# Patient Record
Sex: Female | Born: 1955
Health system: Southern US, Community
[De-identification: ages and names within clinical notes are randomized; demographics above are authoritative.]

## PROBLEM LIST (undated history)

## (undated) DIAGNOSIS — F419 Anxiety disorder, unspecified: Secondary | ICD-10-CM

## (undated) DIAGNOSIS — I10 Essential (primary) hypertension: Secondary | ICD-10-CM

## (undated) DIAGNOSIS — K219 Gastro-esophageal reflux disease without esophagitis: Secondary | ICD-10-CM

## (undated) DIAGNOSIS — Z9889 Other specified postprocedural states: Secondary | ICD-10-CM

## (undated) DIAGNOSIS — E039 Hypothyroidism, unspecified: Secondary | ICD-10-CM

## (undated) DIAGNOSIS — L509 Urticaria, unspecified: Secondary | ICD-10-CM

## (undated) DIAGNOSIS — R112 Nausea with vomiting, unspecified: Secondary | ICD-10-CM

## (undated) DIAGNOSIS — M1990 Unspecified osteoarthritis, unspecified site: Secondary | ICD-10-CM

## (undated) DIAGNOSIS — F32A Depression, unspecified: Secondary | ICD-10-CM

## (undated) DIAGNOSIS — J069 Acute upper respiratory infection, unspecified: Secondary | ICD-10-CM

## (undated) DIAGNOSIS — F329 Major depressive disorder, single episode, unspecified: Secondary | ICD-10-CM

## (undated) HISTORY — PX: UPPER GASTROINTESTINAL ENDOSCOPY: SHX188

## (undated) HISTORY — DX: Acute upper respiratory infection, unspecified: J06.9

## (undated) HISTORY — DX: Urticaria, unspecified: L50.9

## (undated) HISTORY — PX: TONSILLECTOMY: SUR1361

## (undated) HISTORY — PX: VAGINAL HYSTERECTOMY: SUR661

## (undated) HISTORY — PX: TUBAL LIGATION: SHX77

## (undated) HISTORY — PX: CHOLECYSTECTOMY: SHX55

## (undated) HISTORY — PX: BACK SURGERY: SHX140

## (undated) HISTORY — PX: COLONOSCOPY: SHX174

## (undated) HISTORY — DX: Anxiety disorder, unspecified: F41.9

## (undated) HISTORY — DX: Hypothyroidism, unspecified: E03.9

---

## 2001-06-15 ENCOUNTER — Other Ambulatory Visit: Admission: RE | Admit: 2001-06-15 | Discharge: 2001-06-15 | Payer: Self-pay | Admitting: *Deleted

## 2001-07-04 ENCOUNTER — Encounter: Payer: Self-pay | Admitting: Family Medicine

## 2001-07-04 ENCOUNTER — Ambulatory Visit (HOSPITAL_COMMUNITY): Admission: RE | Admit: 2001-07-04 | Discharge: 2001-07-04 | Payer: Self-pay | Admitting: Family Medicine

## 2001-09-14 ENCOUNTER — Ambulatory Visit (HOSPITAL_COMMUNITY): Admission: RE | Admit: 2001-09-14 | Discharge: 2001-09-14 | Payer: Self-pay | Admitting: Family Medicine

## 2001-09-14 ENCOUNTER — Encounter: Payer: Self-pay | Admitting: Family Medicine

## 2002-01-09 ENCOUNTER — Ambulatory Visit (HOSPITAL_COMMUNITY): Admission: RE | Admit: 2002-01-09 | Discharge: 2002-01-09 | Payer: Self-pay | Admitting: Family Medicine

## 2002-01-09 ENCOUNTER — Encounter: Payer: Self-pay | Admitting: Family Medicine

## 2002-04-17 ENCOUNTER — Ambulatory Visit (HOSPITAL_COMMUNITY): Admission: RE | Admit: 2002-04-17 | Discharge: 2002-04-17 | Payer: Self-pay | Admitting: General Surgery

## 2002-07-19 ENCOUNTER — Encounter: Payer: Self-pay | Admitting: Obstetrics and Gynecology

## 2002-07-19 ENCOUNTER — Ambulatory Visit (HOSPITAL_COMMUNITY): Admission: RE | Admit: 2002-07-19 | Discharge: 2002-07-19 | Payer: Self-pay | Admitting: Obstetrics and Gynecology

## 2003-11-27 ENCOUNTER — Other Ambulatory Visit: Admission: RE | Admit: 2003-11-27 | Discharge: 2003-11-27 | Payer: Self-pay | Admitting: Family Medicine

## 2004-12-25 ENCOUNTER — Encounter (HOSPITAL_COMMUNITY): Admission: RE | Admit: 2004-12-25 | Discharge: 2004-12-26 | Payer: Self-pay | Admitting: Endocrinology

## 2005-04-30 ENCOUNTER — Ambulatory Visit (HOSPITAL_COMMUNITY): Admission: RE | Admit: 2005-04-30 | Discharge: 2005-04-30 | Payer: Self-pay | Admitting: Endocrinology

## 2006-01-13 ENCOUNTER — Ambulatory Visit: Payer: Self-pay | Admitting: Internal Medicine

## 2006-01-25 ENCOUNTER — Ambulatory Visit: Payer: Self-pay | Admitting: Internal Medicine

## 2006-02-04 ENCOUNTER — Ambulatory Visit (HOSPITAL_COMMUNITY): Admission: RE | Admit: 2006-02-04 | Discharge: 2006-02-04 | Payer: Self-pay | Admitting: Internal Medicine

## 2006-02-04 ENCOUNTER — Encounter: Payer: Self-pay | Admitting: Internal Medicine

## 2006-02-04 ENCOUNTER — Ambulatory Visit: Payer: Self-pay | Admitting: Internal Medicine

## 2006-02-25 ENCOUNTER — Ambulatory Visit: Payer: Self-pay | Admitting: Internal Medicine

## 2006-04-28 ENCOUNTER — Ambulatory Visit: Payer: Self-pay | Admitting: Internal Medicine

## 2006-08-18 ENCOUNTER — Emergency Department (HOSPITAL_COMMUNITY): Admission: EM | Admit: 2006-08-18 | Discharge: 2006-08-18 | Payer: Self-pay | Admitting: Emergency Medicine

## 2007-01-05 ENCOUNTER — Ambulatory Visit (HOSPITAL_COMMUNITY): Admission: RE | Admit: 2007-01-05 | Discharge: 2007-01-05 | Payer: Self-pay | Admitting: Family Medicine

## 2007-09-14 ENCOUNTER — Ambulatory Visit (HOSPITAL_COMMUNITY): Admission: RE | Admit: 2007-09-14 | Discharge: 2007-09-14 | Payer: Self-pay | Admitting: Family Medicine

## 2007-09-21 ENCOUNTER — Ambulatory Visit (HOSPITAL_COMMUNITY): Admission: RE | Admit: 2007-09-21 | Discharge: 2007-09-21 | Payer: Self-pay | Admitting: Family Medicine

## 2007-10-15 ENCOUNTER — Ambulatory Visit (HOSPITAL_COMMUNITY): Admission: RE | Admit: 2007-10-15 | Discharge: 2007-10-15 | Payer: Self-pay | Admitting: Family Medicine

## 2007-10-25 ENCOUNTER — Ambulatory Visit (HOSPITAL_COMMUNITY): Admission: RE | Admit: 2007-10-25 | Discharge: 2007-10-26 | Payer: Self-pay | Admitting: Neurosurgery

## 2008-07-26 ENCOUNTER — Emergency Department (HOSPITAL_COMMUNITY): Admission: EM | Admit: 2008-07-26 | Discharge: 2008-07-26 | Payer: Self-pay | Admitting: Emergency Medicine

## 2008-10-24 ENCOUNTER — Ambulatory Visit (HOSPITAL_COMMUNITY): Admission: RE | Admit: 2008-10-24 | Discharge: 2008-10-24 | Payer: Self-pay | Admitting: Internal Medicine

## 2009-06-26 ENCOUNTER — Ambulatory Visit (HOSPITAL_COMMUNITY): Admission: RE | Admit: 2009-06-26 | Discharge: 2009-06-26 | Payer: Self-pay | Admitting: Family Medicine

## 2009-11-01 ENCOUNTER — Emergency Department (HOSPITAL_COMMUNITY): Admission: EM | Admit: 2009-11-01 | Discharge: 2009-11-02 | Payer: Self-pay | Admitting: Emergency Medicine

## 2009-12-10 ENCOUNTER — Ambulatory Visit (HOSPITAL_COMMUNITY): Admission: RE | Admit: 2009-12-10 | Discharge: 2009-12-10 | Payer: Self-pay | Admitting: Family Medicine

## 2010-12-28 ENCOUNTER — Encounter: Payer: Self-pay | Admitting: Endocrinology

## 2011-01-15 ENCOUNTER — Emergency Department (HOSPITAL_COMMUNITY)
Admission: EM | Admit: 2011-01-15 | Discharge: 2011-01-15 | Disposition: A | Discharge status: Home / Self Care | Payer: PRIVATE HEALTH INSURANCE | Attending: Emergency Medicine | Admitting: Emergency Medicine

## 2011-01-15 ENCOUNTER — Emergency Department (HOSPITAL_COMMUNITY): Payer: PRIVATE HEALTH INSURANCE

## 2011-01-15 DIAGNOSIS — M25579 Pain in unspecified ankle and joints of unspecified foot: Secondary | ICD-10-CM | POA: Insufficient documentation

## 2011-01-15 DIAGNOSIS — Y9269 Other specified industrial and construction area as the place of occurrence of the external cause: Secondary | ICD-10-CM | POA: Insufficient documentation

## 2011-01-15 DIAGNOSIS — M25539 Pain in unspecified wrist: Secondary | ICD-10-CM | POA: Insufficient documentation

## 2011-01-15 DIAGNOSIS — I10 Essential (primary) hypertension: Secondary | ICD-10-CM | POA: Insufficient documentation

## 2011-01-15 DIAGNOSIS — W010XXA Fall on same level from slipping, tripping and stumbling without subsequent striking against object, initial encounter: Secondary | ICD-10-CM | POA: Insufficient documentation

## 2011-01-15 DIAGNOSIS — S60219A Contusion of unspecified wrist, initial encounter: Secondary | ICD-10-CM | POA: Insufficient documentation

## 2011-01-15 DIAGNOSIS — X500XXA Overexertion from strenuous movement or load, initial encounter: Secondary | ICD-10-CM | POA: Insufficient documentation

## 2011-01-15 DIAGNOSIS — S93409A Sprain of unspecified ligament of unspecified ankle, initial encounter: Secondary | ICD-10-CM | POA: Insufficient documentation

## 2011-01-15 DIAGNOSIS — M25549 Pain in joints of unspecified hand: Secondary | ICD-10-CM | POA: Insufficient documentation

## 2011-01-15 DIAGNOSIS — Z79899 Other long term (current) drug therapy: Secondary | ICD-10-CM | POA: Insufficient documentation

## 2011-01-15 DIAGNOSIS — M545 Low back pain, unspecified: Secondary | ICD-10-CM | POA: Insufficient documentation

## 2011-02-21 LAB — CREATININE, SERUM: GFR calc non Af Amer: >60 mL/min (ref >60)

## 2011-04-21 NOTE — Op Note (Signed)
[name], [name] NO.:  [id number]   MEDICAL RECORD NO.:  [id number]          PATIENT TYPE:  OIB   LOCATION:  3021                         FACILITY:  [facility]   PHYSICIAN:  Clydene Fake, M.D.  DATE OF BIRTH:  October 02, 1956   DATE OF PROCEDURE:  10/25/2007  DATE OF DISCHARGE:  10/26/2007                               OPERATIVE REPORT   DIAGNOSIS:  Herniated nucleus pulposus, left, L5-S1.   POSTOPERATIVE DIAGNOSIS:  Herniated nucleus pulposus, left, L5-S1.   PROCEDURE:  Left L5-S1 semi-hemilaminectomy and diskectomy,  microdissection with microscope.   SURGEON:  Clydene Fake, M.D.   ASSISTANT:  Danae Orleans. Venetia Maxon, M.D.   ANESTHESIA:  General endotracheal tube anesthesia.   ESTIMATED BLOOD LOSS:  Minimal.   BLOOD GIVEN:  None.   DRAINS:  None.   COMPLICATIONS:  None.   REASON FOR PROCEDURE:  The patient is a 55 year old woman with back and  left leg pain and numbness.  MRI was done, showing extremely large disk  herniation with extruded fragment on the left side, L5-S1, compressing  the left S1 root and the patient is brought in for decompression.   PROCEDURE IN DETAIL:  The patient was brought into the operating and  general anesthesia induced.  The patient was placed in a prone position  on the [place]ham frame and pressure points padded.  The patient was prepped  and draped in a sterile fashion.  Site of incision was injected with 10  mL of 1% lidocaine with epinephrine.  An incision was then made.  A  needle was placed in the interspace and x-rays were obtained showing  this was pointing at the 5-1 interspace.  An incision was then made,  centered where the needle was, and incision taken down to fascia.  Hemostasis was obtained with Bovie cauterization.  The fascia was  incised with the Bovie and subperiosteal dissection was done over the L5  and S1 spinous processes and lamina, out to the facet.  Self-retaining  retractors were placed and a marker  was placed in the interspace.  Another x-ray was obtained, confirming our positioning at L5-S1.  Microscope was brought in for microdissection and a high-speed drill was  used and the semi-hemilaminectomy completed with Kerrison punches and  the top of the sacrum was also removed.  I explored the epidural space;  a large disk herniation was seen and we started to remove this in large  pieces.  As we did, we could tell that the disk herniation was lateral  to the thecal sac and nerve root and we removed disk that was following  the nerve root out caudally.  Once we got good decompression, we could  use a nerve retractor to reflect the dura and nerve root medially.  We  found some more fragments of disk and removed those.  I then saw a hole  within the bottom edge of the annulus with disk protruding through that.  We opened the disk space and continued the diskectomy with pituitary  rongeurs and curettes.  When we were finished, we had good decompression  of the central canal and the S1 nerve root.  We checked the L5 root up  above and that was well decompressed.  We could not find any more  fragments of disk.  We got hemostasis with bipolar cauterization.  We  irrigated with antibiotic solution.  We had good hemostasis.  Retractors  were removed.  Fascia was closed with 0 Vicryl in interrupted sutures,  subcutaneous tissue closed with 0, 2-0 and 3-0 Vicryl interrupted  sutures and skin closed with benzoin and Steri-Strips.  Dressing was  placed.  The patient was placed back in the supine position, awoken from  anesthesia and transferred to the recovery room in stable condition.           ______________________________  Clydene Fake, M.D.     JRH/MEDQ  D:  10/25/2007  T:  10/26/2007  Job:  [id number]

## 2011-04-24 NOTE — H&P (Signed)
[name], [name] NO.:  [id number]   MEDICAL RECORD NO.:  [id number]           PATIENT TYPE:   LOCATION:                                 FACILITY:   PHYSICIAN:  Dalia Heading, M.D.  DATE OF BIRTH:  December 20, 1955   DATE OF ADMISSION:  01/12/2006  DATE OF DISCHARGE:  LH                                HISTORY & PHYSICAL   CHIEF COMPLAINT:  Nonspecific abdominal pain.   HISTORY OF PRESENT ILLNESS:  The patient is a 55 year old white female who  is referred for endoscopic evaluation.  She needs a colonoscopy for  nonspecific abdominal pain.  She has had intermittent abdominal pain and  constipation for many years.  MiraLax has been only mildly helpful.  No  weight loss, nausea, vomiting, diarrhea, melena or hematochezia have been  noted.  She has never had a colonoscopy.  There was no family history of  colon carcinoma.   PAST MEDICAL HISTORY:  1.  Hypothyroidism.  2.  Gastroesophageal reflux disease.  3.  Hypertension.   PAST SURGICAL HISTORY:  1.  Tubal ligation.  2.  Hysterectomy.  3.  Cholecystectomy.   CURRENT MEDICATIONS:  1.  Lisinopril.  2.  Synthroid.  3.  Prilosec.  4.  Pravachol.  5.  __________.  6.  MiraLax.   ALLERGIES:  1.  CODEINE.  2.  ROBITUSSIN.  3.  PENICILLIN.   REVIEW OF SYSTEMS:  Noncontributory.   PHYSICAL EXAMINATION:  GENERAL:  The patient is a well-developed, well-  nourished, white female in no acute distress.  LUNGS:  Clear to auscultation  with equal breath sounds bilaterally.  HEART:  Regular rate and rhythm  without S3, S4 or murmurs.  ABDOMEN:  Soft and nontender with some fullness  noted.  No hepatosplenomegaly or masses are noted.  RECTAL:  Deferred to the  procedure.   IMPRESSION:  Nonspecific abdominal pain.   PLAN:  The patient is scheduled for a colonoscopy on January 12, 2006.  The  risks and benefits of the procedure including bleeding and perforation were  fully explained to the patient, who gave  informed consent.      Dalia Heading, M.D.  Electronically Signed     MAJ/MEDQ  D:  12/31/2005  T:  01/01/2006  Job:  [id number]   cc:   [name], M.D.  Fax: [phone]

## 2011-04-24 NOTE — Op Note (Signed)
NAME:  STARIA, BIRKHEAD                ACCOUNT NO.:  [id number]   MEDICAL RECORD NO.:  [id number]          PATIENT TYPE:  AMB   LOCATION:  DAY                           FACILITY:  [facility]   PHYSICIAN:  R. Roetta Sessions, M.D. DATE OF BIRTH:  [birth year]   DATE OF PROCEDURE:  02/04/2006  DATE OF DISCHARGE:                                 OPERATIVE REPORT   PROCEDURE:  Esophagogastroduodenoscopy with biopsy.   INDICATIONS FOR PROCEDURE:  A 55 year old lady with long-standing  gastroesophageal reflux disease symptoms and constipation found to be  Hemoccult positive recently. EGD and colonoscopy are now being done. This  approach has been discussed with the patient at length. Potential risks,  benefits, and alternatives have been reviewed and questions answered. She is  agreeable. Please see documentation in the medical record.   PROCEDURE NOTE:  O2 saturation, blood pressure, pulse, and respirations were  monitored throughout the entire procedure. Conscious sedation with Versed 5  mg IV and Demerol 75 mg IV in divided doses.   INSTRUMENT:  Olympus video chip system.   FINDINGS:  Examination of the tubular esophagus revealed no mucosa  abnormalities. EG junction easily traversed.   Stomach:  Gastric cavity was empty and insufflated well with air. Thorough  examination of gastric mucosa including retroflexed view of the proximal  stomach and esophagogastric junction demonstrated a 4-mm polyp in the  cardia. Gastric mucosa otherwise appeared normal. Pylorus patent and easily  traversed. Examination of bulb and second portion revealed no abnormalities.   THERAPEUTIC/DIAGNOSTIC MANEUVERS:  The polyp in the cardia was cold  biopsied/removed.   The patient tolerated the procedure well and was prepared for colonoscopy.  Digital rectal revealed no abnormalities.   ENDOSCOPIC FINDINGS:  Prep was fair.   Rectum:  Examination of the rectal mucosa including retroflexed view of the  anal verge  revealed only internal hemorrhoids.   Colon:  Colonic mucosa was surveyed from the rectosigmoid junction through  the left, transverse, and right colon to the area of the appendiceal  orifice, ileocecal valve, and cecum. These structures were well seen and  photographed for the record. From the level of the cecum and ileocecal  valve, the scope was slowly and cautiously withdrawn, and all previously  mentioned mucosal surfaces were again seen. The patient was noted to have  numerous left sided diverticula. The remainder of the colonic mucosa  appeared normal. The patient tolerated the procedure well and was reactive  to endoscopy.   IMPRESSION:  Esophagogastroduodenoscopy:  Normal esophagus. Polyp at the  cardia, cold biopsied/removed. The remainder of the gastric mucosa appeared  normal. Normal D1 and D2.   Colonoscopy findings:  Internal hemorrhoids. Otherwise normal rectum. Left  sided diverticulum. The remainder of the colonic mucosa appeared normal.   RECOMMENDATIONS:  1.  Stop omeprazole. Begin Aciphex 20 mg orally daily. Anti-reflux      literature provided to Ms. Gunther.  2.  Daily fiber supplementation. Diverticulosis literature given to Ms.      Pretlow.  3.  In addition to Zelnorm 6 mg orally b.i.d., add MiraLax 17 g orally  at      bedtime p.r.n. constipation.  4.  Follow up on pathology.  5.  Followup appointment with [name] in one month so we can see how she is      doing.      Jonathon Bellows, M.D.  Electronically Signed     RMR/MEDQ  D:  02/04/2006  T:  02/05/2006  Job:  [id number]   cc:   Kirk Ruths, M.D.  Fax: [phone]

## 2011-04-24 NOTE — H&P (Signed)
[facility]  Patient:    [name], AMEND Visit Number: [id number] MRN: [id number]          Service Type: Attending:  Franky Macho, M.D. Dictated by:   Franky Macho, M.D. Adm. Date:  04/17/02   CC:         Karleen Hampshire, M.D.   History and Physical  DATE OF BIRTH:  November 28, 1956  CHIEF COMPLAINT:  Intractable gastroesophageal reflux disease.  HISTORY OF PRESENT ILLNESS:  The patient is a 55 year old white female who is referred for evaluation and treatment of gastroesophageal reflux disease.  She has been having nausea, bloating, and heartburn for the past two months.  She has a known history of a hiatal hernia diagnosed by EGD in the remote past. She has been on Aciphex and Protonix without significant relief.  She was H. pylori-positive in the past but did not tolerate two regimens of treatment. No vomiting, hematemesis, or epigastric is noted.  PAST MEDICAL HISTORY: 1. GERD. 2. Hypertension.  PAST SURGICAL HISTORY: 1. Tubal ligation. 2. Hysterectomy. 3. EGD. 4. Cholecystitis.  CURRENT MEDICATIONS:  Levothroid, lisinopril, Premarin, Aciphex, Protonix.  ALLERGIES:  CODEINE, ROBITUSSIN, PENICILLIN.  REVIEW OF SYSTEMS:  Unremarkable.  PHYSICAL EXAMINATION:  GENERAL:  Well-developed, well-nourished white female in no acute distress.  VITAL SIGNS:  Afebrile, vital signs stable.  HEENT:  No scleral icterus.  LUNGS:  Clear to auscultation, with equal breath sounds bilaterally.  HEART:  Regular rate and rhythm without S3, S4, or murmurs.  ABDOMEN:  Soft, nontender, nondistended.  No hepatosplenomegaly, masses, or hernias are identified.  IMPRESSION:  Intractable gastroesophageal reflux disease.  PLAN:  The patient is scheduled for an EGD on Apr 17, 2002.  The risks and benefits of the procedure including bleeding and perforation were fully explained to the patient, who gave informed consent. Dictated by:   Franky Macho,  M.D. Attending:  Franky Macho, M.D. DD:  04/13/02 TD:  04/15/02 Job: [id number]

## 2011-04-24 NOTE — Op Note (Signed)
[name], [name]                ACCOUNT NO.:  [id number]   MEDICAL RECORD NO.:  [id number]          PATIENT TYPE:  AMB   LOCATION:  DAY                           FACILITY:  [facility]   PHYSICIAN:  Lionel December, M.D.    DATE OF BIRTH:  1955-12-11   DATE OF PROCEDURE:  DATE OF DISCHARGE:  10/24/2008                               OPERATIVE REPORT   DATE OF PROCEDURE:  October 24, 2008   PROCEDURE:  Hydrogen breath test for small bowel bacterial overgrowth.   INDICATION:  [name] is a 55 year old Caucasian female with intractable  bloating, who has undergone multiple studies including EGD, colonoscopy  as well as small bowel follow through and testing for celiac disease.  Several weeks ago, she was treated with Xifaxan and noted significant  transient improvement.  It is suspected she may have small bowel  bacterial overgrowth and undergoing this test.  Informed consent was  obtained from the patient.   PROCEDURE:  The patient met all the criteria for this procedure.   She was given 25 mg of lactulose dissolved in 8 ounces of water.   Alveolar air sample was collected every 15 minutes and analyzed until  after the second peak period was obtained for 2 hours and 15 minutes.   First peak was noted at 30 minutes with concentration of 25 parts per  million of hydrogen.  Second peak was seen at 90 minutes with a count of  51 ppm, and another peak was noted at 2 hours, which was 48 parts per  million.   IMPRESSION:  Alveolar hydrogen concentration of 25 parts per million at  30 minutes is indicative of small bowel bacterial overgrowth.   Second and third peaks are secondary to colonic bacterial activity.   RECOMMENDATIONS:  Findings are reviewed with the patient over the phone.  She will be treated with tetracycline 500 mg p.o. t.i.d. for 2 weeks.  She has an appointment next week to review her symptoms.      Lionel December, M.D.  Electronically Signed     NR/MEDQ  D:   11/07/2008  T:  11/08/2008  Job:  [id number]   cc:   Corrie Mckusick, M.D.  Fax: [phone]

## 2011-09-15 LAB — URINALYSIS, ROUTINE W REFLEX MICROSCOPIC
Bilirubin Urine: NEGATIVE
Glucose, UA: NEGATIVE
Ketones, ur: NEGATIVE
Nitrite: NEGATIVE
Protein, ur: NEGATIVE

## 2011-09-15 LAB — BASIC METABOLIC PANEL
BUN: 8
CO2: 27
Glucose, Bld: 127 — ABNORMAL HIGH
Sodium: 137

## 2011-09-15 LAB — CBC
HCT: 42.8
MCHC: 33.9
MCV: 99.9
Platelets: 383
RBC: 4.28

## 2012-04-20 ENCOUNTER — Encounter: Payer: Self-pay | Admitting: Orthopedic Surgery

## 2012-04-20 ENCOUNTER — Ambulatory Visit (INDEPENDENT_AMBULATORY_CARE_PROVIDER_SITE_OTHER): Payer: 59 | Admitting: Orthopedic Surgery

## 2012-04-20 VITALS — BP 124/80 | Ht 61.0 in | Wt 170.0 lb

## 2012-04-20 DIAGNOSIS — G5600 Carpal tunnel syndrome, unspecified upper limb: Secondary | ICD-10-CM

## 2012-04-20 DIAGNOSIS — M6530 Trigger finger, unspecified finger: Secondary | ICD-10-CM

## 2012-04-20 NOTE — Patient Instructions (Signed)
Referral to hand specialist for bilateral carpal tunnel and right long finger triggering  [facility]

## 2012-04-21 ENCOUNTER — Encounter: Payer: Self-pay | Admitting: Orthopedic Surgery

## 2012-04-21 DIAGNOSIS — G5600 Carpal tunnel syndrome, unspecified upper limb: Secondary | ICD-10-CM | POA: Insufficient documentation

## 2012-04-21 DIAGNOSIS — M6530 Trigger finger, unspecified finger: Secondary | ICD-10-CM | POA: Insufficient documentation

## 2012-04-21 NOTE — Progress Notes (Signed)
  Subjective:    [name] is an 56 y.o. female who presents for evaluation of the right hand and right long finger.  Referring physician Dr.-Luking  Symptoms started in 2007 she was evaluated with nerve conduction studies which showed she had moderate bilateral carpal tunnel syndrome and left cubital tunnel syndrome. She indicates that she was told that she had a 50-50 chance of improving so she could not have the surgery and she is basically lived with that for the last 6 years. She has a catching locking sensation of the right long finger and she denies any previous injury or treatment for either problem  Symptoms currently include throbbing, 5/10 pain which is constant. It is worse when she is sleeping. She has numbness and tingling of the right and left upper extremity and locking of the right long finger.  Review of systems she reports weight gain and fatigue some seasonal allergies but otherwise has a negative system review and the following medical history.  History reviewed. No pertinent past medical history.  Past Surgical History  Procedure Date  . Cholecystectomy   . Tubal ligation   . Vaginal hysterectomy   . Tonsillectomy   . Back surgery     Vital signs are stable as recorded  General appearance is normal  The patient is alert and oriented x3  The patient's mood and affect are normal  Gait assessment: Normal The cardiovascular exam reveals normal pulses and temperature without edema swelling.  The lymphatic system is negative for palpable lymph nodes  The sensory exam see dictation below There are no pathologic reflexes.  Balance is normal.   Exam of the right and left upper extremity Inspection both extremities are swollen in the hand and wrist area Range of motion active range of motion is normal with a catching sensation noted in the right long finger Stability Watson's test are negative Strength grip strength is weak but inability to fully flex  the fingers Skin warm dry and intact without rash  The most notable finding is the triggering of the right long finger and the numbness and tingling of the right and left hand   Assessment:    Carpal tunnel syndrome and Right long finger triggering on Right and left for the carpal tunnel and right only involving the long finger for the triggering   Plan:    Orthopedics referral. To a hand surgeon. Based on the long-standing nature of her carpal tunnel symptoms I am reluctant to perform carpal tunnel release on her. She will also need treatment of her trigger finger. I think this would be best handled by a hand specialist.

## 2012-04-22 ENCOUNTER — Other Ambulatory Visit: Payer: Self-pay | Admitting: *Deleted

## 2012-04-22 DIAGNOSIS — M79641 Pain in right hand: Secondary | ICD-10-CM

## 2012-04-25 ENCOUNTER — Encounter: Payer: Self-pay | Admitting: *Deleted

## 2012-04-25 NOTE — Progress Notes (Signed)
Patient ID: [name], female   DOB: June 07, 1956, 56 y.o.   MRN: [id number] A referral has been sent to [facility] to see Dr Amanda Pea Faxed 04/22/12 Adella Hare, LPN

## 2012-05-09 ENCOUNTER — Other Ambulatory Visit: Payer: Self-pay | Admitting: Orthopedic Surgery

## 2012-05-17 ENCOUNTER — Other Ambulatory Visit (INDEPENDENT_AMBULATORY_CARE_PROVIDER_SITE_OTHER): Payer: Self-pay | Admitting: Internal Medicine

## 2012-05-17 NOTE — Telephone Encounter (Signed)
Patient needs OV.

## 2012-06-14 ENCOUNTER — Encounter (HOSPITAL_BASED_OUTPATIENT_CLINIC_OR_DEPARTMENT_OTHER): Payer: Self-pay | Admitting: *Deleted

## 2012-06-14 NOTE — Progress Notes (Signed)
To come in for bmet-ekg- Works at [employer] [facility] of [employer]

## 2012-06-15 ENCOUNTER — Encounter (HOSPITAL_BASED_OUTPATIENT_CLINIC_OR_DEPARTMENT_OTHER)
Admission: RE | Admit: 2012-06-15 | Discharge: 2012-06-15 | Disposition: A | Discharge status: Home / Self Care | Payer: 59 | Source: Ambulatory Visit | Attending: Orthopedic Surgery | Admitting: Orthopedic Surgery

## 2012-06-15 ENCOUNTER — Other Ambulatory Visit: Payer: Self-pay

## 2012-06-15 LAB — BASIC METABOLIC PANEL
BUN: 15 mg/dL (ref 6–23)
CO2: 24 mEq/L (ref 19–32)
Chloride: 102 mEq/L (ref 96–112)
GFR calc non Af Amer: 73 mL/min — ABNORMAL LOW (ref >90)
Glucose, Bld: 107 mg/dL — ABNORMAL HIGH (ref 70–99)
Potassium: 4 mEq/L (ref 3.5–5.1)
Sodium: 138 mEq/L (ref 135–145)

## 2012-06-16 ENCOUNTER — Encounter (HOSPITAL_BASED_OUTPATIENT_CLINIC_OR_DEPARTMENT_OTHER): Payer: Self-pay | Admitting: *Deleted

## 2012-06-16 ENCOUNTER — Ambulatory Visit (HOSPITAL_BASED_OUTPATIENT_CLINIC_OR_DEPARTMENT_OTHER)
Admission: RE | Admit: 2012-06-16 | Discharge: 2012-06-16 | Disposition: A | Discharge status: Home / Self Care | Payer: 59 | Source: Ambulatory Visit | Attending: Orthopedic Surgery | Admitting: Orthopedic Surgery

## 2012-06-16 ENCOUNTER — Ambulatory Visit (HOSPITAL_BASED_OUTPATIENT_CLINIC_OR_DEPARTMENT_OTHER): Payer: 59 | Admitting: Anesthesiology

## 2012-06-16 ENCOUNTER — Encounter (HOSPITAL_BASED_OUTPATIENT_CLINIC_OR_DEPARTMENT_OTHER): Payer: Self-pay | Admitting: Anesthesiology

## 2012-06-16 ENCOUNTER — Encounter (HOSPITAL_BASED_OUTPATIENT_CLINIC_OR_DEPARTMENT_OTHER)
Admission: RE | Disposition: A | Discharge status: Home / Self Care | Payer: Self-pay | Source: Ambulatory Visit | Attending: Orthopedic Surgery

## 2012-06-16 DIAGNOSIS — E039 Hypothyroidism, unspecified: Secondary | ICD-10-CM | POA: Insufficient documentation

## 2012-06-16 DIAGNOSIS — F3289 Other specified depressive episodes: Secondary | ICD-10-CM | POA: Insufficient documentation

## 2012-06-16 DIAGNOSIS — M129 Arthropathy, unspecified: Secondary | ICD-10-CM | POA: Insufficient documentation

## 2012-06-16 DIAGNOSIS — I10 Essential (primary) hypertension: Secondary | ICD-10-CM | POA: Insufficient documentation

## 2012-06-16 DIAGNOSIS — G5600 Carpal tunnel syndrome, unspecified upper limb: Secondary | ICD-10-CM | POA: Insufficient documentation

## 2012-06-16 DIAGNOSIS — M6530 Trigger finger, unspecified finger: Secondary | ICD-10-CM

## 2012-06-16 DIAGNOSIS — F329 Major depressive disorder, single episode, unspecified: Secondary | ICD-10-CM | POA: Insufficient documentation

## 2012-06-16 DIAGNOSIS — Z01812 Encounter for preprocedural laboratory examination: Secondary | ICD-10-CM | POA: Insufficient documentation

## 2012-06-16 DIAGNOSIS — Z01810 Encounter for preprocedural cardiovascular examination: Secondary | ICD-10-CM | POA: Insufficient documentation

## 2012-06-16 DIAGNOSIS — K219 Gastro-esophageal reflux disease without esophagitis: Secondary | ICD-10-CM | POA: Insufficient documentation

## 2012-06-16 DIAGNOSIS — M65839 Other synovitis and tenosynovitis, unspecified forearm: Secondary | ICD-10-CM | POA: Insufficient documentation

## 2012-06-16 HISTORY — DX: Gastro-esophageal reflux disease without esophagitis: K21.9

## 2012-06-16 HISTORY — DX: Essential (primary) hypertension: I10

## 2012-06-16 HISTORY — DX: Unspecified osteoarthritis, unspecified site: M19.90

## 2012-06-16 HISTORY — DX: Other specified postprocedural states: Z98.890

## 2012-06-16 HISTORY — PX: CARPAL TUNNEL RELEASE: SHX101

## 2012-06-16 HISTORY — DX: Major depressive disorder, single episode, unspecified: F32.9

## 2012-06-16 HISTORY — DX: Nausea with vomiting, unspecified: R11.2

## 2012-06-16 HISTORY — DX: Depression, unspecified: F32.A

## 2012-06-16 HISTORY — DX: Hypothyroidism, unspecified: E03.9

## 2012-06-16 HISTORY — PX: TRIGGER FINGER RELEASE: SHX641

## 2012-06-16 SURGERY — CARPAL TUNNEL RELEASE
Anesthesia: Monitor Anesthesia Care | Site: Hand | Laterality: Right

## 2012-06-16 MED ORDER — MIDAZOLAM HCL 5 MG/5ML IJ SOLN
INTRAMUSCULAR | Status: DC | PRN
  Administered 2012-06-16: 1 mg via INTRAVENOUS

## 2012-06-16 MED ORDER — FENTANYL CITRATE 0.05 MG/ML IJ SOLN
INTRAMUSCULAR | Status: DC | PRN
  Administered 2012-06-16: 50 ug via INTRAVENOUS

## 2012-06-16 MED ORDER — LACTATED RINGERS IV SOLN
INTRAVENOUS | Status: DC
  Administered 2012-06-16: 14:00:00 via INTRAVENOUS

## 2012-06-16 MED ORDER — ONDANSETRON HCL 4 MG/2ML IJ SOLN
INTRAMUSCULAR | Status: DC | PRN
  Administered 2012-06-16: 4 mg via INTRAVENOUS

## 2012-06-16 MED ORDER — FENTANYL CITRATE 0.05 MG/ML IJ SOLN: 25.0000 ug | INTRAMUSCULAR | Status: DC | PRN

## 2012-06-16 MED ORDER — CHLORHEXIDINE GLUCONATE 4 % EX LIQD: 60.0000 mL | Freq: Once | CUTANEOUS | Status: DC

## 2012-06-16 MED ORDER — SODIUM BICARBONATE 4 % IV SOLN
INTRAVENOUS | Status: DC | PRN
  Administered 2012-06-16: 2 mL via INTRAVENOUS

## 2012-06-16 MED ORDER — BUPIVACAINE HCL (PF) 0.25 % IJ SOLN
INTRAMUSCULAR | Status: DC | PRN
  Administered 2012-06-16: 12 mL

## 2012-06-16 MED ORDER — HYDROCODONE-ACETAMINOPHEN 5-325 MG PO TABS: 2.0000 | ORAL_TABLET | Freq: Four times a day (QID) | ORAL | Status: AC | PRN

## 2012-06-16 MED ORDER — SODIUM CHLORIDE 0.45 % IV SOLN: INTRAVENOUS | Status: DC

## 2012-06-16 MED ORDER — CLINDAMYCIN PHOSPHATE 600 MG/50ML IV SOLN
600.0000 mg | INTRAVENOUS | Status: AC
  Administered 2012-06-16: 600 mg via INTRAVENOUS

## 2012-06-16 MED ORDER — LIDOCAINE HCL (PF) 1 % IJ SOLN
INTRAMUSCULAR | Status: DC | PRN
  Administered 2012-06-16: 12 mL

## 2012-06-16 SURGICAL SUPPLY — 57 items
BANDAGE CONFORM 3  STR LF (GAUZE/BANDAGES/DRESSINGS) ×3 IMPLANT
BANDAGE ELASTIC 3 VELCRO ST LF (GAUZE/BANDAGES/DRESSINGS) ×3 IMPLANT
BENZOIN TINCTURE PRP APPL 2/3 (GAUZE/BANDAGES/DRESSINGS) ×3 IMPLANT
BLADE CARPAL TUNNEL SNGL USE (BLADE) ×6 IMPLANT
BLADE SURG 15 STRL LF DISP TIS (BLADE) ×4 IMPLANT
BLADE SURG 15 STRL SS (BLADE) ×2
BNDG PLASTER X FAST 3X3 WHT LF (CAST SUPPLIES) ×3 IMPLANT
BRUSH SCRUB EZ PLAIN DRY (MISCELLANEOUS) ×3 IMPLANT
CLOTH BEACON ORANGE TIMEOUT ST (SAFETY) ×3 IMPLANT
CORDS BIPOLAR (ELECTRODE) ×3 IMPLANT
COVER MAYO STAND STRL (DRAPES) ×3 IMPLANT
COVER TABLE BACK 60X90 (DRAPES) ×3 IMPLANT
CUFF TOURNIQUET SINGLE 18IN (TOURNIQUET CUFF) ×3 IMPLANT
DRAIN PENROSE 1/4X12 LTX STRL (WOUND CARE) IMPLANT
DRAPE EXTREMITY T 121X128X90 (DRAPE) ×3 IMPLANT
DRAPE SURG 17X23 STRL (DRAPES) ×3 IMPLANT
DRSG EMULSION OIL 3X3 NADH (GAUZE/BANDAGES/DRESSINGS) ×3 IMPLANT
GAUZE SPONGE 4X4 16PLY XRAY LF (GAUZE/BANDAGES/DRESSINGS) IMPLANT
GAUZE XEROFORM 1X8 LF (GAUZE/BANDAGES/DRESSINGS) ×3 IMPLANT
GLOVE BIO SURGEON STRL SZ 6.5 (GLOVE) ×3 IMPLANT
GLOVE BIOGEL M STRL SZ7.5 (GLOVE) ×6 IMPLANT
GLOVE BIOGEL PI IND STRL 8 (GLOVE) ×2 IMPLANT
GLOVE BIOGEL PI INDICATOR 8 (GLOVE) ×1
GLOVE SS BIOGEL STRL SZ 8 (GLOVE) ×2 IMPLANT
GLOVE SUPERSENSE BIOGEL SZ 8 (GLOVE) ×1
GOWN PREVENTION PLUS XLARGE (GOWN DISPOSABLE) ×3 IMPLANT
GOWN PREVENTION PLUS XXLARGE (GOWN DISPOSABLE) ×6 IMPLANT
KNIFE CARPAL TUNNEL (BLADE) IMPLANT
LOOP VESSEL MAXI BLUE (MISCELLANEOUS) IMPLANT
NDL SAFETY ECLIPSE 18X1.5 (NEEDLE) ×2 IMPLANT
NEEDLE HYPO 18GX1.5 SHARP (NEEDLE) ×1
NEEDLE HYPO 22GX1.5 SAFETY (NEEDLE) ×3 IMPLANT
NEEDLE HYPO 25X1 1.5 SAFETY (NEEDLE) ×3 IMPLANT
NS IRRIG 1000ML POUR BTL (IV SOLUTION) ×3 IMPLANT
PACK BASIN DAY SURGERY FS (CUSTOM PROCEDURE TRAY) ×3 IMPLANT
PAD ALCOHOL SWAB (MISCELLANEOUS) ×24 IMPLANT
PAD CAST 3X4 CTTN HI CHSV (CAST SUPPLIES) ×4 IMPLANT
PADDING CAST ABS 3INX4YD NS (CAST SUPPLIES)
PADDING CAST ABS 4INX4YD NS (CAST SUPPLIES) ×1
PADDING CAST ABS COTTON 3X4 (CAST SUPPLIES) IMPLANT
PADDING CAST ABS COTTON 4X4 ST (CAST SUPPLIES) ×2 IMPLANT
PADDING CAST COTTON 3X4 STRL (CAST SUPPLIES) ×2
SPLINT PLASTER CAST XFAST 3X15 (CAST SUPPLIES) IMPLANT
SPLINT PLASTER XTRA FASTSET 3X (CAST SUPPLIES)
SPONGE GAUZE 4X4 12PLY (GAUZE/BANDAGES/DRESSINGS) IMPLANT
STOCKINETTE 4X48 STRL (DRAPES) ×3 IMPLANT
STOCKINETTE TUBULAR COTT 4X25 (GAUZE/BANDAGES/DRESSINGS) IMPLANT
STOCKINETTE TUBULAR COTTON 2IN (GAUZE/BANDAGES/DRESSINGS) IMPLANT
STRIP CLOSURE SKIN 1/2X4 (GAUZE/BANDAGES/DRESSINGS) ×3 IMPLANT
SUT PROLENE 4 0 PS 2 18 (SUTURE) ×3 IMPLANT
SYR BULB 3OZ (MISCELLANEOUS) ×3 IMPLANT
SYR CONTROL 10ML LL (SYRINGE) ×6 IMPLANT
TOWEL OR 17X24 6PK STRL BLUE (TOWEL DISPOSABLE) ×3 IMPLANT
TOWEL OR NON WOVEN STRL DISP B (DISPOSABLE) ×3 IMPLANT
TRAY DSU PREP LF (CUSTOM PROCEDURE TRAY) ×3 IMPLANT
UNDERPAD 30X30 INCONTINENT (UNDERPADS AND DIAPERS) ×3 IMPLANT
WATER STERILE IRR 1000ML POUR (IV SOLUTION) ×3 IMPLANT

## 2012-06-16 NOTE — Anesthesia Procedure Notes (Signed)
Procedure Name: MAC Date/Time: 06/16/2012 1:38 PM Performed by: Meyer Russel Pre-anesthesia Checklist: Patient identified, Emergency Drugs available, Suction available and Patient being monitored Patient Re-evaluated:Patient Re-evaluated prior to inductionOxygen Delivery Method: Simple face mask Preoxygenation: Pre-oxygenation with 100% oxygen Intubation Type: IV induction

## 2012-06-16 NOTE — Anesthesia Postprocedure Evaluation (Signed)
Anesthesia Post Note  Patient: [name]  Procedure(s) Performed: Procedure(s) (LRB): CARPAL TUNNEL RELEASE (Right) RELEASE TRIGGER FINGER/A-1 PULLEY (Right)  Anesthesia type: MAC  Patient location: PACU  Post pain: Pain level controlled  Post assessment: Patient's Cardiovascular Status Stable  Last Vitals:  Filed Vitals:   06/16/12 1152  BP: 143/88  Pulse: 61  Temp: 36.7 C  Resp: 16    Post vital signs: Reviewed and stable  Level of consciousness: alert  Complications: No apparent anesthesia complications

## 2012-06-16 NOTE — Op Note (Signed)
See op note Dictation [phone] Malayah Demuro MD

## 2012-06-16 NOTE — H&P (Signed)
[name] is an 56 y.o. female.   Chief Complaint: right wrist and middle finger HPI: Plan right CTR and Right MF A1 pulley release.  Marland Kitchen.Patient presents for evaluation and treatment of the of their upper extremity predicament. The patient denies neck back chest or of abdominal pain. The patient notes that they have no lower extremity problems. The patient from primarily complains of the upper extremity pain noted.  Past Medical History  Diagnosis Date  . PONV (postoperative nausea and vomiting)   . Hypertension   . Hypothyroidism   . Depression   . GERD (gastroesophageal reflux disease)   . Arthritis     Past Surgical History  Procedure Date  . Cholecystectomy   . Tubal ligation   . Vaginal hysterectomy   . Tonsillectomy   . Back surgery     Family History  Problem Relation Age of Onset  . Arthritis    . Lung disease    . Cancer    . Asthma     Social History:  reports that she has never smoked. She does not have any smokeless tobacco history on file. She reports that she does not drink alcohol or use illicit drugs.  Allergies:  Allergies  Allergen Reactions  . Codeine   . Penicillins   . Robitussin (Alcohol Free) (Guaifenesin)     Medications Prior to Admission  Medication Sig Dispense Refill  . levothyroxine (SYNTHROID, LEVOTHROID) 125 MCG tablet Take 125 mcg by mouth daily.      Marland Kitchen lisinopril (PRINIVIL,ZESTRIL) 5 MG tablet Take 5 mg by mouth daily.      Marland Kitchen omeprazole (PRILOSEC) 20 MG capsule Take 20 mg by mouth daily.      . sertraline (ZOLOFT) 100 MG tablet Take 100 mg by mouth daily.        Results for orders placed during the hospital encounter of 06/16/12 (from the past 48 hour(s))  BASIC METABOLIC PANEL     Status: Abnormal   Collection Time   06/15/12  2:10 PM      Component Value Range Comment   Sodium 138  135 - 145 mEq/L    Potassium 4.0  3.5 - 5.1 mEq/L    Chloride 102  96 - 112 mEq/L    CO2 24  19 - 32 mEq/L    Glucose, Bld 107 (*) 70 - 99  mg/dL    BUN 15  6 - 23 mg/dL    Creatinine, Ser 2.95  0.50 - 1.10 mg/dL    Calcium 9.4  8.4 - 62.1 mg/dL    GFR calc non Af Amer 73 (*) >90 mL/min    GFR calc Af Amer 84 (*) >90 mL/min    No results found.  Review of Systems  Constitutional: Negative.   Eyes: Negative.   Respiratory: Negative.   Cardiovascular: Negative.   Gastrointestinal: Negative.   Genitourinary: Negative.     Blood pressure 143/88, pulse 61, temperature 98.1 F (36.7 C), temperature source Oral, resp. rate 16, height 5\' 1"  (1.549 m), weight 77.111 kg (170 lb), SpO2 96.00%. Physical Exam ..The patient is alert and oriented in no acute distress the patient complains of pain in the affected upper extremity. The patient is noted to have a normal HEENT exam. Lung fields show equal chest expansion and no shortness of breath abdomen exam is nontender without distention. Lower extremity examination does not show any fracture dislocation or blood clot symptoms. Pelvis is stable neck and back are stable and nontender  Assessment/Plan .Marland KitchenWe are planning surgery for your upper extremity. The risk and benefits of surgery include risk of bleeding infection anesthesia damage to normal structures and failure of the surgery to accomplish its intended goals of relieving symptoms and restoring function with this in mind we'll going to proceed. I have specifically discussed with the patient the pre-and postoperative regime and the does and don'ts and risk and benefits in great detail. Risk and benefits of surgery also include risk of dystrophy chronic nerve pain failure of the healing process to go onto completion and other inherent risks of surgery The relavent the pathophysiology of the disease/injury process, as well as the alternatives for treatment and postoperative course of action has been discussed in great detail with the patient who desires to proceed.  We will do everything in our power to help you (the patient) restore  function to the upper extremity. Is a pleasure to see this patient today.   Narissa Beaufort III,Katelyn Broadnax M 06/16/2012, 1:30 PM

## 2012-06-16 NOTE — Transfer of Care (Signed)
Immediate Anesthesia Transfer of Care Note  Patient: [name]  Procedure(s) Performed: Procedure(s) (LRB): CARPAL TUNNEL RELEASE (Right) RELEASE TRIGGER FINGER/A-1 PULLEY (Right)  Patient Location: PACU  Anesthesia Type: MAC  Level of Consciousness: awake, alert  and oriented  Airway & Oxygen Therapy: Patient Spontanous Breathing  Post-op Assessment: Report given to PACU RN, Post -op Vital signs reviewed and stable and Patient moving all extremities  Post vital signs: Reviewed and stable  Complications: No apparent anesthesia complications

## 2012-06-16 NOTE — Anesthesia Preprocedure Evaluation (Signed)
Anesthesia Evaluation  Patient identified by MRN, date of birth, ID band Patient awake    Reviewed: Allergy & Precautions, H&P , NPO status , Patient's Chart, lab work & pertinent test results  History of Anesthesia Complications (+) PONV  Airway Mallampati: II TM Distance: >3 FB Neck ROM: Full    Dental No notable dental hx. (+) Teeth Intact and Dental Advisory Given   Pulmonary neg pulmonary ROS,  breath sounds clear to auscultation  Pulmonary exam normal       Cardiovascular hypertension, On Medications Rhythm:Regular Rate:Normal     Neuro/Psych PSYCHIATRIC DISORDERS negative neurological ROS     GI/Hepatic Neg liver ROS, GERD-  Medicated and Controlled,  Endo/Other  negative endocrine ROS  Renal/GU negative Renal ROS  negative genitourinary   Musculoskeletal   Abdominal   Peds  Hematology negative hematology ROS (+)   Anesthesia Other Findings   Reproductive/Obstetrics negative OB ROS                           Anesthesia Physical Anesthesia Plan  ASA: II  Anesthesia Plan: MAC   Post-op Pain Management:    Induction: Intravenous  Airway Management Planned: Simple Face Mask  Additional Equipment:   Intra-op Plan:   Post-operative Plan:   Informed Consent: I have reviewed the patients History and Physical, chart, labs and discussed the procedure including the risks, benefits and alternatives for the proposed anesthesia with the patient or authorized representative who has indicated his/her understanding and acceptance.   Dental advisory given  Plan Discussed with: CRNA  Anesthesia Plan Comments:         Anesthesia Quick Evaluation

## 2012-06-17 ENCOUNTER — Encounter (HOSPITAL_BASED_OUTPATIENT_CLINIC_OR_DEPARTMENT_OTHER): Payer: Self-pay | Admitting: Orthopedic Surgery

## 2012-06-17 LAB — POCT HEMOGLOBIN-HEMACUE: Hemoglobin: 14.6 g/dL (ref 12.0–15.0)

## 2012-06-17 NOTE — Op Note (Signed)
NAMEChrisandra [name] NO.:  [id number]  MEDICAL RECORD NO.:  [id number]  LOCATION:                                 FACILITY:  PHYSICIAN:  Dionne Ano. Amanda Pea, M.D.     DATE OF BIRTH:  DATE OF PROCEDURE: DATE OF DISCHARGE:                              OPERATIVE REPORT   PREOPERATIVE DIAGNOSES: 1. Right carpal tunnel syndrome. 2. Right middle finger A1 pulley stenosing tenosynovitis.  POSTOPERATIVE DIAGNOSES: 1. Right carpal tunnel syndrome. 2. Right middle finger A1 pulley stenosing tenosynovitis.  PROCEDURE: 1. Right median nerve/peripheral nerve release at the wrist (carpal     tunnel release). 2. __________ open carpal tunnel release. 3. Right middle finger A1 pulley release with tenosynovectomy of the     FDP and FDS tendons.  SURGEON:  Dionne Ano. Amanda Pea, M.D.  ASSISTANT:  None.  COMPLICATION:  None.  ANESTHESIA:  Peripheral nerve block with IV sedation, keeping the patient awake, alert, and oriented at all times.  INDICATIONS FOR THE PROCEDURE:  A pleasant 56 year old female presents with the above-mentioned diagnosis.  I have counseled her in regards to risks and benefits of the surgery.  With this in mind, she desires to proceed.  All questions have been encouraged and answered preoperatively.  OPERATIVE PROCEDURE:  The patient was seen by myself and Anesthesia, taken to operative suite, underwent smooth induction of peripheral nerve/median nerve block and field block about the middle finger of right hand.  Time-out was called.  Pre and post-op check was complete, following this arm was prepped and draped second time with Betadine scrub and paint.  Tourniquet was insufflated, a 1 cm incision was made at the distal edge of the transverse carpal ligament coursing proximally, fascia was incised.  Following this, distal edge of the transverse carpal ligament was identified and released under 4.0 loupe magnification.  Once the distal edge of the  transverse carpal ligament was released, the fat pad egressed nicely and distal to proximal dissection was carried out until adequate room was available for __________ device 1, 2, 3, which were placed just under the proximal leading leaflet of the transverse carpal ligament.  Following this, a security clip was placed, after disengage __________ and security clip effectively releasing the proximal leaflet of the transverse carpal ligament.  Following this, I then inspected the canal, I obtained hemostasis with bipolar electrocautery, irrigated it, and later closed the wound with tourniquet deflated after the middle finger procedure was over.  She had a thickened transverse carpal ligament.  She was awake, alert, and oriented during all passes of the instruments and tolerated the procedure quite well.  Following this, attention was turned towards the middle finger.  The middle finger underwent an incision, modified Bruner in nature. Dissection was carried down.  The palmar fascia bands were incised, A1 pulley released and tenosynovectomy of the FDP followed by FDS tendons about the middle finger was accomplished.  She tolerated this well.  I manipulated the finger nicely and she demonstrated full passive and active range of motion.  Following this, we irrigated all sites and closed wounds with Prolene, and she demonstrated excellent refill, good range of motion.  No  complicating features.  We will see her back in the office in 7 days.  I asked her to notify should any problems occur. These notes have been discussed.  All questions have been encouraged and answered.  Discharge medicines were Vicodin for pain.     Dionne Ano. Amanda Pea, M.D.     [facility]  D:  06/16/2012  T:  06/17/2012  Job:  [id number]

## 2012-06-28 ENCOUNTER — Ambulatory Visit (INDEPENDENT_AMBULATORY_CARE_PROVIDER_SITE_OTHER): Payer: PRIVATE HEALTH INSURANCE | Admitting: Internal Medicine

## 2012-07-25 ENCOUNTER — Encounter (INDEPENDENT_AMBULATORY_CARE_PROVIDER_SITE_OTHER): Payer: Self-pay | Admitting: Internal Medicine

## 2012-07-25 ENCOUNTER — Ambulatory Visit (INDEPENDENT_AMBULATORY_CARE_PROVIDER_SITE_OTHER): Payer: 59 | Admitting: Internal Medicine

## 2012-07-25 VITALS — BP 110/80 | HR 76 | Temp 97.4°F | Resp 20 | Ht 62.0 in | Wt 172.6 lb

## 2012-07-25 DIAGNOSIS — K638219 Small intestinal bacterial overgrowth, unspecified: Secondary | ICD-10-CM | POA: Insufficient documentation

## 2012-07-25 DIAGNOSIS — K5900 Constipation, unspecified: Secondary | ICD-10-CM | POA: Insufficient documentation

## 2012-07-25 DIAGNOSIS — I10 Essential (primary) hypertension: Secondary | ICD-10-CM | POA: Insufficient documentation

## 2012-07-25 DIAGNOSIS — E039 Hypothyroidism, unspecified: Secondary | ICD-10-CM | POA: Insufficient documentation

## 2012-07-25 DIAGNOSIS — K6389 Other specified diseases of intestine: Secondary | ICD-10-CM | POA: Insufficient documentation

## 2012-07-25 MED ORDER — POLYETHYLENE GLYCOL 3350 17 GM/SCOOP PO POWD: 8.5000 g | Freq: Once | ORAL | Status: AC

## 2012-07-25 MED ORDER — CIPROFLOXACIN HCL 500 MG PO TABS: 500.0000 mg | ORAL_TABLET | Freq: Two times a day (BID) | ORAL | Status: DC

## 2012-07-25 NOTE — Patient Instructions (Addendum)
Continue ciprofloxacin for another 2 weeks after you finish current prescription. Keep symptom diary. Take polyethylene glycol or MiraLax  one fourth to half scoop daily.

## 2012-07-25 NOTE — Progress Notes (Signed)
Presenting complaint;  Intractablebloating. History of small bowel bacterial overgrowth. History of present illness;  Patient is 56 year old Caucasian female who is here for scheduled visit. She has history of small bowel bacterial overgrowth and was last seen by me in [place] office in March 2010. She is requiring antibiotic use every couple of months and wanted to come and review her illness and sees anything different could be done. She has been on Cipro for sinusitis and  feels quite well. At the peak of her symptoms she is extremely bloated and feels as if she is pregnant. She also becomes constipated. She develops nausea but no vomiting. She has tried laxatives but she ends up with worsening flatulence. She denies vomiting melena or rectal bleeding. Her appetite she needs good although she is afraid to eat. Her weight has increased by 3-1/2 pounds in the last 3-1/2 years. Patient states that she is not bloated her bowels move every other day when she is she may have one or 2 bowel movements a week. Her heartburn is well controlled with medications  Current Medications: Current Outpatient Prescriptions  Medication Sig Dispense Refill  . levothyroxine (SYNTHROID, LEVOTHROID) 125 MCG tablet Take 125 mcg by mouth daily.      Marland Kitchen lisinopril (PRINIVIL,ZESTRIL) 5 MG tablet Take 5 mg by mouth daily.      Marland Kitchen omeprazole (PRILOSEC) 20 MG capsule Take 20 mg by mouth daily.      . Probiotic Product (PROBIOTIC DAILY PO) Take by mouth daily.      . sertraline (ZOLOFT) 100 MG tablet Take 100 mg by mouth daily.       Past medical history; Bilateral tubal ligation 1977. Cholecystectomy in 1977. Hysterectomy in 1992. Tonsillectomy 1997. Lumbar spine surgery fordisc disease in November 2008 Hypertension diagnosed 15 years ago. Hypothyroidism diagnosed 15 years. Small bowel bacterial overgrowth was diagnosed in November 2009 based on hydrogen breath test. History of depression. Chronic GERD. EGD in March  2007 revealing hyperplastic gastric polyp. Colonoscopy in March 2007 revealing internal hemorrhoids. 6 weeks ago she had surgery at right hand for carpal tunnel and release of middle trigger finger. Allergies; Allergies  Allergen Reactions  . Codeine   . Penicillins   . Robitussin (Alcohol Free) (Guaifenesin)    family history; Mother died of COPD at age 14. Father died at age 61 of COPD and he also had pulmonary tuberculosis. Sister died of COPD at age 74. Another sister was treated for laryngeal carcinoma and remains in remission. She is 56 years old. Social history; She is married and has 2 grown up children in good health. She works at [employer]. She smoked one to 2 packs per day for 30 years but quit 12 years ago. She does not drink alcohol.     Objective: Blood pressure 110/80, pulse 76, temperature 97.4 F (36.3 C), temperature source Oral, resp. rate 20, height 5\' 2"  (1.575 m), weight 172 lb 9.6 oz (78.291 kg). Patient is alert and in no acute distress. Conjunctiva is pink. Sclera is nonicteric Oropharyngeal mucosa is normal. No neck masses or thyromegaly noted. Cardiac exam with regular rhythm normal S1 and S2. No murmur or gallop noted. Lungs are clear to auscultation. Abdomen is symmetrical. Bowel sounds are normal. On palpation soft abdomen without tenderness organomegaly or masses. No LE edema or clubbing noted.   Assessment:  Patient has recurrent symptoms secondary to small bowel bacterial overgrowth diagnosed in November 2009 with hydrogen breath test. I suspect she may have GI pseudoobstruction since  she becomes constipated when her bloating is most pronounced. She is already feeling better while on Cipro for sinusitis. She has gained 10 pounds since last seen 3-1/2 years ago. She therefore needs to decrease calorie intake which would also help decrease the severity of her bloating.   Plan:  Continue Cipro 500 mg by mouth twice a day for another 2 weeks  after you finish current therapy for sinusitis. Polyethylene glycol 1/4-1/2 scoop daily. Align or equivalent 1 capsule by mouth daily. Office visit in 2 months.

## 2012-09-19 ENCOUNTER — Encounter (INDEPENDENT_AMBULATORY_CARE_PROVIDER_SITE_OTHER): Payer: Self-pay | Admitting: Internal Medicine

## 2012-09-19 ENCOUNTER — Ambulatory Visit (INDEPENDENT_AMBULATORY_CARE_PROVIDER_SITE_OTHER): Payer: 59 | Admitting: Internal Medicine

## 2012-09-19 VITALS — BP 120/80 | HR 76 | Temp 97.5°F | Resp 18 | Ht 61.0 in | Wt 169.1 lb

## 2012-09-19 DIAGNOSIS — K6389 Other specified diseases of intestine: Secondary | ICD-10-CM

## 2012-09-19 DIAGNOSIS — K219 Gastro-esophageal reflux disease without esophagitis: Secondary | ICD-10-CM | POA: Insufficient documentation

## 2012-09-19 MED ORDER — CIPROFLOXACIN HCL 500 MG PO TABS: 500.0000 mg | ORAL_TABLET | Freq: Two times a day (BID) | ORAL | Status: DC

## 2012-09-19 NOTE — Patient Instructions (Signed)
Please try to walk or exercise at least 4 times a week; if you walk try to walk 2 miles at a time. Use Cipro as directed.

## 2012-09-19 NOTE — Progress Notes (Signed)
Presenting complaint;  Followup for intestinal bacterial overgrowth.  Subjective:  Patient is 56 year old Caucasian female who has history of intestinal bacterial overgrowth who was last seen on 07/25/2012 for intractable bloating. She was treated with 2 weeks of Cipro which she finished or 6 weeks ago. She feels 100% better. She notices mild bloating when she eats spicy foods. Her bowels are moving every day. She has lost 3-1/2 pounds since her last visit. She has not experienced any side effects with Cipro. Her heartburn is well controlled with therapy.  Current Medications: Current Outpatient Prescriptions  Medication Sig Dispense Refill  . levothyroxine (SYNTHROID, LEVOTHROID) 125 MCG tablet Take 125 mcg by mouth daily.      Marland Kitchen lisinopril (PRINIVIL,ZESTRIL) 5 MG tablet Take 5 mg by mouth daily.      Marland Kitchen omeprazole (PRILOSEC) 20 MG capsule Take 20 mg by mouth daily.      . Probiotic Product (PROBIOTIC DAILY PO) Take by mouth daily.      . sertraline (ZOLOFT) 100 MG tablet Take 100 mg by mouth daily.      . ciprofloxacin (CIPRO) 500 MG tablet Take 1 tablet (500 mg total) by mouth 2 (two) times daily.  30 tablet  1     Objective: Blood pressure 120/80, pulse 76, temperature 97.5 F (36.4 C), temperature source Oral, resp. rate 18, height 5\' 1"  (1.549 m), weight 169 lb 1.6 oz (76.703 kg). Patient is alert and in no acute distress. Conjunctiva is pink. Sclera is nonicteric Oropharyngeal mucosa is normal. No neck masses or thyromegaly noted. Abdomen is full but symmetrical. Bowel sounds are normal. Abdomen is soft and nontender and percussion note is not tympanitic.  No LE edema or clubbing noted.   Assessment:  #1. Intestinal bacterial overgrowth. She had positive hydrogen breath test back in November 2009. No history of prior GI surgery. Therefore etiology would appear to be idiopathic or may be related to PPI use her subclinical small bowel stasis. She has responded quite nicely to  antibiotic each time. #2. GERD. She is doing well with therapy.   Plan:  New prescription given for Cipro to be used on as-needed basis. Patient advised to increase physical activity and try to lose 10 pounds. Unless she has problems she will return for office visit in one year

## 2012-10-19 ENCOUNTER — Other Ambulatory Visit: Payer: Self-pay | Admitting: Family Medicine

## 2012-10-19 DIAGNOSIS — Z139 Encounter for screening, unspecified: Secondary | ICD-10-CM

## 2012-10-24 ENCOUNTER — Ambulatory Visit (HOSPITAL_COMMUNITY)
Admission: RE | Admit: 2012-10-24 | Discharge: 2012-10-24 | Disposition: A | Discharge status: Home / Self Care | Payer: 59 | Source: Ambulatory Visit | Attending: Family Medicine | Admitting: Family Medicine

## 2012-10-24 DIAGNOSIS — Z1231 Encounter for screening mammogram for malignant neoplasm of breast: Secondary | ICD-10-CM | POA: Insufficient documentation

## 2012-10-24 DIAGNOSIS — Z139 Encounter for screening, unspecified: Secondary | ICD-10-CM

## 2012-12-09 ENCOUNTER — Other Ambulatory Visit: Payer: Self-pay | Admitting: Family Medicine

## 2012-12-09 DIAGNOSIS — M545 Low back pain, unspecified: Secondary | ICD-10-CM

## 2012-12-13 ENCOUNTER — Ambulatory Visit (HOSPITAL_COMMUNITY): Payer: 59

## 2012-12-29 ENCOUNTER — Ambulatory Visit (INDEPENDENT_AMBULATORY_CARE_PROVIDER_SITE_OTHER): Payer: 59 | Admitting: Otolaryngology

## 2012-12-29 DIAGNOSIS — H9120 Sudden idiopathic hearing loss, unspecified ear: Secondary | ICD-10-CM

## 2012-12-29 DIAGNOSIS — H905 Unspecified sensorineural hearing loss: Secondary | ICD-10-CM

## 2013-01-05 ENCOUNTER — Ambulatory Visit (INDEPENDENT_AMBULATORY_CARE_PROVIDER_SITE_OTHER): Payer: 59 | Admitting: Otolaryngology

## 2013-02-04 ENCOUNTER — Encounter: Payer: Self-pay | Admitting: *Deleted

## 2013-02-18 ENCOUNTER — Encounter: Payer: Self-pay | Admitting: *Deleted

## 2013-02-23 ENCOUNTER — Encounter: Payer: Self-pay | Admitting: Nurse Practitioner

## 2013-02-23 ENCOUNTER — Ambulatory Visit (INDEPENDENT_AMBULATORY_CARE_PROVIDER_SITE_OTHER): Payer: 59 | Admitting: Nurse Practitioner

## 2013-02-23 VITALS — BP 129/79 | HR 80 | Temp 97.9°F | Wt 175.2 lb

## 2013-02-23 DIAGNOSIS — F341 Dysthymic disorder: Secondary | ICD-10-CM

## 2013-02-23 DIAGNOSIS — F418 Other specified anxiety disorders: Secondary | ICD-10-CM

## 2013-02-23 DIAGNOSIS — R32 Unspecified urinary incontinence: Secondary | ICD-10-CM

## 2013-02-23 LAB — POCT URINALYSIS DIPSTICK: Spec Grav, UA: 1.025

## 2013-02-23 LAB — POCT UA - MICROSCOPIC ONLY
Bacteria, U Microscopic: NEGATIVE
RBC, urine, microscopic: NEGATIVE
WBC, Ur, HPF, POC: NEGATIVE

## 2013-02-23 MED ORDER — SERTRALINE HCL 100 MG PO TABS: 100.0000 mg | ORAL_TABLET | Freq: Every day | ORAL | Status: DC

## 2013-02-23 MED ORDER — PHENTERMINE HCL 37.5 MG PO CAPS: 37.5000 mg | ORAL_CAPSULE | ORAL | Status: DC

## 2013-02-24 NOTE — Progress Notes (Signed)
Subjective: Presents for recheck. Anxiety and depression much improved back on Zoloft. Sleeping about 7 hours a night. Denies any adverse affects. Would like to restart her phentermine for weight loss. Has taken this fine in the past with Zoloft. Denies any adverse effects. Has been trying to increase her activity. Doing somewhat better with her diet. Has a chronic history of urinary stress and urge incontinence over 10 years. No relief with Detrol. Tried OTC Ditropan patches which worked well for the first month and it did not work anymore. Has noticed more incontinence with her weight gain. Objective: NAD. Alert, oriented. Cheerful affect. Lungs clear. Heart regular rate rhythm. Urine microscopic negative. Assessment/plan:Urinary incontinence in female  Bladder leak - Plan: POCT UA - Microscopic Only, POCT urinalysis dipstick  Depression with anxiety - Plan: sertraline (ZOLOFT) 100 MG tablet  Morbid obesity - Plan: phentermine 37.5 MG capsule  no intervention for urinary incontinence at this point, patient will work on weight gain to see if this will help improve her symptoms. Recheck in 3 months, call back sooner if any problems.

## 2013-03-15 ENCOUNTER — Telehealth: Payer: Self-pay | Admitting: Family Medicine

## 2013-03-15 NOTE — Telephone Encounter (Signed)
ok 

## 2013-03-15 NOTE — Telephone Encounter (Signed)
Patient says that she is leaving for [place] on the 18th of this month and she will run out of her Phentermine 37.5 mg by then and [facility]. Says that they can only fill it early with approval from the Doctor. Can we call them and approve for early fill? She has it shipped to [facility].

## 2013-03-15 NOTE — Telephone Encounter (Signed)
Med called into [facility] per doctors orders.

## 2013-03-22 ENCOUNTER — Ambulatory Visit: Payer: 59 | Admitting: Family Medicine

## 2013-04-24 ENCOUNTER — Other Ambulatory Visit: Payer: Self-pay | Admitting: Family Medicine

## 2013-05-15 ENCOUNTER — Encounter: Payer: Self-pay | Admitting: Family Medicine

## 2013-05-15 ENCOUNTER — Ambulatory Visit (INDEPENDENT_AMBULATORY_CARE_PROVIDER_SITE_OTHER): Payer: 59 | Admitting: Family Medicine

## 2013-05-15 VITALS — BP 122/80 | Temp 98.4°F | Ht 62.0 in | Wt 166.4 lb

## 2013-05-15 DIAGNOSIS — F329 Major depressive disorder, single episode, unspecified: Secondary | ICD-10-CM

## 2013-05-15 DIAGNOSIS — F32A Depression, unspecified: Secondary | ICD-10-CM | POA: Insufficient documentation

## 2013-05-15 DIAGNOSIS — I10 Essential (primary) hypertension: Secondary | ICD-10-CM

## 2013-05-15 DIAGNOSIS — R5383 Other fatigue: Secondary | ICD-10-CM | POA: Insufficient documentation

## 2013-05-15 DIAGNOSIS — J310 Chronic rhinitis: Secondary | ICD-10-CM

## 2013-05-15 DIAGNOSIS — R5381 Other malaise: Secondary | ICD-10-CM

## 2013-05-15 DIAGNOSIS — J329 Chronic sinusitis, unspecified: Secondary | ICD-10-CM

## 2013-05-15 DIAGNOSIS — F3289 Other specified depressive episodes: Secondary | ICD-10-CM

## 2013-05-15 DIAGNOSIS — E039 Hypothyroidism, unspecified: Secondary | ICD-10-CM

## 2013-05-15 MED ORDER — CEFPROZIL 500 MG PO TABS: 500.0000 mg | ORAL_TABLET | Freq: Two times a day (BID) | ORAL | Status: DC

## 2013-05-15 NOTE — Patient Instructions (Addendum)
Increase zoloft to one tab per day for 7 days. Then increase to one and a half tab per day

## 2013-05-15 NOTE — Progress Notes (Signed)
  Subjective:    Patient ID: [name], female    DOB: Jun 05, 1956, 57 y.o.   MRN: [id number]  HPI Fatigue and tired ness, decreased energy. Sig night time sweats. States depression has worsened. Actually decreased her Zoloft to just 50 mg daily. Still stretched, doing the work of two jobs for one person.  Compliant with walking. Pos fam hx of depression. Working twenty per wk. unhappy with current job. Overall things going well at home.  Claims compliance with her thyroid medicine. Blood work last obtained last fall.  Blood work did confirm true menopause. Patient having night sweats. Feels this is worsening. Review of Systems No chest pain no shortness of breath no abdominal pain ongoing weight gain ROS otherwise negative    Objective:   Physical Exam  Alert mild malaise. Lungs clear. Heart regular rate and rhythm. H&T normal. Neck supple. Neuro intact. Thyroid nonpalpable.      Assessment & Plan:  Impression #1 hypothyroidism control uncertain. #2 hypertension good control. #3 fatigue discuss. #4 depression worsening. Plan encouraged to work up to 150 mg Zoloft. Exercise strongly encourage. Diet discussed. Appropriate blood work to assess thyroid. 25 minutes spent most in discussion. WSL

## 2013-05-16 ENCOUNTER — Encounter: Payer: Self-pay | Admitting: Family Medicine

## 2013-05-16 LAB — CBC WITH DIFFERENTIAL/PLATELET
Basophils Absolute: 0.1 10*3/uL (ref 0.0–0.1)
Basophils Relative: 1 % (ref 0–1)
HCT: 41.8 % (ref 36.0–46.0)
Hemoglobin: 14.3 g/dL (ref 12.0–15.0)
Lymphocytes Relative: 37 % (ref 12–46)
Monocytes Absolute: 0.6 10*3/uL (ref 0.1–1.0)
Neutro Abs: 4.3 10*3/uL (ref 1.7–7.7)
Neutrophils Relative %: 53 % (ref 43–77)
RDW: 14.3 % (ref 11.5–15.5)
WBC: 8.1 10*3/uL (ref 4.0–10.5)

## 2013-05-16 LAB — TSH: TSH: 0.656 u[IU]/mL (ref 0.350–4.500)

## 2013-05-26 ENCOUNTER — Ambulatory Visit: Payer: 59 | Admitting: Family Medicine

## 2013-05-29 ENCOUNTER — Other Ambulatory Visit: Payer: Self-pay | Admitting: Nurse Practitioner

## 2013-06-02 ENCOUNTER — Telehealth: Payer: Self-pay | Admitting: Family Medicine

## 2013-06-02 MED ORDER — CIPROFLOXACIN HCL 500 MG PO TABS: 500.0000 mg | ORAL_TABLET | Freq: Two times a day (BID) | ORAL | Status: AC

## 2013-06-02 NOTE — Telephone Encounter (Signed)
Notified patient's husband medication was sent into pharmacy.

## 2013-06-02 NOTE — Telephone Encounter (Signed)
Pt says that she has bad bacteria in her intestines and hasn't used the bathroom in 5 days. She says that Dr. Karilyn Cota usually handles this and gives her an Rx for cipro, but he is out of town and his assistant isn't in today and she was instructed to call her family doctor.   [facility]

## 2013-06-02 NOTE — Telephone Encounter (Signed)
cipro 500 bid ten days

## 2013-06-05 ENCOUNTER — Other Ambulatory Visit: Payer: Self-pay | Admitting: Family Medicine

## 2013-07-18 ENCOUNTER — Ambulatory Visit: Payer: 59 | Admitting: Family Medicine

## 2013-08-21 ENCOUNTER — Other Ambulatory Visit: Payer: Self-pay | Admitting: Nurse Practitioner

## 2013-08-21 ENCOUNTER — Other Ambulatory Visit: Payer: Self-pay | Admitting: Family Medicine

## 2013-08-23 ENCOUNTER — Ambulatory Visit (INDEPENDENT_AMBULATORY_CARE_PROVIDER_SITE_OTHER): Payer: 59 | Admitting: Nurse Practitioner

## 2013-08-23 ENCOUNTER — Encounter: Payer: Self-pay | Admitting: Nurse Practitioner

## 2013-08-23 VITALS — BP 132/90 | Ht 62.0 in | Wt 171.0 lb

## 2013-08-23 DIAGNOSIS — F411 Generalized anxiety disorder: Secondary | ICD-10-CM

## 2013-08-23 DIAGNOSIS — F419 Anxiety disorder, unspecified: Secondary | ICD-10-CM | POA: Insufficient documentation

## 2013-08-23 MED ORDER — CLORAZEPATE DIPOTASSIUM 7.5 MG PO TABS: 7.5000 mg | ORAL_TABLET | Freq: Two times a day (BID) | ORAL | Status: DC | PRN

## 2013-08-23 NOTE — Progress Notes (Signed)
Subjective:  Presents complaints of a flareup of her anxiety symptoms. 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 Patient complaints of short temper and crying. Emotional lability. Difficulty focusing and short-term memory loss. Denies suicidal or homicidal thoughts or ideation. Currently on Zoloft 150 mg. Has started walking program twice a day. Do not take Klonopin even a low dose, causes prolonged drowsiness up to 12 hours.  Objective:   BP 132/90  Ht 5\' 2"  (1.575 m)  Wt 171 lb (77.565 kg)  BMI 31.27 kg/m2 NAD. Alert, oriented. Mildly anxious affect. Crying at times during office visit. Lungs clear. Heart regular rate rhythm.  Assessment: Anxiety  exacerbated by situational stress  Plan: Continue Zoloft as directed. Meds ordered this encounter  Medications  . sertraline (ZOLOFT) 100 MG tablet    Sig:   . clorazepate (TRANXENE) 7.5 MG tablet    Sig: Take 1 tablet (7.5 mg total) by mouth 2 (two) times daily as needed for anxiety.    Dispense:  30 tablet    Refill:  2    Order Specific Question:  Supervising Provider    Answer:  Merlyn Albert [[id number]]   Patient agrees to mental health counseling, given information. Recheck in 3 months, call back sooner if no improvement.

## 2013-08-23 NOTE — Assessment & Plan Note (Signed)
.   sertraline (ZOLOFT) 100 MG tablet    Sig:   . clorazepate (TRANXENE) 7.5 MG tablet    Sig: Take 1 tablet (7.5 mg total) by mouth 2 (two) times daily as needed for anxiety.    Dispense:  30 tablet    Refill:  2    Order Specific Question:  Supervising Provider    Answer:  Merlyn Albert [[id number]]   Patient agrees to mental health counseling, given information. Recheck in 3 months, call back sooner if no improvement.

## 2013-09-08 ENCOUNTER — Encounter (INDEPENDENT_AMBULATORY_CARE_PROVIDER_SITE_OTHER): Payer: Self-pay | Admitting: *Deleted

## 2013-09-25 ENCOUNTER — Encounter (INDEPENDENT_AMBULATORY_CARE_PROVIDER_SITE_OTHER): Payer: Self-pay | Admitting: Internal Medicine

## 2013-09-25 ENCOUNTER — Ambulatory Visit (INDEPENDENT_AMBULATORY_CARE_PROVIDER_SITE_OTHER): Payer: 59 | Admitting: Internal Medicine

## 2013-09-25 VITALS — BP 142/90 | HR 72 | Temp 98.1°F | Ht 62.0 in | Wt 174.5 lb

## 2013-09-25 DIAGNOSIS — K5900 Constipation, unspecified: Secondary | ICD-10-CM

## 2013-09-25 NOTE — Patient Instructions (Addendum)
Linzess daily. PR in 2 weeks.  OV in 1 yr

## 2013-09-25 NOTE — Progress Notes (Signed)
Subjective:     Patient ID: [name], female   DOB: [birth year], 57 y.o.   MRN: [id number]  HPI  Here today for follow of history of intestinal bacterial overgrowth. . She was last seen in October of 2013. She has a hx of intractable bloating. She has been treated with Cipro in the past but she says this is not helping anymore.. Today she says she feels okay. She says her abdomen is always tight feeling.  She usually goes to the [facility] about once a week. She is not taking anything for her constipation. She has tried Miralax, Chartered certified accountant  and Mag Citrate in the past and these  ot help. She has gained 5 pounds since her last visit.  Appetite is good. He heart burn in well controlled. No melena or bright red rectal bleeding.    2009 Breath test: IMPRESSION: Alveolar hydrogen concentration of 25 parts per million at  30 minutes is indicative of small bowel bacterial overgrowth.      Review of Systems Current Outpatient Prescriptions  Medication Sig Dispense Refill  . levothyroxine (SYNTHROID, LEVOTHROID) 125 MCG tablet TAKE 1 TABLET BY MOUTH DAILY  90 tablet  1  . lisinopril (PRINIVIL,ZESTRIL) 5 MG tablet TAKE 1 TABLET BY MOUTH DAILY  90 tablet  1  . omeprazole (PRILOSEC) 20 MG capsule TAKE 1 CAPSULE BY MOUTH DAILY  90 capsule  1  . Probiotic Product (PROBIOTIC DAILY PO) Take by mouth daily.      . sertraline (ZOLOFT) 100 MG tablet        No current facility-administered medications for this visit.   Past Medical History  Diagnosis Date  . PONV (postoperative nausea and vomiting)   . Hypertension   . Depression   . GERD (gastroesophageal reflux disease)   . Arthritis   . Anxiety   . Hypothyroidism   . Hypothyroid    Past Surgical History  Procedure Laterality Date  . Cholecystectomy    . Tubal ligation    . Vaginal hysterectomy    . Tonsillectomy    . Back surgery    . Carpal tunnel release  06/16/2012    Procedure: CARPAL TUNNEL RELEASE;  Surgeon: Dominica Severin, MD;   Location: [facility];  Service: Orthopedics;  Laterality: Right;  limited open carpal tunnel release  . Trigger finger release  06/16/2012    Procedure: RELEASE TRIGGER FINGER/A-1 PULLEY;  Surgeon: Dominica Severin, MD;  Location: [facility];  Service: Orthopedics;  Laterality: Right;  right middle a-1 release  . Colonoscopy    . Upper gastrointestinal endoscopy     Allergies  Allergen Reactions  . Codeine   . Penicillins   . Robitussin (Alcohol Free) [Guaifenesin]        Objective:   Physical Exam  Filed Vitals:   09/25/13 1455  BP: 142/90  Pulse: 72  Temp: 98.1 F (36.7 C)  Height: 5\' 2"  (1.575 m)  Weight: 174 lb 8 oz (79.153 kg)   Alert and oriented. Skin warm and dry. Oral mucosa is moist.   . Sclera anicteric, conjunctivae is pink. Thyroid not enlarged. No cervical lymphadenopathy. Lungs clear. Heart regular rate and rhythm.  Abdomen is soft. Distended.  Bowel sounds are positive. No hepatomegaly. No abdominal masses felt. No tenderness.  No edema to lower extremities.       Assessment:    Constipation. She has tried numerous OTC AND Rx's without very much luck. She also has a hx of intestinal bacterial  overgrowth.     Plan:    Lnizess once a day x 5 boxes. PR in 2 weeks.  OV in 1 year.

## 2013-10-02 ENCOUNTER — Encounter: Payer: Self-pay | Admitting: Nurse Practitioner

## 2013-10-02 ENCOUNTER — Ambulatory Visit (INDEPENDENT_AMBULATORY_CARE_PROVIDER_SITE_OTHER): Payer: 59 | Admitting: Nurse Practitioner

## 2013-10-02 VITALS — BP 140/90 | Ht 62.0 in | Wt 174.8 lb

## 2013-10-02 DIAGNOSIS — F419 Anxiety disorder, unspecified: Secondary | ICD-10-CM

## 2013-10-02 DIAGNOSIS — E039 Hypothyroidism, unspecified: Secondary | ICD-10-CM

## 2013-10-02 DIAGNOSIS — F411 Generalized anxiety disorder: Secondary | ICD-10-CM

## 2013-10-02 DIAGNOSIS — L659 Nonscarring hair loss, unspecified: Secondary | ICD-10-CM

## 2013-10-02 MED ORDER — LORCASERIN HCL 10 MG PO TABS: 10.0000 mg | ORAL_TABLET | Freq: Every day | ORAL | Status: DC

## 2013-10-02 NOTE — Patient Instructions (Signed)
Biotin Sulfate free shampoo 

## 2013-10-03 ENCOUNTER — Encounter: Payer: Self-pay | Admitting: Nurse Practitioner

## 2013-10-03 LAB — TSH: TSH: 5.785 u[IU]/mL — ABNORMAL HIGH (ref 0.350–4.500)

## 2013-10-03 NOTE — Assessment & Plan Note (Signed)
TSH pending. Will evaluate thyroid to see if this is a role in her hair loss.

## 2013-10-03 NOTE — Assessment & Plan Note (Signed)
Continue Zoloft as directed. Recheck in 3-4 months.

## 2013-10-03 NOTE — Assessment & Plan Note (Signed)
Encouraged continued walking program. Also discussed healthy diet. Given prescriptions for Belviq to see if her insurance will help cover this. Cautioned about potential adverse effects. DC med if any problems. Otherwise routine followup in 3-4 months.

## 2013-10-03 NOTE — Progress Notes (Signed)
Subjective:  Presents for recheck. Took her Tranxene for 2 days and stopped because she did not need it anymore. Traveled up [place] and into [place] with her husband for several weeks. During that time patient was able to calm down, anxiety much improved. Was given Linzess by GI specialist but stopped this due to bloating. Is able to control her IBS symptoms with OTC products. Currently on Zoloft 100 mg 1-1/2 tabs daily. Her only concerns today are her weight and significant hair loss including around her eyebrows. Currently on levothyroxine for hypothyroidism.  Objective:   BP 140/90  Ht 5\' 2"  (1.575 m)  Wt 174 lb 12.8 oz (79.289 kg)  BMI 31.96 kg/m2 NAD. Alert, oriented. Both eyebrows are very thin. Thyroid normal limit to palpation and nontender. Lungs clear. Heart regular rate rhythm.  Assessment:Hypothyroidism - Plan: TSH  Anxiety  Hair loss  morbid obesity  Plan: Further followup based on TSH results. Encouraged continued walking program. Also discussed healthy diet. Given prescriptions for Belviq to see if her insurance will help cover this. Cautioned about potential adverse effects. DC med if any problems. Otherwise routine followup in 3-4 months.

## 2013-10-05 ENCOUNTER — Other Ambulatory Visit: Payer: Self-pay | Admitting: Nurse Practitioner

## 2013-10-05 ENCOUNTER — Other Ambulatory Visit: Payer: Self-pay | Admitting: *Deleted

## 2013-10-05 DIAGNOSIS — R7989 Other specified abnormal findings of blood chemistry: Secondary | ICD-10-CM

## 2013-10-05 DIAGNOSIS — E039 Hypothyroidism, unspecified: Secondary | ICD-10-CM

## 2013-10-05 MED ORDER — LEVOTHYROXINE SODIUM 137 MCG PO TABS: 137.0000 ug | ORAL_TABLET | Freq: Every day | ORAL | Status: DC

## 2013-10-17 ENCOUNTER — Ambulatory Visit (HOSPITAL_COMMUNITY)
Admission: RE | Admit: 2013-10-17 | Discharge: 2013-10-17 | Disposition: A | Discharge status: Home / Self Care | Payer: 59 | Source: Ambulatory Visit | Attending: Family Medicine | Admitting: Family Medicine

## 2013-10-17 ENCOUNTER — Ambulatory Visit (INDEPENDENT_AMBULATORY_CARE_PROVIDER_SITE_OTHER): Payer: 59 | Admitting: Family Medicine

## 2013-10-17 ENCOUNTER — Encounter: Payer: Self-pay | Admitting: Family Medicine

## 2013-10-17 VITALS — BP 126/80 | Temp 97.9°F | Ht 62.0 in | Wt 177.0 lb

## 2013-10-17 DIAGNOSIS — M545 Low back pain, unspecified: Secondary | ICD-10-CM | POA: Insufficient documentation

## 2013-10-17 DIAGNOSIS — M79609 Pain in unspecified limb: Secondary | ICD-10-CM | POA: Insufficient documentation

## 2013-10-17 DIAGNOSIS — G8929 Other chronic pain: Secondary | ICD-10-CM

## 2013-10-17 DIAGNOSIS — M51379 Other intervertebral disc degeneration, lumbosacral region without mention of lumbar back pain or lower extremity pain: Secondary | ICD-10-CM | POA: Insufficient documentation

## 2013-10-17 DIAGNOSIS — M5137 Other intervertebral disc degeneration, lumbosacral region: Secondary | ICD-10-CM | POA: Insufficient documentation

## 2013-10-17 MED ORDER — HYDROCODONE-ACETAMINOPHEN 5-325 MG PO TABS: 1.0000 | ORAL_TABLET | Freq: Every evening | ORAL | Status: DC | PRN

## 2013-10-17 MED ORDER — ETODOLAC 400 MG PO TABS: 400.0000 mg | ORAL_TABLET | Freq: Two times a day (BID) | ORAL | Status: DC

## 2013-10-17 MED ORDER — CHLORZOXAZONE 500 MG PO TABS: 500.0000 mg | ORAL_TABLET | Freq: Three times a day (TID) | ORAL | Status: DC | PRN

## 2013-10-17 NOTE — Progress Notes (Signed)
  Subjective:    Patient ID: [name], female    DOB: Jun 04, 1956, 57 y.o.   MRN: [id number]  Back Pain This is a chronic problem. The current episode started more than 1 year ago. The problem occurs constantly. The problem has been gradually worsening since onset. The pain is present in the lumbar spine. The quality of the pain is described as aching. The pain radiates to the left thigh and right thigh. The pain is at a severity of 7/10. The pain is moderate. The pain is the same all the time. Stiffness is present all day. She has tried NSAIDs and heat for the symptoms. The treatment provided moderate relief.   Hx of low back pain started Patient also complains of neck pain. It has been present for about 2 weeks now.   Low back surg in 2008. Right lumvbar rad down post buttock Deep achey pain, bothers at night.  Walking feels better, trying to exercise.  Using aleave prn pain  Review of Systems  Musculoskeletal: Positive for back pain.   no chest pain no abdominal pain no change in bowel habits ROS otherwise negative     Objective:   Physical Exam Alert mild malaise neck supple some parascapular and paracervical tenderness. Distal arm strength sensation intact. Lungs clear. Heart rare in rhythm. Low back tender to percussion. Plus minus straight leg raise.       Assessment & Plan:  Impression 1 chronic low back pain worsening. #2 cervical pain acute likely muscles or tendons discussed doubt discs or neuropathic compromise. Plan trial of Lodine twice a day with food. Low back x-rays done. Recheck in one month. 25 minutes spent most in discussion. WSL

## 2013-10-22 DIAGNOSIS — M545 Other chronic pain: Secondary | ICD-10-CM | POA: Insufficient documentation

## 2013-10-22 DIAGNOSIS — G8929 Other chronic pain: Secondary | ICD-10-CM | POA: Insufficient documentation

## 2013-10-26 ENCOUNTER — Telehealth: Payer: Self-pay | Admitting: Family Medicine

## 2013-10-26 NOTE — Telephone Encounter (Signed)
Spoke with patient and let her know that we do not refill restasis and that she would have to go to her eye doctor. Pt verbalized understanding.

## 2013-10-26 NOTE — Progress Notes (Signed)
[facility] 11/20 

## 2013-10-26 NOTE — Telephone Encounter (Signed)
i do not rx restasis!!

## 2013-10-26 NOTE — Telephone Encounter (Signed)
Patient says that her eyes are "dry as a bone". She is out of the restasis that her eye doctor prescribed her and she wants to know if we can call another Rx for restasis in or does she have to go back to eye doctor for a refill  [facility] outpatient

## 2013-11-08 ENCOUNTER — Telehealth: Payer: Self-pay | Admitting: *Deleted

## 2013-11-08 NOTE — Telephone Encounter (Signed)
Patient having fatigue, sleeping for about 12 hours a day. Taking 137 mcg since October was on 125 mcg. She is going to have TSH done today. Order was put in system.

## 2013-11-08 NOTE — Telephone Encounter (Signed)
Nurse to clarify with pt why she thinks thyr dose is wrong, go ahead and do tsh and we'll see

## 2013-11-08 NOTE — Telephone Encounter (Signed)
Pt is taking Tyroid Medication she is saying it is to high of a dosage. Do she need to come in? Do she need her blood drawn again?

## 2013-11-14 ENCOUNTER — Ambulatory Visit (INDEPENDENT_AMBULATORY_CARE_PROVIDER_SITE_OTHER): Payer: 59 | Admitting: Family Medicine

## 2013-11-14 ENCOUNTER — Encounter: Payer: Self-pay | Admitting: Family Medicine

## 2013-11-14 VITALS — BP 130/80 | Ht 62.0 in | Wt 177.5 lb

## 2013-11-14 DIAGNOSIS — I10 Essential (primary) hypertension: Secondary | ICD-10-CM

## 2013-11-14 DIAGNOSIS — E039 Hypothyroidism, unspecified: Secondary | ICD-10-CM

## 2013-11-14 DIAGNOSIS — G8929 Other chronic pain: Secondary | ICD-10-CM

## 2013-11-14 DIAGNOSIS — F411 Generalized anxiety disorder: Secondary | ICD-10-CM

## 2013-11-14 DIAGNOSIS — M545 Low back pain, unspecified: Secondary | ICD-10-CM

## 2013-11-14 DIAGNOSIS — F419 Anxiety disorder, unspecified: Secondary | ICD-10-CM

## 2013-11-14 MED ORDER — PHENTERMINE HCL 37.5 MG PO CAPS: 37.5000 mg | ORAL_CAPSULE | ORAL | Status: DC

## 2013-11-14 NOTE — Progress Notes (Signed)
   Subjective:    Patient ID: [name], female    DOB: 10/04/1956, 57 y.o.   MRN: [id number]  HPI Patient is here today for follow up visit on back pain. Ongoing primarily in the lumbar region deep ache little is been going on for years comes and goes. Patient had hoped it would be better when she retired still given her challenges.  Notes significant fatigue and tiredness. Usually takes and early afternoon. Not exercising whatsoever. Claims no excessive depression or anxiety. States that she had TSH level drawn last week and she would like to discuss the results also. TSH results within good range. Patient compliant with thyroid medication.  Neck is no better, still has bad pain and discomfort. Neck pain is primarily in the right posterior neck region worse when turning her neck one another. Has been going on for about a month. No nocturnal pain recalls no sudden injury.  Extremely frustrated by her weight. States the pelvic did not help at all. No notes the phentermine did help in the past. Would like to try again.     Results for orders placed in visit on 10/05/13  TSH      Result Value Range   TSH 3.158  0.350 - 4.500 uIU/mL    Review of Systems No chest pain no abdominal pain no shortness of breath no change in bowel habits no blood in stool no loss of consciousness ROS otherwise negative    Objective:   Physical Exam  Alert mild malaise. HEENT normal. Lungs clear. Heart regular in rhythm. Abdomen benign. Ankles edema. Right posterior lateral neck tender to palpation with rotation and neck distal arm strength sensation etc. all intact. Low back minimal pain and direct percussion      Assessment & Plan:  Impression #1 paracervical ligament pain neck discussed #2 chronic low back pain discuss. #3 hypertension good control #4 hypothyroidism good control. #5 obesity high BMI discussed plan reinitiate phentermine check in 3 months diet exercise strongly encouraged maintain same  other medications. WSL

## 2013-11-16 ENCOUNTER — Other Ambulatory Visit: Payer: Self-pay

## 2013-11-16 MED ORDER — OMEPRAZOLE 20 MG PO CPDR: DELAYED_RELEASE_CAPSULE | ORAL | Status: DC

## 2013-11-27 ENCOUNTER — Ambulatory Visit (INDEPENDENT_AMBULATORY_CARE_PROVIDER_SITE_OTHER): Payer: 59 | Admitting: Family Medicine

## 2013-11-27 ENCOUNTER — Encounter: Payer: Self-pay | Admitting: Family Medicine

## 2013-11-27 VITALS — BP 152/88 | Temp 98.4°F | Ht 62.0 in | Wt 178.0 lb

## 2013-11-27 DIAGNOSIS — J329 Chronic sinusitis, unspecified: Secondary | ICD-10-CM

## 2013-11-27 DIAGNOSIS — J310 Chronic rhinitis: Secondary | ICD-10-CM

## 2013-11-27 MED ORDER — ALBUTEROL SULFATE HFA 108 (90 BASE) MCG/ACT IN AERS: 2.0000 | INHALATION_SPRAY | Freq: Four times a day (QID) | RESPIRATORY_TRACT | Status: DC | PRN

## 2013-11-27 MED ORDER — LEVOFLOXACIN 500 MG PO TABS: 500.0000 mg | ORAL_TABLET | Freq: Every day | ORAL | Status: AC

## 2013-11-27 NOTE — Progress Notes (Signed)
   Subjective:    Patient ID: [name], female    DOB: Sep 20, 1956, 57 y.o.   MRN: [id number]  Cough This is a new problem. The current episode started in the past 7 days. Associated symptoms include chest pain, ear congestion, headaches, a sore throat and shortness of breath. Associated symptoms comments: Hoarse, sinus pressure. Treatments tried: tussin and advil. The treatment provided no relief.   Hoarseness kicked in sat, started crackling, Ran low gr fever . Achey, diminished energy Review of Systems  HENT: Positive for sore throat.   Respiratory: Positive for cough and shortness of breath.   Cardiovascular: Positive for chest pain.  Neurological: Positive for headaches.       Objective:   Physical Exam  Alert mild malaise frontal maxillary tenderness evident pharynx normal neck supple. Lungs clear heart regular rate and rhythm. Intermittent bronchial cough during exam      Assessment & Plan:  Impression 1 rhinosinusitis plan antibiotics prescribed. Symptomatic care discussed. Levaquin 500 daily 10 days. Use albuterol when necessary for any wheeze. WSL

## 2014-01-08 ENCOUNTER — Other Ambulatory Visit: Payer: Self-pay | Admitting: Family Medicine

## 2014-01-08 ENCOUNTER — Other Ambulatory Visit: Payer: Self-pay | Admitting: Nurse Practitioner

## 2014-02-05 ENCOUNTER — Other Ambulatory Visit: Payer: Self-pay | Admitting: Family Medicine

## 2014-02-14 ENCOUNTER — Ambulatory Visit (INDEPENDENT_AMBULATORY_CARE_PROVIDER_SITE_OTHER): Payer: 59 | Admitting: Family Medicine

## 2014-02-14 ENCOUNTER — Encounter: Payer: Self-pay | Admitting: Family Medicine

## 2014-02-14 VITALS — BP 122/78 | Ht 62.0 in | Wt 178.4 lb

## 2014-02-14 DIAGNOSIS — F32A Depression, unspecified: Secondary | ICD-10-CM

## 2014-02-14 DIAGNOSIS — M545 Low back pain, unspecified: Secondary | ICD-10-CM

## 2014-02-14 DIAGNOSIS — I10 Essential (primary) hypertension: Secondary | ICD-10-CM

## 2014-02-14 DIAGNOSIS — Z8262 Family history of osteoporosis: Secondary | ICD-10-CM

## 2014-02-14 DIAGNOSIS — F329 Major depressive disorder, single episode, unspecified: Secondary | ICD-10-CM

## 2014-02-14 DIAGNOSIS — G8929 Other chronic pain: Secondary | ICD-10-CM

## 2014-02-14 DIAGNOSIS — E039 Hypothyroidism, unspecified: Secondary | ICD-10-CM

## 2014-02-14 DIAGNOSIS — F3289 Other specified depressive episodes: Secondary | ICD-10-CM

## 2014-02-14 DIAGNOSIS — K219 Gastro-esophageal reflux disease without esophagitis: Secondary | ICD-10-CM

## 2014-02-14 MED ORDER — PHENTERMINE HCL 37.5 MG PO CAPS: 37.5000 mg | ORAL_CAPSULE | ORAL | Status: DC

## 2014-02-14 MED ORDER — DICLOFENAC SODIUM 75 MG PO TBEC: 75.0000 mg | DELAYED_RELEASE_TABLET | Freq: Two times a day (BID) | ORAL | Status: DC

## 2014-02-14 NOTE — Progress Notes (Signed)
   Subjective:    Patient ID: [name], female    DOB: 10/22/1956, 58 y.o.   MRN: [id number]  HPI  Patient arrives for a follow up on blood pressure and meds patient has cut down her salt considerably. She claims compliance with her blood pressure medicine. Control good when checked elsewhere.  Reports overall her asthma is stable. Just rare wheezing at this time.   He patient frustrated that she has not lost any weight. However on further history she continues to eat most anything she once and has not exercising regularly. Pt watching what she eats  . Exercising not much lately  Patient reports overall her depression is stable. No suicidal thoughts.  Reflexes stables long as she sticks with her medication.   compliantwith all meds  Swelling and tender and pain. Deltoid region left shoulder,  Aching and pain, can lead to numbness if driving, no injury or overdoing it with this shoulder Ex equip bicycle sets too highPatient reports she is having problems with her left shoulder and arm-swollen hurts and goes numb-esp when driving.  Review of Systems No chest pain no headache no back pain no change in bowel habits or blood in stool ROS otherwise negative.    Objective:   Physical Exam Alert HEENT slight nasal congestion vital stable. Lungs clear. Heart rare in rhythm. Ankles without edema. Left shoulder good range of motion. No true impingement sign. Distinct deltoid tenderness.       Assessment & Plan:  Impression 1 deltoid bursitis discussed #2 hypertension controlled good. #3 hypothyroidism stable. #4 reflux. #5 depression clinically stable. #6 weight gain discussed #7 fatigue ongoing plan patient really needs to start exercising and watching her diet discussed at length. Maintain other medications. Diet exercise discussed. Recheck in several months. WSL

## 2014-02-26 ENCOUNTER — Ambulatory Visit (HOSPITAL_COMMUNITY)
Admission: RE | Admit: 2014-02-26 | Discharge: 2014-02-26 | Disposition: A | Discharge status: Home / Self Care | Payer: 59 | Source: Ambulatory Visit | Attending: Family Medicine | Admitting: Family Medicine

## 2014-02-26 DIAGNOSIS — Z780 Asymptomatic menopausal state: Secondary | ICD-10-CM | POA: Insufficient documentation

## 2014-02-26 DIAGNOSIS — Z8262 Family history of osteoporosis: Secondary | ICD-10-CM | POA: Insufficient documentation

## 2014-03-07 ENCOUNTER — Encounter: Payer: Self-pay | Admitting: Family Medicine

## 2014-03-07 ENCOUNTER — Ambulatory Visit (INDEPENDENT_AMBULATORY_CARE_PROVIDER_SITE_OTHER): Payer: 59 | Admitting: Family Medicine

## 2014-03-07 VITALS — BP 130/86 | Ht 62.0 in | Wt 176.2 lb

## 2014-03-07 DIAGNOSIS — M7552 Bursitis of left shoulder: Secondary | ICD-10-CM

## 2014-03-07 DIAGNOSIS — M67919 Unspecified disorder of synovium and tendon, unspecified shoulder: Secondary | ICD-10-CM

## 2014-03-07 DIAGNOSIS — L719 Rosacea, unspecified: Secondary | ICD-10-CM | POA: Insufficient documentation

## 2014-03-07 DIAGNOSIS — M719 Bursopathy, unspecified: Secondary | ICD-10-CM

## 2014-03-07 NOTE — Progress Notes (Signed)
   Subjective:    Patient ID: [name], female    DOB: Dec 05, 1956, 58 y.o.   MRN: [id number]  Shoulder Pain  The pain is present in the left shoulder. This is a new problem. The current episode started 1 to 4 weeks ago. The problem occurs intermittently. The problem has been unchanged. The quality of the pain is described as aching. The pain is moderate. Associated symptoms include numbness. The symptoms are aggravated by activity. She has tried NSAIDS for the symptoms. The treatment provided no relief.  Patient need a refill on metrogel also. Patient has history of rosacea. Notes that flares up as she gets out of the sun. Has started flaring up more recently. The MetroGel definitely helps.  Woke up the other night with a shoulder aching very bad. Deep in the deltoid region. Radiated on out the arm. Felt like it was swollen. Had done some furniture work at home. But he is right-handed. Review of Systems  Neurological: Positive for numbness.   No headache no neck pain no right shoulder pain ROS otherwise negative.    Objective:   Physical Exam  Alert HEENT face slight rosacea changes TMs normal frontal neck supple. Lungs clear heart rare rhythm. Left shoulder deltoid distinctly tender swollen positive difficult abduction.  Patient was prepped draped injected with 1 cc Ventolin 2 cc of Xylocaine. Symptomatic care discussed.      Assessment & Plan:  Impression 1 rosacea. Discussed. #2 left shoulder bursitis. Plan local measures discussed. MetroGel prescribed. Proper use discussed. WSL

## 2014-03-21 ENCOUNTER — Telehealth: Payer: Self-pay | Admitting: Family Medicine

## 2014-03-21 NOTE — Telephone Encounter (Signed)
Seen 03/07/14. Diagnosed with rosacea.

## 2014-03-21 NOTE — Telephone Encounter (Signed)
Please send to Brett CanalesSteve, he can do, he saw her

## 2014-03-21 NOTE — Telephone Encounter (Signed)
Ok rx metrogel apply as directed ref prn

## 2014-03-21 NOTE — Telephone Encounter (Signed)
Pt states that we were supposed to send in Rx for METROGEL and it's never been sent in.  [facility] and [facility], please call pt when done

## 2014-03-22 MED ORDER — METRONIDAZOLE 1 % EX GEL: 1.0000 "application " | Freq: Every day | CUTANEOUS | Status: DC

## 2014-03-22 NOTE — Telephone Encounter (Signed)
Rx sent electronically to pharmacy. Patient notified. 

## 2014-04-09 ENCOUNTER — Other Ambulatory Visit: Payer: Self-pay | Admitting: Family Medicine

## 2014-04-17 ENCOUNTER — Telehealth: Payer: Self-pay | Admitting: Family Medicine

## 2014-04-17 MED ORDER — SERTRALINE HCL 100 MG PO TABS: ORAL_TABLET | ORAL | Status: DC

## 2014-04-17 NOTE — Telephone Encounter (Signed)
Rx sent electronically to pharmacy. Patient notified. 

## 2014-04-17 NOTE — Telephone Encounter (Signed)
[facility]

## 2014-04-19 ENCOUNTER — Telehealth: Payer: Self-pay | Admitting: Family Medicine

## 2014-04-19 ENCOUNTER — Other Ambulatory Visit: Payer: Self-pay | Admitting: Nurse Practitioner

## 2014-04-19 MED ORDER — SERTRALINE HCL 100 MG PO TABS: ORAL_TABLET | ORAL | Status: DC

## 2014-04-19 NOTE — Telephone Encounter (Signed)
Patient says that at one time, Eber JonesCarolyn had told her to increase her zoloft to 150 mg instead of 100 mg. So she did this and that is why she is almost out of her meds. She said [facility] needs us to call them and tell them that it needs to be increased to 150 mg.

## 2014-04-19 NOTE — Telephone Encounter (Signed)
Patient notified

## 2014-04-19 NOTE — Telephone Encounter (Signed)
Will send in updated Rx directly to [facility]

## 2014-05-02 ENCOUNTER — Encounter: Payer: Self-pay | Admitting: Family Medicine

## 2014-05-02 ENCOUNTER — Ambulatory Visit (INDEPENDENT_AMBULATORY_CARE_PROVIDER_SITE_OTHER): Payer: 59 | Admitting: Family Medicine

## 2014-05-02 VITALS — BP 130/80 | Ht 62.0 in | Wt 170.1 lb

## 2014-05-02 DIAGNOSIS — I10 Essential (primary) hypertension: Secondary | ICD-10-CM

## 2014-05-02 DIAGNOSIS — M67919 Unspecified disorder of synovium and tendon, unspecified shoulder: Secondary | ICD-10-CM

## 2014-05-02 DIAGNOSIS — M719 Bursopathy, unspecified: Secondary | ICD-10-CM

## 2014-05-02 DIAGNOSIS — M7552 Bursitis of left shoulder: Secondary | ICD-10-CM

## 2014-05-02 NOTE — Progress Notes (Signed)
   Subjective:    Patient ID: VEONA DORY, female    DOB: December 14, 1955, 58 y.o.   MRN: [id number]  Shoulder Pain  The pain is present in the left shoulder. This is a chronic problem. The current episode started in the past 7 days. The problem occurs intermittently. The problem has been unchanged. The quality of the pain is described as aching. The pain is at a severity of 2/10. The pain is moderate. The symptoms are aggravated by activity. She has tried NSAIDS for the symptoms. The treatment provided mild relief.   Patient states she wants a cortisone shot. No other concerns at this time.   Patient also compliant with blood pressure medicine exercise regularly. His numbness and dose. Trying to watch her salt intake. Blood pressure good when checked elsewhere.  Review of Systems No headache no chest pain no back pain no abdominal pain no injury ROS otherwise negative    Objective:   Physical Exam Alert blood pressure good on repeat HEENT normal. Lungs clear. Heart rare rhythm. Left deltoid distinctly tender to palpation good range of motion of shoulder.       Assessment & Plan:  Impression 1 hypertension good control discussed #2 bursitis shoulder discussed patient would like another injection helped.  Patient was prepped and draped. In anesthetized. Injected 1 cc steroid and 2 cc of Xylocaine. Wound care discussed. Local measures discussed. Range of motion exercises discussed. WSL

## 2014-05-08 ENCOUNTER — Ambulatory Visit: Payer: 59 | Admitting: Family Medicine

## 2014-05-14 ENCOUNTER — Encounter: Payer: Self-pay | Admitting: Nurse Practitioner

## 2014-05-14 ENCOUNTER — Ambulatory Visit (INDEPENDENT_AMBULATORY_CARE_PROVIDER_SITE_OTHER): Payer: 59 | Admitting: Nurse Practitioner

## 2014-05-14 DIAGNOSIS — N951 Menopausal and female climacteric states: Secondary | ICD-10-CM

## 2014-05-14 DIAGNOSIS — Z780 Asymptomatic menopausal state: Secondary | ICD-10-CM

## 2014-05-14 MED ORDER — PHENTERMINE HCL 37.5 MG PO CAPS: 37.5000 mg | ORAL_CAPSULE | ORAL | Status: DC

## 2014-05-16 ENCOUNTER — Encounter: Payer: Self-pay | Admitting: Nurse Practitioner

## 2014-05-16 ENCOUNTER — Other Ambulatory Visit: Payer: Self-pay | Admitting: Nurse Practitioner

## 2014-05-16 MED ORDER — GABAPENTIN 100 MG PO CAPS: ORAL_CAPSULE | ORAL | Status: DC

## 2014-05-16 NOTE — Progress Notes (Signed)
See previous note. Research shows neurontin can be just as effective as HRT in helping hot flashes. Will start with low dose and slowly titrate up.

## 2014-05-16 NOTE — Progress Notes (Signed)
Left message to return call 

## 2014-05-16 NOTE — Progress Notes (Signed)
Patient notified

## 2014-05-16 NOTE — Progress Notes (Signed)
Subjective:  Presents requesting a refill on her phentermine. Has an active job. Has started a walking program. Overall healthy diet. Having significant hot flashes particularly at nighttime. Has tried natural supplements with limited improvement.  Objective:   BP 148/94  Ht 5\' 2"  (1.575 m)  Wt 168 lb (76.204 kg)  BMI 30.72 kg/m2 NAD. Alert, oriented. Lungs clear. Heart regular rate rhythm.  Assessment:Morbid obesity  Menopause  Plan: Meds ordered this encounter  Medications  . phentermine 37.5 MG capsule    Sig: Take 1 capsule (37.5 mg total) by mouth every morning.    Dispense:  30 capsule    Refill:  2    Order Specific Question:  Supervising Provider    Answer:  Riccardo Dubin   Will look into off label use of Neurontin for menopausal hot flashes. Recommend continued weight loss efforts. Recheck here as needed.

## 2014-05-22 ENCOUNTER — Telehealth: Payer: Self-pay | Admitting: Family Medicine

## 2014-05-22 ENCOUNTER — Other Ambulatory Visit: Payer: Self-pay | Admitting: *Deleted

## 2014-05-22 DIAGNOSIS — Z1231 Encounter for screening mammogram for malignant neoplasm of breast: Secondary | ICD-10-CM

## 2014-05-22 NOTE — Telephone Encounter (Signed)
Discussed with patient

## 2014-05-22 NOTE — Telephone Encounter (Signed)
Patient has to take her sister Isabella StallingDonna Higgins to do her mammogram on 05/29/14 at 10:30 at [facility]. She wants to know if we can schedule her a mammogram around the same time so that she can get this done while she is there?

## 2014-05-22 NOTE — Telephone Encounter (Signed)
[facility] June 23 10:50 am register 10:30

## 2014-05-29 ENCOUNTER — Ambulatory Visit (HOSPITAL_COMMUNITY)
Admission: RE | Admit: 2014-05-29 | Discharge: 2014-05-29 | Disposition: A | Discharge status: Home / Self Care | Payer: 59 | Source: Ambulatory Visit | Attending: Family Medicine | Admitting: Family Medicine

## 2014-05-29 DIAGNOSIS — Z1231 Encounter for screening mammogram for malignant neoplasm of breast: Secondary | ICD-10-CM | POA: Insufficient documentation

## 2014-06-12 ENCOUNTER — Encounter (INDEPENDENT_AMBULATORY_CARE_PROVIDER_SITE_OTHER): Payer: Self-pay | Admitting: *Deleted

## 2014-06-18 ENCOUNTER — Encounter: Payer: Self-pay | Admitting: Family Medicine

## 2014-06-18 ENCOUNTER — Ambulatory Visit (INDEPENDENT_AMBULATORY_CARE_PROVIDER_SITE_OTHER): Payer: 59 | Admitting: Family Medicine

## 2014-06-18 VITALS — BP 146/90 | Temp 98.2°F | Ht 62.0 in | Wt 168.0 lb

## 2014-06-18 DIAGNOSIS — J019 Acute sinusitis, unspecified: Secondary | ICD-10-CM

## 2014-06-18 DIAGNOSIS — L0201 Cutaneous abscess of face: Secondary | ICD-10-CM

## 2014-06-18 DIAGNOSIS — L03211 Cellulitis of face: Secondary | ICD-10-CM

## 2014-06-18 MED ORDER — DOXYCYCLINE HYCLATE 100 MG PO CAPS: 100.0000 mg | ORAL_CAPSULE | Freq: Two times a day (BID) | ORAL | Status: DC

## 2014-06-18 NOTE — Progress Notes (Signed)
   Subjective:    Patient ID: Audrey DoughtyCathy H Riegler, female    DOB: 18-May-1956, 58 y.o.   MRN: [id number]  HPI Patient is here today for a knot that is on the right side of her nasal cavity. It is sore to the touch.  She has nasal congestion, scratchy throat, HA, and feels stuffy. Patient relates significant soreness pain discomfort. Denies previous trouble or injury. Has never had this before. She is fatigue.  S/S started about 3 days ago.       Review of Systems No wheezing no shortness of breath or nausea vomiting diarrhea energy level overall doing pretty fair to poor the past day or 2    Objective:   Physical Exam Eardrums are normal throat is normal neck is supple she does have what appears to be some mild cellulitis near the nasal opening on the right side I see no evidence of an infection in the gum line or the teeth but certainly that could be a possible source more likely it's from the nostril       Assessment & Plan:  Going to beach on Sunday we will go ahead with doxycycline for the next several days if doing well Friday when she calls we will switch to a antibiotic that is more friendly with being in the sun, probably clindamycin 300 mg 3 times a day for additional 6 days  Doxycycline will help cover for any possibility of staph

## 2014-06-22 ENCOUNTER — Telehealth: Payer: Self-pay | Admitting: *Deleted

## 2014-06-22 MED ORDER — CLINDAMYCIN HCL 300 MG PO CAPS: 300.0000 mg | ORAL_CAPSULE | Freq: Three times a day (TID) | ORAL | Status: AC

## 2014-06-22 NOTE — Telephone Encounter (Signed)
I would recommend clindamycin 300 mg 1 3 times a day for 5 days. I would also recommend probiotic one daily for the next few weeks. She does not need to restart the doxycycline. If any problems let us know.

## 2014-06-22 NOTE — Telephone Encounter (Signed)
Patient notified and verbalized understanding. 

## 2014-06-22 NOTE — Telephone Encounter (Signed)
Pt seen Monday for sinus infection and possible staph. Prescribed doxy but she is going to beach on Sunday and was told if she is doing better she can switch to a med she can take in the sun. Pt states she is feeling better. The spot in her nose is better. Still sore to the touch. Note states you will change her to clindamycin 300mg  one TID  For 6 days. Do you want to change me to this and if so pt wants to know when she returns from beach should she go back on doxy and finish it.

## 2014-07-09 ENCOUNTER — Encounter: Payer: Self-pay | Admitting: Family Medicine

## 2014-07-09 ENCOUNTER — Ambulatory Visit (INDEPENDENT_AMBULATORY_CARE_PROVIDER_SITE_OTHER): Payer: 59 | Admitting: Family Medicine

## 2014-07-09 VITALS — BP 169/94 | Temp 98.4°F | Ht 62.0 in | Wt 169.0 lb

## 2014-07-09 DIAGNOSIS — R21 Rash and other nonspecific skin eruption: Secondary | ICD-10-CM

## 2014-07-09 MED ORDER — PREDNISONE 20 MG PO TABS: ORAL_TABLET | ORAL | Status: DC

## 2014-07-09 NOTE — Progress Notes (Signed)
   Subjective:    Patient ID: [name], female    DOB: 07-27-1956, 58 y.o.   MRN: [id number]  Rash This is a new problem. The current episode started in the past 7 days. The affected locations include the left elbow, right elbow and left foot. The rash is characterized by itchiness and redness. She was exposed to nothing. Past treatments include anti-itch cream and antihistamine. The treatment provided no relief.    fri night woke up feeling bad, very itchy  Cluster on both elbows, very uncomfortable  No obvious fever or no chills. No headache  Review of Systems  Skin: Positive for rash.   No abdominal pain no vomiting    Objective:   Physical Exam  Alert vitals stable. HEENT normal. Lungs clear. Heart regular in rhythm. Elbows to strain erythematous papules with excoriation bilateral also right forearm and right lateral foot.      Assessment & Plan:  Impression multiple insect bites likely diagnosis. Many many questions answered. 15 minutes spent most in discussion. Plan prednisone taper due to severity of symptoms. Since Medicare discussed. WSL

## 2014-07-16 ENCOUNTER — Other Ambulatory Visit: Payer: Self-pay | Admitting: Family Medicine

## 2014-07-30 ENCOUNTER — Ambulatory Visit (INDEPENDENT_AMBULATORY_CARE_PROVIDER_SITE_OTHER): Payer: 59 | Admitting: Family Medicine

## 2014-07-30 ENCOUNTER — Encounter: Payer: Self-pay | Admitting: Family Medicine

## 2014-07-30 VITALS — BP 130/82 | Ht 62.0 in | Wt 172.8 lb

## 2014-07-30 DIAGNOSIS — M545 Low back pain, unspecified: Secondary | ICD-10-CM

## 2014-07-30 MED ORDER — ETODOLAC 400 MG PO TABS: 400.0000 mg | ORAL_TABLET | Freq: Two times a day (BID) | ORAL | Status: DC

## 2014-07-30 MED ORDER — HYDROCODONE-ACETAMINOPHEN 5-325 MG PO TABS: 1.0000 | ORAL_TABLET | Freq: Four times a day (QID) | ORAL | Status: DC | PRN

## 2014-07-30 MED ORDER — CHLORZOXAZONE 500 MG PO TABS: 500.0000 mg | ORAL_TABLET | Freq: Three times a day (TID) | ORAL | Status: DC | PRN

## 2014-07-30 NOTE — Progress Notes (Signed)
   Subjective:    Patient ID: SHAR PAEZ, female    DOB: [birth year], 58 y.o.   MRN: [id number]  Back Pain This is a new problem. The current episode started in the past 7 days. The pain is present in the lumbar spine. The pain radiates to the left thigh. The symptoms are aggravated by bending, sitting, lying down and twisting. She has tried analgesics for the symptoms.    codeine causes head to crawl Verline Lema ched suddenly for a falling plant,m  And had sudden pain  Now radiating down into the left leg  Trying lidocaine patches  Too four ibuprofen Review of Systems  Musculoskeletal: Positive for back pain.       Objective:   Physical Exam Alert mild malaise. Lungs clear. Heart rare in rhythm. Positive paraspinal lumbar tenderness to deep palpation. No spinal tenderness. No true straight leg raise       Assessment & Plan:  Impression lumbar strain/spasm plan anti-inflammatory medicine/muscle spasm medicine/pain medicine. Symptomatic care discussed. Local measures discussed. WSL

## 2014-07-30 NOTE — Patient Instructions (Signed)

## 2014-08-29 ENCOUNTER — Telehealth: Payer: Self-pay | Admitting: *Deleted

## 2014-08-29 ENCOUNTER — Other Ambulatory Visit: Payer: Self-pay | Admitting: *Deleted

## 2014-08-29 MED ORDER — OMEPRAZOLE 20 MG PO CPDR: DELAYED_RELEASE_CAPSULE | ORAL | Status: DC

## 2014-08-29 MED ORDER — LEVOTHYROXINE SODIUM 137 MCG PO TABS: ORAL_TABLET | ORAL | Status: DC

## 2014-08-29 NOTE — Telephone Encounter (Signed)
[facility] to clarify pt's meds. Request from [facility]. For lisinopril , sertraline . The mg don't match what's in chart. Also requesting triamterene-hctz 37.5 - 25 that's not on med list. The other meds requested were sent to pharm (lisinopril and lefothyroxine) see form in nurse's basket.

## 2014-08-30 ENCOUNTER — Other Ambulatory Visit: Payer: Self-pay | Admitting: *Deleted

## 2014-08-30 MED ORDER — OMEPRAZOLE 20 MG PO CPDR: DELAYED_RELEASE_CAPSULE | ORAL | Status: DC

## 2014-08-30 NOTE — Telephone Encounter (Signed)
Discussed with pt. The meds on our list are correct and pt does not want refills from them.

## 2014-09-21 ENCOUNTER — Encounter: Payer: Self-pay | Admitting: Nurse Practitioner

## 2014-09-21 ENCOUNTER — Ambulatory Visit (INDEPENDENT_AMBULATORY_CARE_PROVIDER_SITE_OTHER): Payer: 59 | Admitting: Nurse Practitioner

## 2014-09-21 VITALS — BP 130/88 | Ht 62.0 in | Wt 171.0 lb

## 2014-09-21 DIAGNOSIS — E039 Hypothyroidism, unspecified: Secondary | ICD-10-CM

## 2014-09-21 DIAGNOSIS — Z780 Asymptomatic menopausal state: Secondary | ICD-10-CM

## 2014-09-21 DIAGNOSIS — Z79899 Other long term (current) drug therapy: Secondary | ICD-10-CM

## 2014-09-21 DIAGNOSIS — R5383 Other fatigue: Secondary | ICD-10-CM

## 2014-09-21 DIAGNOSIS — Z23 Encounter for immunization: Secondary | ICD-10-CM

## 2014-09-21 MED ORDER — SERTRALINE HCL 100 MG PO TABS: ORAL_TABLET | ORAL | Status: DC

## 2014-09-21 MED ORDER — PHENTERMINE-TOPIRAMATE ER 3.75-23 MG PO CP24: ORAL_CAPSULE | ORAL | Status: DC

## 2014-09-21 MED ORDER — PHENTERMINE-TOPIRAMATE ER 7.5-46 MG PO CP24: ORAL_CAPSULE | ORAL | Status: DC

## 2014-09-22 ENCOUNTER — Other Ambulatory Visit: Payer: Self-pay | Admitting: Nurse Practitioner

## 2014-09-22 LAB — CBC WITH DIFFERENTIAL/PLATELET
BASOS PCT: 1 % (ref 0–1)
Basophils Absolute: 0.1 10*3/uL (ref 0.0–0.1)
EOS ABS: 0.4 10*3/uL (ref 0.0–0.7)
Eosinophils Relative: 7 % — ABNORMAL HIGH (ref 0–5)
HCT: 42 % (ref 36.0–46.0)
Hemoglobin: 14.6 g/dL (ref 12.0–15.0)
LYMPHS ABS: 1.9 10*3/uL (ref 0.7–4.0)
Lymphocytes Relative: 32 % (ref 12–46)
MCH: 33 pg (ref 26.0–34.0)
MCHC: 34.8 g/dL (ref 30.0–36.0)
MCV: 94.8 fL (ref 78.0–100.0)
MONOS PCT: 8 % (ref 3–12)
Monocytes Absolute: 0.5 10*3/uL (ref 0.1–1.0)
Neutro Abs: 3 10*3/uL (ref 1.7–7.7)
Neutrophils Relative %: 52 % (ref 43–77)
Platelets: 238 10*3/uL (ref 150–400)
RBC: 4.43 MIL/uL (ref 3.87–5.11)
RDW: 13.6 % (ref 11.5–15.5)
WBC: 5.8 10*3/uL (ref 4.0–10.5)

## 2014-09-22 LAB — TSH: TSH: 0.924 u[IU]/mL (ref 0.350–4.500)

## 2014-09-23 LAB — HEPATIC FUNCTION PANEL
ALT: 28 U/L (ref 0–35)
AST: 21 U/L (ref 0–37)
Albumin: 4.1 g/dL (ref 3.5–5.2)
Alkaline Phosphatase: 57 U/L (ref 39–117)
BILIRUBIN DIRECT: 0.1 mg/dL (ref 0.0–0.3)
BILIRUBIN INDIRECT: 0.2 mg/dL (ref 0.2–1.2)
BILIRUBIN TOTAL: 0.3 mg/dL (ref 0.2–1.2)
Total Protein: 7 g/dL (ref 6.0–8.3)

## 2014-09-23 LAB — LIPID PANEL
Cholesterol: 194 mg/dL (ref 0–200)
HDL: 49 mg/dL (ref >39)
LDL Cholesterol: 131 mg/dL — ABNORMAL HIGH (ref 0–99)
Total CHOL/HDL Ratio: 4 Ratio
Triglycerides: 72 mg/dL (ref <150)
VLDL: 14 mg/dL (ref 0–40)

## 2014-09-23 LAB — CBC WITH DIFFERENTIAL/PLATELET

## 2014-09-23 LAB — BASIC METABOLIC PANEL
BUN: 12 mg/dL (ref 6–23)
CALCIUM: 9.3 mg/dL (ref 8.4–10.5)
CO2: 25 meq/L (ref 19–32)
Chloride: 103 mEq/L (ref 96–112)
Creat: 0.86 mg/dL (ref 0.50–1.10)
GLUCOSE: 93 mg/dL (ref 70–99)
POTASSIUM: 4.5 meq/L (ref 3.5–5.3)
Sodium: 139 mEq/L (ref 135–145)

## 2014-09-23 LAB — TSH: TSH: 0.893 u[IU]/mL (ref 0.350–4.500)

## 2014-09-24 ENCOUNTER — Encounter: Payer: Self-pay | Admitting: Nurse Practitioner

## 2014-09-24 LAB — LIPID PANEL
Cholesterol: 199 mg/dL (ref 0–200)
HDL: 52 mg/dL (ref >39)
LDL Cholesterol: 133 mg/dL — ABNORMAL HIGH (ref 0–99)
Total CHOL/HDL Ratio: 3.8 Ratio
Triglycerides: 72 mg/dL (ref <150)
VLDL: 14 mg/dL (ref 0–40)

## 2014-09-24 LAB — HEPATIC FUNCTION PANEL
ALT: 27 U/L (ref 0–35)
AST: 21 U/L (ref 0–37)
Albumin: 4.1 g/dL (ref 3.5–5.2)
Alkaline Phosphatase: 54 U/L (ref 39–117)
Bilirubin, Direct: 0.1 mg/dL (ref 0.0–0.3)
Indirect Bilirubin: 0.2 mg/dL (ref 0.2–1.2)
Total Bilirubin: 0.3 mg/dL (ref 0.2–1.2)
Total Protein: 7 g/dL (ref 6.0–8.3)

## 2014-09-24 LAB — BASIC METABOLIC PANEL
BUN: 13 mg/dL (ref 6–23)
CHLORIDE: 104 meq/L (ref 96–112)
CO2: 29 meq/L (ref 19–32)
CREATININE: 0.95 mg/dL (ref 0.50–1.10)
Calcium: 9.3 mg/dL (ref 8.4–10.5)
GLUCOSE: 94 mg/dL (ref 70–99)
POTASSIUM: 4.4 meq/L (ref 3.5–5.3)
Sodium: 140 mEq/L (ref 135–145)

## 2014-09-24 LAB — VITAMIN D 25 HYDROXY (VIT D DEFICIENCY, FRACTURES): VIT D 25 HYDROXY: 48 ng/mL (ref 30–89)

## 2014-09-24 NOTE — Progress Notes (Signed)
Subjective:  Presents for routine follow up. Had hysterectomy; still has one ovary. Has experienced some fatigue and decreased sex drive; wonders if it is hormones. Also has completed her course of phentermine, would like to try a different medication. Has been a long-term Zoloft, seems to be working well. Doing some walking. Continues to have significant stress. Doing fairly well with her diet.  Objective:   BP 130/88  Ht 5\' 2"  (1.575 m)  Wt 171 lb (77.565 kg)  BMI 31.27 kg/m2 NAD. Alert, oriented. Lungs clear. Heart regular rate rhythm. Thyroid normal limit to palpation, no masses or goiters noted, nontender.  Assessment:  Problem List Items Addressed This Visit     Endocrine   Hypothyroidism - Primary   Relevant Orders      TSH (Completed)     Other   Fatigue   Relevant Orders      CBC with Differential (Completed)      Vit D  25 hydroxy (rtn osteoporosis monitoring) (Completed)   Morbid obesity   Relevant Medications      Phentermine-Topiramate (QSYMIA) 3.75-23 MG CP24      Phentermine-Topiramate (QSYMIA) 7.5-46 MG CP24   Other Relevant Orders      Lipid panel (Completed)    Other Visit Diagnoses   Menopause        High risk medication use        Relevant Orders       Hepatic function panel (Completed)       Basic metabolic panel (Completed)      Plan: Meds ordered this encounter  Medications  . sertraline (ZOLOFT) 100 MG tablet    Sig: TAKE 1 1/2 TABLETS  BY MOUTH ONCE DAILY    Dispense:  135 tablet    Refill:  1    PLEASE NOTE DOSE CHANGE. THANKS.    Order Specific Question:  Supervising Provider    Answer:  Merlyn AlbertLUKING, WILLIAM S [[id number]]  . Phentermine-Topiramate (QSYMIA) 3.75-23 MG CP24    Sig: One po q am    Dispense:  14 capsule    Refill:  0    Order Specific Question:  Supervising Provider    Answer:  Merlyn AlbertLUKING, WILLIAM S [[id number]]  . Phentermine-Topiramate (QSYMIA) 7.5-46 MG CP24    Sig: One po q am    Dispense:  30 capsule    Refill:  0    Order Specific  Question:  Supervising Provider    Answer:  Merlyn AlbertLUKING, WILLIAM S [[id number]]   Switch to Qsymia. Cautioned patient about potential cost. Routine labs ordered. Continue Zoloft as directed. Followup in 6 weeks if she continues Qsymia. About potential adverse effects. DC med and call if any problems. Discussed menopausal symptoms. Reviewed potential risk associated with hormone replacement. Patient defers for now.

## 2014-09-25 ENCOUNTER — Ambulatory Visit (INDEPENDENT_AMBULATORY_CARE_PROVIDER_SITE_OTHER): Payer: 59 | Admitting: Internal Medicine

## 2014-09-25 LAB — VITAMIN D 25 HYDROXY (VIT D DEFICIENCY, FRACTURES): Vit D, 25-Hydroxy: <10 ng/mL — ABNORMAL LOW (ref 30–89)

## 2014-10-01 ENCOUNTER — Telehealth: Payer: Self-pay | Admitting: Nurse Practitioner

## 2014-10-01 NOTE — Telephone Encounter (Signed)
Pt is wondering if you were still going to send her for a hormone check at the lab?  [facility]   Pt states she is supposed to be able to get a refill on Valtrex, I don't see it however in  Her med list.  She states she last filled on 10/2013

## 2014-10-02 ENCOUNTER — Other Ambulatory Visit: Payer: Self-pay | Admitting: Family Medicine

## 2014-10-02 ENCOUNTER — Other Ambulatory Visit: Payer: Self-pay | Admitting: Nurse Practitioner

## 2014-10-02 NOTE — Telephone Encounter (Signed)
At her age, she has most likely completed menopause. I do not see need for hormone levels, we can look at ways to do hormone replacement. Also I will check on her Valtrex and send in today.

## 2014-10-02 NOTE — Telephone Encounter (Signed)
Discussed with patient. Patient will have pharmacy send in request for refill of Valtrex.

## 2014-10-02 NOTE — Telephone Encounter (Signed)
Actually a review of the chart indicates that we have not prescribed Valtrex at least not since going on Epic.

## 2014-10-02 NOTE — Telephone Encounter (Signed)
Left message to return call 

## 2014-10-03 ENCOUNTER — Other Ambulatory Visit: Payer: Self-pay | Admitting: *Deleted

## 2014-10-03 MED ORDER — VALACYCLOVIR HCL 1 G PO TABS: ORAL_TABLET | ORAL | Status: DC

## 2014-10-22 ENCOUNTER — Ambulatory Visit: Payer: 59 | Admitting: Family Medicine

## 2014-10-31 ENCOUNTER — Ambulatory Visit (INDEPENDENT_AMBULATORY_CARE_PROVIDER_SITE_OTHER): Payer: 59 | Admitting: Family Medicine

## 2014-10-31 ENCOUNTER — Encounter: Payer: Self-pay | Admitting: Family Medicine

## 2014-10-31 VITALS — BP 130/82 | Ht 62.0 in | Wt 172.2 lb

## 2014-10-31 DIAGNOSIS — M545 Low back pain: Secondary | ICD-10-CM

## 2014-10-31 MED ORDER — PREDNISONE 20 MG PO TABS: ORAL_TABLET | ORAL | Status: DC

## 2014-10-31 NOTE — Progress Notes (Signed)
   Subjective:    Patient ID: Audrey DoughtyCathy H [name], female    DOB: [birth year], 58 y.o.   MRN: [id number]  HPI Patient arrives with complaint of back pain and left shoulder pain since putting up christmas decorations over weekend. Patient states she has had surgery on her back in the past and the pain flares up at times.  Moved a heavy snowman along the way On further history notes posterior shoulder pain. Worse with certain motions.  Notes low back pain worse with movement. Minimal radiation into the legs.   History of low back surgery.  Patient states she feels fatigued and tired all the time. Frustrated by this. Reports way back when when she started Zoloft she felt much better for a while. Also notes when she takes phentermine that she feels considerably better.  Review of Systems No headache no chest pain no abdominal pain no change in bowel habits    Objective:   Physical Exam Alert moderate malaise. Vital stable. Low back tender to percussion. Negative straight leg raise. Negative spinal tenderness. Shoulder good range of motion with some impingement findings.       Assessment & Plan:  Impression 1 flare of low back pain #2 flare of shoulder pain plan prednisone taper given. Physical therapy to work on pain resolution and teaching of exercises. Recheck in several weeks will delve more into fatigue I advised patient may well be a substantial mental component with this. Long discussion held. Easily 25 minutes spent most in discussion WSL

## 2014-11-05 ENCOUNTER — Other Ambulatory Visit: Payer: Self-pay | Admitting: *Deleted

## 2014-11-05 MED ORDER — PHENTERMINE-TOPIRAMATE ER 3.75-23 MG PO CP24: ORAL_CAPSULE | ORAL | Status: DC

## 2014-11-22 ENCOUNTER — Encounter: Payer: Self-pay | Admitting: Family Medicine

## 2014-11-22 ENCOUNTER — Ambulatory Visit (INDEPENDENT_AMBULATORY_CARE_PROVIDER_SITE_OTHER): Payer: 59 | Admitting: Family Medicine

## 2014-11-22 VITALS — BP 142/88 | Ht 62.0 in | Wt 172.0 lb

## 2014-11-22 DIAGNOSIS — I10 Essential (primary) hypertension: Secondary | ICD-10-CM

## 2014-11-22 DIAGNOSIS — E039 Hypothyroidism, unspecified: Secondary | ICD-10-CM

## 2014-11-22 DIAGNOSIS — F329 Major depressive disorder, single episode, unspecified: Secondary | ICD-10-CM

## 2014-11-22 DIAGNOSIS — M545 Low back pain, unspecified: Secondary | ICD-10-CM

## 2014-11-22 DIAGNOSIS — F32A Depression, unspecified: Secondary | ICD-10-CM

## 2014-11-22 DIAGNOSIS — R5383 Other fatigue: Secondary | ICD-10-CM

## 2014-11-22 DIAGNOSIS — G8929 Other chronic pain: Secondary | ICD-10-CM

## 2014-11-22 MED ORDER — HYDROCODONE-ACETAMINOPHEN 5-325 MG PO TABS: 1.0000 | ORAL_TABLET | Freq: Four times a day (QID) | ORAL | Status: DC | PRN

## 2014-11-22 NOTE — Progress Notes (Signed)
   Subjective:    Patient ID: Audrey DoughtyCathy H [name], female    DOB: February 08, 1956, 58 y.o.   MRN: [id number]  HPI follow up on low back pain. Doing better. Finished steroids. Will start PT tomorrow. Next  Patient states she definitely needs hydrocodone around for flares of back pain when it occurs. Next  Frustrated that we will not give her phentermine long-term. She states it helps her weight, but it also helps her mood.  Patient notes fatigue for a long time. Has been on high number of antidepressants that helped none whatsoever. Admits to feeling down. No suicidal or homicidal thoughts. Claims compliance with her current antidepressant.    Review of Systems No headache no chest pain no abdominal pain no change about some blood in stool no rash ROS otherwise negative    Objective:   Physical Exam  Alert no acute distress H&T normal neck supple lungs clear heart rare rhythm low back some tenderness to percussion. Negative straight leg raise ankles without edema.      Assessment & Plan:  Impression #1 hypertension good control. #2 chronic pain ongoing though currently improved. #3 hypothyroidism clinically stable #4 chronic fatigue I think related to #5. Patient imposes this. States that she is not "crazy" #6 depression of note has had multiple failures on previous antidepressants. Plan work on physical therapy. Maintain all meds. Diet exercise discussed. Hydrocodone when necessary. Recheck in several months. If fatigue persists will try yet another antidepressant. Of note very resistant to psych referral. Easily 35-40 minutes spent in visit. WSL

## 2014-11-23 ENCOUNTER — Ambulatory Visit (HOSPITAL_COMMUNITY)
Admission: RE | Admit: 2014-11-23 | Discharge: 2014-11-23 | Disposition: A | Discharge status: Home / Self Care | Payer: 59 | Source: Ambulatory Visit | Attending: Family Medicine | Admitting: Family Medicine

## 2014-11-23 DIAGNOSIS — R29898 Other symptoms and signs involving the musculoskeletal system: Secondary | ICD-10-CM

## 2014-11-23 DIAGNOSIS — M545 Low back pain: Secondary | ICD-10-CM | POA: Insufficient documentation

## 2014-11-23 DIAGNOSIS — Z5189 Encounter for other specified aftercare: Secondary | ICD-10-CM | POA: Diagnosis not present

## 2014-11-23 DIAGNOSIS — M25659 Stiffness of unspecified hip, not elsewhere classified: Secondary | ICD-10-CM

## 2014-11-23 DIAGNOSIS — M25552 Pain in left hip: Secondary | ICD-10-CM | POA: Diagnosis not present

## 2014-11-23 DIAGNOSIS — M25551 Pain in right hip: Secondary | ICD-10-CM | POA: Diagnosis not present

## 2014-11-23 DIAGNOSIS — R262 Difficulty in walking, not elsewhere classified: Secondary | ICD-10-CM

## 2014-11-23 DIAGNOSIS — G5700 Lesion of sciatic nerve, unspecified lower limb: Secondary | ICD-10-CM

## 2014-11-23 NOTE — Patient Instructions (Signed)
3 way piriformis stretch     

## 2014-11-23 NOTE — Therapy (Signed)
[facility] [address] [place], [place], [address] Phone: [phone]   Fax:  [phone]  Physical Therapy Evaluation  Patient Details  Name: [name] MRN: [id number] Date of Birth: 24-Jul-1956  Encounter Date: 11/23/2014      PT End of Session - 11/23/14 1903    Visit Number 1   Number of Visits 16   Date for PT Re-Evaluation 12/23/14   Authorization Type [employer]   Authorization - Visit Number 1   Authorization - Number of Visits 16   PT Start Time 1626   PT Stop Time 1645   PT Time Calculation (min) 19 min      Past Medical History  Diagnosis Date  . PONV (postoperative nausea and vomiting)   . Hypertension   . Depression   . GERD (gastroesophageal reflux disease)   . Arthritis   . Anxiety   . Hypothyroidism   . Hypothyroid     Past Surgical History  Procedure Laterality Date  . Cholecystectomy    . Tubal ligation    . Vaginal hysterectomy    . Tonsillectomy    . Back surgery    . Carpal tunnel release  06/16/2012    Procedure: CARPAL TUNNEL RELEASE;  Surgeon: Dominica SeverinWilliam Gramig, MD;  Location: [facility];  Service: Orthopedics;  Laterality: Right;  limited open carpal tunnel release  . Trigger finger release  06/16/2012    Procedure: RELEASE TRIGGER FINGER/A-1 PULLEY;  Surgeon: Dominica SeverinWilliam Gramig, MD;  Location: [facility];  Service: Orthopedics;  Laterality: Right;  right middle a-1 release  . Colonoscopy    . Upper gastrointestinal endoscopy      There were no vitals taken for this visit.  Visit Diagnosis:  Piriformis syndrome, unspecified laterality  Hip stiffness, unspecified laterality  Weakness of both hips  Difficulty walking      Subjective Assessment - 11/23/14 1627    Symptoms "I have to pay attention to every move i make with my back because of the pain."    Pertinent History Patient had surgery in 2007 on her low back to remove/correct bulging disc,  patient now complains of weakness in her back that has been worsening as well as severity of pain. radiating pain bilaterally  Rt > Lt,  patient notes " it feel tight in my back and in my hip with going up steps. Worse pain with beiding over, vaccuming and bending to pick up something.  "It  feels worse than last time."   Limitations Walking   How long can you walk comfortably? 2 hours   Currently in Pain? Yes   Pain Score 6    Pain Location Back   Pain Orientation Right;Left;Posterior;Lower   Pain Descriptors / Indicators Aching   Pain Type Chronic pain   Pain Radiating Towards bilateral hips and posterior thigh   Pain Onset More than a month ago   Pain Frequency Constant   Aggravating Factors  bending over and picking things up from the floor.    Pain Relieving Factors heat, medication, relazx in hot  bath, lay on back, and get pressure off back.           [facility] PT Assessment - 11/23/14 0001    Assessment   Medical Diagnosis Piriformis Syndrome and possible sacroiliac dysunction   Onset Date 11/23/06   Next MD Visit Ardyth GalWilliam Luking    Prior Therapy No   Precautions   Precautions None  Restrictions   Weight Bearing Restrictions No   Balance Screen   Has the patient fallen in the past 6 months No   Prior Function   Level of Independence Independent with basic ADLs   Cognition   Overall Cognitive Status Within Functional Limits for tasks assessed   Sit to Stand   Comments occasional need for UE assist for    Other:   Other/ Comments Gait: limited ability to toe in, normal toe out, and good foor positioning otherwise, limited rotation.    AROM   Right Hip External Rotation  55   Right Hip Internal Rotation  26   Left Hip External Rotation  45   Left Hip Internal Rotation  30   Lumbar Flexion 20   Lumbar Extension 20   Strength   Right Hip ABduction 2-/5   Left Hip Extension 2-/5   Left Hip ABduction 2-/5   Lumbar Flexion 2-/5   Lumbar Extension 2-/5                           PT Education - 11/23/14 1854    Education provided Yes   Education Details Patient educated on pathology and treatment plan for piriformis syndorome as well as given piriformis stretch.    Person(s) Educated Patient   Methods Explanation;Demonstration;Handout;Tactile cues   Comprehension Verbalized understanding;Returned demonstration          PT Short Term Goals - 11/23/14 1909    PT SHORT TERM GOAL #1   Title Patient will displasy increased hip internal rotation bilaterally to 36 degrees to increase deceleration mechanics of gait   Time 4   Period Weeks   Status New   PT SHORT TERM GOAL #2   Title Patint will dmeonstrate negative hamstring90/90 texh of <10  from full knee extension   Time 4   Period Weeks   Status New   PT SHORT TERM GOAL #3   Title Patient will dmeosntrate independence with home stretching program   Time 4   Period Weeks   Status New   PT SHORT TERM GOAL #4   Title Patint will dmeonstrate glut max/med strength of 3+/5 MMT o indicate improving LE strength.           PT Long Term Goals - 11/23/14 1911    PT LONG TERM GOAL #1   Title Patient will displasy increased hip internal rotation bilaterally to 40 degrees to increase deceleration mechanics of gait   Time 8   Period Weeks   Status New   PT LONG TERM GOAL #2   Title Patint will dmeonstrate glut max/med strength of 4-/5 MMT o indicate improving LE strength to be bale to single leg stand >10 seconds each   Time 8   Period Weeks   Status New   PT LONG TERM GOAL #3   Title Patient will be able to forward flex to 90 degrees to touch toes and return to standing without use of UE indicating thrunk extension strength of 3+/5 MMT   Time 8   Period Weeks   Status New   PT LONG TERM GOAL #4   Title Patient will be able to demosntrate trunk flexion strength of 4+/5 MMT to indicate improved trunk stability   Time 8   Period Weeks   Status New   PT LONG TERM  GOAL #5   Title Patient will state <2/10 pain with walking and sitting activities lasting longer than  30minutes.    Time 8   Period Weeks   Status New               Plan - 11/23/14 1904    Clinical Impression Statement Patient arrived 20 minutes late limited examination. Patient displays signs and symptoms consistent with piriformis syndrom secondary to hamstring, glut max, glut med and abdominal weakness and limited piriformis AROM resulting in pain with walking and prolonged sitting.Ppatient will benefit from skilled physical therapy to decrease pain and return to performing normal ADL and work without difficulty.    Pt will benefit from skilled therapeutic intervention in order to improve on the following deficits Abnormal gait;Pain;Decreased strength;Difficulty walking;Decreased mobility;Decreased activity tolerance;Decreased balance;Decreased range of motion;Decreased endurance   Rehab Potential Good   PT Frequency 2x / week   PT Duration 8 weeks   PT Treatment/Interventions Therapeutic exercise;Balance training;Passive range of motion;Patient/family education;Gait training;Stair training;Manual techniques;Traction   PT Next Visit Plan Introduce hamstring, calf, piriformis, and prone extension stretches. and introduce stanidng mobility exercises including 2 way big reach walk, 3D hip excursions and squat walk arounds per paion tolerance.    PT Home Exercise Plan piriformis stretch   Consulted and Agree with Plan of Care Patient         Problem List Patient Active Problem List   Diagnosis Date Noted  . Rosacea 03/07/2014  . Chronic low back pain 10/22/2013  . Morbid obesity 10/03/2013  . Anxiety 08/23/2013  . Depression 05/15/2013  . Fatigue 05/15/2013  . Urinary incontinence in female 02/23/2013  . GERD (gastroesophageal reflux disease) 09/19/2012  . Intestinal bacterial overgrowth 07/25/2012  . Hypertension 07/25/2012  . Hypothyroidism 07/25/2012  . Constipation  07/25/2012  . CTS (carpal tunnel syndrome) 04/21/2012  . Trigger finger 04/21/2012    Jerilee Fieldash Leen Tworek PT DPT [phone]   [facility] [address] [place], [place], [address] Phone: [phone]   Fax:  [phone]

## 2014-11-26 ENCOUNTER — Ambulatory Visit (HOSPITAL_COMMUNITY)
Admission: RE | Admit: 2014-11-26 | Discharge: 2014-11-26 | Disposition: A | Discharge status: Home / Self Care | Payer: 59 | Source: Ambulatory Visit | Attending: Family Medicine | Admitting: Family Medicine

## 2014-11-26 DIAGNOSIS — R262 Difficulty in walking, not elsewhere classified: Secondary | ICD-10-CM

## 2014-11-26 DIAGNOSIS — G5700 Lesion of sciatic nerve, unspecified lower limb: Secondary | ICD-10-CM

## 2014-11-26 DIAGNOSIS — Z5189 Encounter for other specified aftercare: Secondary | ICD-10-CM | POA: Diagnosis not present

## 2014-11-26 DIAGNOSIS — M25659 Stiffness of unspecified hip, not elsewhere classified: Secondary | ICD-10-CM

## 2014-11-26 DIAGNOSIS — R29898 Other symptoms and signs involving the musculoskeletal system: Secondary | ICD-10-CM

## 2014-11-26 NOTE — Therapy (Signed)
[facility] [facility] [address] [place], [place], [address] Phone: [phone]   Fax:  [phone]  Physical Therapy Treatment  Patient Details  Name: Audrey DoughtyCathy H [name] MRN: [id number] Date of Birth: 14-Jul-1956  Encounter Date: 11/26/2014      PT End of Session - 11/26/14 1126    Visit Number 2   Number of Visits 16   Date for PT Re-Evaluation 12/23/14   Authorization Type [employer]   Authorization - Visit Number 2   Authorization - Number of Visits 16   PT Start Time 1028   PT Stop Time 1105   PT Time Calculation (min) 37 min      Past Medical History  Diagnosis Date  . PONV (postoperative nausea and vomiting)   . Hypertension   . Depression   . GERD (gastroesophageal reflux disease)   . Arthritis   . Anxiety   . Hypothyroidism   . Hypothyroid     Past Surgical History  Procedure Laterality Date  . Cholecystectomy    . Tubal ligation    . Vaginal hysterectomy    . Tonsillectomy    . Back surgery    . Carpal tunnel release  06/16/2012    Procedure: CARPAL TUNNEL RELEASE;  Surgeon: Dominica SeverinWilliam Gramig, MD;  Location: [facility];  Service: Orthopedics;  Laterality: Right;  limited open carpal tunnel release  . Trigger finger release  06/16/2012    Procedure: RELEASE TRIGGER FINGER/A-1 PULLEY;  Surgeon: Dominica SeverinWilliam Gramig, MD;  Location: [facility];  Service: Orthopedics;  Laterality: Right;  right middle a-1 release  . Colonoscopy    . Upper gastrointestinal endoscopy      There were no vitals taken for this visit.  Visit Diagnosis:  Piriformis syndrome, unspecified laterality  Hip stiffness, unspecified laterality  Weakness of both hips  Difficulty walking      Subjective Assessment - 11/26/14 1125    Symptoms Pt states certain movements increase her pain. Mostly into Rt SI and hip region.  Currently with 4/10 pain.   Currently in Pain? Yes   Pain Score 4    Pain Location Back   Pain  Orientation Right                    [facility] Adult PT Treatment/Exercise - 11/26/14 1041    Lumbar Exercises: Stretches   Active Hamstring Stretch 2 reps;30 seconds   Active Hamstring Stretch Limitations standing with 12" step   Passive Hamstring Stretch Limitations   Passive Hamstring Stretch Limitations gastroc stretch with slant board 3X30"   Piriformis Stretch 3 reps;30 seconds;Limitations   Piriformis Stretch Limitations 3 ways seated   Lumbar Exercises: Standing   Other Standing Lumbar Exercises hip excursions 3 way 10 reps                  PT Short Term Goals - 11/26/14 1131    PT SHORT TERM GOAL #1   Title Patient will displasy increased hip internal rotation bilaterally to 36 degrees to increase deceleration mechanics of gait   Time 4   Period Weeks   Status New   PT SHORT TERM GOAL #2   Title Patint will dmeonstrate negative hamstring90/90 texh of <10  from full knee extension   Time 4   Period Weeks   Status New   PT SHORT TERM GOAL #3   Title Patient will dmeosntrate independence with home stretching program   Time 4  Period Weeks   Status New   PT SHORT TERM GOAL #4   Title Patint will dmeonstrate glut max/med strength of 3+/5 MMT o indicate improving LE strength.           PT Long Term Goals - 11/26/14 1131    PT LONG TERM GOAL #1   Title Patient will displasy increased hip internal rotation bilaterally to 40 degrees to increase deceleration mechanics of gait   Time 8   Period Weeks   Status New   PT LONG TERM GOAL #2   Title Patint will dmeonstrate glut max/med strength of 4-/5 MMT o indicate improving LE strength to be bale to single leg stand >10 seconds each   Time 8   Period Weeks   Status New   PT LONG TERM GOAL #3   Title Patient will be able to forward flex to 90 degrees to touch toes and return to standing without use of UE indicating thrunk extension strength of 3+/5 MMT   Time 8   Period Weeks   Status New   PT LONG  TERM GOAL #4   Title Patient will be able to demosntrate trunk flexion strength of 4+/5 MMT to indicate improved trunk stability   Time 8   Period Weeks   Status New   PT LONG TERM GOAL #5   Title Patient will state <2/10 pain with walking and sitting activities lasting longer than 30minutes.    Time 8   Period Weeks   Status New               Plan - 11/26/14 1127    Clinical Impression Statement Initiated therex per PT POC adding hip excursions in all 3 planes, hamstring/gastroc/piriformis stretches.  Unable to complete full program due to scheduling.   Pt reported no pain at end of session.  Given written instructions for hip excursion.   PT Next Visit Plan Introduce prone extension stretches,  2 way big reach walk and squat walk arounds per pain tolerance.   Update HEP as appropriate.   PT Home Exercise Plan hip excursion sheet given to patient.         Problem List Patient Active Problem List   Diagnosis Date Noted  . Rosacea 03/07/2014  . Chronic low back pain 10/22/2013  . Morbid obesity 10/03/2013  . Anxiety 08/23/2013  . Depression 05/15/2013  . Fatigue 05/15/2013  . Urinary incontinence in female 02/23/2013  . GERD (gastroesophageal reflux disease) 09/19/2012  . Intestinal bacterial overgrowth 07/25/2012  . Hypertension 07/25/2012  . Hypothyroidism 07/25/2012  . Constipation 07/25/2012  . CTS (carpal tunnel syndrome) 04/21/2012  . Trigger finger 04/21/2012    Lurena Nidamy B Kolby Myung, PTA/CLT [phone] 11/26/2014, 11:31 AM  [place] [facility] [address] [place], [place], [address] Phone: [phone]   Fax:  [phone]

## 2014-11-27 ENCOUNTER — Encounter (HOSPITAL_COMMUNITY): Payer: 59 | Admitting: Physical Therapy

## 2014-12-05 ENCOUNTER — Ambulatory Visit (HOSPITAL_COMMUNITY)
Admission: RE | Admit: 2014-12-05 | Discharge: 2014-12-05 | Disposition: A | Discharge status: Home / Self Care | Payer: 59 | Source: Ambulatory Visit | Attending: Family Medicine | Admitting: Family Medicine

## 2014-12-05 ENCOUNTER — Encounter (HOSPITAL_COMMUNITY): Payer: Self-pay

## 2014-12-05 DIAGNOSIS — M25659 Stiffness of unspecified hip, not elsewhere classified: Secondary | ICD-10-CM

## 2014-12-05 DIAGNOSIS — G5700 Lesion of sciatic nerve, unspecified lower limb: Secondary | ICD-10-CM

## 2014-12-05 DIAGNOSIS — Z5189 Encounter for other specified aftercare: Secondary | ICD-10-CM | POA: Diagnosis not present

## 2014-12-05 DIAGNOSIS — R29898 Other symptoms and signs involving the musculoskeletal system: Secondary | ICD-10-CM

## 2014-12-05 DIAGNOSIS — R262 Difficulty in walking, not elsewhere classified: Secondary | ICD-10-CM

## 2014-12-05 NOTE — Therapy (Signed)
[facility] [address] [place], [place], [address] Phone: [phone]   Fax:  [phone]  Physical Therapy Treatment  Patient Details  Name: Audrey DoughtyCathy H [name] MRN: [id number] Date of Birth: 06/15/1956  Encounter Date: 12/05/2014      PT End of Session - 12/05/14 1304    Visit Number 3   Number of Visits 16   Date for PT Re-Evaluation 12/23/14   Authorization Type [employer]   Authorization - Visit Number 3   Authorization - Number of Visits 16   PT Start Time 1154   PT Stop Time 1232   PT Time Calculation (min) 38 min   Activity Tolerance Patient tolerated treatment well   Behavior During Therapy [facility] for tasks assessed/performed      Past Medical History  Diagnosis Date  . PONV (postoperative nausea and vomiting)   . Hypertension   . Depression   . GERD (gastroesophageal reflux disease)   . Arthritis   . Anxiety   . Hypothyroidism   . Hypothyroid     Past Surgical History  Procedure Laterality Date  . Cholecystectomy    . Tubal ligation    . Vaginal hysterectomy    . Tonsillectomy    . Back surgery    . Carpal tunnel release  06/16/2012    Procedure: CARPAL TUNNEL RELEASE;  Surgeon: Dominica SeverinWilliam Gramig, MD;  Location: [facility];  Service: Orthopedics;  Laterality: Right;  limited open carpal tunnel release  . Trigger finger release  06/16/2012    Procedure: RELEASE TRIGGER FINGER/A-1 PULLEY;  Surgeon: Dominica SeverinWilliam Gramig, MD;  Location: [facility];  Service: Orthopedics;  Laterality: Right;  right middle a-1 release  . Colonoscopy    . Upper gastrointestinal endoscopy      There were no vitals taken for this visit.  Visit Diagnosis:  Piriformis syndrome, unspecified laterality  Hip stiffness, unspecified laterality  Weakness of both hips  Difficulty walking      Subjective Assessment - 12/05/14 1156    Symptoms Pt stated increased back pain following clearning up christmas  decor, current pain scale 7/10 Bil buttock Rt> LT   Currently in Pain? Yes   Pain Score 7    Pain Location Back   Pain Orientation Lower;Right;Left                    [facility] Adult PT Treatment/Exercise - 12/05/14 1304    Lumbar Exercises: Stretches   Active Hamstring Stretch 3 reps;30 seconds   Active Hamstring Stretch Limitations 14in step 3 way   Prone on Elbows Stretch Limitations   Prone on Elbows Stretch Limitations 2 minutes   Press Ups 5 reps;10 seconds   Quad Stretch 3 reps;30 seconds   Quad Stretch Limitations prone with rope   ITB Stretch 2 reps;30 seconds   ITB Stretch Limitations 6in step Bil   Piriformis Stretch 3 reps;30 seconds;Limitations   Piriformis Stretch Limitations 3 ways seated   Lumbar Exercises: Standing   Other Standing Lumbar Exercises 3D hip excursion 10x   Other Standing Lumbar Exercises 2 way big reach walk                 PT Education - 12/05/14 1309    Education provided Yes   Education Details Benefits of prone for lumbar extension, educated piriformis muscle location and sciatic symtoms   Person(s) Educated Patient   Methods Explanation;Demonstration   Comprehension Verbalized understanding;Returned demonstration  PT Short Term Goals - 12/05/14 1308    PT SHORT TERM GOAL #1   Title Patient will displasy increased hip internal rotation bilaterally to 36 degrees to increase deceleration mechanics of gait   PT SHORT TERM GOAL #2   Title Patint will dmeonstrate negative hamstring90/90 texh of <10  from full knee extension   Status On-going   PT SHORT TERM GOAL #3   Title Patient will dmeosntrate independence with home stretching program   Status On-going   PT SHORT TERM GOAL #4   Title Patint will dmeonstrate glut max/med strength of 3+/5 MMT o indicate improving LE strength.   Status On-going           PT Long Term Goals - 12/05/14 1309    PT LONG TERM GOAL #1   Title Patient will displasy increased hip  internal rotation bilaterally to 40 degrees to increase deceleration mechanics of gait   PT LONG TERM GOAL #2   Title Patint will dmeonstrate glut max/med strength of 4-/5 MMT o indicate improving LE strength to be bale to single leg stand >10 seconds each   PT LONG TERM GOAL #3   Title Patient will be able to forward flex to 90 degrees to touch toes and return to standing without use of UE indicating thrunk extension strength of 3+/5 MMT   PT LONG TERM GOAL #4   Title Patient will be able to demosntrate trunk flexion strength of 4+/5 MMT to indicate improved trunk stability   PT LONG TERM GOAL #5   Title Patient will state <2/10 pain with walking and sitting activities lasting longer than 30minutes.                Plan - 12/05/14 1305    Clinical Impression Statement Began 2way big reach walk to improve hio mobility, continued 3D hip excursion and stretches to improve LE fleixibilty.  Added prone quad stretch for flexibilty and prone on elbows to improve lumbar extension.  Pt educated on benefits of prone position for extension.     PT Next Visit Plan Begin squat walk around to improve IR per pain tolerance.  Begin thomas stretch for rectus femoris flexibilty.  Update HEP as appropriate.        Problem List Patient Active Problem List   Diagnosis Date Noted  . Rosacea 03/07/2014  . Chronic low back pain 10/22/2013  . Morbid obesity 10/03/2013  . Anxiety 08/23/2013  . Depression 05/15/2013  . Fatigue 05/15/2013  . Urinary incontinence in female 02/23/2013  . GERD (gastroesophageal reflux disease) 09/19/2012  . Intestinal bacterial overgrowth 07/25/2012  . Hypertension 07/25/2012  . Hypothyroidism 07/25/2012  . Constipation 07/25/2012  . CTS (carpal tunnel syndrome) 04/21/2012  . Trigger finger 04/21/2012   [name], LPTA [phone]  Audrey BurrowCockerham, [name] 12/05/2014, 1:11 PM  [facility] [address] [place], [place], [address] Phone: [phone]   Fax:  [phone]

## 2014-12-10 ENCOUNTER — Ambulatory Visit (HOSPITAL_COMMUNITY): Payer: 59 | Admitting: Physical Therapy

## 2014-12-11 ENCOUNTER — Encounter (HOSPITAL_COMMUNITY): Payer: Self-pay | Admitting: Physical Therapy

## 2014-12-11 NOTE — Therapy (Signed)
[place], [place], [address] Phone: [phone]   Fax:  [phone]  Patient Details  Name: [name] MRN: [id number] Date of Birth: December 03, 1956  Encounter Date: 12/11/2014 PHYSICAL THERAPY DISCHARGE SUMMARY  Visits from Start of Care: 3  Patient self discharges due to limited financial resources  Current functional level related to goals / functional outcomes: PT SHORT TERM GOAL #1    Title Patient will displasy increased hip internal rotation bilaterally to 36 degrees to increase deceleration mechanics of gait Not met   PT SHORT TERM GOAL #2   Title Patint will dmeonstrate negative hamstring90/90 texh of <10 from full knee extension   Status Not met   PT SHORT TERM GOAL #3   Title Patient will dmeosntrate independence with home stretching program   Status Mot Met   PT SHORT TERM GOAL #4   Title Patint will dmeonstrate glut max/med strength of 3+/5 MMT o indicate improving LE strength.   Status Not Met           PT Long Term Goals - 12/05/14 1309    PT LONG TERM GOAL #1   Title Patient will displasy increased hip internal rotation bilaterally to 40 degrees to increase deceleration mechanics of gait Not Met   PT LONG TERM GOAL #2   Title Patint will dmeonstrate glut max/med strength of 4-/5 MMT o indicate improving LE strength to be bale to single leg stand >10 seconds each Not Met   PT LONG TERM GOAL #3   Title Patient will be able to forward flex to 90 degrees to touch toes and return to standing without use of UE indicating thrunk extension strength of 3+/5 MMT Not Met   PT LONG TERM GOAL #4   Title Patient will be able to demosntrate trunk flexion strength of 4+/5 MMT to indicate improved trunk stability Not Met   PT LONG TERM GOAL #5   Title Patient will state <2/10 pain with walking and sitting activities lasting longer than 23minutes.   Not Met    Plan: Patient agrees to discharge.  Patient goals were not met. Patient is being discharged due to financial reasons.  ?????       Devona Konig PT DPT [place] [address] [place], [place], [address] Phone: [phone]   Fax:  [phone]

## 2014-12-12 ENCOUNTER — Ambulatory Visit (HOSPITAL_COMMUNITY): Payer: 59

## 2014-12-14 ENCOUNTER — Ambulatory Visit (HOSPITAL_COMMUNITY): Payer: 59 | Admitting: Physical Therapy

## 2014-12-18 ENCOUNTER — Encounter (HOSPITAL_COMMUNITY): Payer: 59 | Admitting: Physical Therapy

## 2014-12-20 ENCOUNTER — Encounter (HOSPITAL_COMMUNITY): Payer: 59 | Admitting: Physical Therapy

## 2014-12-25 ENCOUNTER — Encounter (HOSPITAL_COMMUNITY): Payer: 59 | Admitting: Physical Therapy

## 2014-12-27 ENCOUNTER — Encounter (HOSPITAL_COMMUNITY): Payer: 59 | Admitting: Physical Therapy

## 2015-01-01 ENCOUNTER — Encounter (HOSPITAL_COMMUNITY): Payer: 59 | Admitting: Physical Therapy

## 2015-01-03 ENCOUNTER — Encounter (HOSPITAL_COMMUNITY): Payer: 59 | Admitting: Physical Therapy

## 2015-01-08 ENCOUNTER — Encounter (HOSPITAL_COMMUNITY): Payer: 59 | Admitting: Physical Therapy

## 2015-01-10 ENCOUNTER — Encounter (HOSPITAL_COMMUNITY): Payer: 59 | Admitting: Physical Therapy

## 2015-01-15 ENCOUNTER — Encounter (HOSPITAL_COMMUNITY): Payer: 59 | Admitting: Physical Therapy

## 2015-01-16 ENCOUNTER — Encounter: Payer: Self-pay | Admitting: Family Medicine

## 2015-01-16 ENCOUNTER — Ambulatory Visit (INDEPENDENT_AMBULATORY_CARE_PROVIDER_SITE_OTHER): Payer: 59 | Admitting: Family Medicine

## 2015-01-16 VITALS — BP 132/88 | Temp 98.0°F | Ht 62.0 in | Wt 179.0 lb

## 2015-01-16 DIAGNOSIS — J329 Chronic sinusitis, unspecified: Secondary | ICD-10-CM

## 2015-01-16 DIAGNOSIS — J310 Chronic rhinitis: Secondary | ICD-10-CM

## 2015-01-16 MED ORDER — DOXYCYCLINE HYCLATE 100 MG PO TABS: 100.0000 mg | ORAL_TABLET | Freq: Two times a day (BID) | ORAL | Status: DC

## 2015-01-16 NOTE — Progress Notes (Signed)
   Subjective:    Patient ID: [name], female    DOB: 1955/12/22, 59 y.o.   MRN: [id number]  Cough This is a new problem. The current episode started 1 to 4 weeks ago. Associated symptoms include headaches. Treatments tried: sinus meds.  knot on inside of right nostril. Painful to touch. Came up about 2 weeks ago.   Swelling and tender and stufy  Coming on about two wks  sudaphedrine  Question low gr fever    Pressure and tender and sore    Review of Systems  Respiratory: Positive for cough.   Neurological: Positive for headaches.       Objective:   Physical Exam  Alert moderate malaise. Vital stable lungs clear heart rare rhythm nasal maxillary congestion fullness and tenderness right side of nostril slightly swollen and tender to palpation.      Assessment & Plan:  Impression rhinitis with extensions rhinosinusitis. With soft tissue swelling would be wise to cover for staph component. Discussed with patient plan doxy 100 twice a day 14 days. Since Medicare discussed. WSL

## 2015-01-17 ENCOUNTER — Encounter (HOSPITAL_COMMUNITY): Payer: 59 | Admitting: Physical Therapy

## 2015-01-17 ENCOUNTER — Other Ambulatory Visit: Payer: Self-pay | Admitting: *Deleted

## 2015-01-17 MED ORDER — LISINOPRIL 5 MG PO TABS: 5.0000 mg | ORAL_TABLET | Freq: Every day | ORAL | Status: DC

## 2015-01-17 MED ORDER — LEVOTHYROXINE SODIUM 137 MCG PO TABS: ORAL_TABLET | ORAL | Status: DC

## 2015-01-22 ENCOUNTER — Encounter (HOSPITAL_COMMUNITY): Payer: 59 | Admitting: Physical Therapy

## 2015-01-24 ENCOUNTER — Encounter (HOSPITAL_COMMUNITY): Payer: 59 | Admitting: Physical Therapy

## 2015-01-29 ENCOUNTER — Encounter (HOSPITAL_COMMUNITY): Payer: 59 | Admitting: Physical Therapy

## 2015-01-31 ENCOUNTER — Encounter (HOSPITAL_COMMUNITY): Payer: 59 | Admitting: Physical Therapy

## 2015-02-11 ENCOUNTER — Ambulatory Visit: Payer: 59 | Admitting: Nurse Practitioner

## 2015-02-15 ENCOUNTER — Ambulatory Visit (INDEPENDENT_AMBULATORY_CARE_PROVIDER_SITE_OTHER): Payer: 59 | Admitting: Family Medicine

## 2015-02-15 ENCOUNTER — Encounter: Payer: Self-pay | Admitting: Family Medicine

## 2015-02-15 VITALS — BP 130/78 | Ht 62.0 in | Wt 178.0 lb

## 2015-02-15 DIAGNOSIS — K219 Gastro-esophageal reflux disease without esophagitis: Secondary | ICD-10-CM

## 2015-02-15 DIAGNOSIS — I10 Essential (primary) hypertension: Secondary | ICD-10-CM

## 2015-02-15 DIAGNOSIS — R5382 Chronic fatigue, unspecified: Secondary | ICD-10-CM | POA: Diagnosis not present

## 2015-02-15 DIAGNOSIS — E039 Hypothyroidism, unspecified: Secondary | ICD-10-CM

## 2015-02-15 DIAGNOSIS — F329 Major depressive disorder, single episode, unspecified: Secondary | ICD-10-CM

## 2015-02-15 DIAGNOSIS — F32A Depression, unspecified: Secondary | ICD-10-CM

## 2015-02-15 MED ORDER — PHENTERMINE HCL 37.5 MG PO CAPS: 37.5000 mg | ORAL_CAPSULE | ORAL | Status: DC

## 2015-02-15 NOTE — Progress Notes (Signed)
   Subjective:    Patient ID: [name], female    DOB: 07/20/1956, 59 y.o.   MRN: [id number]  Hypertension This is a chronic problem. The current episode started more than 1 year ago. The problem has been gradually improving since onset. The problem is controlled. There are no associated agents to hypertension. There are no known risk factors for coronary artery disease. Treatments tried: lisinopril. The current treatment provides significant improvement. There are no compliance problems.    Patient states that she needs a refill her cefprozil 500 BID x 10 days because she has bacterial overgrowth and her abdomen is swollen some. Pt used to go to dr Karilyn Cotarehman, worked up by dr Karilyn Cotarehman in the past.   PT helped definitely with the back, working on exdrcise lng term  Doing water aerobics three times e wkk  BP not cked elsewhere  Diffuse abd pain and constiation  Took cipro in the past for bact ovdrgrowth  Given quite often initailly but now none in two three yrs  Patient claims compliance with thyroid medicine. Ongoing fatigue.  States depression has not gotten any worse. Ongoing chronic stress.     Review of Systems No headache no chest pain some chronic low back pain some chronic abdominal pain no change about habits no blood in stool gradual weight gain noted    Objective:   Physical Exam   Alert no apparent distress vital stable good blood pressure obesity present HEENT normal. Lungs clear heart regular in rhythm. Abdomen significant adipose tissue     Assessment & Plan:  Impression 1 hypertension good control #2 hypothyroidism status uncertain #3 "bacterial overgrowth" not so sure about this advised to get back with her GI specialist #4 obesity #5 depression stable plan initiate phentermine patient request. Diet exercise strongly discussed in encourage. Appropriate blood work. Recheck in several months. WSL

## 2015-02-16 LAB — TSH: TSH: 4.872 u[IU]/mL — AB (ref 0.350–4.500)

## 2015-02-19 ENCOUNTER — Other Ambulatory Visit: Payer: Self-pay | Admitting: *Deleted

## 2015-02-19 MED ORDER — LEVOTHYROXINE SODIUM 150 MCG PO TABS: ORAL_TABLET | ORAL | Status: DC

## 2015-04-17 ENCOUNTER — Other Ambulatory Visit: Payer: Self-pay | Admitting: Family Medicine

## 2015-04-22 ENCOUNTER — Other Ambulatory Visit: Payer: Self-pay | Admitting: Family Medicine

## 2015-04-22 DIAGNOSIS — Z1231 Encounter for screening mammogram for malignant neoplasm of breast: Secondary | ICD-10-CM

## 2015-04-29 ENCOUNTER — Encounter: Payer: Self-pay | Admitting: Family Medicine

## 2015-04-29 ENCOUNTER — Ambulatory Visit (INDEPENDENT_AMBULATORY_CARE_PROVIDER_SITE_OTHER): Payer: 59 | Admitting: Family Medicine

## 2015-04-29 VITALS — BP 122/82 | Ht 62.0 in | Wt 169.8 lb

## 2015-04-29 DIAGNOSIS — M5412 Radiculopathy, cervical region: Secondary | ICD-10-CM

## 2015-04-29 MED ORDER — NAPROXEN 500 MG PO TABS: 500.0000 mg | ORAL_TABLET | Freq: Two times a day (BID) | ORAL | Status: DC

## 2015-04-29 MED ORDER — CHLORZOXAZONE 500 MG PO TABS: 500.0000 mg | ORAL_TABLET | Freq: Three times a day (TID) | ORAL | Status: DC | PRN

## 2015-04-29 NOTE — Progress Notes (Signed)
   Subjective:    Patient ID: Audrey DoughtyCathy H [name], female    DOB: 18-Nov-1956, 59 y.o.   MRN: [id number]  HPI Patient arrives with complaint of neck pain and headaches for 2 weeks-mainly at night-wonders if it is related to bursitis in shoulder or carpel tunnel in left arm She describes pain discomfort left side of the neck radiates into the left trapezius she also relates carpal tunnel in the left hand with numbness in her left hand nighttime none during the day she also states pain radiates down the arm into the elbow region but denies any loss of muscle control. Denies any muscle weakness.  Review of Systems Denies vomiting states that the neck pain that is giving her the headache. She denies a global headache. No diplopia    Objective:   Physical Exam  Paraspinal muscle tenderness on the left side trapezius tenderness left side good range of motion of the shoulder. Hyperreflexia both in the elbows wrists and knees.      Assessment & Plan:  Cervical pain along with posterior neck headaches present for the past 2 weeks radiating into the left arm this is consistent with cervical neuralgia. Anti-inflammatory massage and cold compresses. If pain persists into the next 2 weeks next step would be MRI. Patient does not have any muscle weakness but doesn't fact have hyperreflexia. She was warned that if she starts having muscle weakness on the left side or progressive pain to notify us to help set up MRI

## 2015-05-14 ENCOUNTER — Ambulatory Visit (INDEPENDENT_AMBULATORY_CARE_PROVIDER_SITE_OTHER): Payer: 59 | Admitting: Family Medicine

## 2015-05-14 ENCOUNTER — Encounter: Payer: Self-pay | Admitting: Family Medicine

## 2015-05-14 VITALS — BP 134/82 | Ht 62.0 in | Wt 169.8 lb

## 2015-05-14 DIAGNOSIS — R220 Localized swelling, mass and lump, head: Secondary | ICD-10-CM | POA: Diagnosis not present

## 2015-05-14 DIAGNOSIS — J3489 Other specified disorders of nose and nasal sinuses: Secondary | ICD-10-CM

## 2015-05-14 MED ORDER — PHENTERMINE HCL 37.5 MG PO CAPS: 37.5000 mg | ORAL_CAPSULE | ORAL | Status: DC

## 2015-05-14 NOTE — Progress Notes (Signed)
   Subjective:    Patient ID: Audrey DoughtyCathy H Rude, female    DOB: 08-28-1956, 59 y.o.   MRN: [id number]  HPI  Patient arrives for a follow up on Adipex diet medication. Patient started medication 3/16. Patient states she has a knot that is growing on the right side of nose.  Exercising more and walking trying to wztch the diet closely  Seen by dentist  Swollen and tend er later nose, now on clindamycin    Patient went to dentist but they could not tell if it was connected to tooth-they started antibiotics but it has  helped. Patient concerned that she can still feels swelling lateral part of nose. No fever or chills.  Compliant with blood pressure medicine. No obvious side effects. Has cut down salt intake.  Energy level overall improved. Trying hard in terms of exercise    day # [phone] Review of Systems No headache no chest pain no nasal discharge    Objective:   Physical Exam  Alert mild malaise. Blood pressure good on repeat lungs clear. Heart regular in rhythm. Abdomen benign. Weight down 10 pounds right lateral nose swelling evident. Dental exam normal pharynx normal      Assessment & Plan:  Impression recurrent skin infection/swelling right lateral nose. Patient very worried about something serious I doubt this is serious, though aggravating #2 hypertension good control discussed #3 weight loss discussed. Plan phentermine refill. Maintain same blood pressure medicine does. ENT referral rationale discussed. WSL plan

## 2015-06-03 ENCOUNTER — Ambulatory Visit (HOSPITAL_COMMUNITY): Payer: 59

## 2015-06-18 ENCOUNTER — Ambulatory Visit (INDEPENDENT_AMBULATORY_CARE_PROVIDER_SITE_OTHER): Payer: 59 | Admitting: Family Medicine

## 2015-06-18 ENCOUNTER — Ambulatory Visit (HOSPITAL_COMMUNITY)
Admission: RE | Admit: 2015-06-18 | Discharge: 2015-06-18 | Disposition: A | Discharge status: Home / Self Care | Payer: 59 | Source: Ambulatory Visit | Attending: Family Medicine | Admitting: Family Medicine

## 2015-06-18 ENCOUNTER — Encounter: Payer: Self-pay | Admitting: Family Medicine

## 2015-06-18 VITALS — BP 138/88 | Ht 62.0 in | Wt 168.4 lb

## 2015-06-18 DIAGNOSIS — M25512 Pain in left shoulder: Secondary | ICD-10-CM | POA: Diagnosis not present

## 2015-06-18 DIAGNOSIS — J3489 Other specified disorders of nose and nasal sinuses: Secondary | ICD-10-CM

## 2015-06-18 DIAGNOSIS — R220 Localized swelling, mass and lump, head: Secondary | ICD-10-CM

## 2015-06-18 DIAGNOSIS — M7552 Bursitis of left shoulder: Secondary | ICD-10-CM | POA: Diagnosis not present

## 2015-06-18 NOTE — Progress Notes (Signed)
   Subjective:    Patient ID: Audrey DoughtyCathy H [name], female    DOB: [birth year], 59 y.o.   MRN: [id number] Patient arrives office with 2 chief concerns. Arm Pain  The incident occurred more than 1 week ago. The incident occurred at home. There was no injury mechanism. The pain is present in the upper left arm. The quality of the pain is described as aching. The pain does not radiate. The pain is moderate. The pain has been constant since the incident. The symptoms are aggravated by movement. She has tried rest for the symptoms. The treatment provided no relief.   Pin is deep in the shoulder feels like a tooth ache. Can't lay on it, took advil and aleave    Not exercising regulaly  Carrying the beach bag,  Shoulder aching   Continues to experience swelling right side of nose. Patient had a couple rounds of antibodies which helped the pain. Did not help the swelling. Saw the ENT. They recommended no major intervention other than anti-bikes. Patient frustrated by this. Feels that she may need to have surgical approach. She went ahead and called dermatologist. Pt called dr hall's office and set up,  Patient needs a referral to Dr. Margo AyeHall for tomorrow.    Review of Systems No headache no chest pain no abdominal pain no neck pain    Objective:   Physical Exam  Alert vitals stable. Lungs clear. Heart regular in rhythm. Left deltoid tenderness impressive. Difficulty with extension. Right lateral nose soft tissue prominence. No true focal nodule      Assessment & Plan:  Intermittent probable recurrent inflame skin structure lateral nose. Agree with dermatology referral #2 left shoulder bursitis recurrent over past year. Frustrating patient.  Procedure note patient was prepped draped injected with 1 mL Depo-Medrol 2 mL Xylocaine in the deltoid region. Follow-up exercises and Codman's exercises discussed. X-ray. WSL

## 2015-06-20 ENCOUNTER — Telehealth: Payer: Self-pay | Admitting: Family Medicine

## 2015-06-20 DIAGNOSIS — R229 Localized swelling, mass and lump, unspecified: Secondary | ICD-10-CM

## 2015-06-20 NOTE — Telephone Encounter (Signed)
This patient did ask for this to be in High Priority She was in tears speaking to me an does seem quite Scared at this point.

## 2015-06-20 NOTE — Telephone Encounter (Signed)
Patient was notified that referral was put in system as high priority.

## 2015-06-20 NOTE — Telephone Encounter (Signed)
Pt [facility] a [facility] [place] for a knot that is on her nose, she has seen Dr Suszanne Connerseoh,  Dr Scharlene GlossHall's office, and you for this.  Her insurance recommenced  This doc please

## 2015-06-20 NOTE — Telephone Encounter (Signed)
With pts mental status re this issue, we need to do, soft tissue swelling lateral nose

## 2015-06-21 MED ORDER — METHYLPREDNISOLONE ACETATE 40 MG/ML IJ SUSP: 40.0000 mg | Freq: Once | INTRAMUSCULAR | Status: DC

## 2015-06-23 ENCOUNTER — Other Ambulatory Visit: Payer: Self-pay | Admitting: Nurse Practitioner

## 2015-07-02 ENCOUNTER — Other Ambulatory Visit: Payer: Self-pay | Admitting: Otolaryngology

## 2015-07-02 DIAGNOSIS — R220 Localized swelling, mass and lump, head: Secondary | ICD-10-CM

## 2015-07-02 DIAGNOSIS — L03211 Cellulitis of face: Secondary | ICD-10-CM

## 2015-07-03 ENCOUNTER — Ambulatory Visit
Admission: RE | Admit: 2015-07-03 | Discharge: 2015-07-03 | Disposition: A | Discharge status: Home / Self Care | Payer: 59 | Source: Ambulatory Visit | Attending: Otolaryngology | Admitting: Otolaryngology

## 2015-07-03 DIAGNOSIS — R220 Localized swelling, mass and lump, head: Secondary | ICD-10-CM

## 2015-07-03 DIAGNOSIS — L03211 Cellulitis of face: Secondary | ICD-10-CM

## 2015-07-03 MED ORDER — IOPAMIDOL (ISOVUE-300) INJECTION 61%
100.0000 mL | Freq: Once | INTRAVENOUS | Status: AC | PRN
  Administered 2015-07-03: 100 mL via INTRAVENOUS

## 2015-08-22 ENCOUNTER — Other Ambulatory Visit: Payer: Self-pay | Admitting: Family Medicine

## 2015-10-10 ENCOUNTER — Ambulatory Visit: Payer: 59 | Admitting: Family Medicine

## 2015-10-21 ENCOUNTER — Other Ambulatory Visit: Payer: Self-pay | Admitting: Family Medicine

## 2015-11-22 ENCOUNTER — Ambulatory Visit (INDEPENDENT_AMBULATORY_CARE_PROVIDER_SITE_OTHER): Payer: 59 | Admitting: Family Medicine

## 2015-11-22 ENCOUNTER — Encounter: Payer: Self-pay | Admitting: Family Medicine

## 2015-11-22 VITALS — BP 128/82 | Ht 61.5 in | Wt 165.1 lb

## 2015-11-22 DIAGNOSIS — E039 Hypothyroidism, unspecified: Secondary | ICD-10-CM | POA: Diagnosis not present

## 2015-11-22 DIAGNOSIS — Z13220 Encounter for screening for lipoid disorders: Secondary | ICD-10-CM

## 2015-11-22 DIAGNOSIS — I10 Essential (primary) hypertension: Secondary | ICD-10-CM

## 2015-11-22 DIAGNOSIS — Z23 Encounter for immunization: Secondary | ICD-10-CM | POA: Diagnosis not present

## 2015-11-22 DIAGNOSIS — E038 Other specified hypothyroidism: Secondary | ICD-10-CM | POA: Diagnosis not present

## 2015-11-22 DIAGNOSIS — N951 Menopausal and female climacteric states: Secondary | ICD-10-CM

## 2015-11-22 DIAGNOSIS — R5383 Other fatigue: Secondary | ICD-10-CM | POA: Diagnosis not present

## 2015-11-22 DIAGNOSIS — R232 Flushing: Secondary | ICD-10-CM

## 2015-11-22 DIAGNOSIS — Z0000 Encounter for general adult medical examination without abnormal findings: Secondary | ICD-10-CM

## 2015-11-22 DIAGNOSIS — Z79899 Other long term (current) drug therapy: Secondary | ICD-10-CM

## 2015-11-22 MED ORDER — VALACYCLOVIR HCL 1 G PO TABS: ORAL_TABLET | ORAL | Status: DC

## 2015-11-22 MED ORDER — PHENTERMINE HCL 37.5 MG PO CAPS: 37.5000 mg | ORAL_CAPSULE | ORAL | Status: DC

## 2015-11-22 NOTE — Patient Instructions (Signed)
Please call and schedule your mammogram. 

## 2015-11-22 NOTE — Progress Notes (Signed)
   Subjective:    Patient ID: [name], female    DOB: March 29, 1956, 59 y.o.   MRN: [id number]  HPI The patient comes in today for a wellness visit.  hysterectomy  A review of their health history was completed.  A review of medications was also completed.  Any needed refills: yes   Eating habits: good  Falls/  MVA accidents in past few months: none  Regular exercise: walks, swim at [employer]  Specialist pt sees on regular basis: none  Preventative health issues were discussed.   Additional concerns: needs valtrex for fever blisters, refill on adipex, patient is been off Adipex for 6 months. States it's very helpful. Would like to resume.  Has not had a mammogram yet Pt gets freq fever blisters, and easy bruising with exdrtion  Stress causes a lot of the symtoms   Colonoscopy- 12/07/2005   Patient states that she does not need a pap smear. She only wants a Breast exam today.   Menopausal symtoms hot flashes , also notes fatigue at times.  States a lot of fatigue and depression. Compliant blood pressure medication. Meds are reviewed today. Generally does not miss a dose blood pressure generally good when checked elsewhere  Review of Systems No current headache chest pain back pain abdominal pain no change in bowel habits ROS complete otherwise within normal limits    Objective:   Physical Exam  Constitutional: She is oriented to person, place, and time. She appears well-developed and well-nourished.  HENT:  Head: Normocephalic.  Right Ear: External ear normal.  Left Ear: External ear normal.  Eyes: Pupils are equal, round, and reactive to light.  Neck: Normal range of motion. No thyromegaly present.  Cardiovascular: Normal rate, regular rhythm, normal heart sounds and intact distal pulses.   No murmur heard. Pulmonary/Chest: Effort normal and breath sounds normal. No respiratory distress. She has no wheezes.  Abdominal: Soft. Bowel sounds are normal. She exhibits  no distension and no mass. There is no tenderness.  Genitourinary:  Pelvic exam not performed per patient request  Musculoskeletal: Normal range of motion. She exhibits no edema or tenderness.  Lymphadenopathy:    She has no cervical adenopathy.  Neurological: She is alert and oriented to person, place, and time. She exhibits normal muscle tone.  Skin: Skin is warm and dry.  Psychiatric: She has a normal mood and affect. Her behavior is normal.  Vitals reviewed.         Assessment & Plan:  Impression 1 wellness exam diet exercise discussed patient states mammogram pending #2 hypertension good control maintain same meds #3 hypothyroidism status uncertain plan appropriate blood work. Diet exercise discussed. Flu shot today. Yearly mammograms encouraged colonoscopy discussed WSL

## 2015-11-24 LAB — CBC WITH DIFFERENTIAL/PLATELET
Basophils Absolute: 0.1 10*3/uL (ref 0.0–0.2)
Basos: 1 %
EOS (ABSOLUTE): 0.2 10*3/uL (ref 0.0–0.4)
Eos: 4 %
Hematocrit: 43.8 % (ref 34.0–46.6)
Hemoglobin: 14.7 g/dL (ref 11.1–15.9)
Immature Grans (Abs): 0 10*3/uL (ref 0.0–0.1)
Immature Granulocytes: 0 %
Lymphocytes Absolute: 2.6 10*3/uL (ref 0.7–3.1)
Lymphs: 38 %
MCH: 32.3 pg (ref 26.6–33.0)
MCHC: 33.6 g/dL (ref 31.5–35.7)
MCV: 96 fL (ref 79–97)
Monocytes Absolute: 0.5 10*3/uL (ref 0.1–0.9)
Monocytes: 8 %
Neutrophils Absolute: 3.4 10*3/uL (ref 1.4–7.0)
Neutrophils: 49 %
Platelets: 218 10*3/uL (ref 150–379)
RBC: 4.55 x10E6/uL (ref 3.77–5.28)
RDW: 13.5 % (ref 12.3–15.4)
WBC: 6.8 10*3/uL (ref 3.4–10.8)

## 2015-11-24 LAB — HEPATIC FUNCTION PANEL
ALBUMIN: 4.4 g/dL (ref 3.5–5.5)
ALK PHOS: 81 IU/L (ref 39–117)
ALT: 21 IU/L (ref 0–32)
AST: 16 IU/L (ref 0–40)
BILIRUBIN, DIRECT: 0.09 mg/dL (ref 0.00–0.40)
Bilirubin Total: 0.3 mg/dL (ref 0.0–1.2)
TOTAL PROTEIN: 7.2 g/dL (ref 6.0–8.5)

## 2015-11-24 LAB — LIPID PANEL
CHOL/HDL RATIO: 3.9 ratio (ref 0.0–4.4)
CHOLESTEROL TOTAL: 211 mg/dL — AB (ref 100–199)
HDL: 54 mg/dL (ref >39)
LDL CALC: 134 mg/dL — AB (ref 0–99)
TRIGLYCERIDES: 113 mg/dL (ref 0–149)
VLDL CHOLESTEROL CAL: 23 mg/dL (ref 5–40)

## 2015-11-24 LAB — BASIC METABOLIC PANEL WITH GFR
BUN/Creatinine Ratio: 15 (ref 9–23)
BUN: 14 mg/dL (ref 6–24)
CO2: 22 mmol/L (ref 18–29)
Calcium: 9.8 mg/dL (ref 8.7–10.2)
Chloride: 101 mmol/L (ref 96–106)
Creatinine, Ser: 0.93 mg/dL (ref 0.57–1.00)
GFR calc Af Amer: 78 mL/min/{1.73_m2}
GFR calc non Af Amer: 67 mL/min/{1.73_m2}
Glucose: 106 mg/dL — ABNORMAL HIGH (ref 65–99)
Potassium: 4.8 mmol/L (ref 3.5–5.2)
Sodium: 141 mmol/L (ref 134–144)

## 2015-11-24 LAB — TSH: TSH: 0.35 u[IU]/mL — ABNORMAL LOW (ref 0.450–4.500)

## 2015-11-24 LAB — LUTEINIZING HORMONE: LH: 39.6 m[IU]/mL

## 2015-11-24 LAB — FOLLICLE STIMULATING HORMONE: FSH: 66.5 m[IU]/mL

## 2015-11-28 ENCOUNTER — Other Ambulatory Visit: Payer: Self-pay

## 2015-11-28 MED ORDER — LEVOTHYROXINE SODIUM 137 MCG PO CAPS: ORAL_CAPSULE | ORAL | Status: DC

## 2015-11-28 NOTE — Addendum Note (Signed)
Addended by: Jeralene PetersREWS, Dickie Cloe R on: 11/28/2015 08:23 AM   Modules accepted: Orders

## 2015-12-24 ENCOUNTER — Encounter: Payer: Self-pay | Admitting: Family Medicine

## 2015-12-24 ENCOUNTER — Ambulatory Visit (INDEPENDENT_AMBULATORY_CARE_PROVIDER_SITE_OTHER): Payer: Self-pay | Admitting: Family Medicine

## 2015-12-24 VITALS — BP 148/88 | Temp 98.9°F | Ht 62.0 in | Wt 168.0 lb

## 2015-12-24 DIAGNOSIS — E039 Hypothyroidism, unspecified: Secondary | ICD-10-CM

## 2015-12-24 DIAGNOSIS — I10 Essential (primary) hypertension: Secondary | ICD-10-CM

## 2015-12-24 MED ORDER — PHENTERMINE HCL 37.5 MG PO CAPS: 37.5000 mg | ORAL_CAPSULE | ORAL | Status: DC

## 2015-12-24 MED ORDER — LISINOPRIL 5 MG PO TABS: 5.0000 mg | ORAL_TABLET | Freq: Every day | ORAL | Status: DC

## 2015-12-24 MED ORDER — LEVOTHYROXINE SODIUM 137 MCG PO CAPS: ORAL_CAPSULE | ORAL | Status: DC

## 2015-12-24 MED ORDER — SERTRALINE HCL 100 MG PO TABS: ORAL_TABLET | ORAL | Status: DC

## 2015-12-24 MED ORDER — CIPROFLOXACIN HCL 500 MG PO TABS: 500.0000 mg | ORAL_TABLET | Freq: Two times a day (BID) | ORAL | Status: DC

## 2015-12-24 NOTE — Progress Notes (Signed)
   Subjective:    Patient ID: [name], female    DOB: February 22, 1956, 60 y.o.   MRN: [id number]  HPIHypothyroidism. Taking levothyroxine 137 mcg.   Sore throat for 2 weeks.  Throat scratchy and cough, lo gr cong and drainage and pufy, no know n exposures   Needs refill on phentermine. Requesting 90 day supply. No insuranc e at all,,  Patient notes compliance with blood pressure medicine obvious side effects  Review of Systems No headache no chest pain and back pain no abdominal pain no change in bowel habits    Objective:   Physical Exam Alert no apparent distress blood pressure improved on repeat HEENT moderate nasal congestion pharynx erythematous tender anterior nodes neck supple lungs clear. Heart regular in rhythm.       Assessment & Plan:  Impression 1 rhinosinusitis with secondary lymphadenitis #2 hypertension plan meds refilled antibiotics prescribed. Symptoms care discussed recheck in 6 months WSL

## 2016-02-28 ENCOUNTER — Ambulatory Visit (INDEPENDENT_AMBULATORY_CARE_PROVIDER_SITE_OTHER): Payer: Self-pay | Admitting: Nurse Practitioner

## 2016-02-28 ENCOUNTER — Encounter: Payer: Self-pay | Admitting: Nurse Practitioner

## 2016-02-28 VITALS — BP 140/86 | Temp 97.6°F | Ht 62.0 in | Wt 167.5 lb

## 2016-02-28 DIAGNOSIS — B9689 Other specified bacterial agents as the cause of diseases classified elsewhere: Secondary | ICD-10-CM

## 2016-02-28 DIAGNOSIS — J069 Acute upper respiratory infection, unspecified: Secondary | ICD-10-CM

## 2016-02-28 MED ORDER — ALBUTEROL SULFATE HFA 108 (90 BASE) MCG/ACT IN AERS: 2.0000 | INHALATION_SPRAY | RESPIRATORY_TRACT | Status: DC | PRN

## 2016-02-28 MED ORDER — AZITHROMYCIN 250 MG PO TABS: ORAL_TABLET | ORAL | Status: DC

## 2016-02-28 MED ORDER — HYDROCODONE-HOMATROPINE 5-1.5 MG/5ML PO SYRP: 5.0000 mL | ORAL_SOLUTION | ORAL | Status: DC | PRN

## 2016-02-28 MED ORDER — PREDNISONE 20 MG PO TABS: ORAL_TABLET | ORAL | Status: DC

## 2016-02-29 ENCOUNTER — Encounter: Payer: Self-pay | Admitting: Nurse Practitioner

## 2016-02-29 NOTE — Progress Notes (Signed)
Subjective:  Presents for complaints of cough and congestion that began about a week ago. No fever. Sore throat. Facial area headache. Head congestion. Very frequent nonproductive cough. Occasional wheezing, does not use an inhaler. Feeling of water and pressure in both of her ears. No vomiting diarrhea or abdominal pain. Taking fluids well. Voiding normal limit.  Objective:   BP 140/86 mmHg  Temp(Src) 97.6 F (36.4 C) (Oral)  Ht 5\' 2"  (1.575 m)  Wt 167 lb 8 oz (75.978 kg)  BMI 30.63 kg/m2 NAD. Alert, oriented. TMs significant clear effusion, no erythema. Pharynx injected with PND noted. Neck supple with mild soft minimally tender adenopathy. Mild shortness of breath with talking. Frequent nonproductive cough noted. No tachypnea. Normal color. Lungs clear. Breath sounds mildly diminished in general. Heart regular rate rhythm.  Assessment: Bacterial upper respiratory infection  Plan:  Meds ordered this encounter  Medications  . azithromycin (ZITHROMAX Z-PAK) 250 MG tablet    Sig: Take 2 tablets (500 mg) on  Day 1,  followed by 1 tablet (250 mg) once daily on Days 2 through 5.    Dispense:  6 each    Refill:  0    Order Specific Question:  Supervising Provider    Answer:  Merlyn AlbertLUKING, WILLIAM S [[id number]]  . predniSONE (DELTASONE) 20 MG tablet    Sig: 3 po qd x 3 d then 2 po qd x 3 d then 1 po qd x 3 d    Dispense:  18 tablet    Refill:  0    Order Specific Question:  Supervising Provider    Answer:  Merlyn AlbertLUKING, WILLIAM S [[id number]]  . albuterol (PROVENTIL HFA;VENTOLIN HFA) 108 (90 Base) MCG/ACT inhaler    Sig: Inhale 2 puffs into the lungs every 4 (four) hours as needed.    Dispense:  1 Inhaler    Refill:  0    Order Specific Question:  Supervising Provider    Answer:  Merlyn AlbertLUKING, WILLIAM S [[id number]]  . HYDROcodone-homatropine (HYCODAN) 5-1.5 MG/5ML syrup    Sig: Take 5 mLs by mouth every 4 (four) hours as needed.    Dispense:  120 mL    Refill:  0    Order Specific Question:  Supervising Provider   Answer:  Merlyn AlbertLUKING, WILLIAM S [[id number]]   Warning signs reviewed. Call back in 72 hours if no improvement, call or go to ED over the weekend if worse.

## 2016-03-12 ENCOUNTER — Telehealth: Payer: Self-pay | Admitting: Family Medicine

## 2016-03-12 ENCOUNTER — Other Ambulatory Visit: Payer: Self-pay | Admitting: Nurse Practitioner

## 2016-03-12 MED ORDER — ALBUTEROL SULFATE (2.5 MG/3ML) 0.083% IN NEBU: INHALATION_SOLUTION | RESPIRATORY_TRACT | Status: DC

## 2016-03-12 NOTE — Telephone Encounter (Signed)
Please call and get more info. Thanks.  I will be glad to call in albuterol solution for neb

## 2016-03-12 NOTE — Telephone Encounter (Signed)
Will send in albuterol Start claritin or allegra along with Nasacort AQ daily for allergies Call back next week if no improvement, sooner if she gets worse Avoid excessive exposure to pollen if this makes it worse.

## 2016-03-12 NOTE — Telephone Encounter (Signed)
Notified patient Audrey JonesCarolyn will send in albuterol Start claritin or allegra along with Nasacort AQ daily for allergies Call back next week if no improvement, sooner if she gets worse Avoid excessive exposure to pollen if this makes it worse. Patient verbalized understanding.

## 2016-03-12 NOTE — Telephone Encounter (Signed)
Pt was seen by Eber Jonesarolyn 3/24 states she took her meds and  Finished the whole pack of prednisone and still have a hard  Time breathing, chest tight and congested. Worse at night with A Little cough.   Pt does not have insurance so she was unable to afford the inhaler That was written. She does have a neb machine but would need Albuterol called in if that would help her?  [facility]

## 2016-03-12 NOTE — Telephone Encounter (Signed)
Wonders if it is allergies? She has not taken anything  For allergies lately does she need to be put on an allergy Regimen?

## 2016-03-12 NOTE — Telephone Encounter (Signed)
Patient is having chest tightness, sob and chest congestion. Not sure about color because congestion is not coming up. No fever or wheezing. What do you recommend at this point? Please send in albuterol solution to pharmacy.

## 2016-03-13 ENCOUNTER — Other Ambulatory Visit: Payer: Self-pay

## 2016-03-13 ENCOUNTER — Telehealth: Payer: Self-pay | Admitting: Nurse Practitioner

## 2016-03-13 MED ORDER — ALBUTEROL SULFATE (2.5 MG/3ML) 0.083% IN NEBU: INHALATION_SOLUTION | RESPIRATORY_TRACT | Status: DC

## 2016-03-13 NOTE — Telephone Encounter (Signed)
Called pharmacy gave a verbal order for Albuterol Nebulizer solution. Notified patient. Patient verbalized understanding.

## 2016-03-13 NOTE — Telephone Encounter (Signed)
Patients spouse says that the albuterol (PROVENTIL) (2.5 MG/3ML) 0.083% nebulizer solution is not at the pharmacy.  He wants to know if we can resend and call him when complete.    [facility]

## 2016-05-07 ENCOUNTER — Encounter: Payer: Self-pay | Admitting: Nurse Practitioner

## 2016-05-07 ENCOUNTER — Ambulatory Visit (INDEPENDENT_AMBULATORY_CARE_PROVIDER_SITE_OTHER): Payer: Self-pay | Admitting: Nurse Practitioner

## 2016-05-07 VITALS — BP 132/80 | Ht 61.0 in | Wt 166.0 lb

## 2016-05-07 DIAGNOSIS — E039 Hypothyroidism, unspecified: Secondary | ICD-10-CM

## 2016-05-07 DIAGNOSIS — R55 Syncope and collapse: Secondary | ICD-10-CM

## 2016-05-07 DIAGNOSIS — R5383 Other fatigue: Secondary | ICD-10-CM

## 2016-05-07 LAB — POCT HEMOGLOBIN: Hemoglobin: 13.5 g/dL (ref 12.2–16.2)

## 2016-05-07 LAB — POCT GLYCOSYLATED HEMOGLOBIN (HGB A1C): Hemoglobin A1C: 5.7

## 2016-05-08 ENCOUNTER — Encounter: Payer: Self-pay | Admitting: Nurse Practitioner

## 2016-05-08 LAB — TSH: TSH: 0.873 u[IU]/mL (ref 0.450–4.500)

## 2016-05-08 NOTE — Progress Notes (Signed)
Subjective:  Presents for complaints of 3 episodes of fainty feeling since April. Each episode began with feeling hot, then really dizzy, her eyes did not feel "right". Had to sit down and put her head down. Nausea but no vomiting. Following this she had a headache. Episodes last about 10 minutes. One episode occurred outside during yardwork. One occurred after getting out of her car walking to the house. The third occurred inside the house and air-conditioning. 2 episodes patient had not eaten anything in a while. No difficulty speaking or swallowing. No numbness or weakness of the face arms or legs. No palpitations or chest pain. Last episode was several weeks ago. Has not identified any specific triggers. Has been under a lot more stress. Having hot flashes related to menopause. Is due for recheck on her thyroid.  Objective:   BP 132/80 mmHg  Ht 5\' 1"  (1.549 m)  Wt 166 lb (75.297 kg)  BMI 31.38 kg/m2 NAD. Alert, oriented. Thyroid no masses recorder, nontender to palpation. Lungs clear. Heart regular rate rhythm. No murmur or gallop noted. Slight change in orthostatic BP with position change. Pupils equal and reactive to light. EOMs intact without nystagmus. Point-to-point localization normal limit. Reflexes normal limit. Romberg negative. Results for orders placed or performed in visit on 05/07/16             POCT hemoglobin  Result Value Ref Range   Hemoglobin 13.5 12.2 - 16.2 g/dL  POCT glycosylated hemoglobin (Hb A1C)  Result Value Ref Range   Hemoglobin A1C 5.7      Assessment:  Problem List Items Addressed This Visit      Endocrine   Hypothyroidism   Relevant Orders   TSH     Other   Fatigue - Primary   Relevant Orders   POCT hemoglobin (Completed)   POCT glycosylated hemoglobin (Hb A1C) (Completed)    Other Visit Diagnoses    Vasovagal episode        Relevant Orders    POCT hemoglobin (Completed)    POCT glycosylated hemoglobin (Hb A1C) (Completed)      Plan: With  no specific neurologic or cardiac symptoms and normal exam, no further workup needed at this point. A1c is just above normal at 5.7. Encouraged weight loss and healthy diet. Explained episodes could be a result of mild dehydration or hypoglycemia. Recommend adequate fluid intake and regular meals. Also use caution with sudden position change. Warning signs reviewed. Recheck if symptoms recur.

## 2016-05-14 ENCOUNTER — Telehealth: Payer: Self-pay | Admitting: Family Medicine

## 2016-05-14 NOTE — Telephone Encounter (Signed)
Patient would like result s of thyroid test from last week.Seen [name]

## 2016-05-14 NOTE — Telephone Encounter (Signed)
See lab result note.

## 2016-06-11 ENCOUNTER — Ambulatory Visit (INDEPENDENT_AMBULATORY_CARE_PROVIDER_SITE_OTHER): Payer: Self-pay | Admitting: Family Medicine

## 2016-06-11 ENCOUNTER — Encounter: Payer: Self-pay | Admitting: Family Medicine

## 2016-06-11 VITALS — BP 126/78 | Ht 62.0 in | Wt 167.0 lb

## 2016-06-11 DIAGNOSIS — M7552 Bursitis of left shoulder: Secondary | ICD-10-CM

## 2016-06-11 MED ORDER — METHYLPREDNISOLONE ACETATE 40 MG/ML IJ SUSP: 40.0000 mg | Freq: Once | INTRAMUSCULAR | Status: DC

## 2016-06-11 NOTE — Progress Notes (Signed)
   Subjective:    Patient ID: Audrey DoughtyCathy H [name], female    DOB: 11-Oct-1956, 60 y.o.   MRN: [id number]  HPI  Patient arrives with c/o left shoulder pain for 3 years.pain off and on  Started in deltoid region, worse with motion, and now into the top part of the shoulder  This flare sarted around two wks ago,    Right handed   Review of Systems No headache, no major weight loss or weight gain, no chest pain no back pain abdominal pain no change in bowel habits complete ROS otherwise negative     Objective:   Physical Exam  Alert vital stable lungs clear heart rare rhythm left shoulder positive impingement sign  Patient was prepped draped injected 1 mL Depo-Medrol 2 mL Xylocaine posterior lateral approach into left shoulder joint      Assessment & Plan:

## 2016-06-24 ENCOUNTER — Encounter: Payer: Self-pay | Admitting: Family Medicine

## 2016-06-24 ENCOUNTER — Ambulatory Visit (INDEPENDENT_AMBULATORY_CARE_PROVIDER_SITE_OTHER): Payer: Self-pay | Admitting: Family Medicine

## 2016-06-24 VITALS — Temp 98.7°F | Ht 62.0 in | Wt 165.0 lb

## 2016-06-24 DIAGNOSIS — B001 Herpesviral vesicular dermatitis: Secondary | ICD-10-CM | POA: Insufficient documentation

## 2016-06-24 DIAGNOSIS — B9689 Other specified bacterial agents as the cause of diseases classified elsewhere: Secondary | ICD-10-CM

## 2016-06-24 DIAGNOSIS — J019 Acute sinusitis, unspecified: Secondary | ICD-10-CM

## 2016-06-24 MED ORDER — LEVOFLOXACIN 500 MG PO TABS: 500.0000 mg | ORAL_TABLET | Freq: Every day | ORAL | Status: DC

## 2016-06-24 MED ORDER — VALACYCLOVIR HCL 1 G PO TABS: ORAL_TABLET | ORAL | Status: DC

## 2016-06-24 NOTE — Progress Notes (Signed)
   Subjective:    Patient ID: [name], female    DOB: [birth year], 60 y.o.   MRN: [id number]  Cough This is a new problem. The current episode started in the past 7 days. Associated symptoms include a fever, headaches and nasal congestion. Treatments tried: allegra d.   Patient with head congestion drainage coughing sinus over the past 10 days had been using allergy medicine now some hoarseness sinus pressure drainage coughing no wheezing or difficulty breathing Patient also with fever blister on the left Patient also gets blisters along the right side of her head that come when she gets sick often tender  Review of Systems  Constitutional: Positive for fever.  Respiratory: Positive for cough.   Neurological: Positive for headaches.       Objective:   Physical Exam Moderate sinus tenderness eardrums normal throat normal neck supple not toxic lungs clear heart regular That area on the right side of her head actually has a appearance of herpes it could be a shingles related issue she states she gets this repetitively she also has a fever blister      Assessment & Plan:  Viral syndrome secondary rhinosinusitis antibiotics prescribed warning signs discuss When she gets a fever blister I recommend taking 2 Valtrex in the morning 2 in the evening that should take care of it When she gets sees blisters on the side of her face I recommend one 3 times a day for 7 days

## 2016-06-25 ENCOUNTER — Other Ambulatory Visit: Payer: Self-pay

## 2016-06-25 ENCOUNTER — Telehealth: Payer: Self-pay | Admitting: Family Medicine

## 2016-06-25 MED ORDER — ACYCLOVIR 800 MG PO TABS: 800.0000 mg | ORAL_TABLET | Freq: Four times a day (QID) | ORAL | Status: DC

## 2016-06-25 MED ORDER — SULFAMETHOXAZOLE-TRIMETHOPRIM 800-160 MG PO TABS: 1.0000 | ORAL_TABLET | Freq: Two times a day (BID) | ORAL | Status: DC

## 2016-06-25 NOTE — Telephone Encounter (Signed)
Notified patient Bactrim DS 1 twice a day 10 days-avoid excessive sun if any rashes blisters or anything unusual with the medication stop the medicine. Patient verbalized understanding. Med sent to pharmacy.

## 2016-06-25 NOTE — Telephone Encounter (Signed)
Bactrim DS 1 twice a day 10 days-avoid excessive sun if any rashes blisters or anything unusual with the medication stop the medicine

## 2016-06-25 NOTE — Telephone Encounter (Signed)
Acyclovir 800 mg 1 pill 4 times a day as directed-#30, 3 refills, let the patient know that when she gets just a fever blister she can take this medicine for 3 days, when she gets the outbreak on the side of her face she should take the medicine for one week, down the road if she gets insurance I would recommend trying Valtrex, cancel Valtrex prescription in Epic please

## 2016-06-25 NOTE — Telephone Encounter (Signed)
Notified patient acyclovir 800 mg 1 pill 4 times a day as directed-#30, 3 refills, let the patient know that when she gets just a fever blister she can take this medicine for 3 days, when she gets the outbreak on the side of her face she should take the medicine for one week, down the road if she gets insurance  would recommend trying Valtrex. Med sent to pharmacy. Patient states that she cannot afford the antibiotic also. It is over $100. Needs something else sent in

## 2016-06-25 NOTE — Telephone Encounter (Signed)
Pt called stating that she has no insurance and can not afford the valtrex. Pt wants to know if a cheaper option is available. Please advise.

## 2016-08-17 ENCOUNTER — Other Ambulatory Visit: Payer: Self-pay | Admitting: Family Medicine

## 2016-08-17 NOTE — Telephone Encounter (Signed)
90 d lisinopril   Only 30 d of phentermine with two ref, controlled will not do 90 d

## 2016-09-15 ENCOUNTER — Ambulatory Visit (INDEPENDENT_AMBULATORY_CARE_PROVIDER_SITE_OTHER): Payer: Self-pay | Admitting: Nurse Practitioner

## 2016-09-15 ENCOUNTER — Encounter: Payer: Self-pay | Admitting: Nurse Practitioner

## 2016-09-15 VITALS — BP 128/84 | Temp 98.2°F | Ht 62.0 in | Wt 168.4 lb

## 2016-09-15 DIAGNOSIS — Z634 Disappearance and death of family member: Secondary | ICD-10-CM | POA: Insufficient documentation

## 2016-09-15 DIAGNOSIS — J310 Chronic rhinitis: Secondary | ICD-10-CM

## 2016-09-15 DIAGNOSIS — J329 Chronic sinusitis, unspecified: Secondary | ICD-10-CM

## 2016-09-15 DIAGNOSIS — F419 Anxiety disorder, unspecified: Secondary | ICD-10-CM

## 2016-09-15 MED ORDER — SULFAMETHOXAZOLE-TRIMETHOPRIM 800-160 MG PO TABS: 1.0000 | ORAL_TABLET | Freq: Two times a day (BID) | ORAL | 0 refills | Status: DC

## 2016-09-15 MED ORDER — ALPRAZOLAM 0.5 MG PO TABS
0.5000 mg | ORAL_TABLET | Freq: Every evening | ORAL | 2 refills | Status: DC | PRN
Start: 2016-09-15 — End: 2017-06-28

## 2016-09-15 NOTE — Progress Notes (Signed)
Subjective:  Presents for complaints of sinus congestion over the past 3 weeks. Tried Allegra-D at one point but experience very dry nose and throat. No fever. Occasional nonproductive cough. Head congestion. Itchy eyes at times. Sore throat. No ear pain but itching and pressure. Facial area headache. Also patient recently lost her sister. Struggling with her grief. Significant difficulty sleeping. Compliant with Zoloft.  Objective:   BP 128/84   Temp 98.2 F (36.8 C) (Oral)   Ht 5\' 2"  (1.575 m)   Wt 168 lb 6.4 oz (76.4 kg)   BMI 30.80 kg/m  NAD. Alert, oriented. TMs clear effusion, no erythema. Pharynx very dry, some green PND noted. Neck supple with minimal adenopathy. Lungs clear. Heart regular rate rhythm.  Assessment:  Problem List Items Addressed This Visit      Other   Anxiety   Relevant Medications   ALPRAZolam (XANAX) 0.5 MG tablet   Bereavement    Other Visit Diagnoses    Rhinosinusitis    -  Primary   Relevant Medications   sulfamethoxazole-trimethoprim (BACTRIM DS,SEPTRA DS) 800-160 MG tablet     Plan:  Meds ordered this encounter  Medications  . sulfamethoxazole-trimethoprim (BACTRIM DS,SEPTRA DS) 800-160 MG tablet    Sig: Take 1 tablet by mouth 2 (two) times daily.    Dispense:  14 tablet    Refill:  0    Order Specific Question:   Supervising Provider    Answer:   Merlyn AlbertLUKING, WILLIAM S [[id number]]  . ALPRAZolam (XANAX) 0.5 MG tablet    Sig: Take 1 tablet (0.5 mg total) by mouth at bedtime as needed for anxiety or sleep.    Dispense:  30 tablet    Refill:  2    Order Specific Question:   Supervising Provider    Answer:   Merlyn AlbertLUKING, WILLIAM S [[id number]]   Avoid decongestants. Given samples of Mucinex DM. Prescription for low-dose Xanax at bedtime for sleep. If significant grief response continues, recommend patient seek counseling at local mental health center. Note she remains uninsured. Call back if symptoms worsen or persist.

## 2016-10-15 ENCOUNTER — Telehealth: Payer: Self-pay | Admitting: Family Medicine

## 2016-10-15 MED ORDER — LEVOTHYROXINE SODIUM 137 MCG PO CAPS: ORAL_CAPSULE | ORAL | 0 refills | Status: DC

## 2016-10-15 MED ORDER — LISINOPRIL 5 MG PO TABS
5.0000 mg | ORAL_TABLET | Freq: Every day | ORAL | 0 refills | Status: DC
Start: 2016-10-15 — End: 2017-03-29

## 2016-10-15 MED ORDER — SERTRALINE HCL 100 MG PO TABS: ORAL_TABLET | ORAL | 0 refills | Status: DC

## 2016-10-15 NOTE — Telephone Encounter (Signed)
Patient called and said that all of her medications were supposed to be wrote for a 90 day supply because it is cheaper for her because she is self-pay.  The pharmacy is not filling her for a 90 day supply because they are telling her that we didn't write her prescriptions to be filled that way.   She says that she needs all her medications changed to a 90 day supply.  [facility] [place]

## 2016-10-15 NOTE — Telephone Encounter (Signed)
Notified patient 90 day worth but patient must schedule chronic ov within 30 days, has chronic condition which requires visits every six months. Patient verbalized understanding. Med sent to pharmacy.

## 2016-10-15 NOTE — Telephone Encounter (Signed)
90 d worth but pt must sched chronic o v within 30 d,has chronic cdtn which requires visits q six mo

## 2016-10-15 NOTE — Telephone Encounter (Signed)
Patient has not had check up since 1/17 and needs office visit -do you want to fill 90 day supply of meds

## 2016-11-23 ENCOUNTER — Telehealth: Payer: Self-pay | Admitting: Family Medicine

## 2016-11-23 MED ORDER — CLOBETASOL PROPIONATE 0.05 % EX CREA: 1.0000 "application " | TOPICAL_CREAM | Freq: Two times a day (BID) | CUTANEOUS | 0 refills | Status: DC

## 2016-11-23 MED ORDER — METRONIDAZOLE 1 % EX GEL: 1.0000 "application " | Freq: Every day | CUTANEOUS | 0 refills | Status: DC

## 2016-11-23 NOTE — Telephone Encounter (Signed)
metrogel apply daily lasts filled in 2015 id this correct directions also no prescriptions for clobetasol-need directions please

## 2016-11-23 NOTE — Telephone Encounter (Signed)
Ok for tboth plus three ref

## 2016-11-23 NOTE — Telephone Encounter (Signed)
Prescriptions sent electronically to pharmacy. Patient notified. °

## 2016-11-23 NOTE — Telephone Encounter (Signed)
Requesting refill on her Metronidazole gel usp for her roscea, and also the cream that she uses for her bottom, which is Clobetasol propionate usp 0.05% cream.  [facility] [place]

## 2016-11-23 NOTE — Telephone Encounter (Signed)
Bid to affected area, youll have to look up container size, if not sure of conc call pharm

## 2016-12-04 ENCOUNTER — Encounter: Payer: Self-pay | Admitting: Family Medicine

## 2016-12-04 ENCOUNTER — Ambulatory Visit (INDEPENDENT_AMBULATORY_CARE_PROVIDER_SITE_OTHER): Payer: 59 | Admitting: Family Medicine

## 2016-12-04 VITALS — BP 122/82 | Temp 97.8°F | Ht 62.0 in | Wt 171.2 lb

## 2016-12-04 DIAGNOSIS — I10 Essential (primary) hypertension: Secondary | ICD-10-CM | POA: Diagnosis not present

## 2016-12-04 DIAGNOSIS — E784 Other hyperlipidemia: Secondary | ICD-10-CM

## 2016-12-04 DIAGNOSIS — E038 Other specified hypothyroidism: Secondary | ICD-10-CM

## 2016-12-04 DIAGNOSIS — E7849 Other hyperlipidemia: Secondary | ICD-10-CM

## 2016-12-04 DIAGNOSIS — E785 Hyperlipidemia, unspecified: Secondary | ICD-10-CM | POA: Insufficient documentation

## 2016-12-04 MED ORDER — LEVOFLOXACIN 500 MG PO TABS: 500.0000 mg | ORAL_TABLET | Freq: Every day | ORAL | 0 refills | Status: DC

## 2016-12-04 MED ORDER — HYDROCODONE-HOMATROPINE 5-1.5 MG/5ML PO SYRP: 5.0000 mL | ORAL_SOLUTION | Freq: Four times a day (QID) | ORAL | 0 refills | Status: DC | PRN

## 2016-12-04 NOTE — Progress Notes (Signed)
   Subjective:    Patient ID: [name], female    DOB: [birth year], 60 y.o.   MRN: [id number]  Cough  This is a new problem. The current episode started in the past 7 days. Associated symptoms include ear pain, a fever (hot to the touch), headaches and nasal congestion. Pertinent negatives include no chest pain or shortness of breath. Treatments tried: otc meds.    Viral like illness for a few days now with head congestion drainage coughing sinus pressure  Review of Systems  Constitutional: Positive for fatigue and fever (hot to the touch).  HENT: Positive for ear pain.   Respiratory: Positive for cough. Negative for choking and shortness of breath.   Cardiovascular: Negative for chest pain.  Gastrointestinal: Positive for nausea.  Neurological: Positive for headaches.       Objective:   Physical Exam Does not appear toxic sinus mild tenderness in ears normal throat normal neck supple lungs clear heart regular       Assessment & Plan:  Viral syndrome Secondary rhinosinusitis Antibodies prescribed warning signs discussed follow-up if problems

## 2016-12-05 LAB — HEPATIC FUNCTION PANEL
ALT: 20 IU/L (ref 0–32)
AST: 17 IU/L (ref 0–40)
Albumin: 4.3 g/dL (ref 3.6–4.8)
Alkaline Phosphatase: 80 IU/L (ref 39–117)
BILIRUBIN, DIRECT: 0.05 mg/dL (ref 0.00–0.40)
Bilirubin Total: <0.2 mg/dL (ref 0.0–1.2)
Total Protein: 7.5 g/dL (ref 6.0–8.5)

## 2016-12-05 LAB — BASIC METABOLIC PANEL
BUN / CREAT RATIO: 17 (ref 12–28)
BUN: 13 mg/dL (ref 8–27)
CALCIUM: 9.6 mg/dL (ref 8.7–10.3)
CHLORIDE: 102 mmol/L (ref 96–106)
CO2: 24 mmol/L (ref 18–29)
Creatinine, Ser: 0.78 mg/dL (ref 0.57–1.00)
GFR calc Af Amer: 96 mL/min/{1.73_m2} (ref >59)
GFR calc non Af Amer: 83 mL/min/{1.73_m2} (ref >59)
GLUCOSE: 107 mg/dL — AB (ref 65–99)
Potassium: 5.1 mmol/L (ref 3.5–5.2)
Sodium: 141 mmol/L (ref 134–144)

## 2016-12-05 LAB — TSH: TSH: 4.06 u[IU]/mL (ref 0.450–4.500)

## 2016-12-05 LAB — LIPID PANEL
CHOL/HDL RATIO: 3.8 ratio (ref 0.0–4.4)
Cholesterol, Total: 207 mg/dL — ABNORMAL HIGH (ref 100–199)
HDL: 55 mg/dL (ref >39)
LDL CALC: 129 mg/dL — AB (ref 0–99)
TRIGLYCERIDES: 115 mg/dL (ref 0–149)
VLDL CHOLESTEROL CAL: 23 mg/dL (ref 5–40)

## 2016-12-07 ENCOUNTER — Encounter: Payer: Self-pay | Admitting: Family Medicine

## 2016-12-07 HISTORY — PX: CERVICAL FUSION: SHX112

## 2016-12-11 ENCOUNTER — Telehealth: Payer: Self-pay | Admitting: Family Medicine

## 2016-12-11 NOTE — Telephone Encounter (Signed)
Patient request result of thyroid test

## 2016-12-14 ENCOUNTER — Telehealth: Payer: Self-pay | Admitting: Pediatrics

## 2016-12-14 ENCOUNTER — Ambulatory Visit: Payer: Self-pay | Admitting: Family Medicine

## 2016-12-14 DIAGNOSIS — M6530 Trigger finger, unspecified finger: Secondary | ICD-10-CM

## 2016-12-14 DIAGNOSIS — G5602 Carpal tunnel syndrome, left upper limb: Secondary | ICD-10-CM

## 2016-12-14 NOTE — Telephone Encounter (Signed)
Patient states she has h/o carpel tunnel in left hand.  She states she injured her hand a few days ago and is having trouble moving it.  She states she feels like the pain and inability to move finger is due to carpel tunnel and not injury, but is not sure.    I advised her we would have to have xray to further eval situation.  Per Dr. Lubertha SouthSteve Luking I ordered x-ray and made appt for today @ 1100.  2V ordered, appt made. Pt instructed to go directly to [facility] for xray.   Patient voiced understanding and agreed with plan.

## 2016-12-15 ENCOUNTER — Other Ambulatory Visit: Payer: Self-pay | Admitting: Occupational Medicine

## 2016-12-15 ENCOUNTER — Ambulatory Visit: Payer: Self-pay

## 2016-12-15 DIAGNOSIS — M79642 Pain in left hand: Secondary | ICD-10-CM

## 2017-01-06 ENCOUNTER — Other Ambulatory Visit: Payer: Self-pay | Admitting: Family Medicine

## 2017-01-09 DIAGNOSIS — H9209 Otalgia, unspecified ear: Secondary | ICD-10-CM | POA: Diagnosis not present

## 2017-01-09 DIAGNOSIS — R52 Pain, unspecified: Secondary | ICD-10-CM | POA: Diagnosis not present

## 2017-01-09 DIAGNOSIS — J00 Acute nasopharyngitis [common cold]: Secondary | ICD-10-CM | POA: Diagnosis not present

## 2017-01-09 DIAGNOSIS — R05 Cough: Secondary | ICD-10-CM | POA: Diagnosis not present

## 2017-01-10 ENCOUNTER — Other Ambulatory Visit: Payer: Self-pay | Admitting: Family Medicine

## 2017-01-13 ENCOUNTER — Ambulatory Visit (INDEPENDENT_AMBULATORY_CARE_PROVIDER_SITE_OTHER): Payer: 59 | Admitting: Nurse Practitioner

## 2017-01-13 ENCOUNTER — Encounter: Payer: Self-pay | Admitting: Nurse Practitioner

## 2017-01-13 VITALS — BP 122/74 | Ht 62.0 in | Wt 173.0 lb

## 2017-01-13 DIAGNOSIS — G5602 Carpal tunnel syndrome, left upper limb: Secondary | ICD-10-CM | POA: Diagnosis not present

## 2017-01-13 DIAGNOSIS — E039 Hypothyroidism, unspecified: Secondary | ICD-10-CM

## 2017-01-13 MED ORDER — LEVOTHYROXINE SODIUM 150 MCG PO TABS: 150.0000 ug | ORAL_TABLET | Freq: Every day | ORAL | 0 refills | Status: DC

## 2017-01-13 NOTE — Progress Notes (Signed)
Subjective:  61 yo female seen today for discussion regarding n/t in L.hand and lab results.  History of carpel tunnel in R. Hand with surgical intervention.  N/T began approx. 10 yrs ago intermittently. It has gradually increased over the last 6 months and is now present continuously.  She sustained an injury at work , caught her hand between a cart and wall, approx. One month ago with immediate swelling, but obtained no treatment and swelling resolved on its own. Having difficulties grabbing objects d/t numbness and can grab if she is watching her hand.  Pt. Has splint for carpel tunnel, but has not used it.    Pt. Also seen in 12/17 in urgent care for extreme body aches and difficulty breathing.  Treated for resp. Infection at the time, but also instructed to follow up for abnormal thyroid symptoms.  Symptoms have improved, but TSH completed 12/29 showed significant increase from 0.873 to 4.06. Compliant with thyroid medicine.  Denies any unexplained wt.loss/gain or intolerance to cold or heat.  Denies any SOB, dyspnea, or wheezing.  Denies chest pain, palpitations, or LE swelling.    Objective:   BP 122/74   Ht 5\' 2"  (1.575 m)   Wt 173 lb (78.5 kg)   BMI 31.64 kg/m  Pt. Alert and oriented, NAD.  Neck: supple, no tenderness, no obvious masses palpable Chest: Lungs CTA Cardiac: RRR, no murmurs Musc:+tinnels, +phalen, L. Hand 4/5, R. Hand strength 5/5, bil. hands Full ROM without tenderness. Thyroid no masses; non tender to palpation.   Assessment:   Problem List Items Addressed This Visit      Endocrine   Hypothyroidism   Relevant Medications   levothyroxine (SYNTHROID, LEVOTHROID) 150 MCG tablet   Other Relevant Orders   TSH    Other Visit Diagnoses    Carpal tunnel syndrome of left wrist    -  Primary       Plan:   Meds ordered this encounter  Medications                               . levothyroxine (SYNTHROID, LEVOTHROID) 150 MCG tablet    Sig: Take 1 tablet (150 mcg  total) by mouth daily.    Dispense:  90 tablet    Refill:  0    Order Specific Question:   Supervising Provider    Answer:   Merlyn AlbertLUKING, WILLIAM S [[id number]]      OTC Saline nasal spray and vaseline to improve dryness  Symptom care ad warning signs reviewed  Brace to L.hand at night and as tolerated throughout the day Refer to Dr. Amanda PeaGramig, ortho   Repeat TSH in 3 months Increase Levothyroxine to 150mcg, one tablet, once daily Call for any chest pain, palpitations, tremors or other side effects.    Return in about 4 months (around 05/13/2017) for recheck. Recommend physical at next visit.

## 2017-01-14 ENCOUNTER — Encounter: Payer: Self-pay | Admitting: Family Medicine

## 2017-01-18 ENCOUNTER — Other Ambulatory Visit: Payer: Self-pay | Admitting: Family Medicine

## 2017-01-18 NOTE — Telephone Encounter (Signed)
Sorry, no , not address in any recent visits that I can see

## 2017-01-19 ENCOUNTER — Other Ambulatory Visit: Payer: Self-pay | Admitting: Family Medicine

## 2017-02-05 ENCOUNTER — Other Ambulatory Visit: Payer: Self-pay | Admitting: Family Medicine

## 2017-02-05 NOTE — Telephone Encounter (Signed)
Sorry needs [facility] v to [facility]

## 2017-02-22 ENCOUNTER — Other Ambulatory Visit: Payer: Self-pay | Admitting: Family Medicine

## 2017-02-22 DIAGNOSIS — Z1231 Encounter for screening mammogram for malignant neoplasm of breast: Secondary | ICD-10-CM

## 2017-02-25 DIAGNOSIS — L718 Other rosacea: Secondary | ICD-10-CM | POA: Diagnosis not present

## 2017-02-25 DIAGNOSIS — L700 Acne vulgaris: Secondary | ICD-10-CM | POA: Diagnosis not present

## 2017-03-01 MED FILL — CLINDAMYCIN PH 1% GEL: 1 | 30 days supply | Qty: 60 | Fill #0

## 2017-03-03 ENCOUNTER — Ambulatory Visit (HOSPITAL_COMMUNITY)
Admission: RE | Admit: 2017-03-03 | Discharge: 2017-03-03 | Disposition: A | Discharge status: Home / Self Care | Payer: 59 | Source: Ambulatory Visit | Attending: Family Medicine | Admitting: Family Medicine

## 2017-03-03 DIAGNOSIS — Z1231 Encounter for screening mammogram for malignant neoplasm of breast: Secondary | ICD-10-CM | POA: Diagnosis not present

## 2017-03-15 MED FILL — SERTRALINE HCL 100 MG TAB: 100 | 30 days supply | Qty: 45 | Fill #0

## 2017-03-15 MED FILL — valACYclovir HCL 1 GM TABS: 1 | 10 days supply | Qty: 30 | Fill #0

## 2017-03-15 MED FILL — LEVOTHYROXINE 137 MCG TAB: 137 | 60 days supply | Qty: 60 | Fill #0

## 2017-03-15 MED FILL — LISINOPRIL 5 MG TABLET: 5 | 30 days supply | Qty: 30 | Fill #0

## 2017-03-25 ENCOUNTER — Ambulatory Visit (INDEPENDENT_AMBULATORY_CARE_PROVIDER_SITE_OTHER): Payer: 59 | Admitting: Family Medicine

## 2017-03-25 ENCOUNTER — Encounter: Payer: Self-pay | Admitting: Family Medicine

## 2017-03-25 VITALS — Temp 97.9°F | Ht 62.0 in | Wt 170.4 lb

## 2017-03-25 DIAGNOSIS — M5432 Sciatica, left side: Secondary | ICD-10-CM | POA: Diagnosis not present

## 2017-03-25 DIAGNOSIS — J301 Allergic rhinitis due to pollen: Secondary | ICD-10-CM | POA: Diagnosis not present

## 2017-03-25 MED ORDER — PREDNISONE 20 MG PO TABS: ORAL_TABLET | ORAL | 0 refills | Status: DC

## 2017-03-25 MED ORDER — PHENTERMINE HCL 37.5 MG PO TABS: 37.5000 mg | ORAL_TABLET | Freq: Every morning | ORAL | 2 refills | Status: DC

## 2017-03-25 MED ORDER — FLUTICASONE PROPIONATE 50 MCG/ACT NA SUSP: 2.0000 | Freq: Every day | NASAL | 5 refills | Status: DC

## 2017-03-25 MED ORDER — GABAPENTIN 100 MG PO CAPS: ORAL_CAPSULE | ORAL | 3 refills | Status: DC

## 2017-03-25 NOTE — Progress Notes (Signed)
   Subjective:    Patient ID: [name], female    DOB: 16-Nov-1956, 61 y.o.   MRN: [id number]  HPI  Patient arrives with c/o left hip pain for a month and sinus symptoms. Dr Phoebe Perch Sciatica in the left leg severe wakes her up at night. Relates pain discomfort when she tries to walk. Denies any other particular injuries. States is difficult to do her job but she tries to do it is best she can unable to work currently may need a few days off Her symptoms been present for a good 5 weeks. She has tried stretching. She's tried over-the-counter measures. She has previous back surgery Review of Systems Patient with runny nose cough sneezing denies any vomiting or diarrhea relates left leg sciatica    Objective:   Physical Exam  Low back subjective discomfort in the low back positive straight leg raise on the left. Good strength in the feet. Can walk on heels and walk on toes. Reflexes are brisk bilateral. Lungs are clear hearts regular HEENT benign      Assessment & Plan:  Left leg sciatica-after long discussion patient agrees to try gabapentin 100 mg 1 daily at bedtime for 7 days then 2 daily at bedtime to see if this helps with the sciatica purposely we are starting with a low-dose to minimize side effects  Prednisone taper to help with the sciatica Stretches were shown for the lumbar spine in the back to help out with the sciatica No sign of any muscle weakness currently but patient does need follow-up in one month if ongoing troubles may need MRI referral to neurosurgery  Allergies over-the-counter allergy medicine discuss Flonase discussed as well follow-up if problems

## 2017-03-26 ENCOUNTER — Other Ambulatory Visit: Payer: Self-pay

## 2017-03-26 ENCOUNTER — Telehealth: Payer: Self-pay | Admitting: Family Medicine

## 2017-03-26 MED ORDER — FLUTICASONE PROPIONATE 50 MCG/ACT NA SUSP: 2.0000 | Freq: Every day | NASAL | 5 refills | Status: DC

## 2017-03-26 MED ORDER — PREDNISONE 20 MG PO TABS: ORAL_TABLET | ORAL | 0 refills | Status: DC

## 2017-03-26 MED ORDER — GABAPENTIN 100 MG PO CAPS: ORAL_CAPSULE | ORAL | 3 refills | Status: DC

## 2017-03-26 NOTE — Telephone Encounter (Signed)
Spoke with patient and informed her that her prescriptions were sent into [facility] in [place]. Patient verbalized understanding.

## 2017-03-26 NOTE — Telephone Encounter (Signed)
Patient had prescriptions called in yesterday to [facility].  She wants to know if these can be sent to [facility] in [place] because she can't get them through [facility] until Monday.  Also, she said she was not given her phentermine Rx yesterday and wants to know if she needs to pick that up?  [facility] [place]

## 2017-03-29 ENCOUNTER — Other Ambulatory Visit: Payer: Self-pay | Admitting: *Deleted

## 2017-03-30 MED ORDER — LISINOPRIL 5 MG PO TABS: 5.0000 mg | ORAL_TABLET | Freq: Every day | ORAL | 0 refills | Status: DC

## 2017-04-06 ENCOUNTER — Encounter: Payer: Self-pay | Admitting: Nurse Practitioner

## 2017-04-06 ENCOUNTER — Telehealth: Payer: Self-pay | Admitting: Nurse Practitioner

## 2017-04-06 ENCOUNTER — Ambulatory Visit (INDEPENDENT_AMBULATORY_CARE_PROVIDER_SITE_OTHER): Payer: 59 | Admitting: Nurse Practitioner

## 2017-04-06 VITALS — BP 132/84 | Ht 62.0 in | Wt 170.2 lb

## 2017-04-06 DIAGNOSIS — G8929 Other chronic pain: Secondary | ICD-10-CM

## 2017-04-06 DIAGNOSIS — M5442 Lumbago with sciatica, left side: Secondary | ICD-10-CM | POA: Diagnosis not present

## 2017-04-06 DIAGNOSIS — R32 Unspecified urinary incontinence: Secondary | ICD-10-CM

## 2017-04-06 LAB — POCT URINALYSIS DIPSTICK
SPEC GRAV UA: 1.02 (ref 1.010–1.025)
pH, UA: 6 (ref 5.0–8.0)

## 2017-04-06 MED ORDER — PREDNISONE 20 MG PO TABS: ORAL_TABLET | ORAL | 0 refills | Status: DC

## 2017-04-06 MED FILL — predniSONE 20 MG TABS: 20 | 9 days supply | Qty: 18 | Fill #0

## 2017-04-06 NOTE — Telephone Encounter (Signed)
Patient seen [name] this morning and was offered an excuse for work.  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

## 2017-04-07 ENCOUNTER — Encounter: Payer: Self-pay | Admitting: Nurse Practitioner

## 2017-04-07 NOTE — Progress Notes (Signed)
Subjective:  Presents for complaints of a flareup of her low back pain for the past 3 months. Generalized low back pain worse on the left side. Radiates down the mid buttock into the lateral left thigh area to the knee. Has noticed urinary incontinence specifically urge incontinence for the past month. Significant change in urination. Patient had back surgery for a bulging disc in 2008. 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 Has tried oral steroids, anti-inflammatories or back exercises. Steroids provide temporary relief but comes back when finished. No dysuria or fever.  Objective:   BP 132/84   Ht  (1.575 m)   Wt 170 lb 3.2 oz (77.2 kg)   BMI 31.13 kg/m  NAD. Alert, oriented. Lungs clear. Heart regular rate rhythm. Mild generalized tenderness in the lumbar area more on the left side. SLR negative on the right, positive on the left. Reflexes normal limit lower extremities. Gait slow but steady. Can perform heel walking but unable to perform toe walking. Flexion limited to approximately 45. Also having tenderness with rotation of the spine. Results for orders placed or performed in visit on 04/06/17  POCT urinalysis dipstick  Result Value Ref Range   Color, UA     Clarity, UA     Glucose, UA     Bilirubin, UA     Ketones, UA     Spec Grav, UA 1.020 1.010 - 1.025   Blood, UA     pH, UA 6.0 5.0 - 8.0   Protein, UA     Urobilinogen, UA  0.2 or 1.0 E.U./dL   Nitrite, UA     Leukocytes, UA  Negative    Assessment:   Problem List Items Addressed This Visit      Other   Chronic low back pain - Primary   Relevant Medications   predniSONE (DELTASONE) 20 MG tablet   Other Relevant Orders   MR LUMBAR SPINE WO CONTRAST   Urinary incontinence in female   Relevant Orders   POCT urinalysis dipstick (Completed)       Plan:   Meds ordered this encounter  Medications  . predniSONE  (DELTASONE) 20 MG tablet    Sig: 3qd for 3d then 2qd for 3d then 1qd for 3d    Dispense:  18 tablet    Refill:  0    Order Specific Question:   Supervising Provider    Answer:   Merlyn Albert [[id number]]   Restart prednisone is a temporary measure. MRI ordered. Patient is looking at other job opportunities due to the physical demands of her current position. May need referral. Call back here sooner if any problems.

## 2017-04-07 NOTE — Telephone Encounter (Signed)
Please give work note. Thanks. 

## 2017-04-08 ENCOUNTER — Encounter: Payer: Self-pay | Admitting: Nurse Practitioner

## 2017-04-08 NOTE — Telephone Encounter (Signed)
Excuse completed, notified patient via voicemail.

## 2017-04-15 MED FILL — CLINDAMYCIN PH 1% GEL: 1 | 30 days supply | Qty: 60 | Fill #1

## 2017-04-15 MED FILL — LISINOPRIL 5 MG TABLET: 5 | 90 days supply | Qty: 90 | Fill #0

## 2017-04-20 ENCOUNTER — Ambulatory Visit (HOSPITAL_COMMUNITY)
Admission: RE | Admit: 2017-04-20 | Discharge: 2017-04-20 | Disposition: A | Discharge status: Home / Self Care | Payer: 59 | Source: Ambulatory Visit | Attending: Nurse Practitioner | Admitting: Nurse Practitioner

## 2017-04-20 DIAGNOSIS — G8929 Other chronic pain: Secondary | ICD-10-CM | POA: Diagnosis not present

## 2017-04-20 DIAGNOSIS — M5442 Lumbago with sciatica, left side: Secondary | ICD-10-CM | POA: Insufficient documentation

## 2017-04-20 DIAGNOSIS — M5126 Other intervertebral disc displacement, lumbar region: Secondary | ICD-10-CM | POA: Insufficient documentation

## 2017-04-20 DIAGNOSIS — M129 Arthropathy, unspecified: Secondary | ICD-10-CM | POA: Insufficient documentation

## 2017-04-21 ENCOUNTER — Ambulatory Visit: Payer: 59 | Admitting: Family Medicine

## 2017-04-26 ENCOUNTER — Other Ambulatory Visit: Payer: Self-pay | Admitting: Family Medicine

## 2017-04-26 ENCOUNTER — Telehealth: Payer: Self-pay | Admitting: Family Medicine

## 2017-04-26 DIAGNOSIS — Z029 Encounter for administrative examinations, unspecified: Secondary | ICD-10-CM

## 2017-04-26 NOTE — Telephone Encounter (Signed)
Ok plus 2 ref will ntbs before further

## 2017-04-26 NOTE — Telephone Encounter (Signed)
Forms are for [name]([name]) patient dropped off today to be filled out. Please review and fill in highlighted areas,date/sign. In folder for review.

## 2017-04-27 MED FILL — GABAPENTIN 100 MG CAPSULE: 100 | 30 days supply | Qty: 60 | Fill #0 | Status: TO

## 2017-04-27 MED FILL — PHENTERMINE 37.5 MG TABLET: 37.5 | 30 days supply | Qty: 30 | Fill #0

## 2017-04-28 DIAGNOSIS — R200 Anesthesia of skin: Secondary | ICD-10-CM | POA: Diagnosis not present

## 2017-04-28 DIAGNOSIS — M79642 Pain in left hand: Secondary | ICD-10-CM | POA: Diagnosis not present

## 2017-04-28 DIAGNOSIS — G5602 Carpal tunnel syndrome, left upper limb: Secondary | ICD-10-CM | POA: Diagnosis not present

## 2017-04-29 ENCOUNTER — Telehealth: Payer: Self-pay | Admitting: Family Medicine

## 2017-04-29 NOTE — Telephone Encounter (Signed)
Message for [name]-patient checking on FMLA please read last message in chart was sent to primary care doctor. She needs forms by 5/29 in your box.

## 2017-04-30 ENCOUNTER — Telehealth: Payer: Self-pay | Admitting: Family Medicine

## 2017-04-30 ENCOUNTER — Other Ambulatory Visit: Payer: Self-pay | Admitting: *Deleted

## 2017-04-30 DIAGNOSIS — M549 Dorsalgia, unspecified: Secondary | ICD-10-CM

## 2017-04-30 NOTE — Telephone Encounter (Signed)
Please initiate referral in system for neurosurgery Pt wants to see Dr. Channing Muttersoy and she's already had an MRI

## 2017-04-30 NOTE — Telephone Encounter (Signed)
done

## 2017-04-30 NOTE — Telephone Encounter (Signed)
Order for referral put in.  

## 2017-05-11 ENCOUNTER — Ambulatory Visit: Payer: 59 | Admitting: Nurse Practitioner

## 2017-05-11 DIAGNOSIS — G5602 Carpal tunnel syndrome, left upper limb: Secondary | ICD-10-CM | POA: Diagnosis not present

## 2017-05-11 MED FILL — HYDROCODON-APAP 5-325: 5-325 | 4 days supply | Qty: 45 | Fill #0

## 2017-05-21 DIAGNOSIS — M47816 Spondylosis without myelopathy or radiculopathy, lumbar region: Secondary | ICD-10-CM | POA: Diagnosis not present

## 2017-05-21 DIAGNOSIS — M4712 Other spondylosis with myelopathy, cervical region: Secondary | ICD-10-CM | POA: Diagnosis not present

## 2017-05-21 DIAGNOSIS — M5126 Other intervertebral disc displacement, lumbar region: Secondary | ICD-10-CM | POA: Diagnosis not present

## 2017-05-21 MED FILL — MELOXICAM 15 MG TABLET: 15 | 30 days supply | Qty: 30 | Fill #0

## 2017-05-24 DIAGNOSIS — G5602 Carpal tunnel syndrome, left upper limb: Secondary | ICD-10-CM | POA: Diagnosis not present

## 2017-05-31 ENCOUNTER — Other Ambulatory Visit (HOSPITAL_COMMUNITY): Payer: Self-pay | Admitting: Neurosurgery

## 2017-05-31 DIAGNOSIS — M4712 Other spondylosis with myelopathy, cervical region: Secondary | ICD-10-CM

## 2017-06-04 ENCOUNTER — Ambulatory Visit (HOSPITAL_COMMUNITY)
Admission: RE | Admit: 2017-06-04 | Discharge: 2017-06-04 | Disposition: A | Discharge status: Home / Self Care | Payer: 59 | Source: Ambulatory Visit | Attending: Neurosurgery | Admitting: Neurosurgery

## 2017-06-04 ENCOUNTER — Other Ambulatory Visit (HOSPITAL_COMMUNITY): Payer: Self-pay | Admitting: Neurosurgery

## 2017-06-04 DIAGNOSIS — M50223 Other cervical disc displacement at C6-C7 level: Secondary | ICD-10-CM | POA: Insufficient documentation

## 2017-06-04 DIAGNOSIS — M542 Cervicalgia: Secondary | ICD-10-CM | POA: Diagnosis not present

## 2017-06-04 DIAGNOSIS — M4712 Other spondylosis with myelopathy, cervical region: Secondary | ICD-10-CM | POA: Insufficient documentation

## 2017-06-04 DIAGNOSIS — M50323 Other cervical disc degeneration at C6-C7 level: Secondary | ICD-10-CM | POA: Insufficient documentation

## 2017-06-04 DIAGNOSIS — M1288 Other specific arthropathies, not elsewhere classified, other specified site: Secondary | ICD-10-CM | POA: Diagnosis not present

## 2017-06-04 DIAGNOSIS — M4802 Spinal stenosis, cervical region: Secondary | ICD-10-CM | POA: Diagnosis not present

## 2017-06-07 ENCOUNTER — Telehealth: Payer: Self-pay | Admitting: Family Medicine

## 2017-06-07 DIAGNOSIS — E038 Other specified hypothyroidism: Secondary | ICD-10-CM

## 2017-06-07 DIAGNOSIS — R739 Hyperglycemia, unspecified: Secondary | ICD-10-CM

## 2017-06-07 DIAGNOSIS — E7849 Other hyperlipidemia: Secondary | ICD-10-CM

## 2017-06-07 DIAGNOSIS — E1065 Type 1 diabetes mellitus with hyperglycemia: Secondary | ICD-10-CM

## 2017-06-07 DIAGNOSIS — I10 Essential (primary) hypertension: Secondary | ICD-10-CM

## 2017-06-07 NOTE — Telephone Encounter (Signed)
Patient is requesting order for her labs, specifically thyroid.  She wants to have this done before 06/09/17.

## 2017-06-07 NOTE — Telephone Encounter (Signed)
Last labs tsh, bmp, liver, lipid. 12/04/16

## 2017-06-07 NOTE — Telephone Encounter (Signed)
Based on her medications as well as previous labs I recommend TSH, lipid, fasting glucose, A1c-hyperlipidemia, hypothyroidism, fasting hyperglycemia

## 2017-06-08 NOTE — Telephone Encounter (Signed)
Blood work ordered in EPIC. Patient notified. 

## 2017-06-10 DIAGNOSIS — E784 Other hyperlipidemia: Secondary | ICD-10-CM | POA: Diagnosis not present

## 2017-06-10 DIAGNOSIS — R739 Hyperglycemia, unspecified: Secondary | ICD-10-CM | POA: Diagnosis not present

## 2017-06-10 DIAGNOSIS — E038 Other specified hypothyroidism: Secondary | ICD-10-CM | POA: Diagnosis not present

## 2017-06-11 LAB — HEMOGLOBIN A1C
Est. average glucose Bld gHb Est-mCnc: 120 mg/dL
Hgb A1c MFr Bld: 5.8 % — ABNORMAL HIGH (ref 4.8–5.6)

## 2017-06-11 LAB — LIPID PANEL
CHOLESTEROL TOTAL: 222 mg/dL — AB (ref 100–199)
Chol/HDL Ratio: 4.2 ratio (ref 0.0–4.4)
HDL: 53 mg/dL (ref >39)
LDL Calculated: 142 mg/dL — ABNORMAL HIGH (ref 0–99)
TRIGLYCERIDES: 136 mg/dL (ref 0–149)
VLDL CHOLESTEROL CAL: 27 mg/dL (ref 5–40)

## 2017-06-11 LAB — TSH: TSH: 2.99 u[IU]/mL (ref 0.450–4.500)

## 2017-06-11 LAB — GLUCOSE, RANDOM: Glucose: 105 mg/dL — ABNORMAL HIGH (ref 65–99)

## 2017-06-16 ENCOUNTER — Telehealth: Payer: Self-pay | Admitting: Family Medicine

## 2017-06-16 DIAGNOSIS — M4712 Other spondylosis with myelopathy, cervical region: Secondary | ICD-10-CM | POA: Diagnosis not present

## 2017-06-16 DIAGNOSIS — M47816 Spondylosis without myelopathy or radiculopathy, lumbar region: Secondary | ICD-10-CM | POA: Diagnosis not present

## 2017-06-16 NOTE — Telephone Encounter (Signed)
Requesting results to her recent lab work.  She is almost out of her thyroid medication and needs a refill also.

## 2017-06-16 NOTE — Telephone Encounter (Signed)
Addendum  Will discuss thyroid, hypertension, depression then

## 2017-06-16 NOTE — Telephone Encounter (Signed)
Tell pt every single visit last ten months has been prob management, she needs ov focusing on all her choric issues next wk

## 2017-06-17 NOTE — Telephone Encounter (Signed)
Discussed with pt. Pt verbalized understanding. Pt transferred to front to schedule office visit next week.

## 2017-06-18 MED FILL — PHENTERMINE 37.5 MG TABLET: 37.5 | 30 days supply | Qty: 30 | Fill #1

## 2017-06-18 MED FILL — valACYclovir HCL 1 GM TABS: 1 | 10 days supply | Qty: 30 | Fill #1 | Status: TO

## 2017-06-28 ENCOUNTER — Ambulatory Visit (INDEPENDENT_AMBULATORY_CARE_PROVIDER_SITE_OTHER): Payer: 59 | Admitting: Family Medicine

## 2017-06-28 ENCOUNTER — Encounter: Payer: Self-pay | Admitting: Family Medicine

## 2017-06-28 VITALS — Ht 62.0 in

## 2017-06-28 DIAGNOSIS — I10 Essential (primary) hypertension: Secondary | ICD-10-CM | POA: Diagnosis not present

## 2017-06-28 DIAGNOSIS — J329 Chronic sinusitis, unspecified: Secondary | ICD-10-CM

## 2017-06-28 DIAGNOSIS — F321 Major depressive disorder, single episode, moderate: Secondary | ICD-10-CM | POA: Diagnosis not present

## 2017-06-28 DIAGNOSIS — J310 Chronic rhinitis: Secondary | ICD-10-CM

## 2017-06-28 MED ORDER — LEVOTHYROXINE SODIUM 150 MCG PO TABS: 150.0000 ug | ORAL_TABLET | Freq: Every day | ORAL | 2 refills | Status: DC

## 2017-06-28 MED ORDER — CEFDINIR 300 MG PO CAPS: 300.0000 mg | ORAL_CAPSULE | Freq: Two times a day (BID) | ORAL | 0 refills | Status: DC

## 2017-06-28 MED ORDER — LISINOPRIL 10 MG PO TABS: 10.0000 mg | ORAL_TABLET | Freq: Every day | ORAL | 1 refills | Status: DC

## 2017-06-28 MED FILL — LISINOPRIL 10 MG TABLET: 10 | 90 days supply | Qty: 90 | Fill #0 | Status: TO

## 2017-06-28 MED FILL — CEFDINIR 300 MG CAPSULE: 300 | 10 days supply | Qty: 20 | Fill #0

## 2017-06-28 MED FILL — LEVOTHYROXINE 150 MCG TAB: 150 | 90 days supply | Qty: 90 | Fill #0

## 2017-06-28 NOTE — Progress Notes (Signed)
   Subjective:    Patient ID: [name], female    DOB: Feb 28, 1956, 61 y.o.   MRN: [id number]  Hypertension  This is a chronic problem. The current episode started more than 1 year ago. Risk factors for coronary artery disease include post-menopausal state. Treatments tried: lisinopril. There are no compliance problems.    Blood pressure medicine and blood pressure levels reviewed today with patient. Compliant with blood pressure medicine. States does not miss a dose. No obvious side effects. Blood pressure generally good when checked elsewhere. Watching salt intake.  .cough and cong  dranage and stuffiness  Dim energy cough and headache  Results for orders placed or performed in visit on 06/07/17  TSH  Result Value Ref Range   TSH 2.990 0.450 - 4.500 uIU/mL  Lipid panel  Result Value Ref Range   Cholesterol, Total 222 (H) 100 - 199 mg/dL   Triglycerides 829136 0 - 149 mg/dL   HDL 53 >56>39 mg/dL   VLDL Cholesterol Cal 27 5 - 40 mg/dL   LDL Calculated 213142 (H) 0 - 99 mg/dL   Chol/HDL Ratio 4.2 0.0 - 4.4 ratio  Glucose, random  Result Value Ref Range   Glucose 105 (H) 65 - 99 mg/dL  Hemoglobin Y8MA1c  Result Value Ref Range   Hgb A1c MFr Bld 5.8 (H) 4.8 - 5.6 %   Est. average glucose Bld gHb Est-mCnc 120 mg/dL   Thyroid states compliant, does not miss a dose.  Patient notes ongoing compliance with antidepressant medication. No obvious side effects. Reports does not miss a dose. Overall continues to help depression substantially. No thoughts of homicide or suicide. Would like to maintain medication. A  Not exercising  Facing potential neck urg  Ha dcarpal tunnel sur g   Discuss recent lab work Review of Systems No headache, no major weight loss or weight gain, no chest pain no back pain abdominal pain no change in bowel habits complete ROS otherwise negative     Objective:   Physical Exam Alert and oriented, vitals reviewed and stable, NAD ENT-TM's and ext canals WNL  bilat via otoscopic exam Soft palate, tonsils and post pharynxPositive nasal discharge frontal tenderness otherwise WNL via oropharyngeal exam Neck-symmetric, no masses; thyroid nonpalpable and nontender Pulmonary-no tachypnea or accessory muscle use; Clear without wheezes via auscultation Card--no abnrml murmurs, rhythm reg and rate WNL Carotid pulses symmetric, without bruits        Assessment & Plan:  Impression 1 hypothyroidism discussed maintain same dose #2 hypertension suboptimal control discussed increase medicines and 10 mg #3 depression clinically stable discussed maintain same meds #4 rhinosinusitis

## 2017-06-28 NOTE — Patient Instructions (Signed)

## 2017-07-12 MED FILL — MELOXICAM 15 MG TABLET: 15 | 30 days supply | Qty: 30 | Fill #0

## 2017-07-19 DIAGNOSIS — F419 Anxiety disorder, unspecified: Secondary | ICD-10-CM | POA: Diagnosis not present

## 2017-07-19 DIAGNOSIS — M1990 Unspecified osteoarthritis, unspecified site: Secondary | ICD-10-CM | POA: Diagnosis not present

## 2017-07-19 DIAGNOSIS — I10 Essential (primary) hypertension: Secondary | ICD-10-CM | POA: Diagnosis not present

## 2017-07-19 DIAGNOSIS — M50223 Other cervical disc displacement at C6-C7 level: Secondary | ICD-10-CM | POA: Diagnosis not present

## 2017-07-19 DIAGNOSIS — M4712 Other spondylosis with myelopathy, cervical region: Secondary | ICD-10-CM | POA: Diagnosis not present

## 2017-07-19 DIAGNOSIS — E039 Hypothyroidism, unspecified: Secondary | ICD-10-CM | POA: Diagnosis not present

## 2017-07-19 DIAGNOSIS — Z886 Allergy status to analgesic agent status: Secondary | ICD-10-CM | POA: Diagnosis not present

## 2017-07-19 DIAGNOSIS — I700 Atherosclerosis of aorta: Secondary | ICD-10-CM | POA: Diagnosis not present

## 2017-07-19 DIAGNOSIS — Z79899 Other long term (current) drug therapy: Secondary | ICD-10-CM | POA: Diagnosis not present

## 2017-07-19 DIAGNOSIS — Z880 Allergy status to penicillin: Secondary | ICD-10-CM | POA: Diagnosis not present

## 2017-07-19 DIAGNOSIS — Z01818 Encounter for other preprocedural examination: Secondary | ICD-10-CM | POA: Diagnosis not present

## 2017-07-22 DIAGNOSIS — M50223 Other cervical disc displacement at C6-C7 level: Secondary | ICD-10-CM | POA: Diagnosis not present

## 2017-07-22 DIAGNOSIS — M5126 Other intervertebral disc displacement, lumbar region: Secondary | ICD-10-CM | POA: Diagnosis not present

## 2017-07-22 DIAGNOSIS — Z79899 Other long term (current) drug therapy: Secondary | ICD-10-CM | POA: Diagnosis not present

## 2017-07-22 DIAGNOSIS — E039 Hypothyroidism, unspecified: Secondary | ICD-10-CM | POA: Diagnosis not present

## 2017-07-22 DIAGNOSIS — F419 Anxiety disorder, unspecified: Secondary | ICD-10-CM | POA: Diagnosis not present

## 2017-07-22 DIAGNOSIS — M4322 Fusion of spine, cervical region: Secondary | ICD-10-CM | POA: Diagnosis not present

## 2017-07-22 DIAGNOSIS — M1990 Unspecified osteoarthritis, unspecified site: Secondary | ICD-10-CM | POA: Diagnosis not present

## 2017-07-22 DIAGNOSIS — Z886 Allergy status to analgesic agent status: Secondary | ICD-10-CM | POA: Diagnosis not present

## 2017-07-22 DIAGNOSIS — Z880 Allergy status to penicillin: Secondary | ICD-10-CM | POA: Diagnosis not present

## 2017-07-22 DIAGNOSIS — M4712 Other spondylosis with myelopathy, cervical region: Secondary | ICD-10-CM | POA: Diagnosis not present

## 2017-07-22 DIAGNOSIS — I10 Essential (primary) hypertension: Secondary | ICD-10-CM | POA: Diagnosis not present

## 2017-07-23 DIAGNOSIS — Z79899 Other long term (current) drug therapy: Secondary | ICD-10-CM | POA: Diagnosis not present

## 2017-07-23 DIAGNOSIS — E039 Hypothyroidism, unspecified: Secondary | ICD-10-CM | POA: Diagnosis not present

## 2017-07-23 DIAGNOSIS — Z886 Allergy status to analgesic agent status: Secondary | ICD-10-CM | POA: Diagnosis not present

## 2017-07-23 DIAGNOSIS — I10 Essential (primary) hypertension: Secondary | ICD-10-CM | POA: Diagnosis not present

## 2017-07-23 DIAGNOSIS — F419 Anxiety disorder, unspecified: Secondary | ICD-10-CM | POA: Diagnosis not present

## 2017-07-23 DIAGNOSIS — Z880 Allergy status to penicillin: Secondary | ICD-10-CM | POA: Diagnosis not present

## 2017-07-23 DIAGNOSIS — M1990 Unspecified osteoarthritis, unspecified site: Secondary | ICD-10-CM | POA: Diagnosis not present

## 2017-07-23 DIAGNOSIS — M4712 Other spondylosis with myelopathy, cervical region: Secondary | ICD-10-CM | POA: Diagnosis not present

## 2017-07-23 DIAGNOSIS — M50223 Other cervical disc displacement at C6-C7 level: Secondary | ICD-10-CM | POA: Diagnosis not present

## 2017-07-26 ENCOUNTER — Telehealth: Payer: Self-pay | Admitting: Family Medicine

## 2017-07-26 NOTE — Telephone Encounter (Signed)
Review report of Surgical Pathology from [facility].

## 2017-07-28 DIAGNOSIS — M5126 Other intervertebral disc displacement, lumbar region: Secondary | ICD-10-CM | POA: Insufficient documentation

## 2017-07-28 DIAGNOSIS — M47816 Spondylosis without myelopathy or radiculopathy, lumbar region: Secondary | ICD-10-CM | POA: Insufficient documentation

## 2017-07-28 DIAGNOSIS — M4302 Spondylolysis, cervical region: Secondary | ICD-10-CM | POA: Insufficient documentation

## 2017-07-30 DIAGNOSIS — Z9889 Other specified postprocedural states: Secondary | ICD-10-CM | POA: Diagnosis not present

## 2017-07-30 DIAGNOSIS — Z981 Arthrodesis status: Secondary | ICD-10-CM | POA: Diagnosis not present

## 2017-07-30 DIAGNOSIS — M4322 Fusion of spine, cervical region: Secondary | ICD-10-CM | POA: Diagnosis not present

## 2017-08-10 MED FILL — SERTRALINE HCL 100 MG TAB: 100 | 30 days supply | Qty: 45 | Fill #1 | Status: TO

## 2017-09-14 ENCOUNTER — Telehealth: Payer: Self-pay | Admitting: Family Medicine

## 2017-09-14 MED ORDER — LEVOTHYROXINE SODIUM 150 MCG PO TABS: 150.0000 ug | ORAL_TABLET | Freq: Every day | ORAL | 1 refills | Status: DC

## 2017-09-14 NOTE — Telephone Encounter (Signed)
Pt is needing a 90 day supply of levothyroxine (SYNTHROID, LEVOTHROID) 150 MCG tablet    [facility]

## 2017-09-28 ENCOUNTER — Telehealth: Payer: Self-pay | Admitting: Family Medicine

## 2017-09-28 NOTE — Telephone Encounter (Signed)
Patient notified and verbalized understanding. 

## 2017-09-28 NOTE — Telephone Encounter (Signed)
No, just cked in July, we can talk at the six mo visit (due around [place]) if pt wants we can do one then

## 2017-09-28 NOTE — Telephone Encounter (Signed)
Patient wants to know if she is due to have labs drawn to have her thyroid checked?

## 2017-09-28 NOTE — Telephone Encounter (Signed)
Last tsh 06/10/2017

## 2017-10-05 ENCOUNTER — Telehealth: Payer: Self-pay | Admitting: Family Medicine

## 2017-10-05 MED ORDER — SERTRALINE HCL 100 MG PO TABS: ORAL_TABLET | ORAL | 0 refills | Status: DC

## 2017-10-05 MED ORDER — LEVOTHYROXINE SODIUM 150 MCG PO TABS: 150.0000 ug | ORAL_TABLET | Freq: Every day | ORAL | 0 refills | Status: DC

## 2017-10-05 MED ORDER — LISINOPRIL 10 MG PO TABS: 10.0000 mg | ORAL_TABLET | Freq: Every day | ORAL | 0 refills | Status: DC

## 2017-10-05 NOTE — Telephone Encounter (Signed)
Patient would like all her prescription changed to [facility] with 90 day supply it would be cheaper for her.

## 2017-10-05 NOTE — Telephone Encounter (Signed)
geez ok 

## 2017-10-05 NOTE — Telephone Encounter (Signed)
Prescriptions sent electronically to pharmacy 

## 2017-10-08 ENCOUNTER — Encounter: Payer: Self-pay | Admitting: Nurse Practitioner

## 2017-10-08 ENCOUNTER — Ambulatory Visit (INDEPENDENT_AMBULATORY_CARE_PROVIDER_SITE_OTHER): Payer: Self-pay | Admitting: Nurse Practitioner

## 2017-10-08 VITALS — BP 120/82 | Temp 98.3°F | Wt 169.0 lb

## 2017-10-08 DIAGNOSIS — L239 Allergic contact dermatitis, unspecified cause: Secondary | ICD-10-CM

## 2017-10-08 MED ORDER — HYDROXYZINE HCL 25 MG PO TABS: 25.0000 mg | ORAL_TABLET | ORAL | 0 refills | Status: DC | PRN

## 2017-10-08 MED ORDER — METHYLPREDNISOLONE ACETATE 40 MG/ML IJ SUSP
40.0000 mg | Freq: Once | INTRAMUSCULAR | Status: AC
  Administered 2017-10-08: 40 mg via INTRAMUSCULAR

## 2017-10-08 MED ORDER — PREDNISONE 20 MG PO TABS: ORAL_TABLET | ORAL | 0 refills | Status: DC

## 2017-10-09 ENCOUNTER — Encounter: Payer: Self-pay | Admitting: Nurse Practitioner

## 2017-10-09 NOTE — Progress Notes (Signed)
Subjective: Presents for complaints of a rash and itching that began about a week and a half ago.  Started on her right anterior leg area.  Since then slowly spread to various areas of the body including the sides of her face over the past few days.  Very pruritic.  No known contacts.  No known environmental allergens.  Has been doing a lot of activities outdoors.  No fever.  No wheezing.  Objective:   BP 120/82   Temp 98.3 F (36.8 C) (Oral)   Wt 169 lb (76.7 kg)   BMI 30.91 kg/m  NAD.  Alert, oriented.  TMs normal limit.  Pharynx clear moist.  Neck supple with minimal adenopathy.  Lungs clear.  Heart regular rate and rhythm.  A fading patch of raised papules with some excoriation noted on the right pretibial area.  Patient has multiple patches of pink papules of various sizes.  2 linear patches noted on both forearms.  Assessment:  Allergic contact dermatitis, unspecified trigger - Plan: methylPREDNISolone acetate (DEPO-MEDROL) injection 40 mg    Plan:   Meds ordered this encounter  Medications  . predniSONE (DELTASONE) 20 MG tablet    Sig: 3 po qd x 3 d then 2 po qd x 3 d then 1 po qd x 2 d    Dispense:  17 tablet    Refill:  0    Order Specific Question:   Supervising Provider    Answer:   Merlyn AlbertLUKING, WILLIAM S [[id number]]  . hydrOXYzine (ATARAX/VISTARIL) 25 MG tablet    Sig: Take 1 tablet (25 mg total) by mouth every 4 (four) hours as needed. For itching    Dispense:  30 tablet    Refill:  0    Order Specific Question:   Supervising Provider    Answer:   Merlyn AlbertLUKING, WILLIAM S [[id number]]  . methylPREDNISolone acetate (DEPO-MEDROL) injection 40 mg   Start prednisone taper tomorrow.  Reviewed symptomatic care and warning signs.  Call back in 72 hours if no improvement, go to ED over the weekend if worse.

## 2017-10-20 DIAGNOSIS — Z029 Encounter for administrative examinations, unspecified: Secondary | ICD-10-CM

## 2017-12-02 ENCOUNTER — Telehealth: Payer: Self-pay | Admitting: Nurse Practitioner

## 2017-12-02 DIAGNOSIS — R7303 Prediabetes: Secondary | ICD-10-CM

## 2017-12-02 DIAGNOSIS — E785 Hyperlipidemia, unspecified: Secondary | ICD-10-CM

## 2017-12-02 NOTE — Telephone Encounter (Signed)
Needs lipid profile and A1C (prediabetes); other labs not due until July 2019

## 2017-12-02 NOTE — Telephone Encounter (Signed)
Patient has an appointment on 12/13/17 with [name].  She is requesting orders for labs.

## 2017-12-02 NOTE — Telephone Encounter (Signed)
Blood work ordered in Epic. Patient notified. 

## 2017-12-03 ENCOUNTER — Ambulatory Visit: Payer: Self-pay | Admitting: Nurse Practitioner

## 2017-12-04 LAB — LIPID PANEL
CHOL/HDL RATIO: 3.8 ratio (ref 0.0–4.4)
Cholesterol, Total: 190 mg/dL (ref 100–199)
HDL: 50 mg/dL (ref >39)
LDL CALC: 110 mg/dL — AB (ref 0–99)
TRIGLYCERIDES: 148 mg/dL (ref 0–149)
VLDL CHOLESTEROL CAL: 30 mg/dL (ref 5–40)

## 2017-12-04 LAB — HEMOGLOBIN A1C
Est. average glucose Bld gHb Est-mCnc: 120 mg/dL
Hgb A1c MFr Bld: 5.8 % — ABNORMAL HIGH (ref 4.8–5.6)

## 2017-12-13 ENCOUNTER — Encounter: Payer: Self-pay | Admitting: Nurse Practitioner

## 2017-12-13 ENCOUNTER — Ambulatory Visit: Payer: Self-pay | Admitting: Nurse Practitioner

## 2017-12-13 VITALS — BP 122/80 | Ht 62.0 in | Wt 173.0 lb

## 2017-12-13 DIAGNOSIS — M5442 Lumbago with sciatica, left side: Secondary | ICD-10-CM

## 2017-12-13 DIAGNOSIS — G8929 Other chronic pain: Secondary | ICD-10-CM

## 2017-12-13 DIAGNOSIS — R7303 Prediabetes: Secondary | ICD-10-CM | POA: Insufficient documentation

## 2017-12-13 DIAGNOSIS — F419 Anxiety disorder, unspecified: Secondary | ICD-10-CM

## 2017-12-13 MED ORDER — PHENTERMINE HCL 37.5 MG PO TABS: 37.5000 mg | ORAL_TABLET | Freq: Every day | ORAL | 2 refills | Status: DC

## 2017-12-13 NOTE — Patient Instructions (Addendum)
Gabapentin 100 mg  Take one in the morning Continue 2 at night  Lidocaine patches [facility]ENS [facility]: [facility]

## 2017-12-14 ENCOUNTER — Encounter: Payer: Self-pay | Admitting: Nurse Practitioner

## 2017-12-14 NOTE — Progress Notes (Signed)
Subjective: Presents for recheck on her low back pain.  Has chronic low back pain with pain going down the left buttock and the lateral left leg to above the knee.  Has had neck surgery.  Her specialist told her that her back has 2 bulging disc.  Her neck pain is much improved since surgery.  Because of her back pain has had limited activity and has gained some weight.  States the pain is there all the time.  Currently on gabapentin 200 mg at bedtime.  Does not think she could take medication 3 times a day but is willing to try twice a day.  Compliant with Zoloft but varies her dosage from 100-150 mg a day although this medication is not expensive with her discount prescription plan.  Objective:   BP 122/80   Ht 5\' 2"  (1.575 m)   Wt 173 lb (78.5 kg)   BMI 31.64 kg/m  NAD.  Alert, oriented.  Lungs clear.  Heart regular rate and rhythm.  Central obesity noted.  Tenderness noted in the lumbar paraspinal area bilaterally.  SLR negative on the right, positive on the left.  Reflexes 5+ lower extremities.  Gait slow but steady.  Assessment:   Problem List Items Addressed This Visit      Other   Anxiety   Chronic low back pain - Primary   Morbid obesity (HCC)   Relevant Medications   phentermine (ADIPEX-P) 37.5 MG tablet       Plan:   Meds ordered this encounter  Medications  . phentermine (ADIPEX-P) 37.5 MG tablet    Sig: Take 1 tablet (37.5 mg total) by mouth daily before breakfast.    Dispense:  30 tablet    Refill:  2    Order Specific Question:   Supervising Provider    Answer:   Riccardo DubinLUKING, WILLIAM S [[id number]]   Encourage patient to take Zoloft 150 mg daily and not vary her dose.  She is uninsured at this time so our goal is to control her pain is much as possible until she can get insurance or goes on Medicare.  Want to avoid long-term opiate use.  Gabapentin 100 mg  Take one in the morning Continue 2 at night Lidocaine patches OTC TENS [facility]: [facility]  Our goal is to slowly  titrate her gabapentin dose until she gets her pain under better control. Encouraged activity as tolerated.  Regular walking program.  Daily vitamin D and calcium supplementation. Return in about 3 months (around 03/13/2018) for recheck.

## 2017-12-29 ENCOUNTER — Ambulatory Visit: Payer: 59 | Admitting: Family Medicine

## 2018-01-11 ENCOUNTER — Other Ambulatory Visit: Payer: Self-pay | Admitting: Nurse Practitioner

## 2018-01-11 ENCOUNTER — Telehealth: Payer: Self-pay | Admitting: Nurse Practitioner

## 2018-01-11 MED ORDER — GABAPENTIN 300 MG PO CAPS: ORAL_CAPSULE | ORAL | 2 refills | Status: DC

## 2018-01-11 NOTE — Telephone Encounter (Signed)
Patient notified and verbalized understanding. 

## 2018-01-11 NOTE — Telephone Encounter (Signed)
Patient said that Eber JonesCarolyn told her to call in once she was almost out of her Gabapentin and she would increase the dosage for her.  She has about 2 days left of the medication.  Please advise.  [facility]

## 2018-01-11 NOTE — Telephone Encounter (Signed)
Let patient know I increased her dose and sent in refills. Let us know if any problems with increased dose. Thanks.

## 2018-02-07 ENCOUNTER — Telehealth: Payer: Self-pay | Admitting: Nurse Practitioner

## 2018-02-07 NOTE — Telephone Encounter (Signed)
Eber JonesCarolyn recently increased her gabapentin (NEURONTIN) 300 mg to twice a day and it is making her too tired.  What should she do?

## 2018-02-07 NOTE — Telephone Encounter (Signed)
Two choices: if it is helping her back she may want to stay on this dose but take both at bedtime together OR we will need to decrease the dose.

## 2018-02-08 NOTE — Telephone Encounter (Signed)
Pt wants to reduce to 100 mg BID. Please advise.

## 2018-02-08 NOTE — Telephone Encounter (Signed)
Left message to return call 

## 2018-02-09 ENCOUNTER — Other Ambulatory Visit: Payer: Self-pay | Admitting: Nurse Practitioner

## 2018-02-09 MED ORDER — GABAPENTIN 100 MG PO CAPS: 100.0000 mg | ORAL_CAPSULE | Freq: Two times a day (BID) | ORAL | 5 refills | Status: DC

## 2018-02-09 NOTE — Telephone Encounter (Signed)
Patient notified

## 2018-02-09 NOTE — Telephone Encounter (Signed)
Done

## 2018-03-18 ENCOUNTER — Telehealth: Payer: Self-pay | Admitting: Family Medicine

## 2018-03-18 DIAGNOSIS — E039 Hypothyroidism, unspecified: Secondary | ICD-10-CM

## 2018-03-18 NOTE — Telephone Encounter (Signed)
Pt is requesting lab orders for her thyroid. Please advise.

## 2018-03-19 NOTE — Telephone Encounter (Signed)
Please order TSH. Thanks  

## 2018-03-21 NOTE — Telephone Encounter (Signed)
Orders put in. Pt notified.  

## 2018-03-29 LAB — TSH: TSH: 0.337 u[IU]/mL — ABNORMAL LOW (ref 0.450–4.500)

## 2018-03-31 ENCOUNTER — Other Ambulatory Visit: Payer: Self-pay | Admitting: Nurse Practitioner

## 2018-03-31 ENCOUNTER — Other Ambulatory Visit: Payer: Self-pay | Admitting: Family Medicine

## 2018-04-19 ENCOUNTER — Ambulatory Visit: Payer: Self-pay | Admitting: Family Medicine

## 2018-04-19 ENCOUNTER — Encounter: Payer: Self-pay | Admitting: Family Medicine

## 2018-04-19 ENCOUNTER — Other Ambulatory Visit: Payer: Self-pay | Admitting: Family Medicine

## 2018-04-19 VITALS — BP 146/76 | Temp 98.1°F | Ht 62.0 in | Wt 170.0 lb

## 2018-04-19 DIAGNOSIS — M542 Cervicalgia: Secondary | ICD-10-CM

## 2018-04-19 DIAGNOSIS — J329 Chronic sinusitis, unspecified: Secondary | ICD-10-CM

## 2018-04-19 DIAGNOSIS — S161XXA Strain of muscle, fascia and tendon at neck level, initial encounter: Secondary | ICD-10-CM

## 2018-04-19 DIAGNOSIS — J310 Chronic rhinitis: Secondary | ICD-10-CM

## 2018-04-19 MED ORDER — AZITHROMYCIN 250 MG PO TABS: ORAL_TABLET | ORAL | 0 refills | Status: DC

## 2018-04-19 NOTE — Progress Notes (Signed)
   Subjective:    Patient ID: [name], female    DOB: Jul 13, 1956, 62 y.o.   MRN: [id number]  HPI Patient is here today with complaints of nasal congestion,body aches,cough,runny nose,headache,sore throat for two weeks now,started ten d ago, week and a half ago, sneeing and coughing and feeling bad and terrible  Pos congestion and gunkines   Pos pressure both eyes, sore and tnder  Non prod cough   Pos some discarge   also neck pain post surgery from a year ago after moving a fire pit two weeks ago.Taking advil which is helps some.  Pt moved a fire pit and had some substantial pain in the right post neck  Taking advil   Takes advil three times per day, two of them   Review of Systems No headache, no major weight loss or weight gain, no chest pain no back pain abdominal pain no change in bowel habits complete ROS otherwise negative     Objective:   Physical Exam   Alert, mild malaise. Hydration good Vitals stable. frontal/ maxillary tenderness evident positive nasal congestion. pharynx normal neck supple  lungs clear/no crackles or wheezes. heart regular in rhythm Right posterior lateral neck tenderness to deep palpation.     Assessment & Plan:  Impression rhinosinusitis likely post viral, discussed with patient. plan antibiotics prescribed. Questions answered. Symptomatic care discussed. warning signs discussed. WSL /Posterior neck strain/symptom care discussed

## 2018-06-17 ENCOUNTER — Encounter: Payer: Self-pay | Admitting: Family Medicine

## 2018-06-17 ENCOUNTER — Ambulatory Visit (INDEPENDENT_AMBULATORY_CARE_PROVIDER_SITE_OTHER): Payer: Self-pay | Admitting: Family Medicine

## 2018-06-17 VITALS — BP 124/76 | Temp 98.4°F | Ht 62.0 in | Wt 170.0 lb

## 2018-06-17 DIAGNOSIS — M545 Low back pain, unspecified: Secondary | ICD-10-CM

## 2018-06-17 MED ORDER — HYDROCODONE-ACETAMINOPHEN 5-325 MG PO TABS: ORAL_TABLET | ORAL | 0 refills | Status: DC

## 2018-06-17 MED ORDER — CHLORZOXAZONE 500 MG PO TABS: ORAL_TABLET | ORAL | 0 refills | Status: DC

## 2018-06-17 MED ORDER — PREDNISONE 20 MG PO TABS: ORAL_TABLET | ORAL | 0 refills | Status: DC

## 2018-06-17 NOTE — Progress Notes (Signed)
   Subjective:    Patient ID: Audrey DoughtyCathy H [name], female    DOB: 1956/07/02, 62 y.o.   MRN: [id number]  Sinusitis  This is a new problem. Episode onset: 2  days. Associated symptoms include coughing and headaches. (Abdominal pain)   Back pain. Tried gabapentin. Pt was trying to get up on float , and felt some major discomfort  Pt took three gabapentin and it did not  Worse on right side, rad upward Test worse with twisting or movement  Some hip pain  Catches and hrts , some pain  Breathing does not affect   Requesting refill on phentermine    Review of Systems  Respiratory: Positive for cough.   Neurological: Positive for headaches.       Objective:   Physical Exam  Alert vitals stable, NAD. Blood pressure good on repeat. HEENT normal. Lungs clear. Heart regular rate and rhythm. Right paralumbar tenderness.  No spinal tenderness.  No CVA tenderness.  Negative straight leg raise      Assessment & Plan:  Impression lumbar strain discussed add prednisone taper.  Chlorzoxazone as needed.  And hydrocodone.  For more severe pain.  Regular low back exercises discussed and encouraged

## 2018-07-06 ENCOUNTER — Telehealth: Payer: Self-pay | Admitting: *Deleted

## 2018-07-06 NOTE — Telephone Encounter (Signed)
[place] gave a shot of antibiotics, steroid shot and a zpack. Pt states her breathing is no better. No available appts today. Advised pt to go to urgent care or ED. Pt does not have insurance states she will go to urgent care. Number to [place] given.

## 2018-07-08 ENCOUNTER — Encounter: Payer: Self-pay | Admitting: Family Medicine

## 2018-07-08 ENCOUNTER — Ambulatory Visit (INDEPENDENT_AMBULATORY_CARE_PROVIDER_SITE_OTHER): Payer: Self-pay | Admitting: Family Medicine

## 2018-07-08 VITALS — BP 124/84 | HR 80 | Temp 98.0°F | Ht 62.0 in | Wt 168.0 lb

## 2018-07-08 DIAGNOSIS — J4541 Moderate persistent asthma with (acute) exacerbation: Secondary | ICD-10-CM

## 2018-07-08 MED ORDER — ALBUTEROL SULFATE HFA 108 (90 BASE) MCG/ACT IN AERS
2.0000 | INHALATION_SPRAY | Freq: Four times a day (QID) | RESPIRATORY_TRACT | 2 refills | Status: DC | PRN
Start: 2018-07-08 — End: 2019-10-16

## 2018-07-08 MED ORDER — PREDNISONE 20 MG PO TABS: ORAL_TABLET | ORAL | 0 refills | Status: DC

## 2018-07-08 MED ORDER — DOXYCYCLINE HYCLATE 100 MG PO CAPS: 100.0000 mg | ORAL_CAPSULE | Freq: Two times a day (BID) | ORAL | 0 refills | Status: DC

## 2018-07-08 MED ORDER — METHYLPREDNISOLONE ACETATE 40 MG/ML IJ SUSP
40.0000 mg | Freq: Once | INTRAMUSCULAR | Status: AC
  Administered 2018-07-08: 40 mg via INTRAMUSCULAR

## 2018-07-08 NOTE — Progress Notes (Signed)
   Subjective:    Patient ID: Audrey DoughtyCathy H Mata, female    DOB: 10/28/1956, 62 y.o.   MRN: [id number]  HPI  Patient is here today due to having some shortness of breath.She has a cough,wheezing,runny nose,headache,sore throat, ear pain,fever ongoing for the last two weeks. [place] ([place]when it started and she went to an urgent care while there. They gave her 1 gram rocephin shot,dexamethasone 4 mg shot and a zpak. She states the cough is some what better she is now able to cough some phlegm up and it is light green in color, some times its yellow.She has tried mucinex "A whole bottle", still not coming up . She says she has tried everything she knows to try.  This patient smoked in the past no longer smokes she relates some coughing wheezing shortness of breath denies high fever chills sweats she is having wheezing and denies difficulty breathing but does get short of breath with minimal activity patient has significant bronchial wheezes not respiratory distress O2 sat good PMH benign Review of Systems  Constitutional: Negative for activity change and appetite change.  HENT: Negative for congestion and rhinorrhea.   Respiratory: Positive for shortness of breath and wheezing. Negative for cough.   Cardiovascular: Positive for chest pain. Negative for leg swelling.  Gastrointestinal: Negative for abdominal pain, nausea and vomiting.  Skin: Negative for color change.  Neurological: Negative for dizziness and weakness.  Psychiatric/Behavioral: Negative for agitation and confusion.       Objective:   Physical Exam  Constitutional: She appears well-developed.  HENT:  Head: Normocephalic.  Nose: Nose normal.  Mouth/Throat: Oropharynx is clear and moist. No oropharyngeal exudate.  Neck: Neck supple.  Cardiovascular: Normal rate and normal heart sounds.  No murmur heard. Pulmonary/Chest: Effort normal. No apnea. She has wheezes.  Lymphadenopathy:    She  has no cervical adenopathy.  Skin: Skin is warm and dry.  Nursing note and vitals reviewed.   Does not appear toxic  Not respiratory distress O2 sat normal    Assessment & Plan:  Bronchial involvement we will cover with doxycycline as well  Asthma flareup Needs work-up for COPD Patient cannot afford a lot of testing Pre-and post PFT Depo-Medrol shot Prednisone taper Albuterol on a regular basis Antibiotics prescribed Follow-up if progressive troubles or worse Go to ER if worse

## 2018-07-11 ENCOUNTER — Other Ambulatory Visit: Payer: Self-pay | Admitting: Family Medicine

## 2018-09-02 ENCOUNTER — Other Ambulatory Visit: Payer: Self-pay | Admitting: Family Medicine

## 2018-09-02 ENCOUNTER — Telehealth: Payer: Self-pay | Admitting: Family Medicine

## 2018-09-02 MED ORDER — VALACYCLOVIR HCL 1 G PO TABS: ORAL_TABLET | ORAL | 5 refills | Status: DC

## 2018-09-02 NOTE — Telephone Encounter (Signed)
For a fever blister The treatment is valacyclovir 1000 mg 2 pills now then 2 pills 12 hours later, #4 may have 5 refills for any future recurrences

## 2018-09-02 NOTE — Telephone Encounter (Signed)
Please advise 

## 2018-09-02 NOTE — Telephone Encounter (Signed)
Medication sent in and pt is aware  

## 2018-09-02 NOTE — Telephone Encounter (Signed)
Pt has a very bad fever blister in the corner of her mouth and would like valACYclovir (VALTREX) 1000 MG tablet called in to [facility] - [place], [place] - [address].

## 2018-09-07 NOTE — Telephone Encounter (Signed)
Patient notified per Dr Lorin Picket this is the new protocol for fever blister treatment. Patient verbalized understanding.

## 2018-09-07 NOTE — Telephone Encounter (Signed)
Pt calling in regards of med valACYclovir (VALTREX) 1000 MG tablet she's requesting dispense to be more than 4 tablets per refill. Please advise and inform pt with further instructions.

## 2018-09-19 ENCOUNTER — Other Ambulatory Visit: Payer: Self-pay

## 2018-09-19 ENCOUNTER — Other Ambulatory Visit: Payer: Self-pay | Admitting: Family Medicine

## 2018-09-19 ENCOUNTER — Telehealth: Payer: Self-pay | Admitting: Family Medicine

## 2018-09-19 DIAGNOSIS — E038 Other specified hypothyroidism: Secondary | ICD-10-CM

## 2018-09-19 NOTE — Telephone Encounter (Signed)
tsh

## 2018-09-19 NOTE — Telephone Encounter (Signed)
Patient would like to get a thyroid check, pt has no further appt scheduled, pt states she will make 6 month follow appt with Dr.Steve when test is resulted.   Last 6 month follow up with Dr.Steve was canceled 12/29/17  Seen for thyroid issues on 12/13/17 with Eber Jones.   Advise.

## 2018-09-19 NOTE — Telephone Encounter (Signed)
Left message to return call 

## 2018-09-19 NOTE — Telephone Encounter (Signed)
Pt notified. Order was put in.

## 2018-09-20 ENCOUNTER — Telehealth: Payer: Self-pay | Admitting: Family Medicine

## 2018-09-20 LAB — TSH: TSH: 1.33 u[IU]/mL (ref 0.450–4.500)

## 2018-09-20 NOTE — Telephone Encounter (Signed)
Patient would like results of her labs.

## 2018-09-20 NOTE — Telephone Encounter (Signed)
Spoke with the pt and she is aware of all. 

## 2018-10-11 ENCOUNTER — Other Ambulatory Visit: Payer: Self-pay | Admitting: Family Medicine

## 2018-10-12 ENCOUNTER — Encounter: Payer: Self-pay | Admitting: Family Medicine

## 2018-10-12 ENCOUNTER — Ambulatory Visit (INDEPENDENT_AMBULATORY_CARE_PROVIDER_SITE_OTHER): Payer: Self-pay | Admitting: Family Medicine

## 2018-10-12 VITALS — Temp 98.4°F | Ht 62.0 in | Wt 173.0 lb

## 2018-10-12 DIAGNOSIS — J4541 Moderate persistent asthma with (acute) exacerbation: Secondary | ICD-10-CM

## 2018-10-12 MED ORDER — PHENTERMINE HCL 37.5 MG PO TABS: 37.5000 mg | ORAL_TABLET | Freq: Every day | ORAL | 2 refills | Status: DC

## 2018-10-12 MED ORDER — AZITHROMYCIN 250 MG PO TABS: ORAL_TABLET | ORAL | 0 refills | Status: DC

## 2018-10-12 NOTE — Telephone Encounter (Signed)
Pt contacted and advised that she needs OV. Pt verbalized understanding. Pt was transferred up front to get same day appt due to congestion and headache. Informed patient that Phentermine may or may not be discussed at the same day visit. Pt verbalized understanding.

## 2018-10-12 NOTE — Progress Notes (Signed)
   Subjective:    Patient ID: [name], female    DOB: 1956/06/21, 63 y.o.   MRN: [id number]  HPI Pt here today for congestion. Pt has been congested for about a week.    Pt took a fall the night before halloween, fell and took  A shoulder xray   Was told she was "plum full of arthriytis"  mon has hx of oxteoporosis  Take sfour ibuprofe   Pt also states she has questions that have been boiling for a while that she would like to ask PCP.  Patient expresses frustration that we did not diagnose her arthritis.    Review of Systems No headache, no major weight loss or weight gain, no chest pain no back pain abdominal pain no change in bowel habits complete ROS otherwise negative     Objective:   Physical Exam  Alert, mild malaise. Hydration good Vitals stable. frontal/ maxillary tenderness evident positive nasal congestion. pharynx normal neck supple  lungs clear/no crackles or wheezes. heart regular in rhythm       Assessment & Plan:  Impression rhinosinusitis likely post viral, discussed with patient. plan antibiotics prescribed. Questions answered. Symptomatic care discussed. warning signs discussed. WSL Medications for hypertension also refilled.  Patient in a fairly substantial way expressed frustration that we have not diagnosed the fact that her spine was "pumped full of arthritis" and x-ray was done elsewhere.  And she was told this.  Antibiotics prescribed.  Blood pressure agents maintain

## 2018-10-12 NOTE — Telephone Encounter (Signed)
Ok thre e mo worth 

## 2018-10-12 NOTE — Telephone Encounter (Signed)
No will need o v for this

## 2018-12-19 ENCOUNTER — Ambulatory Visit: Payer: Self-pay | Admitting: Family Medicine

## 2019-01-20 ENCOUNTER — Other Ambulatory Visit: Payer: Self-pay | Admitting: Family Medicine

## 2019-01-21 NOTE — Telephone Encounter (Signed)
Full refills each needs follow-up office visit by April/May

## 2019-03-20 ENCOUNTER — Telehealth: Payer: Self-pay | Admitting: Family Medicine

## 2019-03-20 NOTE — Telephone Encounter (Signed)
Call pt and explain to her wh have two different ways she should take it for two different cdtns, then srite for thirty and write use as directed

## 2019-03-20 NOTE — Telephone Encounter (Signed)
Which directions do you want to use?

## 2019-03-20 NOTE — Telephone Encounter (Signed)
On her left I think means on her "lip"  rx thirty with five ref

## 2019-03-20 NOTE — Telephone Encounter (Signed)
Left message to return call 

## 2019-03-20 NOTE — Telephone Encounter (Signed)
Patient is requesting refill on valtrex 1000 mg for fever blisters on lips and right side of eye.  [facility] pharmacy

## 2019-03-20 NOTE — Telephone Encounter (Signed)
Note copied from problem list under fever blister  When the patient gets one on her left I recommend Valtrex 1000 mg take 2 in the morning 2 in the evening, when she gets a blister outbreak on the side of her temple region I recommend Valtrex 1000 mg 13 times daily for 7 days

## 2019-03-21 ENCOUNTER — Other Ambulatory Visit: Payer: Self-pay | Admitting: *Deleted

## 2019-03-21 MED ORDER — VALACYCLOVIR HCL 1 G PO TABS: ORAL_TABLET | ORAL | 11 refills | Status: DC

## 2019-03-21 NOTE — Telephone Encounter (Signed)
Patient was taking 2 qam and 2 qpm for one day if have fever blister but not really working- as good as used to and she has a fever blister on lip and also has a few places on side of face

## 2019-03-21 NOTE — Telephone Encounter (Signed)
Ok write for 1 g , numb 21 one tid for 7 days for ALL indications, refill prn

## 2019-03-21 NOTE — Telephone Encounter (Signed)
Med sent to pharm. Pt notified.  

## 2019-03-24 ENCOUNTER — Ambulatory Visit (INDEPENDENT_AMBULATORY_CARE_PROVIDER_SITE_OTHER): Payer: Self-pay | Admitting: Family Medicine

## 2019-03-24 ENCOUNTER — Other Ambulatory Visit: Payer: Self-pay

## 2019-03-24 DIAGNOSIS — N3000 Acute cystitis without hematuria: Secondary | ICD-10-CM

## 2019-03-24 MED ORDER — CIPROFLOXACIN HCL 500 MG PO TABS: 500.0000 mg | ORAL_TABLET | Freq: Two times a day (BID) | ORAL | 0 refills | Status: DC

## 2019-03-24 MED ORDER — PHENAZOPYRIDINE HCL 100 MG PO TABS: 100.0000 mg | ORAL_TABLET | Freq: Three times a day (TID) | ORAL | 0 refills | Status: DC | PRN

## 2019-03-24 NOTE — Progress Notes (Signed)
   Subjective:    Patient ID: [name], female    DOB: 1956-05-16, 63 y.o.   MRN: [id number]  Urinary Tract Infection   This is a new problem. Episode onset: 2 -3 days. There has been no fever. Associated symptoms include frequency, hesitancy and urgency. She has tried nothing for the symptoms.   Virtual Visit via Video Note  I connected with [name] on 03/24/19 at  3:30 PM EDT by a video enabled telemedicine application and verified that I am speaking with the correct person using two identifiers.   I discussed the limitations of evaluation and management by telemedicine and the availability of in person appointments. The patient expressed understanding and agreed to proceed.  History of Present Illness:    Observations/Objective:   Assessment and Plan:   Follow Up Instructions:    I discussed the assessment and treatment plan with the patient. The patient was provided an opportunity to ask questions and all were answered. The patient agreed with the plan and demonstrated an understanding of the instructions.   The patient was advised to call back or seek an in-person evaluation if the symptoms worsen or if the condition fails to improve as anticipated.  I provided of non-face-to-face time during this encounter.      Review of Systems  Genitourinary: Positive for frequency, hesitancy and urgency.       Objective:   Physical Exam        Assessment & Plan:  UTI Antibiotic sent in If high fever severe pain or worse follow-up Call if any other issues

## 2019-04-07 ENCOUNTER — Other Ambulatory Visit: Payer: Self-pay

## 2019-04-07 ENCOUNTER — Ambulatory Visit (INDEPENDENT_AMBULATORY_CARE_PROVIDER_SITE_OTHER): Payer: Self-pay | Admitting: Family Medicine

## 2019-04-07 DIAGNOSIS — E038 Other specified hypothyroidism: Secondary | ICD-10-CM

## 2019-04-07 DIAGNOSIS — E7849 Other hyperlipidemia: Secondary | ICD-10-CM

## 2019-04-07 DIAGNOSIS — R7303 Prediabetes: Secondary | ICD-10-CM

## 2019-04-07 DIAGNOSIS — R5383 Other fatigue: Secondary | ICD-10-CM

## 2019-04-07 DIAGNOSIS — I10 Essential (primary) hypertension: Secondary | ICD-10-CM

## 2019-04-07 NOTE — Progress Notes (Signed)
   Subjective:    Patient ID: [name], female    DOB: January 01, 1956, 63 y.o.   MRN: [id number] Format - video  Patient present at home Provider present at office Consent for interaction obtained Coronavirus outbreak made virtual visit necessary  HPIfatigue for 2 -3 weeks ago. Sob with activity for the past 2 -3 weeks, sleeps a lot. No fever. 2 weeks ago had a UTI. Has not felt good since. Still having some pressure. Pt wonders if her thyroid is off.   Virtual Visit via Video Note  I connected with [name] on 04/07/19 at 11:00 AM EDT by a video enabled telemedicine application and verified that I am speaking with the correct person using two identifiers.     I discussed the limitations of evaluation and management by telemedicine and the availability of in person appointments. The patient expressed understanding and agreed to proceed.  History of Present Illness:    Observations/Objective:   Assessment and Plan:   Follow Up Instructions:    I discussed the assessment and treatment plan with the patient. The patient was provided an opportunity to ask questions and all were answered. The patient agreed with the plan and demonstrated an understanding of the instructions.   The patient was advised to call back or seek an in-person evaluation if the symptoms worsen or if the condition fails to improve as anticipated.  I provided of non-face-to-face time during this encounter.     Patient strongly feels her fatigue is not related to her history of mental health issues.  Concerned about her thyroid considerably.  Not exercising much these days.  Sleep not the past.  Patient also notes progressive concerns with shortness of breath.  Some wheezing at times.  In the past steroid inhalers did not work.  Does not want to try them again.  Review of Systems No headache, no major weight loss or weight gain, no chest pain no back pain abdominal pain no change in bowel  habits complete ROS otherwise negative     Objective:   Physical Exam  Virtual visit      Assessment & Plan:  Impression fatigue.  No recent blood work.  Known history of hypothyroidism.  Need to do some screening blood test.  Also worsening shortness of breath.  If persists patient willing to see a pulmonary doctor but not right now

## 2019-04-11 LAB — CBC WITH DIFFERENTIAL/PLATELET
Basophils Absolute: 0.1 10*3/uL (ref 0.0–0.2)
Basos: 2 %
EOS (ABSOLUTE): 0.3 10*3/uL (ref 0.0–0.4)
Eos: 5 %
Hematocrit: 39.7 % (ref 34.0–46.6)
Hemoglobin: 13.6 g/dL (ref 11.1–15.9)
Immature Grans (Abs): 0 10*3/uL (ref 0.0–0.1)
Immature Granulocytes: 0 %
Lymphocytes Absolute: 2.4 10*3/uL (ref 0.7–3.1)
Lymphs: 41 %
MCH: 32.4 pg (ref 26.6–33.0)
MCHC: 34.3 g/dL (ref 31.5–35.7)
MCV: 95 fL (ref 79–97)
Monocytes Absolute: 0.6 10*3/uL (ref 0.1–0.9)
Monocytes: 10 %
Neutrophils Absolute: 2.5 10*3/uL (ref 1.4–7.0)
Neutrophils: 42 %
Platelets: 247 10*3/uL (ref 150–450)
RBC: 4.2 x10E6/uL (ref 3.77–5.28)
RDW: 13.3 % (ref 11.7–15.4)
WBC: 6 10*3/uL (ref 3.4–10.8)

## 2019-04-11 LAB — BASIC METABOLIC PANEL
BUN/Creatinine Ratio: 15 (ref 12–28)
BUN: 14 mg/dL (ref 8–27)
CO2: 22 mmol/L (ref 20–29)
Calcium: 9.4 mg/dL (ref 8.7–10.3)
Chloride: 104 mmol/L (ref 96–106)
Creatinine, Ser: 0.95 mg/dL (ref 0.57–1.00)
GFR calc Af Amer: 74 mL/min/{1.73_m2} (ref >59)
GFR calc non Af Amer: 64 mL/min/{1.73_m2} (ref >59)
Glucose: 101 mg/dL — ABNORMAL HIGH (ref 65–99)
Potassium: 4.7 mmol/L (ref 3.5–5.2)
Sodium: 139 mmol/L (ref 134–144)

## 2019-04-11 LAB — LIPID PANEL
Chol/HDL Ratio: 3.7 ratio (ref 0.0–4.4)
Cholesterol, Total: 213 mg/dL — ABNORMAL HIGH (ref 100–199)
HDL: 58 mg/dL (ref >39)
LDL Calculated: 136 mg/dL — ABNORMAL HIGH (ref 0–99)
Triglycerides: 96 mg/dL (ref 0–149)
VLDL Cholesterol Cal: 19 mg/dL (ref 5–40)

## 2019-04-11 LAB — HEPATIC FUNCTION PANEL
ALT: 26 IU/L (ref 0–32)
AST: 20 IU/L (ref 0–40)
Albumin: 4.3 g/dL (ref 3.8–4.8)
Alkaline Phosphatase: 63 IU/L (ref 39–117)
Bilirubin Total: <0.2 mg/dL (ref 0.0–1.2)
Bilirubin, Direct: 0.06 mg/dL (ref 0.00–0.40)
Total Protein: 7 g/dL (ref 6.0–8.5)

## 2019-04-11 LAB — HEMOGLOBIN A1C
Est. average glucose Bld gHb Est-mCnc: 114 mg/dL
Hgb A1c MFr Bld: 5.6 % (ref 4.8–5.6)

## 2019-04-11 LAB — TSH: TSH: 5.43 u[IU]/mL — ABNORMAL HIGH (ref 0.450–4.500)

## 2019-04-11 MED ORDER — LEVOTHYROXINE SODIUM 175 MCG PO TABS: ORAL_TABLET | ORAL | 1 refills | Status: DC

## 2019-04-11 NOTE — Addendum Note (Signed)
Addended by: Marlowe Shores on: 04/11/2019 02:52 PM   Modules accepted: Orders

## 2019-04-17 ENCOUNTER — Other Ambulatory Visit: Payer: Self-pay | Admitting: Family Medicine

## 2019-04-17 NOTE — Telephone Encounter (Signed)
Ok plus 2 ref 

## 2019-04-19 ENCOUNTER — Other Ambulatory Visit: Payer: Self-pay | Admitting: Family Medicine

## 2019-05-03 ENCOUNTER — Telehealth: Payer: Self-pay | Admitting: Family Medicine

## 2019-05-03 NOTE — Telephone Encounter (Signed)
Patient aware where to find the result on my chart.

## 2019-05-03 NOTE — Telephone Encounter (Signed)
Patient was looking at her lab results from 5/4 on mychart and didn't see her A1C results on their. Please advise.

## 2019-05-24 ENCOUNTER — Telehealth: Payer: Self-pay | Admitting: Family Medicine

## 2019-05-24 NOTE — Telephone Encounter (Signed)
Left message to return call 

## 2019-05-24 NOTE — Telephone Encounter (Signed)
Pt is having some bacterial over growth who she normally sees Dr. Laural Golden for but is unable to get in touch with his office. She states Dr. Laural Golden normally puts her on antibiotics for like 30 days. She would like to know if she should keep trying to get in touch with Dr. Laural Golden or what can be done.

## 2019-05-25 NOTE — Telephone Encounter (Signed)
Left message to return call 

## 2019-05-25 NOTE — Telephone Encounter (Signed)
ok 

## 2019-05-25 NOTE — Telephone Encounter (Signed)
Pt states she has a bacterial overgrowth. It has been about 5 years since she had. She got sick back in December and she thinks it progressed from that. Stools are black. Started about 3 weeks ago. States it is getting better. Pain on both lower sides. Cramping pain. States dr Laural Golden put her on antibiotics when this happened in the past. She states it was cipro. No fever. Having some dizziness and fatigue. Pt is out of town. Pt advised she should be seen today at urgent care or ED. Pt states she will go to urgent care.

## 2019-06-12 ENCOUNTER — Telehealth: Payer: Self-pay | Admitting: Family Medicine

## 2019-06-12 DIAGNOSIS — R109 Unspecified abdominal pain: Secondary | ICD-10-CM

## 2019-06-12 NOTE — Telephone Encounter (Signed)
Pt wants referral for bacterial overgrowth. She states everytime she eats she has abdominal pain and swelling and has to go to bathroom immediately. Same symptoms she had about 10 years ago and saw dr Laural Golden and got antibiotics and cleared up. Trying to eat 6 smalls meals a day. Temp of 99.4. symptoms have been going on since last month. She called with same symptoms and [place] and was told to go to urgent care or ED but she did not. She left message today for a referral but I was not sure if she should have virtual appt here first since it  May be a few weeks before getting in with dr Laural Golden or is it ok to just do referral. Pt advised if symptoms got worse to go to ED. She verbalized understanding

## 2019-06-12 NOTE — Telephone Encounter (Signed)
Let's do 

## 2019-06-12 NOTE — Telephone Encounter (Signed)
Please advise. Thank you

## 2019-06-12 NOTE — Telephone Encounter (Signed)
Referral put in and left message for pt to return call to let her know go to ED if worse

## 2019-06-12 NOTE — Telephone Encounter (Signed)
Patient came by at 4:15 needing a referral to Dr. Laural Golden. She stated just left and they told her she needed a referral from our office.Please advise

## 2019-06-12 NOTE — Telephone Encounter (Signed)
We will honor her request and make a ref to dr Laural Golden, if she worsens needs to go to er for abdominal work up

## 2019-06-13 ENCOUNTER — Ambulatory Visit (INDEPENDENT_AMBULATORY_CARE_PROVIDER_SITE_OTHER): Payer: Self-pay | Admitting: Family Medicine

## 2019-06-13 ENCOUNTER — Other Ambulatory Visit: Payer: Self-pay

## 2019-06-13 DIAGNOSIS — R109 Unspecified abdominal pain: Secondary | ICD-10-CM

## 2019-06-13 MED ORDER — CIPROFLOXACIN HCL 500 MG PO TABS: 500.0000 mg | ORAL_TABLET | Freq: Two times a day (BID) | ORAL | 0 refills | Status: DC

## 2019-06-13 NOTE — Telephone Encounter (Signed)
Pt has set up virtual appt for today to speak with Dr.Steve. Dr Laural Golden can not get her in for at least 2 months

## 2019-06-13 NOTE — Progress Notes (Signed)
   Subjective:  Audio plus video  Patient ID: [name], female    DOB: 06/14/1956, 63 y.o.   MRN: [id number]  HPI  Patient calls to discuss abdominal pain off and on since the first of June. Patient states she tried to see GI but cant get in for 2 months. Patient stats she has a history of bacterial overgrowth in the past and would get antibiotic from GI  to clear it up.  Virtual Visit via Video Note  I connected with [name] on 06/13/19 at  3:50 PM EDT by a video enabled telemedicine application and verified that I am speaking with the correct person using two identifiers.  Location: Patient: home Provider: office   I discussed the limitations of evaluation and management by telemedicine and the availability of in person appointments. The patient expressed understanding and agreed to proceed.  History of Present Illness:    Observations/Objective:   Assessment and Plan:   Follow Up Instructions:    I discussed the assessment and treatment plan with the patient. The patient was provided an opportunity to ask questions and all were answered. The patient agreed with the plan and demonstrated an understanding of the instructions.   The patient was advised to call back or seek an in-person evaluation if the symptoms worsen or if the condition fails to improve as anticipated.  I provided  20 minutes of non-face-to-face time during this encounter.      Review of Systems No headache, no major weight loss or weight gain, no chest pain no back pain abdominal pain no change in bowel habits complete ROS otherwise negative     Objective:   Physical Exam Virtual       Assessment & Plan:  Impression intermittent abdominal pain.  Worsening.  Occasional changes in stools.  Patient states this is identical to a bacterial overgrowth syndrome she had nearly 10 years ago.  She went directly to her GI [name].  Their office insisted on a referral.  We did the referral.   Now they will not see her for 2 months.  Patient currently not in any significant distress.  She would like a 30-day round of antibiotics like [name] gave her last time helped her very much.  I have told her this is not ideal.  She refused a recommendation last week to go to urgent care or ER.  She is declining recommendation to come in here.  We will do the Cipro as per her request but warning signs discussed very carefully

## 2019-06-15 ENCOUNTER — Encounter: Payer: Self-pay | Admitting: Family Medicine

## 2019-06-18 ENCOUNTER — Encounter: Payer: Self-pay | Admitting: Family Medicine

## 2019-06-21 ENCOUNTER — Ambulatory Visit (INDEPENDENT_AMBULATORY_CARE_PROVIDER_SITE_OTHER): Payer: Self-pay | Admitting: Internal Medicine

## 2019-07-13 ENCOUNTER — Other Ambulatory Visit: Payer: Self-pay | Admitting: Family Medicine

## 2019-07-24 ENCOUNTER — Other Ambulatory Visit: Payer: Self-pay

## 2019-07-24 DIAGNOSIS — Z20822 Contact with and (suspected) exposure to covid-19: Secondary | ICD-10-CM

## 2019-07-26 LAB — NOVEL CORONAVIRUS, NAA: SARS-CoV-2, NAA: NOT DETECTED

## 2019-09-09 ENCOUNTER — Other Ambulatory Visit: Payer: Self-pay

## 2019-10-11 ENCOUNTER — Other Ambulatory Visit: Payer: Self-pay | Admitting: Family Medicine

## 2019-10-11 NOTE — Telephone Encounter (Signed)
addressed at end of 11 19 visit, rec [id number] all meds times one, sched chroinic visit

## 2019-10-12 ENCOUNTER — Other Ambulatory Visit: Payer: Self-pay

## 2019-10-12 ENCOUNTER — Ambulatory Visit (INDEPENDENT_AMBULATORY_CARE_PROVIDER_SITE_OTHER): Payer: PPO | Admitting: Family Medicine

## 2019-10-12 DIAGNOSIS — I10 Essential (primary) hypertension: Secondary | ICD-10-CM | POA: Diagnosis not present

## 2019-10-12 DIAGNOSIS — E039 Hypothyroidism, unspecified: Secondary | ICD-10-CM | POA: Diagnosis not present

## 2019-10-12 MED ORDER — LISINOPRIL 10 MG PO TABS: ORAL_TABLET | ORAL | 1 refills | Status: DC

## 2019-10-12 MED ORDER — LEVOTHYROXINE SODIUM 175 MCG PO TABS: ORAL_TABLET | ORAL | 1 refills | Status: DC

## 2019-10-12 MED ORDER — GABAPENTIN 100 MG PO CAPS: 100.0000 mg | ORAL_CAPSULE | Freq: Two times a day (BID) | ORAL | 5 refills | Status: DC

## 2019-10-12 MED ORDER — SERTRALINE HCL 100 MG PO TABS: 150.0000 mg | ORAL_TABLET | Freq: Every day | ORAL | 1 refills | Status: DC

## 2019-10-12 NOTE — Progress Notes (Signed)
   Subjective:  Audio  Patient ID: [name], female    DOB: 01/13/1956, 63 y.o.   MRN: [id number]  HPI hypothyroid. Takes levothyroxine. Pt states she thinks it is time to check her thyroid again.   Has appt upcoming with dr Laural Golden on November 9th.   PHQ9 done.   Pt states she gave blood yesterday and the lady drawing her blood told her that her bp was elevated a little bit but not too bad.   Virtual Visit via Telephone Note  I connected with [name] on 10/12/19 at 10:00 AM EST by telephone and verified that I am speaking with the correct person using two identifiers.  Location: Patient: home Provider: office   I discussed the limitations, risks, security and privacy concerns of performing an evaluation and management service by telephone and the availability of in person appointments. I also discussed with the patient that there may be a patient responsible charge related to this service. The patient expressed understanding and agreed to proceed.   History of Present Illness:    Observations/Objective:   Assessment and Plan:   Follow Up Instructions:    I discussed the assessment and treatment plan with the patient. The patient was provided an opportunity to ask questions and all were answered. The patient agreed with the plan and demonstrated an understanding of the instructions.   The patient was advised to call back or seek an in-person evaluation if the symptoms worsen or if the condition fails to improve as anticipated.  I provided 30minutes of non-face-to-face time during this encounter.   Blood pressure medicine and blood pressure levels reviewed today with patient. Compliant with blood pressure medicine. States does not miss a dose. No obvious side effects. Blood pressure generally good when checked elsewhere. Watching salt intake.  bp was up slightly  Patient notes ongoing compliance with antidepressant medication. No obvious side effects. Reports  does not miss a dose. Overall continues to help depression substantially. No thoughts of homicide or suicide. Would like to maintain medication.  Patient compliant with thyroid medicine.  Fatigued sometimes.  Was due to have blood work reassessed.  Needs to have this done.  Discussed.  Review of Systems No headache, no major weight loss or weight gain, no chest pain no back pain abdominal pain no change in bowel habits complete ROS otherwise negative     Objective:   Physical Exam   Virtual     Assessment & Plan:  Impression hypertension.  Apparent good control medication refill diet exercise discussed  2.  Depression patient states overall clinically quite stable  3.  Hypothyroidism.  Status uncertain time for blood work discussed recommend  Medications refilled diet exercise discussed blood work done

## 2019-10-14 ENCOUNTER — Encounter: Payer: Self-pay | Admitting: Family Medicine

## 2019-10-16 ENCOUNTER — Other Ambulatory Visit: Payer: Self-pay

## 2019-10-16 ENCOUNTER — Encounter (INDEPENDENT_AMBULATORY_CARE_PROVIDER_SITE_OTHER): Payer: Self-pay | Admitting: Internal Medicine

## 2019-10-16 ENCOUNTER — Ambulatory Visit (INDEPENDENT_AMBULATORY_CARE_PROVIDER_SITE_OTHER): Payer: Self-pay | Admitting: Internal Medicine

## 2019-10-16 ENCOUNTER — Encounter (INDEPENDENT_AMBULATORY_CARE_PROVIDER_SITE_OTHER): Payer: Self-pay | Admitting: *Deleted

## 2019-10-16 ENCOUNTER — Other Ambulatory Visit (INDEPENDENT_AMBULATORY_CARE_PROVIDER_SITE_OTHER): Payer: Self-pay | Admitting: Internal Medicine

## 2019-10-16 VITALS — BP 124/79 | HR 77 | Temp 98.0°F | Ht 62.0 in | Wt 170.3 lb

## 2019-10-16 DIAGNOSIS — R140 Abdominal distension (gaseous): Secondary | ICD-10-CM | POA: Diagnosis not present

## 2019-10-16 DIAGNOSIS — R1013 Epigastric pain: Secondary | ICD-10-CM

## 2019-10-16 DIAGNOSIS — K5904 Chronic idiopathic constipation: Secondary | ICD-10-CM | POA: Diagnosis not present

## 2019-10-16 DIAGNOSIS — E039 Hypothyroidism, unspecified: Secondary | ICD-10-CM | POA: Diagnosis not present

## 2019-10-16 MED ORDER — PANTOPRAZOLE SODIUM 40 MG PO TBEC: 40.0000 mg | DELAYED_RELEASE_TABLET | Freq: Every day | ORAL | 5 refills | Status: DC

## 2019-10-16 MED ORDER — POLYETHYLENE GLYCOL 3350 17 GM/SCOOP PO POWD: 17.0000 g | Freq: Every day | ORAL | 5 refills | Status: DC

## 2019-10-16 NOTE — Progress Notes (Signed)
Presenting complaint;  Abdominal distention epigastric pain and constipation.  History of present illness  Patient is 62-year-old Caucasian female who is referred through courtesy of Dr. Steve looking for GI evaluation.  Patient has history of GERD constipation and small intestinal bacterial overgrowth who was last seen in 2014 and did not return for follow-up visit.  Patient states she lost her insurance then. She states she was doing fair until about 4 months ago when she developed progressive abdominal distention associated with constipation and she went 9 days without a bowel movement despite using suppositories and taking mineral oil.  She was at [place] then.  She did virtual visit with Dr. Steve Luking on on 06/13/2019.  She was afraid to go to emergency room.  With history of small intestinal bacterial overgrowth she was prescribed Cipro which she took for 1 month.  She states she is feeling much better but abdominal distention has not decreased.  Her bowels are never regular.  She may go 3 days without a bowel movement.  She has tried Linzess in the past but she developed itching in her scalp.  She is now using vitamins laxative may be once a week.  She has good appetite.  She has not lost any weight recently.  She said she eats snacks and one big meal a day usually is an evening meal.  She denies melena or rectal bleeding.  Her last colonoscopy was in 2007. She also complains of epigastric pain which started few weeks ago.  Pain may may occur 2 to 3 hours after a meal on when she is really hungry.  It may last 1 hour.  She describes this pain to be like to toothache but never intractable.  Pain may last for 1 hour.  She states rubbing her upper abdomen helps.  This pain is not associated with nausea or vomiting.  She has heartburn infrequently.  She takes Naprosyn no more than 3-4 times in a month.  She has neck and back pain.  She is worried about this pain because her sister had mesenteric  ischemia and died from complications following surgery. She says she used to be very active before onset of Covid-19 pandemic.  She does not do much walking or physical activity anymore.  Current Medications: Outpatient Encounter Medications as of 10/16/2019  Medication Sig  . Acetaminophen (TYLENOL ARTHRITIS PAIN PO) Take by mouth as needed.  . gabapentin (NEURONTIN) 100 MG capsule 100 mg. Patient states that she takes as needed for back.  . ibuprofen (ADVIL) 200 MG tablet Take 200 mg by mouth as needed.  . levothyroxine (SYNTHROID) 175 MCG tablet Take one tablet daily  . lisinopril (ZESTRIL) 10 MG tablet TAKE (1) TABLET BY MOUTH ONCE DAILY.  . PREVIDENT 5000 BOOSTER 1.1 % PSTE APPLY THIN RIBBON ANDEBRUSH FOR 2 MINUTES AT BEDTIME. SPIT AFTER BRUSH.  . sertraline (ZOLOFT) 100 MG tablet Take 1.5 tablets (150 mg total) by mouth daily.  . valACYclovir (VALTREX) 1000 MG tablet Take one tid for 7 days  . [DISCONTINUED] albuterol (PROVENTIL HFA;VENTOLIN HFA) 108 (90 Base) MCG/ACT inhaler Inhale 2 puffs into the lungs every 6 (six) hours as needed for wheezing. (Patient not taking: Reported on 04/07/2019)  . [DISCONTINUED] gabapentin (NEURONTIN) 100 MG capsule Take 1 capsule (100 mg total) by mouth 2 (two) times daily. (Patient not taking: Reported on 10/16/2019)  . [DISCONTINUED] HYDROcodone-acetaminophen (NORCO/VICODIN) 5-325 MG tablet Take one every 6 hours as needed for pain (Patient not taking: Reported on 07/08/2018)  . [  DISCONTINUED] phenazopyridine (PYRIDIUM) 100 MG tablet Take 1 tablet (100 mg total) by mouth 3 (three) times daily as needed for pain. (Patient not taking: Reported on 04/07/2019)   No facility-administered encounter medications on file as of 10/16/2019.    Past medical history;  Bilateral tubal ligation 1977. Cholecystectomy in 1977. Hysterectomy in 1992. Tonsillectomy 1997. Lumbar spine surgery fordisc disease in November 2008 Hypertension diagnosed 25 years ago. Hypothyroidism  diagnosed 25years. Small bowel bacterial overgrowth was diagnosed in November 2009 based on hydrogen breath test. History of depression. Chronic GERD. EGD in March 2007 revealing hyperplastic gastric polyp. Colonoscopy in March 2007 revealing internal hemorrhoids. Surgery  right hand for carpal tunnel and release of middle trigger finger 06/2012. Neck fusion in 07/2017.  Allergies  Allergen Reactions  . Penicillins Hives   Family history  Father had COPD and died at 57.  Mother also had COPD and lived to be 72.  Her sister who died at age 65 also had COPD.  Her other sister had mesenteric ischemia and died following surgery.  Social history  She is married and has 2 children.  Her son is 45 years old and daughter is 43 and they are in good health.  Patient works at [employer] for few years and at [employer] for 7 years.  She is now on disability on account of her neck and back problems.   Physical examination Blood pressure 124/79, pulse 77, temperature 98 F (36.7 C), temperature source Oral, height 5' 2" (1.575 m), weight 170 lb 4.8 oz (77.2 kg). Patient is alert and in no acute distress. Conjunctiva is pink. Sclera is nonicteric Oropharyngeal mucosa is normal. No neck masses or thyromegaly noted. Cardiac exam with regular rhythm normal S1 and S2. No murmur or gallop noted. Lungs are clear to auscultation. Abdomen is distended.  Bowel sounds are normal.  No bruit noted.  Percussion note is not very tympanic.  Abdomen is soft with mild to moderate midepigastric tenderness.  No organomegaly or masses. No LE edema or clubbing noted.  Labs/studies Results:  CBC Latest Ref Rng & Units 04/10/2019 05/07/2016 11/23/2015  WBC 3.4 - 10.8 x10E3/uL 6.0 - 6.8  Hemoglobin 11.1 - 15.9 g/dL 13.6 13.5 14.7  Hematocrit 34.0 - 46.6 % 39.7 - 43.8  Platelets 150 - 450 x10E3/uL 247 - 218    CMP Latest Ref Rng & Units 04/10/2019 06/10/2017 12/04/2016  Glucose 65 - 99 mg/dL 101(H) 105(H) 107(H)  BUN 8 - 27 mg/dL  14 - 13  Creatinine 0.57 - 1.00 mg/dL 0.95 - 0.78  Sodium 134 - 144 mmol/L 139 - 141  Potassium 3.5 - 5.2 mmol/L 4.7 - 5.1  Chloride 96 - 106 mmol/L 104 - 102  CO2 20 - 29 mmol/L 22 - 24  Calcium 8.7 - 10.3 mg/dL 9.4 - 9.6  Total Protein 6.0 - 8.5 g/dL 7.0 - 7.5  Total Bilirubin 0.0 - 1.2 mg/dL <0.2 - <0.2  Alkaline Phos 39 - 117 IU/L 63 - 80  AST 0 - 40 IU/L 20 - 17  ALT 0 - 32 IU/L 26 - 20    Hepatic Function Latest Ref Rng & Units 04/10/2019 12/04/2016 11/23/2015  Total Protein 6.0 - 8.5 g/dL 7.0 7.5 7.2  Albumin 3.8 - 4.8 g/dL 4.3 4.3 4.4  AST 0 - 40 IU/L 20 17 16  ALT 0 - 32 IU/L 26 20 21  Alk Phosphatase 39 - 117 IU/L 63 80 81  Total Bilirubin 0.0 - 1.2 mg/dL <0.2 <0.2 0.3    Bilirubin, Direct 0.00 - 0.40 mg/dL 0.06 0.05 0.09    TSH on 04/10/2019 was 5.430 (0.450-4.500)  Assessment:  #1.  Abdominal distention and epigastric pain.  She has a history of small intestinal bacterial overgrowth diagnosed in November 2009 when she had hydrogen breath test.  All of her symptoms cannot be explained on the basis of this condition.  She did not get long-lasting relieved with 1 month of Cipro.  She could have peptic ulcer disease or dyspepsia.  She does not have risk factors for mesenteric ischemia.  She will be further evaluated with abdominal pelvic CT and if this study is negative would consider diagnostic EGD.  In the meantime we will treat her with PPI.  #2.  Chronic constipation.  She experience side effects with Linzess/linaclotide and now is using OTC laxatives.  She needs to be on medication on schedule in order to prevent constipation rather than using medication on as-needed basis.  Her constipation could be contributing to her bloating and abdominal distention.  #3.  Patient is average risk for CRC.  Last colonoscopy was in March 2017 revealed internal hemorrhoids and single diverticulum at sigmoid and/or descending colon.  She is long overdue for screening  colonoscopy.   Plan:  Patient will go to the lab for CBC, comprehensive chemistry panel and TSH. Abdominal pelvic CT with contrast. Patient advised to eat 3 small meals rather than one big meal daily. Pantoprazole 40 mg by mouth 30 minutes before breakfast daily. Polyethylene glycol 8.5 to 17 g by mouth daily at bedtime. Patient advised to keep stool diary as to frequency and consistency of stools for the next 1 month. If CT is unremarkable will proceed with diagnostic esophagogastroduodenoscopy at which time she will also undergo average risk screening colonoscopy.      

## 2019-10-16 NOTE — Patient Instructions (Signed)
Please eat 3 small meals rather than one big meal daily. Stool diary as to frequency and consistency of stools for the next 30 days. Start with polyethylene glycol at a dose of 17 g by mouth daily at bedtime and can decrease dose to three fourth of a cup or half a cup if necessary. Physician will call with results of blood work and further recommendations.

## 2019-10-17 LAB — COMPREHENSIVE METABOLIC PANEL
ALT: 19 IU/L (ref 0–32)
AST: 19 IU/L (ref 0–40)
Albumin/Globulin Ratio: 1.4 (ref 1.2–2.2)
Albumin: 4.2 g/dL (ref 3.8–4.8)
Alkaline Phosphatase: 67 IU/L (ref 39–117)
BUN/Creatinine Ratio: 18 (ref 12–28)
BUN: 16 mg/dL (ref 8–27)
Bilirubin Total: 0.2 mg/dL (ref 0.0–1.2)
CO2: 23 mmol/L (ref 20–29)
Calcium: 9.8 mg/dL (ref 8.7–10.3)
Chloride: 104 mmol/L (ref 96–106)
Creatinine, Ser: 0.89 mg/dL (ref 0.57–1.00)
GFR calc Af Amer: 80 mL/min/{1.73_m2} (ref >59)
GFR calc non Af Amer: 70 mL/min/{1.73_m2} (ref >59)
Globulin, Total: 2.9 g/dL (ref 1.5–4.5)
Glucose: 88 mg/dL (ref 65–99)
Potassium: 4.6 mmol/L (ref 3.5–5.2)
Sodium: 141 mmol/L (ref 134–144)
Total Protein: 7.1 g/dL (ref 6.0–8.5)

## 2019-10-17 LAB — CBC/DIFF AMBIGUOUS DEFAULT
Basophils Absolute: 0.1 10*3/uL (ref 0.0–0.2)
Basos: 1 %
EOS (ABSOLUTE): 0.2 10*3/uL (ref 0.0–0.4)
Eos: 3 %
Hematocrit: 36.5 % (ref 34.0–46.6)
Hemoglobin: 12.3 g/dL (ref 11.1–15.9)
Immature Grans (Abs): 0 10*3/uL (ref 0.0–0.1)
Immature Granulocytes: 0 %
Lymphocytes Absolute: 2.4 10*3/uL (ref 0.7–3.1)
Lymphs: 34 %
MCH: 32.1 pg (ref 26.6–33.0)
MCHC: 33.7 g/dL (ref 31.5–35.7)
MCV: 95 fL (ref 79–97)
Monocytes Absolute: 0.6 10*3/uL (ref 0.1–0.9)
Monocytes: 8 %
Neutrophils Absolute: 3.8 10*3/uL (ref 1.4–7.0)
Neutrophils: 54 %
Platelets: 237 10*3/uL (ref 150–450)
RBC: 3.83 x10E6/uL (ref 3.77–5.28)
RDW: 13.2 % (ref 11.7–15.4)
WBC: 7 10*3/uL (ref 3.4–10.8)

## 2019-10-17 LAB — TSH: TSH: 0.138 u[IU]/mL — ABNORMAL LOW (ref 0.450–4.500)

## 2019-10-17 LAB — SPECIMEN STATUS REPORT

## 2019-10-18 ENCOUNTER — Other Ambulatory Visit: Payer: Self-pay | Admitting: *Deleted

## 2019-10-18 ENCOUNTER — Telehealth: Payer: Self-pay | Admitting: Family Medicine

## 2019-10-18 MED ORDER — LEVOTHYROXINE SODIUM 150 MCG PO TABS: ORAL_TABLET | ORAL | 1 refills | Status: DC

## 2019-10-18 NOTE — Telephone Encounter (Signed)
Pt saw on mychart thyroid is now 0.138 and would like to know what Dr. Richardson Landry wants her to do.

## 2019-10-18 NOTE — Telephone Encounter (Signed)
See result note. I called and discussed with pt.

## 2019-10-23 ENCOUNTER — Ambulatory Visit (HOSPITAL_COMMUNITY)
Admission: RE | Admit: 2019-10-23 | Discharge: 2019-10-23 | Disposition: A | Discharge status: Home / Self Care | Payer: PPO | Source: Ambulatory Visit | Attending: Internal Medicine | Admitting: Internal Medicine

## 2019-10-23 ENCOUNTER — Other Ambulatory Visit: Payer: Self-pay

## 2019-10-23 DIAGNOSIS — K760 Fatty (change of) liver, not elsewhere classified: Secondary | ICD-10-CM | POA: Insufficient documentation

## 2019-10-23 DIAGNOSIS — R140 Abdominal distension (gaseous): Secondary | ICD-10-CM | POA: Diagnosis not present

## 2019-10-23 DIAGNOSIS — I700 Atherosclerosis of aorta: Secondary | ICD-10-CM | POA: Diagnosis not present

## 2019-10-23 DIAGNOSIS — R197 Diarrhea, unspecified: Secondary | ICD-10-CM | POA: Diagnosis not present

## 2019-10-23 DIAGNOSIS — K5730 Diverticulosis of large intestine without perforation or abscess without bleeding: Secondary | ICD-10-CM | POA: Diagnosis not present

## 2019-10-23 MED ORDER — IOHEXOL 300 MG/ML  SOLN
100.0000 mL | Freq: Once | INTRAMUSCULAR | Status: AC | PRN
  Administered 2019-10-23: 100 mL via INTRAVENOUS

## 2019-10-27 ENCOUNTER — Other Ambulatory Visit (INDEPENDENT_AMBULATORY_CARE_PROVIDER_SITE_OTHER): Payer: Self-pay | Admitting: *Deleted

## 2019-10-27 DIAGNOSIS — K769 Liver disease, unspecified: Secondary | ICD-10-CM

## 2019-10-31 ENCOUNTER — Telehealth (INDEPENDENT_AMBULATORY_CARE_PROVIDER_SITE_OTHER): Payer: Self-pay | Admitting: *Deleted

## 2019-10-31 NOTE — Telephone Encounter (Signed)
Patient called in to cancel her MRI for 11/03/19 - stated she talked to her insurance company and they advised her to wait until after first of year so it will go to her deductible - she will call me after 1st of year to reschedule

## 2019-11-03 ENCOUNTER — Ambulatory Visit (HOSPITAL_COMMUNITY): Payer: PPO

## 2019-11-06 DIAGNOSIS — H524 Presbyopia: Secondary | ICD-10-CM | POA: Diagnosis not present

## 2019-11-21 ENCOUNTER — Encounter: Payer: Self-pay | Admitting: Family Medicine

## 2019-11-21 ENCOUNTER — Ambulatory Visit (INDEPENDENT_AMBULATORY_CARE_PROVIDER_SITE_OTHER): Payer: PPO | Admitting: Family Medicine

## 2019-11-21 DIAGNOSIS — J310 Chronic rhinitis: Secondary | ICD-10-CM | POA: Diagnosis not present

## 2019-11-21 DIAGNOSIS — Z20822 Contact with and (suspected) exposure to covid-19: Secondary | ICD-10-CM

## 2019-11-21 DIAGNOSIS — Z20828 Contact with and (suspected) exposure to other viral communicable diseases: Secondary | ICD-10-CM | POA: Diagnosis not present

## 2019-11-21 DIAGNOSIS — J329 Chronic sinusitis, unspecified: Secondary | ICD-10-CM | POA: Diagnosis not present

## 2019-11-21 MED ORDER — AZITHROMYCIN 250 MG PO TABS: ORAL_TABLET | ORAL | 0 refills | Status: DC

## 2019-11-21 NOTE — Progress Notes (Signed)
   Subjective:  Audio only  Patient ID: [name], female    DOB: 1956-09-13, 63 y.o.   MRN: [id number]  Sinusitis This is a new problem. Episode onset: 4 days. Associated symptoms include congestion, ear pain and headaches. Treatments tried: mucinex, sudafed.  pt states she gets same symptoms every year around this time.   Virtual Visit via Telephone Note  I connected with [name] on 11/21/19 at  3:30 PM EST by telephone and verified that I am speaking with the correct person using two identifiers.  Location: Patient: home Provider: office   I discussed the limitations, risks, security and privacy concerns of performing an evaluation and management service by telephone and the availability of in person appointments. I also discussed with the patient that there may be a patient responsible charge related to this service. The patient expressed understanding and agreed to proceed.   History of Present Illness:    Observations/Objective:   Assessment and Plan:   Follow Up Instructions:    I discussed the assessment and treatment plan with the patient. The patient was provided an opportunity to ask questions and all were answered. The patient agreed with the plan and demonstrated an understanding of the instructions.   The patient was advised to call back or seek an in-person evaluation if the symptoms worsen or if the condition fails to improve as anticipated.  I provid20 minutes of non-face-to-face time during this encounter.  Notes sinus congestion   Started with a bad headache  Has had nasal cong   Sudafed amd mucinex  No fever  Feels like going into the chest   h dept    Review of Systems  HENT: Positive for congestion and ear pain.   Neurological: Positive for headaches.       Objective:   Physical Exam   Virtual     Assessment & Plan:  Impression potential rhinosinusitis.  However also potential exposures with grandkids.  COVID-19  testing strongly recommended.  Precautions discussed.  Antibiotics prescribed.  Warning signs discussed

## 2019-11-23 ENCOUNTER — Other Ambulatory Visit: Payer: Self-pay

## 2019-11-23 ENCOUNTER — Ambulatory Visit: Payer: PPO | Attending: Internal Medicine

## 2019-11-23 DIAGNOSIS — Z20828 Contact with and (suspected) exposure to other viral communicable diseases: Secondary | ICD-10-CM | POA: Diagnosis not present

## 2019-11-23 DIAGNOSIS — Z20822 Contact with and (suspected) exposure to covid-19: Secondary | ICD-10-CM

## 2019-11-24 LAB — NOVEL CORONAVIRUS, NAA: SARS-CoV-2, NAA: NOT DETECTED

## 2019-12-18 ENCOUNTER — Telehealth (INDEPENDENT_AMBULATORY_CARE_PROVIDER_SITE_OTHER): Payer: Self-pay | Admitting: Internal Medicine

## 2019-12-18 ENCOUNTER — Encounter (INDEPENDENT_AMBULATORY_CARE_PROVIDER_SITE_OTHER): Payer: Self-pay | Admitting: *Deleted

## 2019-12-18 ENCOUNTER — Other Ambulatory Visit (INDEPENDENT_AMBULATORY_CARE_PROVIDER_SITE_OTHER): Payer: Self-pay | Admitting: *Deleted

## 2019-12-18 ENCOUNTER — Telehealth: Payer: Self-pay | Admitting: Family Medicine

## 2019-12-18 DIAGNOSIS — M1990 Unspecified osteoarthritis, unspecified site: Secondary | ICD-10-CM

## 2019-12-18 DIAGNOSIS — R1013 Epigastric pain: Secondary | ICD-10-CM

## 2019-12-18 DIAGNOSIS — K219 Gastro-esophageal reflux disease without esophagitis: Secondary | ICD-10-CM

## 2019-12-18 MED ORDER — PANTOPRAZOLE SODIUM 40 MG PO TBEC: 40.0000 mg | DELAYED_RELEASE_TABLET | Freq: Every day | ORAL | 3 refills | Status: DC

## 2019-12-18 NOTE — Telephone Encounter (Signed)
Patient left a message wanting to know if she could get a 90 day supply on her pantoprazole - please advise - [phone]

## 2019-12-18 NOTE — Telephone Encounter (Signed)
Patient notified

## 2019-12-18 NOTE — Telephone Encounter (Signed)
Patient would like a referral to a Rheumatologist for her arthritis.  She doesn't need a referral for her insurance but the office she wants to go to is requesting one. Dr Sherron Ales [facility] office-  [address] Ph: [phone] Fax # [phone]

## 2019-12-18 NOTE — Telephone Encounter (Addendum)
Referral ordered in Epic. Left message to return call to notify patient. 

## 2019-12-18 NOTE — Telephone Encounter (Signed)
A refill request for a 90 supply has been sent to [facility].

## 2019-12-18 NOTE — Telephone Encounter (Signed)
sure

## 2019-12-27 ENCOUNTER — Telehealth (INDEPENDENT_AMBULATORY_CARE_PROVIDER_SITE_OTHER): Payer: Self-pay | Admitting: Internal Medicine

## 2019-12-27 NOTE — Telephone Encounter (Signed)
Please advise 

## 2019-12-27 NOTE — Telephone Encounter (Signed)
Patient left message stating she is having a lot of pain on her right side - wants to know if the mass is on her right side - please advise - [phone]

## 2019-12-28 ENCOUNTER — Ambulatory Visit (HOSPITAL_COMMUNITY)
Admission: RE | Admit: 2019-12-28 | Discharge: 2019-12-28 | Disposition: A | Discharge status: Home / Self Care | Payer: PPO | Source: Ambulatory Visit | Attending: Family Medicine | Admitting: Family Medicine

## 2019-12-28 ENCOUNTER — Ambulatory Visit (INDEPENDENT_AMBULATORY_CARE_PROVIDER_SITE_OTHER): Payer: PPO | Admitting: Family Medicine

## 2019-12-28 ENCOUNTER — Other Ambulatory Visit: Payer: Self-pay

## 2019-12-28 ENCOUNTER — Encounter: Payer: Self-pay | Admitting: Family Medicine

## 2019-12-28 VITALS — BP 142/82 | Temp 97.7°F | Wt 177.4 lb

## 2019-12-28 DIAGNOSIS — M1990 Unspecified osteoarthritis, unspecified site: Secondary | ICD-10-CM | POA: Insufficient documentation

## 2019-12-28 DIAGNOSIS — M25511 Pain in right shoulder: Secondary | ICD-10-CM | POA: Insufficient documentation

## 2019-12-28 DIAGNOSIS — M19041 Primary osteoarthritis, right hand: Secondary | ICD-10-CM | POA: Diagnosis not present

## 2019-12-28 MED ORDER — SERTRALINE HCL 100 MG PO TABS: 150.0000 mg | ORAL_TABLET | Freq: Every day | ORAL | 1 refills | Status: DC

## 2019-12-28 MED ORDER — LEVOTHYROXINE SODIUM 150 MCG PO TABS: ORAL_TABLET | ORAL | 1 refills | Status: DC

## 2019-12-28 MED ORDER — LISINOPRIL 20 MG PO TABS: ORAL_TABLET | ORAL | 1 refills | Status: DC

## 2019-12-28 NOTE — Progress Notes (Signed)
   Subjective:    Patient ID: [name], female    DOB: [birth year], 64 y.o.   MRN: [id number]  HPI Pt is having arthritis in hands and shoulders. Pt states she can not squeeze hands together. Pt has knots on fingers. No strength in hands. Shoulders aches all time. Right side is worse. Pt is taking Tylenol Arthritis; not helping much any more.   Having progressivepain in the right shouldr   finges are having more and more pain   Hands are stiff   troublr opening a jar or bottle   Hands  Are very weak   Very stiff first thing in the morning    Aching and pain   Family hx of  Arthritis  Mo had it osteo   Hs a niee sith rheumatoid and sister has a hx of rheumatoid    Progressive pain with the shoulder over the years      Review of Systems No headache, no major weight loss or weight gain, no chest pain no back pain abdominal pain no change in bowel habits complete ROS otherwise negative     Objective:   Physical Exam  Alert and oriented, vitals reviewed and stable, NAD ENT-TM's and ext canals WNL bilat via otoscopic exam Soft palate, tonsils and post pharynx WNL via oropharyngeal exam Neck-symmetric, no masses; thyroid nonpalpable and nontender Pulmonary-no tachypnea or accessory muscle use; Clear without wheezes via auscultation Card--no abnrml murmurs, rhythm reg and rate WNL Carotid pulses symmetric, without bruits Right shoulder.  Positive impingement sign.  Bilateral hands.  Impressive Heberden's nodes noted.  Also question some metacarpal phalangeal swelling both sides no longer deviation.  Patient's right thumb also appears more inflamed with tenderness and pain in even it asymmetrical deviation      Assessment & Plan:  #1 progressive right shoulder pain.  With element of frozen shoulder and impingement sign.  Orthopedic referral rationale discussed  2.  Progressive arthritis.  Fairly severe symptomatology see present illness.  We will do a hand  x-ray of the right hand along with appropriate blood work.  Patient would specifically like to see a rheumatologist.  I have advised her I hope the rheumatologist will see her after we finish our work-up.  Need to see if there is evidence of rheumatoid arthritis.  There is family history of this.  Also we need to see the extent of the arthritis.  Weight x-rays and blood work.  3.  Hypertension suboptimal control discussed increased dose  All chronic meds refilled follow-up in 6 months diet exercise discussed further recommendations based on results  Greater than 50% of this 30 minute face to face visit was spent in counseling and discussion and coordination of care regarding the above diagnosis/diagnosies

## 2019-12-28 NOTE — Telephone Encounter (Signed)
Chart reviewed - CT from Nov 2020 - IMPRESSION: Colonic diverticulosis.  No radiographic evidence of diverticulitis.  New indeterminate 11 mm low-attenuation lesion in segment 4B of left hepatic lobe. Abdomen MRI without and with contrast is recommended for further characterization.  Hepatic steatosis.  Aortic Atherosclerosis (ICD10-I70.0).    I assume she is referring to the "mass" as the 1.1 cm lesion in her liver, liver is located on the right side. Dr Karilyn Cota last saw her in November.  If she is having a new onset of pain I am happy to do a televisit with her to discuss further.  It also looks like Dr. Karilyn Cota wanted to follow-up with an abdominal MRI and she wanted to wait till after first year to schedule this due to insurance.  Okay to schedule she would like.

## 2019-12-29 LAB — RHEUMATOID FACTOR: Rheumatoid fact SerPl-aCnc: <10 IU/mL (ref 0.0–13.9)

## 2019-12-29 LAB — C-REACTIVE PROTEIN: CRP: 5 mg/L (ref 0–10)

## 2019-12-29 LAB — SEDIMENTATION RATE: Sed Rate: 13 mm/hr (ref 0–40)

## 2019-12-29 LAB — ANA: Anti Nuclear Antibody (ANA): NEGATIVE

## 2020-01-02 DIAGNOSIS — M19011 Primary osteoarthritis, right shoulder: Secondary | ICD-10-CM | POA: Diagnosis not present

## 2020-01-02 DIAGNOSIS — M25511 Pain in right shoulder: Secondary | ICD-10-CM | POA: Diagnosis not present

## 2020-01-02 DIAGNOSIS — M7541 Impingement syndrome of right shoulder: Secondary | ICD-10-CM | POA: Diagnosis not present

## 2020-01-02 NOTE — Telephone Encounter (Signed)
Patient is scheduled for MRI Abdomen this Friday 01/05/2020. Dr.Rehman is aware

## 2020-01-04 ENCOUNTER — Other Ambulatory Visit: Payer: Self-pay | Admitting: Family Medicine

## 2020-01-04 DIAGNOSIS — E669 Obesity, unspecified: Secondary | ICD-10-CM | POA: Diagnosis not present

## 2020-01-04 DIAGNOSIS — Z6832 Body mass index (BMI) 32.0-32.9, adult: Secondary | ICD-10-CM | POA: Diagnosis not present

## 2020-01-04 DIAGNOSIS — M2550 Pain in unspecified joint: Secondary | ICD-10-CM | POA: Diagnosis not present

## 2020-01-04 DIAGNOSIS — M79643 Pain in unspecified hand: Secondary | ICD-10-CM

## 2020-01-04 DIAGNOSIS — M150 Primary generalized (osteo)arthritis: Secondary | ICD-10-CM | POA: Diagnosis not present

## 2020-01-05 ENCOUNTER — Other Ambulatory Visit: Payer: Self-pay

## 2020-01-05 ENCOUNTER — Ambulatory Visit (HOSPITAL_COMMUNITY)
Admission: RE | Admit: 2020-01-05 | Discharge: 2020-01-05 | Disposition: A | Discharge status: Home / Self Care | Payer: PPO | Source: Ambulatory Visit | Attending: Internal Medicine | Admitting: Internal Medicine

## 2020-01-05 ENCOUNTER — Ambulatory Visit (HOSPITAL_COMMUNITY): Admission: RE | Admit: 2020-01-05 | Payer: PPO | Source: Ambulatory Visit

## 2020-01-05 DIAGNOSIS — K7689 Other specified diseases of liver: Secondary | ICD-10-CM | POA: Diagnosis not present

## 2020-01-05 DIAGNOSIS — K769 Liver disease, unspecified: Secondary | ICD-10-CM | POA: Diagnosis not present

## 2020-01-05 LAB — POCT I-STAT CREATININE: Creatinine, Ser: 0.8 mg/dL (ref 0.44–1.00)

## 2020-01-05 MED ORDER — GADOBUTROL 1 MMOL/ML IV SOLN
5.0000 mL | Freq: Once | INTRAVENOUS | Status: AC | PRN
  Administered 2020-01-05: 5 mL via INTRAVENOUS

## 2020-01-11 ENCOUNTER — Encounter: Payer: Self-pay | Admitting: Family Medicine

## 2020-01-12 ENCOUNTER — Telehealth (INDEPENDENT_AMBULATORY_CARE_PROVIDER_SITE_OTHER): Payer: Self-pay | Admitting: Internal Medicine

## 2020-01-12 NOTE — Telephone Encounter (Signed)
Patient was called . Unable to get through on home phone. Cell phone a message was left. Patient had stated earlier that she was in a lot of pain.  Results are in imagining.

## 2020-01-12 NOTE — Telephone Encounter (Signed)
Patient called regarding test results - also stated she is in a lot of pain - please advise - [phone]

## 2020-01-29 DIAGNOSIS — M7541 Impingement syndrome of right shoulder: Secondary | ICD-10-CM | POA: Diagnosis not present

## 2020-02-13 ENCOUNTER — Ambulatory Visit (INDEPENDENT_AMBULATORY_CARE_PROVIDER_SITE_OTHER): Payer: PPO | Admitting: Otolaryngology

## 2020-02-13 ENCOUNTER — Other Ambulatory Visit: Payer: Self-pay

## 2020-02-13 ENCOUNTER — Encounter (INDEPENDENT_AMBULATORY_CARE_PROVIDER_SITE_OTHER): Payer: Self-pay | Admitting: Otolaryngology

## 2020-02-13 VITALS — Temp 97.9°F

## 2020-02-13 DIAGNOSIS — H6123 Impacted cerumen, bilateral: Secondary | ICD-10-CM | POA: Diagnosis not present

## 2020-02-13 DIAGNOSIS — H903 Sensorineural hearing loss, bilateral: Secondary | ICD-10-CM | POA: Diagnosis not present

## 2020-02-13 NOTE — Progress Notes (Signed)
HPI: [name] is a 64 y.o. female who presents for evaluation of hearing loss and wax buildup in her ears.  [facility] that demonstrated bilateral sensorineural hearing loss but worse in the left ear.  SRT's were 50 dB on the left and 30 dB on the right.  She had excellent word recognition.  She also has moderate wax buildup in her ears as noted on recent video otoscopy..  She has noted gradual hearing loss over the last 4 - 5 years She also complains of some nasal sinus issues with drainage.  She also has some trouble breathing through her nose.  Past Medical History:  Diagnosis Date  . Anxiety   . Arthritis   . Depression   . GERD (gastroesophageal reflux disease)   . Hypertension   . Hypothyroid   . Hypothyroidism   . PONV (postoperative nausea and vomiting)    Past Surgical History:  Procedure Laterality Date  . BACK SURGERY    . CARPAL TUNNEL RELEASE  06/16/2012   Procedure: CARPAL TUNNEL RELEASE;  Surgeon: Dominica Severin, MD;  Location: [facility];  Service: Orthopedics;  Laterality: Right;  limited open carpal tunnel release  . CERVICAL FUSION  2018  . CHOLECYSTECTOMY    . COLONOSCOPY    . TONSILLECTOMY    . TRIGGER FINGER RELEASE  06/16/2012   Procedure: RELEASE TRIGGER FINGER/A-1 PULLEY;  Surgeon: Dominica Severin, MD;  Location: [facility];  Service: Orthopedics;  Laterality: Right;  right middle a-1 release  . TUBAL LIGATION    . UPPER GASTROINTESTINAL ENDOSCOPY    . VAGINAL HYSTERECTOMY     Social History   Socioeconomic History  . Marital status: Married    Spouse name: Not on file  . Number of children: Not on file  . Years of education: 37  . Highest education level: Not on file  Occupational History  . Not on file  Tobacco Use  . Smoking status: Former Smoker    Packs/day: 1.50    Years: 27.00    Pack years: 40.50    Start date: 1973    Quit date: 2000    Years since quitting:  21.2  . Smokeless tobacco: Never Used  Substance and Sexual Activity  . Alcohol use: No  . Drug use: No  . Sexual activity: Not on file  Other Topics Concern  . Not on file  Social History Narrative  . Not on file   Social Determinants of Health   Financial Resource Strain:   . Difficulty of Paying Living Expenses: Not on file  Food Insecurity:   . Worried About Programme researcher, broadcasting/film/video in the Last Year: Not on file  . Ran Out of Food in the Last Year: Not on file  Transportation Needs:   . Lack of Transportation (Medical): Not on file  . Lack of Transportation (Non-Medical): Not on file  Physical Activity:   . Days of Exercise per Week: Not on file  . Minutes of Exercise per Session: Not on file  Stress:   . Feeling of Stress : Not on file  Social Connections:   . Frequency of Communication with Friends and Family: Not on file  . Frequency of Social Gatherings with Friends and Family: Not on file  . Attends Religious Services: Not on file  . Active Member of Clubs or Organizations: Not on file  . Attends Banker Meetings: Not on file  .  Marital Status: Not on file   Family History  Problem Relation Age of Onset  . COPD Mother   . Osteoporosis Mother   . COPD Father   . Emphysema Father   . COPD Sister   . Healthy Daughter   . Healthy Son   . Arthritis Other   . Lung disease Other   . Cancer Other   . Asthma Other    Allergies  Allergen Reactions  . Penicillins Hives   Prior to Admission medications   Medication Sig Start Date End Date Taking? Authorizing Provider  Acetaminophen (TYLENOL ARTHRITIS PAIN PO) Take by mouth as needed.    [provider]  gabapentin (NEURONTIN) 100 MG capsule 100 mg. Patient states that she takes as needed for back.    [provider]  ibuprofen (ADVIL) 200 MG tablet Take 200 mg by mouth as needed.    [provider]  levothyroxine (SYNTHROID) 150 MCG tablet Take one and a half tablets on Sunday  and one tablet all other days 12/28/19   Mikey Kirschner, MD  lisinopril (ZESTRIL) 20 MG tablet TAKE (1) TABLET BY MOUTH ONCE DAILY. 12/28/19   Mikey Kirschner, MD  pantoprazole (PROTONIX) 40 MG tablet Take 1 tablet (40 mg total) by mouth daily before breakfast. 10/16/19   Rehman, Mechele Dawley, MD  PREVIDENT 5000 BOOSTER 1.1 % PSTE APPLY THIN RIBBON ANDEBRUSH FOR 2 MINUTES AT BEDTIME. SPIT AFTER BRUSH. 10/06/19   [provider]  sertraline (ZOLOFT) 100 MG tablet Take 1.5 tablets (150 mg total) by mouth daily. 12/28/19   Mikey Kirschner, MD  valACYclovir (VALTREX) 1000 MG tablet Take one tid for 7 days 03/21/19   Mikey Kirschner, MD     Positive ROS: Otherwise negative  All other systems have been reviewed and were otherwise negative with the exception of those mentioned in the HPI and as above.  Physical Exam: Constitutional: Alert, well-appearing, no acute distress Ears: External ears without lesions or tenderness. Ear canals with a moderate amount of wax buildup on both sides that was cleaned with suction forceps and curettes.  TMs were clear bilaterally. Nasal: External nose without lesions.  She has small nostrils bilaterally.  She has slight septal deviation to the left.  Mild rhinitis.  Both middle meatus regions are clear.  No signs of infection and no polyps noted. Oral: Oropharynx clear. Neck: No palpable adenopathy or masses Respiratory: Breathing comfortably  Skin: No facial/neck lesions or rash noted.  Cerumen impaction removal  Date/Time: 02/13/2020 5:23 PM Performed by: Rozetta Nunnery, MD Authorized by: Rozetta Nunnery, MD   Consent:    Consent obtained:  Verbal   Consent given by:  Patient   Risks discussed:  Pain and bleeding Procedure details:    Location:  L ear and R ear   Procedure type: suction and forceps   Post-procedure details:    Inspection:  TM intact and canal normal   Hearing quality:  Improved   Patient tolerance of procedure:   Tolerated well, no immediate complications Comments:     TMs are clear bilaterally.    Assessment: Bilateral sensorineural hearing loss slightly asymmetric with the left ear worse than the right Minimal cerumen buildup Septal deviation to the left.  Plan: Ears were cleaned in the office. Recommended obtaining hearing aids in both ears. Gave her some information on use of air max nasal device as she is unable to tolerate the Breathe Right strips.  Radene Journey, MD

## 2020-02-14 ENCOUNTER — Encounter (INDEPENDENT_AMBULATORY_CARE_PROVIDER_SITE_OTHER): Payer: Self-pay

## 2020-02-19 ENCOUNTER — Other Ambulatory Visit: Payer: Self-pay | Admitting: Family Medicine

## 2020-02-27 ENCOUNTER — Ambulatory Visit (INDEPENDENT_AMBULATORY_CARE_PROVIDER_SITE_OTHER): Payer: PPO | Admitting: Family Medicine

## 2020-02-27 DIAGNOSIS — J329 Chronic sinusitis, unspecified: Secondary | ICD-10-CM

## 2020-02-27 DIAGNOSIS — J4521 Mild intermittent asthma with (acute) exacerbation: Secondary | ICD-10-CM | POA: Diagnosis not present

## 2020-02-27 DIAGNOSIS — J310 Chronic rhinitis: Secondary | ICD-10-CM | POA: Diagnosis not present

## 2020-02-27 MED ORDER — ALBUTEROL SULFATE (2.5 MG/3ML) 0.083% IN NEBU: INHALATION_SOLUTION | RESPIRATORY_TRACT | 0 refills | Status: DC

## 2020-02-27 MED ORDER — PREDNISONE 20 MG PO TABS: ORAL_TABLET | ORAL | 0 refills | Status: DC

## 2020-02-27 MED ORDER — AZITHROMYCIN 250 MG PO TABS: ORAL_TABLET | ORAL | 0 refills | Status: DC

## 2020-02-27 NOTE — Progress Notes (Signed)
   Subjective:    Patient ID: [name], female    DOB: 01-18-1956, 64 y.o.   MRN: [id number]  Sore Throat  This is a new problem. Episode onset: 3 days ago  There has been no fever. Associated symptoms include congestion, coughing and headaches. Associated symptoms comments: Cough at night when laying down. Pt states that her chest becomes full at night and in the mornings has a hard time breathing. Pt does have nebulizer and uses that to help. Right ear pressure. Treatments tried: nebulizer, Zyrtec  The treatment provided moderate relief.   Patient states her infection started in her sinus area.  Has moved into the chest.  Ongoing substantial wheezing.  Frontal headache.  Positive nasal discharge.   Review of Systems  HENT: Positive for congestion.   Respiratory: Positive for cough.   Neurological: Positive for headaches.   Virtual Visit via Telephone Note  I connected with [name] on 02/27/20 at  4:10 PM EDT by telephone and verified that I am speaking with the correct person using two identifiers.  Location: Patient: home Provider: office   I discussed the limitations, risks, security and privacy concerns of performing an evaluation and management service by telephone and the availability of in person appointments. I also discussed with the patient that there may be a patient responsible charge related to this service. The patient expressed understanding and agreed to proceed.   History of Present Illness:    Observations/Objective:   Assessment and Plan:   Follow Up Instructions:    I discussed the assessment and treatment plan with the patient. The patient was provided an opportunity to ask questions and all were answered. The patient agreed with the plan and demonstrated an understanding of the instructions.   The patient was advised to call back or seek an in-person evaluation if the symptoms worsen or if the condition fails to improve as  anticipated.  I provided 20 minutes of non-face-to-face time during this encounter.        Objective:   Physical Exam Virtual       Assessment & Plan:  Impression rhinosinusitis/bronchitis with exacerbation of reactive airways.  Now also potential for COVID-19 as a component of this discussed.  Testing strongly encouraged.  Prednisone taper.  Antibiotics prescribed.  Symptom care discussed

## 2020-02-28 ENCOUNTER — Other Ambulatory Visit: Payer: Self-pay

## 2020-02-28 ENCOUNTER — Ambulatory Visit: Payer: PPO | Attending: Internal Medicine

## 2020-02-28 DIAGNOSIS — Z20822 Contact with and (suspected) exposure to covid-19: Secondary | ICD-10-CM

## 2020-02-29 ENCOUNTER — Ambulatory Visit: Payer: Self-pay

## 2020-02-29 LAB — NOVEL CORONAVIRUS, NAA: SARS-CoV-2, NAA: NOT DETECTED

## 2020-02-29 LAB — SARS-COV-2, NAA 2 DAY TAT

## 2020-03-05 DIAGNOSIS — T888XXA Other specified complications of surgical and medical care, not elsewhere classified, initial encounter: Secondary | ICD-10-CM | POA: Diagnosis not present

## 2020-03-05 DIAGNOSIS — L718 Other rosacea: Secondary | ICD-10-CM | POA: Diagnosis not present

## 2020-03-07 ENCOUNTER — Ambulatory Visit: Payer: PPO | Attending: Internal Medicine

## 2020-03-07 DIAGNOSIS — Z23 Encounter for immunization: Secondary | ICD-10-CM

## 2020-03-07 NOTE — Progress Notes (Signed)
   Covid-19 Vaccination Clinic  Name:  MARCY SOOKDEO    MRN: [id number] DOB: February 16, 1956  03/07/2020  Ms. Harpham was observed post Covid-19 immunization for 15 minutes without incident. She was provided with Vaccine Information Sheet and instruction to access the V-Safe system.   Ms. Burling was instructed to call 911 with any severe reactions post vaccine: Marland Kitchen Difficulty breathing  . Swelling of face and throat  . A fast heartbeat  . A bad rash all over body  . Dizziness and weakness   Immunizations Administered    Name Date Dose VIS Date Route   Moderna COVID-19 Vaccine 03/07/2020  8:20 AM 0.5 mL 11/07/2019 Intramuscular   Manufacturer: Moderna   Lot: [id number]   NDC: 94320-037-94

## 2020-03-11 ENCOUNTER — Encounter: Payer: Self-pay | Admitting: Emergency Medicine

## 2020-03-11 ENCOUNTER — Ambulatory Visit (INDEPENDENT_AMBULATORY_CARE_PROVIDER_SITE_OTHER): Payer: PPO

## 2020-03-11 ENCOUNTER — Ambulatory Visit
Admission: EM | Admit: 2020-03-11 | Discharge: 2020-03-11 | Disposition: A | Discharge status: Home / Self Care | Payer: PPO | Attending: Emergency Medicine | Admitting: Emergency Medicine

## 2020-03-11 ENCOUNTER — Other Ambulatory Visit: Payer: Self-pay

## 2020-03-11 DIAGNOSIS — R0602 Shortness of breath: Secondary | ICD-10-CM | POA: Diagnosis not present

## 2020-03-11 MED ORDER — ALBUTEROL SULFATE HFA 108 (90 BASE) MCG/ACT IN AERS: 1.0000 | INHALATION_SPRAY | Freq: Four times a day (QID) | RESPIRATORY_TRACT | 0 refills | Status: DC | PRN

## 2020-03-11 NOTE — ED Triage Notes (Signed)
Pt previously treated for bronchitis with ABX and steroids last week but pt c/o of worsening sob and weakness. Denies n/v/d

## 2020-03-11 NOTE — ED Provider Notes (Addendum)
[facility]    CSN: [id number] Arrival date & time: 03/11/20  0818      History   Chief Complaint Chief Complaint  Patient presents with  . Shortness of Breath    HPI [name] is a 64 y.o. female.   With history of bronchitis  presented to the urgent care with a complaint of shortness of breath and mild dry cough for more than 2 weeks.  Report she was seen by her PCP and was treated for bronchitis.  Azithromycin and  prednisone were prescribed.  Had tested negative for Covid-19  2 weeks ago and received first dose of Moderna  vaccine few days ago.  Denies sick exposure to COVID, flu or strep.  Denies recent travel.  Denies aggravating or alleviating symptoms.  Denies previous COVID infection.   Denies fever, chills, fatigue, nasal congestion, rhinorrhea, sore throat,  wheezing, chest pain, nausea, vomiting, changes in bowel or bladder habits.    The history is provided by the patient. No language interpreter was used.  Shortness of Breath Associated symptoms: cough     Past Medical History:  Diagnosis Date  . Anxiety   . Arthritis   . Depression   . GERD (gastroesophageal reflux disease)   . Hypertension   . Hypothyroid   . Hypothyroidism   . PONV (postoperative nausea and vomiting)     Patient Active Problem List   Diagnosis Date Noted  . Abdominal pain, epigastric 10/16/2019  . Abdominal bloating 10/16/2019  . Prediabetes 12/13/2017  . Hyperlipidemia 12/04/2016  . Bereavement 09/15/2016  . Fever blister 06/24/2016  . Rosacea 03/07/2014  . Chronic low back pain 10/22/2013  . Morbid obesity (HCC) 10/03/2013  . Anxiety 08/23/2013  . Depression 05/15/2013  . Fatigue 05/15/2013  . Urinary incontinence in female 02/23/2013  . GERD (gastroesophageal reflux disease) 09/19/2012  . Intestinal bacterial overgrowth 07/25/2012  . Hypertension 07/25/2012  . Hypothyroidism 07/25/2012  . Constipation 07/25/2012  . CTS (carpal tunnel syndrome) 04/21/2012    . Trigger finger 04/21/2012    Past Surgical History:  Procedure Laterality Date  . BACK SURGERY    . CARPAL TUNNEL RELEASE  06/16/2012   Procedure: CARPAL TUNNEL RELEASE;  Surgeon: Dominica Severin, MD;  Location: [facility];  Service: Orthopedics;  Laterality: Right;  limited open carpal tunnel release  . CERVICAL FUSION  2018  . CHOLECYSTECTOMY    . COLONOSCOPY    . TONSILLECTOMY    . TRIGGER FINGER RELEASE  06/16/2012   Procedure: RELEASE TRIGGER FINGER/A-1 PULLEY;  Surgeon: Dominica Severin, MD;  Location: [facility];  Service: Orthopedics;  Laterality: Right;  right middle a-1 release  . TUBAL LIGATION    . UPPER GASTROINTESTINAL ENDOSCOPY    . VAGINAL HYSTERECTOMY      OB History   No obstetric history on file.      Home Medications    Prior to Admission medications   Medication Sig Start Date End Date Taking? Authorizing Provider  Acetaminophen (TYLENOL ARTHRITIS PAIN PO) Take by mouth as needed.    [provider]  albuterol (PROVENTIL) (2.5 MG/3ML) 0.083% nebulizer solution Use via neb q 4 hrs prn wheezing 02/27/20   Merlyn Albert, MD  albuterol (VENTOLIN HFA) 108 (90 Base) MCG/ACT inhaler Inhale 1-2 puffs into the lungs every 6 (six) hours as needed for wheezing or shortness of breath. 03/11/20   Durward Parcel, FNP  azithromycin (ZITHROMAX) 250 MG tablet Use as directed 02/27/20  Merlyn Albert, MD  gabapentin (NEURONTIN) 100 MG capsule 100 mg. Patient states that she takes as needed for back.    [provider]  ibuprofen (ADVIL) 200 MG tablet Take 200 mg by mouth as needed.    [provider]  levothyroxine (SYNTHROID) 150 MCG tablet Take one and a half tablets on Sunday and one tablet all other days 12/28/19   Merlyn Albert, MD  lisinopril (ZESTRIL) 20 MG tablet TAKE (1) TABLET BY MOUTH ONCE DAILY. 12/28/19   Merlyn Albert, MD  pantoprazole (PROTONIX) 40 MG tablet Take 1 tablet (40 mg total) by  mouth daily before breakfast. 10/16/19   Rehman, Joline Maxcy, MD  predniSONE (DELTASONE) 20 MG tablet Take 3 tablets po for 3 days, then 2 tablets po for 3 days and then one tablet po for 3 days 02/27/20   Merlyn Albert, MD  PREVIDENT 5000 BOOSTER 1.1 % PSTE APPLY THIN RIBBON ANDEBRUSH FOR 2 MINUTES AT BEDTIME. SPIT AFTER BRUSH. 10/06/19   [provider]  sertraline (ZOLOFT) 100 MG tablet Take 1.5 tablets (150 mg total) by mouth daily. 12/28/19   Merlyn Albert, MD  valACYclovir (VALTREX) 1000 MG tablet Take one tid for 7 days 03/21/19   Merlyn Albert, MD    Family History Family History  Problem Relation Age of Onset  . COPD Mother   . Osteoporosis Mother   . COPD Father   . Emphysema Father   . COPD Sister   . Healthy Daughter   . Healthy Son   . Arthritis Other   . Lung disease Other   . Cancer Other   . Asthma Other     Social History Social History   Tobacco Use  . Smoking status: Former Smoker    Packs/day: 1.50    Years: 27.00    Pack years: 40.50    Start date: 1973    Quit date: 2000    Years since quitting: 21.2  . Smokeless tobacco: Never Used  Substance Use Topics  . Alcohol use: No  . Drug use: No     Allergies   Penicillins   Review of Systems Review of Systems  Constitutional: Negative.   HENT: Negative.   Respiratory: Positive for cough and shortness of breath.   Cardiovascular: Negative.   Gastrointestinal: Negative.   Neurological: Negative.   All other systems reviewed and are negative.    Physical Exam Triage Vital Signs ED Triage Vitals  Enc Vitals Group     BP 03/11/20 0826 (!) 155/90     Pulse Rate 03/11/20 0826 80     Resp 03/11/20 0826 (!) 21     Temp 03/11/20 0826 98.5 F (36.9 C)     Temp Source 03/11/20 0826 Oral     SpO2 03/11/20 0826 95 %     Weight 03/11/20 0827 177 lb (80.3 kg)     Height 03/11/20 0827 5\' 2"  (1.575 m)     Head Circumference --      Peak Flow --      Pain Score 03/11/20 0827 0      Pain Loc --      Pain Edu? --      Excl. in GC? --    No data found.  Updated Vital Signs BP (!) 155/90 (BP Location: Right Arm)   Pulse 80   Temp 98.5 F (36.9 C) (Oral)   Resp (!) 21   Ht 5\' 2"  (1.575 m)  Wt 177 lb (80.3 kg)   SpO2 95%   BMI 32.37 kg/m   Visual Acuity Right Eye Distance:   Left Eye Distance:   Bilateral Distance:    Right Eye Near:   Left Eye Near:    Bilateral Near:     Physical Exam Vitals and nursing note reviewed.  Constitutional:      General: She is not in acute distress.    Appearance: Normal appearance. She is well-developed and normal weight. She is not ill-appearing, toxic-appearing or diaphoretic.  HENT:     Head: Normocephalic.     Right Ear: Tympanic membrane, ear canal and external ear normal. There is no impacted cerumen.     Left Ear: Tympanic membrane, ear canal and external ear normal. There is no impacted cerumen.     Nose: Nose normal. No rhinorrhea.     Mouth/Throat:     Mouth: Mucous membranes are moist.     Pharynx: Oropharynx is clear. No oropharyngeal exudate or posterior oropharyngeal erythema.  Cardiovascular:     Rate and Rhythm: Normal rate and regular rhythm.     Pulses: Normal pulses.     Heart sounds: Normal heart sounds. No murmur. No friction rub.  Pulmonary:     Effort: Pulmonary effort is normal. Tachypnea present. No respiratory distress.     Breath sounds: Normal breath sounds. No stridor. No wheezing, rhonchi or rales.  Chest:     Chest wall: No tenderness.  Neurological:     Mental Status: She is alert and oriented to person, place, and time.      UC Treatments / Results  Labs (all labs ordered are listed, but only abnormal results are displayed) Labs Reviewed - No data to display  EKG   Radiology DG Chest 2 View  Result Date: 03/11/2020 CLINICAL DATA:  Shortness of breath EXAM: CHEST - 2 VIEW COMPARISON:  Report of prior chest radiograph July 19, 2017 available. Images from that study  cannot be retrieved at this time. FINDINGS: Lungs are clear. The heart size and pulmonary vascularity are normal. No adenopathy. There is postoperative change in the lower cervical region. IMPRESSION: No edema or airspace opacity. Cardiac silhouette within normal limits. Electronically Signed   By: Lowella Grip III M.D.   On: 03/11/2020 08:48    Procedures Procedures (including critical care time)  Medications Ordered in UC Medications - No data to display  Initial Impression / Assessment and Plan / UC Course  I have reviewed the triage vital signs and the nursing notes.  Pertinent labs & imaging results that were available during my care of the patient were reviewed by me and considered in my medical decision making (see chart for details).     Patient is stable at discharge.  Chest x-ray is negative for acute pulmonary disease.  I have reviewed the x-ray myself and the radiologist interpretation.  I am in agreement with the radiologist interpretation.  Albuterol inhalers was prescribed. Advised to follow-up with PCP to get a referral to a pulmonologist  Complete medication prescribed by PCP. Return or go to ED for worsening symptoms.  Final Clinical Impressions(s) / UC Diagnoses   Final diagnoses:  Shortness of breath     Discharge Instructions     Your chest x-ray is negative for acute pulmonary disease. Advise  patient to complete medication prescribed by PCP Continue to take Flonase, albuterol, and Zyrtec as prescribed Albuterol inhaler was prescribed at this visit. Follow-up with PCP for possible referral to  a pulmonologist due to recurrent bronchitis. Return or go to ED for worsening of symptoms    ED Prescriptions    Medication Sig Dispense Auth. Provider   albuterol (VENTOLIN HFA) 108 (90 Base) MCG/ACT inhaler Inhale 1-2 puffs into the lungs every 6 (six) hours as needed for wheezing or shortness of breath. 18 g Durward Parcel, FNP     PDMP not  reviewed this encounter.       Durward Parcel, FNP 03/11/20 [phone]

## 2020-03-11 NOTE — Discharge Instructions (Addendum)
Your chest x-ray is negative for acute pulmonary disease. Advise  patient to complete medication prescribed by PCP Continue to take Flonase, albuterol, and Zyrtec as prescribed Albuterol inhaler was prescribed at this visit. Follow-up with PCP for possible referral to a pulmonologist due to recurrent bronchitis. Return or go to ED for worsening of symptoms

## 2020-03-12 ENCOUNTER — Ambulatory Visit (INDEPENDENT_AMBULATORY_CARE_PROVIDER_SITE_OTHER): Payer: PPO | Admitting: Family Medicine

## 2020-03-12 DIAGNOSIS — R05 Cough: Secondary | ICD-10-CM

## 2020-03-12 DIAGNOSIS — J45909 Unspecified asthma, uncomplicated: Secondary | ICD-10-CM

## 2020-03-12 DIAGNOSIS — T7840XA Allergy, unspecified, initial encounter: Secondary | ICD-10-CM

## 2020-03-12 NOTE — Progress Notes (Signed)
   Subjective:  Audio only  Patient ID: [name], female    DOB: 04/01/1956, 64 y.o.   MRN: [id number]  HPI Patient calls in today for urgent care follow up for SOB and cough. Patient states she completed the prednisone and abx and started to feel worse again.  Patient went back to the urgent care yesterday and was told the chest x rays were negative and they told her it may be allergies and gave her an albuterol inhaler.    Virtual Visit via Video Note  I connected with [name] on 03/12/20 at 11:00 AM EDT by a video enabled telemedicine application and verified that I am speaking with the correct person using two identifiers.  Location: Patient: home Provider: office   I discussed the limitations of evaluation and management by telemedicine and the availability of in person appointments. The patient expressed understanding and agreed to proceed.  History of Present Illness:    Observations/Objective:   Assessment and Plan:   Follow Up Instructions:    I discussed the assessment and treatment plan with the patient. The patient was provided an opportunity to ask questions and all were answered. The patient agreed with the plan and demonstrated an understanding of the instructions.   The patient was advised to call back or seek an in-person evaluation if the symptoms worsen or if the condition fails to improve as anticipated.  I provided 20 minutes of non-face-to-face time during this encounter.   Patient frustrated by recurrent challenges with breathing. She did smoke for greater than 20 years.  Also gets substantial allergies. Worse in the spring. Seems to flareup this year.  Has had multiple exacerbations of reactive airways within the last half year.    Review of Systems No headache no chest pain. Shortness of breath improved today    Objective:   Physical Exam   Virtual    Assessment & Plan:  Impression recurrent bronchitis/shortness of  breath/reactive airways. Patient saw urgent care doctor who strongly encouraged specialty evaluation from an allergy and pulmonary standpoint. I advised the patient her best choice would be an asthma allergy specialist such as Dr. Dellis Anes. We will work on this referral. She likely would benefit from a trial of a steroid inhaler but will await Dr. Ellouise Newer analysis and recommendations

## 2020-03-14 ENCOUNTER — Encounter: Payer: Self-pay | Admitting: Family Medicine

## 2020-03-25 ENCOUNTER — Other Ambulatory Visit: Payer: Self-pay | Admitting: Family Medicine

## 2020-04-09 ENCOUNTER — Ambulatory Visit: Payer: PPO | Attending: Internal Medicine

## 2020-04-09 DIAGNOSIS — Z23 Encounter for immunization: Secondary | ICD-10-CM

## 2020-04-09 NOTE — Progress Notes (Signed)
   Covid-19 Vaccination Clinic  Name:  TAUNYA GORAL    MRN: [id number] DOB: [birth year]  04/09/2020  Ms. Scheid was observed post Covid-19 immunization for 15 minutes without incident. She was provided with Vaccine Information Sheet and instruction to access the V-Safe system.   Ms. Sloss was instructed to call 911 with any severe reactions post vaccine: Marland Kitchen Difficulty breathing  . Swelling of face and throat  . A fast heartbeat  . A bad rash all over body  . Dizziness and weakness   Immunizations Administered    Name Date Dose VIS Date Route   Moderna COVID-19 Vaccine 04/09/2020 12:15 PM 0.5 mL 11/2019 Intramuscular   Manufacturer: Moderna   Lot: [id number]   NDC: 95583-167-42

## 2020-04-12 ENCOUNTER — Ambulatory Visit: Payer: PPO | Admitting: Allergy & Immunology

## 2020-04-12 ENCOUNTER — Encounter: Payer: Self-pay | Admitting: Allergy & Immunology

## 2020-04-12 ENCOUNTER — Other Ambulatory Visit: Payer: Self-pay

## 2020-04-12 VITALS — BP 112/70 | HR 78 | Temp 98.3°F | Resp 18 | Ht 61.0 in | Wt 177.0 lb

## 2020-04-12 DIAGNOSIS — J3089 Other allergic rhinitis: Secondary | ICD-10-CM | POA: Diagnosis not present

## 2020-04-12 DIAGNOSIS — J4540 Moderate persistent asthma, uncomplicated: Secondary | ICD-10-CM

## 2020-04-12 DIAGNOSIS — J302 Other seasonal allergic rhinitis: Secondary | ICD-10-CM

## 2020-04-12 MED ORDER — FLUTICASONE PROPIONATE 50 MCG/ACT NA SUSP: 2.0000 | Freq: Every day | NASAL | 5 refills | Status: DC

## 2020-04-12 MED ORDER — BUDESONIDE-FORMOTEROL FUMARATE 80-4.5 MCG/ACT IN AERO: 2.0000 | INHALATION_SPRAY | Freq: Two times a day (BID) | RESPIRATORY_TRACT | 5 refills | Status: DC

## 2020-04-12 NOTE — Patient Instructions (Addendum)
1. Moderate persistent asthma, uncomplicated - Lung testing looked fairly normal and one of the values did increase with the Xopenex treatment. - Because of the symptoms you are experiencing, we are going to start a controller medication that contains a long acting albuterol and an inhaled steroid. - Spacer sample and demonstration provided. - Daily controller medication(s): Symbicort 80/4.41mcg two puffs twice daily with spacer - Prior to physical activity: albuterol 2 puffs 10-15 minutes before physical activity. - Rescue medications: albuterol 4 puffs every 4-6 hours as needed - Asthma control goals:  * Full participation in all desired activities (may need albuterol before activity) * Albuterol use two time or less a week on average (not counting use with activity) * Cough interfering with sleep two time or less a month * Oral steroids no more than once a year * No hospitalizations  2. Seasonal and perennial allergic rhinitis - Testing today showed: ragweed, weeds, trees and dust mites (but these were fairly small) - Copy of test results provided.  - Avoidance measures provided. - Continue with: Zyrtec (cetirizine) 10mg  tablet once daily AS NEEDED and Flonase (fluticasone) two sprays per nostril daily - You can use an extra dose of the antihistamine, if needed, for breakthrough symptoms.  - Consider nasal saline rinses 1-2 times daily to remove allergens from the nasal cavities as well as help with mucous clearance (this is especially helpful to do before the nasal sprays are given) - Consider allergy shots as a means of long-term control. - Allergy shots "re-train" and "reset" the immune system to ignore environmental allergens and decrease the resulting immune response to those allergens (sneezing, itchy watery eyes, runny nose, nasal congestion, etc).    - Allergy shots improve symptoms in 75-85% of patients.  - We can discuss more at the next appointment if the medications are not  working for you.  3. Return in about 6 weeks (around 05/24/2020). This can be an in-person, a virtual Webex or a telephone follow up visit.   Please inform 05/26/2020 of any Emergency Department visits, hospitalizations, or changes in symptoms. Call us before going to the ED for breathing or allergy symptoms since we might be able to fit you in for a sick visit. Feel free to contact us anytime with any questions, problems, or concerns.  It was a pleasure to meet you today!  Websites that have reliable patient information: 1. American Academy of Asthma, Allergy, and Immunology: www.aaaai.org 2. Food Allergy Research and Education (FARE): foodallergy.org 3. Mothers of Asthmatics: http://www.asthmacommunitynetwork.org 4. American College of Allergy, Asthma, and Immunology: www.acaai.org   COVID-19 Vaccine Information can be found at: [name] For questions related to vaccine distribution or appointments, please email [email or web address] or call [phone].     "Like" [phone] on Facebook and Instagram for our latest updates!       HAPPY SPRING!  Make sure you are registered to vote! If you have moved or changed any of your contact information, you will need to get this updated before voting!  In some cases, you MAY be able to register to vote online: [name]    Reducing Pollen Exposure  The American Academy of Allergy, Asthma and Immunology suggests the following steps to reduce your exposure to pollen during allergy seasons.    1. Do not hang sheets or clothing out to dry; pollen may collect on these items. 2. Do not mow lawns or spend time around freshly cut grass; mowing stirs up pollen. 3. Keep windows closed at night.  Keep car windows closed while driving. 4. Minimize morning activities outdoors, a time when pollen counts are usually at their highest. 5. Stay indoors as much  as possible when pollen counts or humidity is high and on windy days when pollen tends to remain in the air longer. 6. Use air conditioning when possible.  Many air conditioners have filters that trap the pollen spores. 7. Use a HEPA room air filter to remove pollen form the indoor air you breathe.  Control of Dust Mite Allergen    Dust mites play a major role in allergic asthma and rhinitis.  They occur in environments with high humidity wherever human skin is found.  Dust mites absorb humidity from the atmosphere (ie, they do not drink) and feed on organic matter (including shed human and animal skin).  Dust mites are a microscopic type of insect that you cannot see with the naked eye.  High levels of dust mites have been detected from mattresses, pillows, carpets, upholstered furniture, bed covers, clothes, soft toys and any woven material.  The principal allergen of the dust mite is found in its feces.  A gram of dust may contain 1,000 mites and 250,000 fecal particles.  Mite antigen is easily measured in the air during house cleaning activities.  Dust mites do not bite and do not cause harm to humans, other than by triggering allergies/asthma.    Ways to decrease your exposure to dust mites in your home:  1. Encase mattresses, box springs and pillows with a mite-impermeable barrier or cover   2. Wash sheets, blankets and drapes weekly in hot water (130 F) with detergent and dry them in a dryer on the hot setting.  3. Have the room cleaned frequently with a vacuum cleaner and a damp dust-mop.  For carpeting or rugs, vacuuming with a vacuum cleaner equipped with a high-efficiency particulate air (HEPA) filter.  The dust mite allergic individual should not be in a room which is being cleaned and should wait 1 hour after cleaning before going into the room. 4. Do not sleep on upholstered furniture (eg, couches).   5. If possible removing carpeting, upholstered furniture and drapery from the home is  ideal.  Horizontal blinds should be eliminated in the rooms where the person spends the most time (bedroom, study, television room).  Washable vinyl, roller-type shades are optimal. 6. Remove all non-washable stuffed toys from the bedroom.  Wash stuffed toys weekly like sheets and blankets above.   7. Reduce indoor humidity to less than 50%.  Inexpensive humidity monitors can be purchased at most hardware stores.  Do not use a humidifier as can make the problem worse and are not recommended.

## 2020-04-12 NOTE — Progress Notes (Signed)
NEW PATIENT  Date of Service/Encounter:  04/12/20  Referring provider: Merlyn Albert, MD   Assessment:   Moderate persistent asthma, uncomplicated  Seasonal and perennial allergic rhinitis (ragweed, weeds, trees and dust mites)   Ms. Sandiford presents for an evaluation of recurrent bronchitis. With the regularity of these episodes, this could certainly be allergic asthma. Her skin testing today could support this with both spring and fall allergens. However, her testing was not all that impressive reactivity wise and her spirometry was not very improved with the use of the bronchodilator. We are going to start a combined ICS/LABA to see if this will help her symptoms and see her back in a few weeks. We are also going to aggressively treat her allergic rhinitis to see if this helps her clinical status as well.   Plan/Recommendations:   1. Moderate persistent asthma, uncomplicated - Lung testing looked fairly normal and one of the values did increase with the Xopenex treatment. - Because of the symptoms you are experiencing, we are going to start a controller medication that contains a long acting albuterol and an inhaled steroid. - Spacer sample and demonstration provided. - Daily controller medication(s): Symbicort 80/4.55mcg two puffs twice daily with spacer - Prior to physical activity: albuterol 2 puffs 10-15 minutes before physical activity. - Rescue medications: albuterol 4 puffs every 4-6 hours as needed - Asthma control goals:  * Full participation in all desired activities (may need albuterol before activity) * Albuterol use two time or less a week on average (not counting use with activity) * Cough interfering with sleep two time or less a month * Oral steroids no more than once a year * No hospitalizations  2. Seasonal and perennial allergic rhinitis - Testing today showed: ragweed, weeds, trees and dust mites (but these were fairly small) - Copy of test results  provided.  - Avoidance measures provided. - Continue with: Zyrtec (cetirizine) 10mg  tablet once daily AS NEEDED and Flonase (fluticasone) two sprays per nostril daily - You can use an extra dose of the antihistamine, if needed, for breakthrough symptoms.  - Consider nasal saline rinses 1-2 times daily to remove allergens from the nasal cavities as well as help with mucous clearance (this is especially helpful to do before the nasal sprays are given) - Consider allergy shots as a means of long-term control. - Allergy shots "re-train" and "reset" the immune system to ignore environmental allergens and decrease the resulting immune response to those allergens (sneezing, itchy watery eyes, runny nose, nasal congestion, etc).    - Allergy shots improve symptoms in 75-85% of patients.  - We can discuss more at the next appointment if the medications are not working for you.  3. Return in about 6 weeks (around 05/24/2020). This can be an in-person, a virtual Webex or a telephone follow up visit.   Subjective:   [name] is a 64 y.o. female presenting today for evaluation of  Chief Complaint  Patient presents with  . Bronchitis    bronchitis every year for many years. she does say that she has it 2 times a year at times. has a significant family history of respiratory issues. she complains of shortness of breath and wheezing. her shortness of breath is worse with exertion and when it is hot.     [name] has a history of the following: Patient Active Problem List   Diagnosis Date Noted  . Abdominal pain, epigastric 10/16/2019  . Abdominal bloating 10/16/2019  .  Prediabetes 12/13/2017  . Hyperlipidemia 12/04/2016  . Bereavement 09/15/2016  . Fever blister 06/24/2016  . Rosacea 03/07/2014  . Chronic low back pain 10/22/2013  . Morbid obesity (HCC) 10/03/2013  . Anxiety 08/23/2013  . Depression 05/15/2013  . Fatigue 05/15/2013  . Urinary incontinence in female 02/23/2013  . GERD  (gastroesophageal reflux disease) 09/19/2012  . Intestinal bacterial overgrowth 07/25/2012  . Hypertension 07/25/2012  . Hypothyroidism 07/25/2012  . Constipation 07/25/2012  . CTS (carpal tunnel syndrome) 04/21/2012  . Trigger finger 04/21/2012    History obtained from: chart review and patient.  [name] was referred by Merlyn Albert, MD.     [name] is a 64 y.o. female presenting for an evaluation of allergies and asthma.   Asthma/Respiratory Symptom History: She reports that she has had bronchitis for years. She typically gets it in the fall and the spring. She was treating it as allergies, but it has started to get even worse and more frequent. Recently she had a CXR that was completely negative with regards to her lungs being clear and whatnot. She was having wheezing at the time. She estimates that she gets bronchitis 1-2 times per year with prednisone courses. She tells me that she has problems with walking to her mailbox or making the bed or any physicial activity, she has shortness of breath and difficult completing the task. She did smoke heavily but quit 20 years ago.  Everyone in her family "died of COPD".   Allergic Rhinitis Symptom History: She did go to see Dr. Ezzard Standing at one point and was diagnosed with a "slightly deviated septum". She has never been allergy tested. She knows that the pollen season is bad and she is getting worse as she gets older. She does breathe through her mouth and has a dry mouth every morning. She has never had a sleep study but she does snore at night. She has used fluticasone in the past and she takes cetirizine at night. She has tried Xyzal but this did not do anything except "knock" her out.   Otherwise, there is no history of other atopic diseases, including food allergies, drug allergies, stinging insect allergies, eczema, urticaria or contact dermatitis. There is no significant infectious history. Vaccinations are up to date.    Past  Medical History: Patient Active Problem List   Diagnosis Date Noted  . Abdominal pain, epigastric 10/16/2019  . Abdominal bloating 10/16/2019  . Prediabetes 12/13/2017  . Hyperlipidemia 12/04/2016  . Bereavement 09/15/2016  . Fever blister 06/24/2016  . Rosacea 03/07/2014  . Chronic low back pain 10/22/2013  . Morbid obesity (HCC) 10/03/2013  . Anxiety 08/23/2013  . Depression 05/15/2013  . Fatigue 05/15/2013  . Urinary incontinence in female 02/23/2013  . GERD (gastroesophageal reflux disease) 09/19/2012  . Intestinal bacterial overgrowth 07/25/2012  . Hypertension 07/25/2012  . Hypothyroidism 07/25/2012  . Constipation 07/25/2012  . CTS (carpal tunnel syndrome) 04/21/2012  . Trigger finger 04/21/2012    Medication List:  Allergies as of 04/12/2020      Reactions   Penicillins Hives      Medication List       Accurate as of Apr 12, 2020  4:25 PM. If you have any questions, ask your nurse or doctor.        albuterol (2.5 MG/3ML) 0.083% nebulizer solution Commonly known as: PROVENTIL Use via neb q 4 hrs prn wheezing   albuterol 108 (90 Base) MCG/ACT inhaler Commonly known as: VENTOLIN HFA Inhale 1-2 puffs into  the lungs every 6 (six) hours as needed for wheezing or shortness of breath.   budesonide-formoterol 80-4.5 MCG/ACT inhaler Commonly known as: Symbicort Inhale 2 puffs into the lungs 2 (two) times daily. Started by: Valentina Shaggy, MD   gabapentin 100 MG capsule Commonly known as: NEURONTIN 100 mg. Patient states that she takes as needed for back.   ibuprofen 200 MG tablet Commonly known as: ADVIL Take 200 mg by mouth as needed.   levothyroxine 150 MCG tablet Commonly known as: SYNTHROID Take one and a half tablets on Sunday and one tablet all other days   lisinopril 20 MG tablet Commonly known as: ZESTRIL TAKE (1) TABLET BY MOUTH ONCE DAILY.   meloxicam 7.5 MG tablet Commonly known as: MOBIC Take 7.5 mg by mouth daily.   pantoprazole 40  MG tablet Commonly known as: PROTONIX Take 1 tablet (40 mg total) by mouth daily before breakfast.   PreviDent 5000 Booster 1.1 % Pste Generic drug: Sodium Fluoride APPLY THIN RIBBON ANDEBRUSH FOR 2 MINUTES AT BEDTIME. SPIT AFTER BRUSH.   sertraline 100 MG tablet Commonly known as: ZOLOFT Take 1.5 tablets (150 mg total) by mouth daily.   valACYclovir 1000 MG tablet Commonly known as: VALTREX TAKE 1 TABLET BY MOUTH THREE TIMES DAILY FOR 7 DAYS.       Birth History: non-contributory  Developmental History: non-contributory  Past Surgical History: Past Surgical History:  Procedure Laterality Date  . BACK SURGERY    . CARPAL TUNNEL RELEASE  06/16/2012   Procedure: CARPAL TUNNEL RELEASE;  Surgeon: Roseanne Kaufman, MD;  Location: [facility];  Service: Orthopedics;  Laterality: Right;  limited open carpal tunnel release  . CERVICAL FUSION  2018  . CHOLECYSTECTOMY    . COLONOSCOPY    . TONSILLECTOMY    . TRIGGER FINGER RELEASE  06/16/2012   Procedure: RELEASE TRIGGER FINGER/A-1 PULLEY;  Surgeon: Roseanne Kaufman, MD;  Location: [facility];  Service: Orthopedics;  Laterality: Right;  right middle a-1 release  . TUBAL LIGATION    . UPPER GASTROINTESTINAL ENDOSCOPY    . VAGINAL HYSTERECTOMY       Family History: Family History  Problem Relation Age of Onset  . COPD Mother   . Osteoporosis Mother   . Immunodeficiency Mother   . COPD Father   . Emphysema Father   . Tuberculosis Father   . Lung disease Father   . COPD Sister   . Healthy Daughter   . Healthy Son   . Arthritis Other   . Lung disease Other   . Cancer Other   . Asthma Other   . Eczema Neg Hx   . Atopy Neg Hx   . Angioedema Neg Hx   . Allergic rhinitis Neg Hx      Social History: Cova lives at home with her family.  Juliann Pulse lives in a house that is 64 years old.  There is no mold or mildew damage in the home.  She has hardwood throughout the home.  She has electric heating and  central cooling.  There are 2 cats in the home.  There are no dust mite coverings on the bedding.  There is no tobacco exposure, but she did smoke for several years, likely around [age].  She is retired.  She is not exposed to fumes or chemicals.  She does have a HEPA filter in the home.  She does not live near an interstate or industrial area.   Review of Systems  Constitutional:  Negative.  Negative for fever, malaise/fatigue and weight loss.  HENT: Negative.  Negative for congestion, ear discharge and ear pain.   Eyes: Negative for pain, discharge and redness.  Respiratory: Positive for cough and shortness of breath. Negative for sputum production and wheezing.   Cardiovascular: Negative.  Negative for chest pain and palpitations.  Gastrointestinal: Negative for abdominal pain, constipation, diarrhea, heartburn, nausea and vomiting.  Skin: Negative.  Negative for itching and rash.  Neurological: Negative for dizziness and headaches.  Endo/Heme/Allergies: Negative for environmental allergies. Does not bruise/bleed easily.       Objective:   Blood pressure 112/70, pulse 78, temperature 98.3 F (36.8 C), temperature source Temporal, resp. rate 18, height  (1.549 m), weight 177 lb (80.3 kg), SpO2 94 %. Body mass index is 33.44 kg/m.   Physical Exam:   Physical Exam  Constitutional: She appears well-developed.  Plea  HENT:  Head: Normocephalic and atraumatic.  Right Ear: Tympanic membrane, external ear and ear canal normal. No drainage, swelling or tenderness. Tympanic membrane is not injected, not scarred, not erythematous, not retracted and not bulging.  Left Ear: Tympanic membrane, external ear and ear canal normal. No drainage, swelling or tenderness. Tympanic membrane is not injected, not scarred, not erythematous, not retracted and not bulging.  Nose: Mucosal edema and rhinorrhea present. No nasal deformity or septal deviation. No epistaxis. Right sinus exhibits no  maxillary sinus tenderness and no frontal sinus tenderness. Left sinus exhibits no maxillary sinus tenderness and no frontal sinus tenderness.  Mouth/Throat: Uvula is midline and oropharynx is clear and moist. Mucous membranes are not pale and not dry.  Eyes: Pupils are equal, round, and reactive to light. Conjunctivae and EOM are normal. Right eye exhibits no chemosis and no discharge. Left eye exhibits no chemosis and no discharge. Right conjunctiva is not injected. Left conjunctiva is not injected.  Cardiovascular: Normal rate, regular rhythm and normal heart sounds.  Respiratory: Effort normal and breath sounds normal. No accessory muscle usage. No tachypnea. No respiratory distress. She has no wheezes. She has no rhonchi. She has no rales. She exhibits no tenderness.  No increased work of breathing. No crackles or wheezes noted. Moving air well in all lung fields.   GI: There is no abdominal tenderness. There is no rebound and no guarding.  Lymphadenopathy:       Head (right side): No submandibular, no tonsillar and no occipital adenopathy present.       Head (left side): No submandibular, no tonsillar and no occipital adenopathy present.    She has no cervical adenopathy.  Neurological: She is alert.  Skin: No abrasion, no petechiae and no rash noted. Rash is not papular, not vesicular and not urticarial. No erythema. No pallor.  No eczematous or urticarial lesions noted.   Psychiatric: She has a normal mood and affect.     Diagnostic studies:    Spirometry: results normal (FEV1: 1.76/80%, FVC: 2.22/77%, FEV1/FVC: 79%).    Spirometry consistent with normal pattern. Albuterol four puffs via MDI treatment given in clinic with improvement in FVC, but not significant per ATS criteria.  Allergy Studies:    Airborne Adult Perc - 04/12/20 1418    Time Antigen Placed  1418    Allergen Manufacturer  Waynette Buttery    Location  Back    Number of Test  59    Panel 1  Select    1. Control-Buffer 50%  Glycerol  Negative    2. Control-Histamine 1 mg/ml  2+  3. Albumin saline  Negative    4. Bahia  Negative    5. [place]  Negative    6. Johnson  Negative    7. Kentucky Blue  Negative    8. Meadow Fescue  Negative    9. Perennial Rye  Negative    10. Sweet Vernal  Negative    11. Timothy  Negative    12. Cocklebur  Negative    13. Burweed Marshelder  Negative    14. Ragweed, short  Negative    15. Ragweed, Giant  Negative    16. Plantain,  English  Negative    17. Lamb's Quarters  Negative    18. Sheep Sorrell  Negative    19. Rough Pigweed  Negative    20. Marsh Elder, Rough  Negative    21. Mugwort, Common  Negative    22. Ash mix  Negative    23. Birch mix  Negative    24. Beech American  Negative    25. Box, Elder  Negative    26. Cedar, red  Negative    27. Cottonwood, Guinea[place]Biss[place]astern  Negative    28. Elm mix  Negative    29. Hickory  Negative    30. Maple mix  Negative    31. Oak, Guinea[place]iss[place]astern mix  Negative    32. Pecan Pollen  Negative    33. Pine mix  Negative    34. Sycamore Eastern  Negative    35. Walnut, Black Pollen  Negative    36. Alternaria alternata  Negative    37. Cladosporium Herbarum  Negative    38. Aspergillus mix  Negative    39. Penicillium mix  Negative    40. Bipolaris sorokiniana (Helminthosporium)  Negative    41. Drechslera spicifera (Curvularia)  Negative    42. Mucor plumbeus  Negative    43. Fusarium moniliforme  Negative    44. Aureobasidium pullulans (pullulara)  Negative    45. Rhizopus oryzae  Negative    46. Botrytis cinera  Negative    47. Epicoccum nigrum  Negative    48. Phoma betae  Negative    49. Candida Albicans  Negative    50. Trichophyton mentagrophytes  Negative    51. Mite, D Farinae  5,000 AU/ml  Negative    52. Mite, D Pteronyssinus  5,000 AU/ml  Negative    53. Cat Hair 10,000 BAU/ml  Negative    54.  Dog Epithelia  Negative    55. Mixed Feathers  Negative    56. Horse Epithelia  Negative    57. Cockroach, German   Negative    58. Mouse  Negative    59. Tobacco Leaf  Negative     Intradermal - 04/12/20 1443    Time Antigen Placed  1443    Allergen Manufacturer  Waynette ButteryGreer    Location  Back    Number of Test  15    Intradermal  Select    Control  Negative    [place]  Negative    Johnson  Negative    7 Grass  Negative    Ragweed mix  1+    Weed mix  1+    Tree mix  1+    Mold 1  Negative    Mold 2  Negative    Mold 3  Negative    Mold 4  Negative    Cat  Negative    Dog  Negative    Cockroach  Negative  Mite mix  1+       Allergy testing results were read and interpreted by myself, documented by clinical staff.         Malachi Bonds, MD [facility]

## 2020-04-18 DIAGNOSIS — L718 Other rosacea: Secondary | ICD-10-CM | POA: Diagnosis not present

## 2020-04-18 DIAGNOSIS — L538 Other specified erythematous conditions: Secondary | ICD-10-CM | POA: Diagnosis not present

## 2020-04-18 DIAGNOSIS — R208 Other disturbances of skin sensation: Secondary | ICD-10-CM | POA: Diagnosis not present

## 2020-04-18 DIAGNOSIS — L570 Actinic keratosis: Secondary | ICD-10-CM | POA: Diagnosis not present

## 2020-04-18 DIAGNOSIS — Z789 Other specified health status: Secondary | ICD-10-CM | POA: Diagnosis not present

## 2020-04-18 DIAGNOSIS — L298 Other pruritus: Secondary | ICD-10-CM | POA: Diagnosis not present

## 2020-04-18 DIAGNOSIS — R58 Hemorrhage, not elsewhere classified: Secondary | ICD-10-CM | POA: Diagnosis not present

## 2020-04-18 DIAGNOSIS — L820 Inflamed seborrheic keratosis: Secondary | ICD-10-CM | POA: Diagnosis not present

## 2020-05-03 ENCOUNTER — Telehealth: Payer: Self-pay | Admitting: Family Medicine

## 2020-05-03 DIAGNOSIS — E039 Hypothyroidism, unspecified: Secondary | ICD-10-CM

## 2020-05-03 DIAGNOSIS — I10 Essential (primary) hypertension: Secondary | ICD-10-CM

## 2020-05-03 NOTE — Telephone Encounter (Signed)
Orders put in and pt was notified.  

## 2020-05-03 NOTE — Telephone Encounter (Signed)
Pt has follow up 6/22 and would like to have lab work done.

## 2020-05-03 NOTE — Telephone Encounter (Signed)
Last labs 1/21 crp, ana, rf, sed rate

## 2020-05-03 NOTE — Telephone Encounter (Signed)
Pls order tsh, cmp.   Thx.   Dr. Ladona Ridgel

## 2020-05-08 DIAGNOSIS — E039 Hypothyroidism, unspecified: Secondary | ICD-10-CM | POA: Diagnosis not present

## 2020-05-08 DIAGNOSIS — I10 Essential (primary) hypertension: Secondary | ICD-10-CM | POA: Diagnosis not present

## 2020-05-09 LAB — COMPREHENSIVE METABOLIC PANEL
ALT: 23 IU/L (ref 0–32)
AST: 18 IU/L (ref 0–40)
Albumin/Globulin Ratio: 1.5 (ref 1.2–2.2)
Albumin: 4.4 g/dL (ref 3.8–4.8)
Alkaline Phosphatase: 76 IU/L (ref 48–121)
BUN/Creatinine Ratio: 17 (ref 12–28)
BUN: 15 mg/dL (ref 8–27)
Bilirubin Total: <0.2 mg/dL (ref 0.0–1.2)
CO2: 23 mmol/L (ref 20–29)
Calcium: 9.6 mg/dL (ref 8.7–10.3)
Chloride: 104 mmol/L (ref 96–106)
Creatinine, Ser: 0.89 mg/dL (ref 0.57–1.00)
GFR calc Af Amer: 80 mL/min/{1.73_m2} (ref >59)
GFR calc non Af Amer: 69 mL/min/{1.73_m2} (ref >59)
Globulin, Total: 3 g/dL (ref 1.5–4.5)
Glucose: 113 mg/dL — ABNORMAL HIGH (ref 65–99)
Potassium: 4.8 mmol/L (ref 3.5–5.2)
Sodium: 139 mmol/L (ref 134–144)
Total Protein: 7.4 g/dL (ref 6.0–8.5)

## 2020-05-09 LAB — TSH: TSH: 0.752 u[IU]/mL (ref 0.450–4.500)

## 2020-05-13 ENCOUNTER — Other Ambulatory Visit: Payer: Self-pay

## 2020-05-13 ENCOUNTER — Ambulatory Visit (INDEPENDENT_AMBULATORY_CARE_PROVIDER_SITE_OTHER): Payer: PPO | Admitting: Family Medicine

## 2020-05-13 ENCOUNTER — Encounter: Payer: Self-pay | Admitting: Family Medicine

## 2020-05-13 DIAGNOSIS — F419 Anxiety disorder, unspecified: Secondary | ICD-10-CM | POA: Diagnosis not present

## 2020-05-13 DIAGNOSIS — F321 Major depressive disorder, single episode, moderate: Secondary | ICD-10-CM | POA: Diagnosis not present

## 2020-05-13 MED ORDER — SERTRALINE HCL 100 MG PO TABS: 200.0000 mg | ORAL_TABLET | Freq: Every day | ORAL | 0 refills | Status: DC

## 2020-05-13 NOTE — Progress Notes (Signed)
Patient ID: [name], female    DOB: [birth year], 64 y.o.   MRN: [id number]   Chief Complaint  Patient presents with  . Hypothyroidism   Subjective:    HPI  CC-Hypothyroidism, decreased energy, fatigue, weight gain.  Would like to discuss thyroid and weight. Would like to go back on phentermine.  Pt stating not happy with weight. Gaining weight since thyroid dx years ago. Pt on and taking 1 and 1/2 on sundays and 1 tab daily on other days. Pt wanting help with weight. Was on phentermine and wanting to restarting. Doing some walking daily.  No previous side effects when on this medication.  Has elevated HR today at 109. Feeling like sleeping a lot 14 hrs per day.  Pt denies chest pain, palpitations, diarrhea, sweats, constipation, coarse skin, hair loss.  + weight gain and fatigue.  Depression/anxiety- has had this many years, stable.  Taking zolfot 150mg  for a while.  Medical History Jacee has a past medical history of Anxiety, Arthritis, Depression, GERD (gastroesophageal reflux disease), Hypertension, Hypothyroid, Hypothyroidism, PONV (postoperative nausea and vomiting), Recurrent upper respiratory infection (URI), and Urticaria.   Outpatient Encounter Medications as of 05/13/2020  Medication Sig  . albuterol (PROVENTIL) (2.5 MG/3ML) 0.083% nebulizer solution Use via neb q 4 hrs prn wheezing  . albuterol (VENTOLIN HFA) 108 (90 Base) MCG/ACT inhaler Inhale 1-2 puffs into the lungs every 6 (six) hours as needed for wheezing or shortness of breath.  . budesonide-formoterol (SYMBICORT) 80-4.5 MCG/ACT inhaler Inhale 2 puffs into the lungs 2 (two) times daily.  07/13/2020 doxycycline (MONODOX) 100 MG capsule Take 100 mg by mouth daily.  . fluticasone (FLONASE) 50 MCG/ACT nasal spray Place 2 sprays into both nostrils daily.  Marland Kitchen gabapentin (NEURONTIN) 100 MG capsule 100 mg. Patient states that she takes as needed for back.  Marland Kitchen ibuprofen (ADVIL) 200 MG tablet Take 200 mg by mouth as  needed.  Marland Kitchen levothyroxine (SYNTHROID) 150 MCG tablet Take one and a half tablets on Sunday and one tablet all other days  . lisinopril (ZESTRIL) 20 MG tablet TAKE (1) TABLET BY MOUTH ONCE DAILY.  . meloxicam (MOBIC) 7.5 MG tablet Take 7.5 mg by mouth daily.  . pantoprazole (PROTONIX) 40 MG tablet Take 1 tablet (40 mg total) by mouth daily before breakfast.  . PREVIDENT 5000 BOOSTER 1.1 % PSTE APPLY THIN RIBBON ANDEBRUSH FOR 2 MINUTES AT BEDTIME. SPIT AFTER BRUSH.  . valACYclovir (VALTREX) 1000 MG tablet TAKE 1 TABLET BY MOUTH THREE TIMES DAILY FOR 7 DAYS.  . [DISCONTINUED] sertraline (ZOLOFT) 100 MG tablet Take 1.5 tablets (150 mg total) by mouth daily.  . sertraline (ZOLOFT) 100 MG tablet Take 2 tablets (200 mg total) by mouth daily.   No facility-administered encounter medications on file as of 05/13/2020.     Review of Systems  Constitutional: Positive for activity change and fatigue. Negative for appetite change, chills and fever.  HENT: Negative for congestion, rhinorrhea and sore throat.   Respiratory: Negative for cough, shortness of breath and wheezing.   Cardiovascular: Negative for chest pain and leg swelling.  Gastrointestinal: Negative for abdominal pain, diarrhea, nausea and vomiting.  Genitourinary: Negative for dysuria and frequency.  Musculoskeletal: Negative for arthralgias and back pain.  Skin: Negative for rash.  Neurological: Negative for dizziness, weakness and headaches.  Psychiatric/Behavioral: Positive for dysphoric mood. Negative for self-injury, sleep disturbance and suicidal ideas. The patient is nervous/anxious. The patient is not hyperactive.     Vitals BP 118/74  Pulse 98   Temp 98.1 F (36.7 C)   Ht 5\' 2"  (1.575 m)   Wt 180 lb (81.6 kg)   SpO2 95%   BMI 32.92 kg/m   Objective:   Physical Exam Vitals and nursing note reviewed.  Constitutional:      General: She is not in acute distress.    Appearance: Normal appearance. She is obese. She is not  ill-appearing.  HENT:     Head: Normocephalic and atraumatic.     Right Ear: External ear normal.     Left Ear: External ear normal.     Nose: Nose normal.     Mouth/Throat:     Mouth: Mucous membranes are moist.     Pharynx: Oropharynx is clear.  Eyes:     Extraocular Movements: Extraocular movements intact.     Conjunctiva/sclera: Conjunctivae normal.     Pupils: Pupils are equal, round, and reactive to light.  Cardiovascular:     Rate and Rhythm: Regular rhythm. Tachycardia present.     Pulses: Normal pulses.     Heart sounds: Normal heart sounds.  Pulmonary:     Effort: Pulmonary effort is normal.     Breath sounds: Normal breath sounds. No wheezing, rhonchi or rales.  Musculoskeletal:        General: Normal range of motion.     Cervical back: Normal range of motion.     Right lower leg: No edema.     Left lower leg: No edema.  Skin:    General: Skin is warm and dry.     Findings: No lesion or rash.  Neurological:     General: No focal deficit present.     Mental Status: She is alert and oriented to person, place, and time.     Cranial Nerves: No cranial nerve deficit.  Psychiatric:        Mood and Affect: Mood normal.        Behavior: Behavior normal.      Assessment and Plan   1. Morbid obesity ([place])  2. Current moderate episode of major depressive disorder without prior episode (HCC) - sertraline (ZOLOFT) 100 MG tablet; Take 2 tablets (200 mg total) by mouth daily.  Dispense: 180 tablet; Refill: 0  3. Anxiety - sertraline (ZOLOFT) 100 MG tablet; Take 2 tablets (200 mg total) by mouth daily.  Dispense: 180 tablet; Refill: 0   reviewed labs on 05/08/20. Stable.  Normal TSH, on low end of normal. Recommending staying on the 150mg  daily.  Pt wanting to go back up to 121mcg, however, her TSH is already on low end of normal, showing possible over treating her thyroid. -would give her trial of phenermine.  Advising 3 months and recheck. -pt has elevated HR, cont to  monitor.  On recheck at 98 bpm. -reviewed diet and exercising modifications and the calorie intake sheet for losing 1/2 -1lb per week. Depression- increase zoloft from 150mg  to 200mg  daily.  F/u 33months.

## 2020-05-14 ENCOUNTER — Telehealth: Payer: Self-pay | Admitting: Family Medicine

## 2020-05-14 ENCOUNTER — Other Ambulatory Visit: Payer: Self-pay | Admitting: Family Medicine

## 2020-05-14 MED ORDER — PHENTERMINE HCL 37.5 MG PO CAPS: 37.5000 mg | ORAL_CAPSULE | ORAL | 2 refills | Status: DC

## 2020-05-14 NOTE — Telephone Encounter (Signed)
Left message to return call 

## 2020-05-14 NOTE — Telephone Encounter (Signed)
Please advise. Thank you

## 2020-05-14 NOTE — Telephone Encounter (Signed)
Patient was seen yesterday and was told that thentermine 37.5 would be sent in but sertraline  100 mg was sent in and she states doesnt need or want that. Please advise [facility]

## 2020-05-17 ENCOUNTER — Encounter: Payer: Self-pay | Admitting: Family Medicine

## 2020-05-21 NOTE — Telephone Encounter (Signed)
Tried to contact patient, no voicemail available. Contacted [facility] and Phentermine has been picked up

## 2020-05-24 ENCOUNTER — Encounter: Payer: Self-pay | Admitting: Allergy & Immunology

## 2020-05-24 ENCOUNTER — Ambulatory Visit (INDEPENDENT_AMBULATORY_CARE_PROVIDER_SITE_OTHER): Payer: PPO | Admitting: Allergy & Immunology

## 2020-05-24 ENCOUNTER — Other Ambulatory Visit: Payer: Self-pay

## 2020-05-24 VITALS — BP 130/80 | HR 90 | Temp 98.9°F | Resp 16 | Wt 177.6 lb

## 2020-05-24 DIAGNOSIS — J302 Other seasonal allergic rhinitis: Secondary | ICD-10-CM | POA: Diagnosis not present

## 2020-05-24 DIAGNOSIS — J3089 Other allergic rhinitis: Secondary | ICD-10-CM

## 2020-05-24 DIAGNOSIS — J4540 Moderate persistent asthma, uncomplicated: Secondary | ICD-10-CM

## 2020-05-24 NOTE — Patient Instructions (Addendum)
1. Moderate persistent asthma, uncomplicated - Lung testing deferred since it was not working today.  - Use the Symbicort twice daily EVERY DAY for the best effects. - Daily controller medication(s): Symbicort 80/4.103mcg two puffs twice daily with spacer     - Prior to physical activity: albuterol 2 puffs 10-15 minutes before physical activity. - Rescue medications: albuterol 4 puffs every 4-6 hours as needed - Asthma control goals:  * Full participation in all desired activities (may need albuterol before activity) * Albuterol use two time or less a week on average (not counting use with activity) * Cough interfering with sleep two time or less a month * Oral steroids no more than once a year * No hospitalizations  2. Seasonal and perennial allergic rhinitis (ragweed, weeds, trees and dust mites) - Stop the Zyrtec since this is overdrying your nose. - Continue with: Flonase (fluticasone) two sprays per nostril daily AS NEEDED. - We will go ahead and start allergy shots since you cannot seem to tolerate the antihistamines.  - Consent signed today. - Make an appointment to start in 2-3 weeks.   3. Return in about 3 months (around 08/24/2020). This can be an in-person, a virtual Webex or a telephone follow up visit.   Please inform us of any Emergency Department visits, hospitalizations, or changes in symptoms. Call us before going to the ED for breathing or allergy symptoms since we might be able to fit you in for a sick visit. Feel free to contact us anytime with any questions, problems, or concerns.  It was a pleasure to see you again today!  Websites that have reliable patient information: 1. American Academy of Asthma, Allergy, and Immunology: www.aaaai.org 2. Food Allergy Research and Education (FARE): foodallergy.org 3. Mothers of Asthmatics: http://www.asthmacommunitynetwork.org 4. American College of Allergy, Asthma, and Immunology: www.acaai.org   COVID-19 Vaccine  Information can be found at: [email or web address] For questions related to vaccine distribution or appointments, please email vaccine@ [email or web address] or call [phone].     "Like" [name] on Facebook and Instagram for our latest updates!        Make sure you are registered to vote! If you have moved or changed any of your contact information, you will need to get this updated before voting!  In some cases, you MAY be able to register to vote online: [email or web address]

## 2020-05-24 NOTE — Progress Notes (Signed)
FOLLOW UP  Date of Service/Encounter:  05/26/20   Assessment:   Moderate persistent asthma, uncomplicated  Seasonal and perennial allergic rhinitis (ragweed, weeds, trees and dust mites)  Inability to tolerate antihistamines (overly drying)  Plan/Recommendations:   1. Moderate persistent asthma, uncomplicated - Lung testing deferred since it was not working today.  - Use the Symbicort twice daily EVERY DAY for the best effects. - Daily controller medication(s): Symbicort 80/4.22mcg two puffs twice daily with spacer  - Prior to physical activity: albuterol 2 puffs 10-15 minutes before physical activity. - Rescue medications: albuterol 4 puffs every 4-6 hours as needed - Asthma control goals:  * Full participation in all desired activities (may need albuterol before activity) * Albuterol use two time or less a week on average (not counting use with activity) * Cough interfering with sleep two time or less a month * Oral steroids no more than once a year * No hospitalizations  2. Seasonal and perennial allergic rhinitis (ragweed, weeds, trees and dust mites) - Stop the Zyrtec since this is overdrying your nose. - Continue with: Flonase (fluticasone) two sprays per nostril daily AS NEEDED. - We will go ahead and start allergy shots since you cannot seem to tolerate the antihistamines.  - Consent signed today. - Make an appointment to start in 2-3 weeks.   3. Return in about 3 months (around 08/24/2020). This can be an in-person, a virtual Webex or a telephone follow up visit.   Subjective:   [name] is a 64 y.o. female presenting today for follow up of  Chief Complaint  Patient presents with  . Follow-up  . Asthma    [name] has a history of the following: Patient Active Problem List   Diagnosis Date Noted  . Abdominal pain, epigastric 10/16/2019  . Abdominal bloating 10/16/2019  . Prediabetes 12/13/2017  . Hyperlipidemia 12/04/2016  . Bereavement  09/15/2016  . Fever blister 06/24/2016  . Rosacea 03/07/2014  . Chronic low back pain 10/22/2013  . Morbid obesity ([place]) 10/03/2013  . Anxiety 08/23/2013  . Depression 05/15/2013  . Fatigue 05/15/2013  . Urinary incontinence in female 02/23/2013  . GERD (gastroesophageal reflux disease) 09/19/2012  . Intestinal bacterial overgrowth 07/25/2012  . Hypertension 07/25/2012  . Hypothyroidism 07/25/2012  . Constipation 07/25/2012  . CTS (carpal tunnel syndrome) 04/21/2012  . Trigger finger 04/21/2012    History obtained from: chart review and patient.  [name] is a 64 y.o. female presenting for a follow up visit. She was last seen in May 2021 as a new patient. At that time, she was evaluated for history of recurrent bronchitis, which we felt was secondary to asthma. We started her on Symbicort 2 puffs twice daily with a spacer, starting with a low dose. She had testing that was positive to ragweed, weeds, trees, and dust mites. We continued her on Zyrtec as well as Flonase. We did discuss allergen immunotherapy.  In the interim, she has done fairly well. She does feel better than she did when she first saw me.  Asthma/Respiratory Symptom History: She has not been consistent with the Symbicort. She has been using albuterol twice daily. She seems to be confused as to what is a daily medication and what is an as needed medication. Regardless, she has not needed any prednisone and has not been to the emergency room. She will occasionally still have a cough.  Allergic Rhinitis Symptom History: She is interested in starting allergy shots and would like to  discuss that more today. She tells me that the Zyrtec makes her overly dry. She is not using it at all. She uses saline spray and it burns in her nose. She has not needed any antibiotics.  Otherwise, there have been no changes to her past medical history, surgical history, family history, or social history.    Review of Systems  Constitutional:  Negative.  Negative for chills, fever, malaise/fatigue and weight loss.  HENT: Negative.  Negative for congestion, ear discharge and ear pain.   Eyes: Negative for pain, discharge and redness.  Respiratory: Positive for cough. Negative for sputum production, shortness of breath and wheezing.   Cardiovascular: Negative.  Negative for chest pain and palpitations.  Gastrointestinal: Negative for abdominal pain, heartburn, nausea and vomiting.  Skin: Negative.  Negative for itching and rash.  Neurological: Negative for dizziness and headaches.  Endo/Heme/Allergies: Negative for environmental allergies. Does not bruise/bleed easily.       Objective:   Blood pressure 130/80, pulse 90, temperature 98.9 F (37.2 C), temperature source Temporal, resp. rate 16, weight 177 lb 9.6 oz (80.6 kg), SpO2 95 %. Body mass index is 32.48 kg/m.   Physical Exam:  Physical Exam  Constitutional: She appears well-developed.  HENT:  Head: Normocephalic and atraumatic.  Right Ear: Tympanic membrane, external ear and ear canal normal.  Left Ear: Tympanic membrane and ear canal normal.  Nose: No mucosal edema, rhinorrhea, nasal deformity or septal deviation. Right sinus exhibits no maxillary sinus tenderness and no frontal sinus tenderness. Left sinus exhibits no maxillary sinus tenderness and no frontal sinus tenderness.  Mouth/Throat: Uvula is midline. Mucous membranes are not pale and not dry.  Eyes: Pupils are equal, round, and reactive to light. Conjunctivae are normal. Right eye exhibits no chemosis and no discharge. Left eye exhibits no chemosis and no discharge. Right conjunctiva is not injected. Left conjunctiva is not injected.  Cardiovascular: Normal rate, regular rhythm and normal heart sounds.  Respiratory: Effort normal and breath sounds normal. No accessory muscle usage. No tachypnea. No respiratory distress. She has no wheezes. She has no rhonchi. She has no rales. She exhibits no tenderness.    Lymphadenopathy:    She has no cervical adenopathy.  Neurological: She is alert.  Skin: No abrasion, no petechiae and no rash noted. Rash is not papular, not vesicular and not urticarial. No erythema. No pallor.     Diagnostic studies: none     Malachi Bonds, MD  [facility]

## 2020-05-26 ENCOUNTER — Encounter: Payer: Self-pay | Admitting: Allergy & Immunology

## 2020-05-27 ENCOUNTER — Ambulatory Visit (INDEPENDENT_AMBULATORY_CARE_PROVIDER_SITE_OTHER): Payer: PPO

## 2020-05-27 ENCOUNTER — Ambulatory Visit
Admission: EM | Admit: 2020-05-27 | Discharge: 2020-05-27 | Disposition: A | Discharge status: Home / Self Care | Payer: PPO | Attending: Emergency Medicine | Admitting: Emergency Medicine

## 2020-05-27 ENCOUNTER — Encounter: Payer: Self-pay | Admitting: Emergency Medicine

## 2020-05-27 ENCOUNTER — Telehealth (INDEPENDENT_AMBULATORY_CARE_PROVIDER_SITE_OTHER): Payer: Self-pay | Admitting: *Deleted

## 2020-05-27 ENCOUNTER — Other Ambulatory Visit: Payer: Self-pay

## 2020-05-27 ENCOUNTER — Encounter (INDEPENDENT_AMBULATORY_CARE_PROVIDER_SITE_OTHER): Payer: Self-pay

## 2020-05-27 ENCOUNTER — Other Ambulatory Visit: Payer: Self-pay | Admitting: Family Medicine

## 2020-05-27 DIAGNOSIS — K5900 Constipation, unspecified: Secondary | ICD-10-CM | POA: Diagnosis not present

## 2020-05-27 DIAGNOSIS — R109 Unspecified abdominal pain: Secondary | ICD-10-CM | POA: Diagnosis not present

## 2020-05-27 MED ORDER — ONDANSETRON HCL 4 MG PO TABS: 4.0000 mg | ORAL_TABLET | Freq: Four times a day (QID) | ORAL | 0 refills | Status: DC

## 2020-05-27 MED ORDER — CIPROFLOXACIN HCL 500 MG PO TABS
500.0000 mg | ORAL_TABLET | Freq: Two times a day (BID) | ORAL | 0 refills | Status: AC
Start: 2020-05-27 — End: 2020-06-26

## 2020-05-27 NOTE — ED Provider Notes (Signed)
[facility]   [id number] 05/27/20 Arrival Time: 2423  CC: Constipation  SUBJECTIVE:  [name] is a 64 y.o. female who presents for medication refill.  Reports constipation x 6 days secondary to bacterial overgrowth.  Normally sees Dr. Elesa Massed, but unable to get in with them today.  Reports improvement in symptoms with taking cipro x 30 days.  Denies pain, but admits to pressure in lower abdomen.  Has tried fleet enema, mineral oil, and stool softener without relief.  Denies aggravating factors.  Reports similar symptoms in the past.  Last BM 6 days ago.  Complains of associated nausea.    Denies fever, chills, vomiting, chest pain, SOB, diarrhea, hematochezia, melena, dysuria, difficulty urinating, increased frequency or urgency, flank pain, loss of bowel or bladder function.  No LMP recorded. Patient has had a hysterectomy.  ROS: As per HPI.  All other pertinent ROS negative.     Past Medical History:  Diagnosis Date  . Anxiety   . Arthritis   . Depression   . GERD (gastroesophageal reflux disease)   . Hypertension   . Hypothyroid   . Hypothyroidism   . PONV (postoperative nausea and vomiting)   . Recurrent upper respiratory infection (URI)   . Urticaria    Past Surgical History:  Procedure Laterality Date  . BACK SURGERY    . CARPAL TUNNEL RELEASE  06/16/2012   Procedure: CARPAL TUNNEL RELEASE;  Surgeon: Roseanne Kaufman, MD;  Location: [facility];  Service: Orthopedics;  Laterality: Right;  limited open carpal tunnel release  . CERVICAL FUSION  2018  . CHOLECYSTECTOMY    . COLONOSCOPY    . TONSILLECTOMY    . TRIGGER FINGER RELEASE  06/16/2012   Procedure: RELEASE TRIGGER FINGER/A-1 PULLEY;  Surgeon: Roseanne Kaufman, MD;  Location: [facility];  Service: Orthopedics;  Laterality: Right;  right middle a-1 release  . TUBAL LIGATION    . UPPER GASTROINTESTINAL ENDOSCOPY    . VAGINAL HYSTERECTOMY     Allergies  Allergen Reactions  .  Penicillins Hives   No current facility-administered medications on file prior to encounter.   Current Outpatient Medications on File Prior to Encounter  Medication Sig Dispense Refill  . albuterol (PROVENTIL) (2.5 MG/3ML) 0.083% nebulizer solution Use via neb q 4 hrs prn wheezing 25 mL 0  . albuterol (VENTOLIN HFA) 108 (90 Base) MCG/ACT inhaler Inhale 1-2 puffs into the lungs every 6 (six) hours as needed for wheezing or shortness of breath. 18 g 0  . budesonide-formoterol (SYMBICORT) 80-4.5 MCG/ACT inhaler Inhale 2 puffs into the lungs 2 (two) times daily. 1 Inhaler 5  . fluticasone (FLONASE) 50 MCG/ACT nasal spray Place 2 sprays into both nostrils daily. 16 g 5  . gabapentin (NEURONTIN) 100 MG capsule 100 mg. Patient states that she takes as needed for back.    Marland Kitchen ibuprofen (ADVIL) 200 MG tablet Take 200 mg by mouth as needed.    Marland Kitchen levothyroxine (SYNTHROID) 150 MCG tablet Take one and a half tablets on Sunday and one tablet all other days 96 tablet 1  . lisinopril (ZESTRIL) 20 MG tablet TAKE (1) TABLET BY MOUTH ONCE DAILY. 90 tablet 1  . meloxicam (MOBIC) 7.5 MG tablet Take 7.5 mg by mouth daily.    . pantoprazole (PROTONIX) 40 MG tablet Take 1 tablet (40 mg total) by mouth daily before breakfast. 30 tablet 5  . phentermine 37.5 MG capsule Take 1 capsule (37.5 mg total) by mouth every morning. 30 capsule  2  . PREVIDENT 5000 BOOSTER 1.1 % PSTE APPLY THIN RIBBON ANDEBRUSH FOR 2 MINUTES AT BEDTIME. SPIT AFTER BRUSH.    . sertraline (ZOLOFT) 100 MG tablet Take 2 tablets (200 mg total) by mouth daily. 180 tablet 0  . valACYclovir (VALTREX) 1000 MG tablet TAKE 1 TABLET BY MOUTH THREE TIMES DAILY FOR 7 DAYS. 21 tablet 3   Social History   Socioeconomic History  . Marital status: Married    Spouse name: Not on file  . Number of children: Not on file  . Years of education: 22  . Highest education level: Not on file  Occupational History  . Not on file  Tobacco Use  . Smoking status: Former  Smoker    Packs/day: 1.50    Years: 27.00    Pack years: 40.50    Start date: 1973    Quit date: 2000    Years since quitting: 21.4  . Smokeless tobacco: Never Used  Vaping Use  . Vaping Use: Never used  Substance and Sexual Activity  . Alcohol use: No  . Drug use: No  . Sexual activity: Not on file  Other Topics Concern  . Not on file  Social History Narrative  . Not on file   Social Determinants of Health   Financial Resource Strain:   . Difficulty of Paying Living Expenses:   Food Insecurity:   . Worried About Programme researcher, broadcasting/film/video in the Last Year:   . Barista in the Last Year:   Transportation Needs:   . Freight forwarder (Medical):   Marland Kitchen Lack of Transportation (Non-Medical):   Physical Activity:   . Days of Exercise per Week:   . Minutes of Exercise per Session:   Stress:   . Feeling of Stress :   Social Connections:   . Frequency of Communication with Friends and Family:   . Frequency of Social Gatherings with Friends and Family:   . Attends Religious Services:   . Active Member of Clubs or Organizations:   . Attends Banker Meetings:   Marland Kitchen Marital Status:   Intimate Partner Violence:   . Fear of Current or Ex-Partner:   . Emotionally Abused:   Marland Kitchen Physically Abused:   . Sexually Abused:    Family History  Problem Relation Age of Onset  . COPD Mother   . Osteoporosis Mother   . Immunodeficiency Mother   . COPD Father   . Emphysema Father   . Tuberculosis Father   . Lung disease Father   . COPD Sister   . Healthy Daughter   . Healthy Son   . Arthritis Other   . Lung disease Other   . Cancer Other   . Asthma Other   . Eczema Neg Hx   . Atopy Neg Hx   . Angioedema Neg Hx   . Allergic rhinitis Neg Hx      OBJECTIVE:  Vitals:   05/27/20 1159 05/27/20 1203  BP: 107/67   Pulse: 87   Resp: 18   Temp: 98.3 F (36.8 C)   TempSrc: Oral   SpO2: 94%   Weight:  177 lb (80.3 kg)  Height:  5\' 1"  (1.549 m)    General  appearance: Alert; NAD HEENT: NCAT.  Oropharynx clear.  Lungs: clear to auscultation bilaterally without adventitious breath sounds Heart: regular rate and rhythm.   Abdomen: soft, non-distended; normal active bowel sounds; mild diffuse lower abdominal TTP; nontender at McBurney's point; negative Murphy's  sign; no guarding Extremities: no edema; symmetrical with no gross deformities Skin: warm and dry Neurologic: normal gait Psychological: alert and cooperative; normal mood and affect  DIAGNOSTIC STUDIES: DG Abdomen 1 View  Result Date: 05/27/2020 CLINICAL DATA:  Constipation. EXAM: ABDOMEN - 1 VIEW COMPARISON:  Radiographs dated 07/26/2008 and CT scan of the abdomen and pelvis dated 10/23/2019 FINDINGS: There is a small amount of stool scattered throughout the colon without evidence of fecal impaction. No dilated bowel. No abnormal abdominal calcifications. Bones are normal. IMPRESSION: Benign-appearing abdomen. Electronically Signed   By: Francene Boyers M.D.   On: 05/27/2020 13:08     X-rays positive for stool burden  I have reviewed the x-rays myself and the radiologist interpretation. I am in agreement with the radiologist interpretation.     ASSESSMENT & PLAN:  1. Abdominal pressure   2. Constipation, unspecified constipation type     Meds ordered this encounter  Medications  . ciprofloxacin (CIPRO) 500 MG tablet    Sig: Take 1 tablet (500 mg total) by mouth 2 (two) times daily.    Dispense:  60 tablet    Refill:  0    Order Specific Question:   Supervising Provider    Answer:   Eustace Moore [[id number]]  . ondansetron (ZOFRAN) 4 MG tablet    Sig: Take 1 tablet (4 mg total) by mouth every 6 (six) hours.    Dispense:  12 tablet    Refill:  0    Order Specific Question:   Supervising Provider    Answer:   Eustace Moore [[id number]]    X-rays showed stool burden, but no obstruction.  Radiologist interpretation pending.  I will follow up with you if anything else  abnormal is read Get rest and drink fluids Zofran prescribed.  Take as directed.   Ciprofloxacin prescription refilled for bacterial overgrowth Please follow up with Dr. Hessie Diener ASAP for further evaluation and management If you experience new or worsening symptoms go to the ER such as fever, chills, nausea, vomiting, diarrhea, bloody or dark tarry stools, constipation, urinary symptoms, worsening abdominal discomfort, symptoms that do not improve with medications, inability to keep fluids down, etc...  Reviewed expectations re: course of current medical issues. Questions answered. Outlined signs and symptoms indicating need for more acute intervention. Patient verbalized understanding. After Visit Summary given.   Rennis Harding, PA-C 05/27/20 1315

## 2020-05-27 NOTE — Telephone Encounter (Signed)
12:09 today  Patient called in asking why we denied refilling her Cipro and she was upset Dr. Karilyn Cota would not see her today.  She said she had not  requested this medication in over a year and also wanted to cancel her 7/7 appointment her with the PA here.  She stated she was at urgent care after having a lot of pain over weekend.  I stressed to her I would find out and that most people that answer the phone would not necessarily know the  answer with out asking a clinical person as they were clerical and would not know the answer.    She obviously was not feeling well and I explained that Dr. Karilyn Cota was not in the office today and I would see what was going on.  Again, she was calling from urgent care checked in as a patient  there.   I investigated and a RX was not sent to [name] nor was a visit given or denied as Mitzie had not had a chance to call her back at that time.  The RX went to [facility] and it appeared they made her an appointment there for tomorrow which was also canceled .  I have sent her a MyChart message to help her understand we did not deny her medication here .

## 2020-05-27 NOTE — Telephone Encounter (Signed)
Noted  

## 2020-05-27 NOTE — Discharge Instructions (Addendum)
X-rays showed stool burden, but no obstruction.  Radiologist interpretation pending.  I will follow up with you if anything else abnormal is read Get rest and drink fluids Zofran prescribed.  Take as directed.   Ciprofloxacin prescription refilled for bacterial overgrowth Please follow up with Dr. Hessie Diener ASAP for further evaluation and management If you experience new or worsening symptoms go to the ER such as fever, chills, nausea, vomiting, diarrhea, bloody or dark tarry stools, constipation, urinary symptoms, worsening abdominal discomfort, symptoms that do not improve with medications, inability to keep fluids down, etc..Marland Kitchen

## 2020-05-27 NOTE — ED Triage Notes (Addendum)
Pt states she has bacterial overgrowth in her stomach and normally sees Dr Hessie Diener, he is unavailable for appt. She has not had a good bowel movement in 6 days.  She has tried fleet enema, mineral oil, stool softner, she had minimal results with the enema.  States Dr Hessie Diener gives her a 30 day supply of cipro to get rid of the bacteria and it works to resolve her problem. Pharmacy tried to contact dr Alford Highland office for refill and it was denied.  Pt says she tried to call the office and find out why but they will not answer.

## 2020-05-28 ENCOUNTER — Ambulatory Visit (INDEPENDENT_AMBULATORY_CARE_PROVIDER_SITE_OTHER): Payer: PPO | Admitting: Internal Medicine

## 2020-05-28 ENCOUNTER — Other Ambulatory Visit: Payer: Self-pay

## 2020-05-28 ENCOUNTER — Other Ambulatory Visit (INDEPENDENT_AMBULATORY_CARE_PROVIDER_SITE_OTHER): Payer: Self-pay | Admitting: *Deleted

## 2020-05-28 ENCOUNTER — Ambulatory Visit: Payer: PPO | Admitting: Family Medicine

## 2020-05-28 DIAGNOSIS — K5904 Chronic idiopathic constipation: Secondary | ICD-10-CM

## 2020-05-28 DIAGNOSIS — R1013 Epigastric pain: Secondary | ICD-10-CM | POA: Diagnosis not present

## 2020-05-28 DIAGNOSIS — R140 Abdominal distension (gaseous): Secondary | ICD-10-CM | POA: Diagnosis not present

## 2020-05-28 MED ORDER — MOTEGRITY 1 MG PO TABS: 1.0000 mg | ORAL_TABLET | Freq: Every day | ORAL | 5 refills | Status: DC

## 2020-05-28 NOTE — Progress Notes (Signed)
Presenting complaint;  Abdominal pain bloating constipation  Database and subjective:  Patient is 64 year old Caucasian female who has chronic constipation history of small intestinal bacterial overgrowth diagnosed in November 2008 as well as history of GERD who was last seen in November 2020 for more or less similar symptomatology. CBC and comprehensive chemistry panel were unremarkable. Abdominal pelvic CT revealed colonic diverticulosis without evidence of diverticulitis in determinate 11 mm low-attenuation lesion in segment 4B of left hepatic lobe. MR liver on 01/05/2020 confirmed this liver lesion to be 10 mm benign cyst in the left lobe.  Patient states she was doing well until about 8 days ago. Since then she has noted progressive abdominal distention epigastric and bilateral lateral mid abdominal pain and constipation. Over the weekend she took mineral oil followed by fleets enema with minimal results. She has experienced nausea without vomiting. Her appetite has been fair. She did have fever and chills yesterday. She says her temp was 101. No history of dysuria or cough. She states her bowels have been moving fairly regularly before onset of the symptoms. She says heartburn is well controlled with medication. The only medication change was that she started to take phentermine on few days before onset of the symptoms. She says she has taken phentermine off and on in the past and has not experienced any side effects. Patient says her epigastric pain involves almost a circular area. She does recall that she experienced lower abdominal pain before onset of constipation. She has tried Linzess in the past but it has not worked. Patient felt miserable yesterday. She was seen at urgent care. She had plain abdominal series. She was given Cipro for 1 month and advised to follow-up with [name]. Patient is under the impression that I refused to fill Cipro but we never got this request. Review of patient's  records revealed that she has gained 8 pounds since her last visit. Patient states she uses inhalers every day she uses nebulizer couple of times a month. She is on Zoloft for anxiety. Patient states she has not been very active physically since onset of pandemic.  Patient had EGD and colonoscopy in March 2007. EGD revealed small gastric polyp which was hyperplastic on biopsy and colonoscopy revealed internal hemorrhoids.  Current Medications: Outpatient Encounter Medications as of 05/28/2020  Medication Sig  . albuterol (PROVENTIL) (2.5 MG/3ML) 0.083% nebulizer solution Use via neb q 4 hrs prn wheezing  . albuterol (VENTOLIN HFA) 108 (90 Base) MCG/ACT inhaler Inhale 1-2 puffs into the lungs every 6 (six) hours as needed for wheezing or shortness of breath.  . budesonide-formoterol (SYMBICORT) 80-4.5 MCG/ACT inhaler Inhale 2 puffs into the lungs 2 (two) times daily.  . ciprofloxacin (CIPRO) 500 MG tablet Take 1 tablet (500 mg total) by mouth 2 (two) times daily.  . fluticasone (FLONASE) 50 MCG/ACT nasal spray Place 2 sprays into both nostrils daily.  Marland Kitchen gabapentin (NEURONTIN) 100 MG capsule 100 mg. Patient states that she takes as needed for back.  Marland Kitchen ibuprofen (ADVIL) 200 MG tablet Take 200 mg by mouth as needed.  Marland Kitchen levothyroxine (SYNTHROID) 150 MCG tablet Take one and a half tablets on Sunday and one tablet all other days  . lisinopril (ZESTRIL) 20 MG tablet TAKE (1) TABLET BY MOUTH ONCE DAILY.  . meloxicam (MOBIC) 7.5 MG tablet Take 7.5 mg by mouth daily.  . pantoprazole (PROTONIX) 40 MG tablet Take 1 tablet (40 mg total) by mouth daily before breakfast.  . phentermine 37.5 MG capsule Take 1 capsule (  37.5 mg total) by mouth every morning.  Marland Kitchen PREVIDENT 5000 BOOSTER 1.1 % PSTE APPLY THIN RIBBON ANDEBRUSH FOR 2 MINUTES AT BEDTIME. SPIT AFTER BRUSH.  . sertraline (ZOLOFT) 100 MG tablet Take 2 tablets (200 mg total) by mouth daily.  . valACYclovir (VALTREX) 1000 MG tablet TAKE 1 TABLET BY MOUTH  THREE TIMES DAILY FOR 7 DAYS.  Marland Kitchen ondansetron (ZOFRAN) 4 MG tablet Take 1 tablet (4 mg total) by mouth every 6 (six) hours. (Patient not taking: Reported on 05/28/2020)   No facility-administered encounter medications on file as of 05/28/2020.     Objective: Blood pressure 118/72, pulse 64, temperature 97.8 F (36.7 C), temperature source Oral, height '5\' 2"'$  (1.575 m), weight 178 lb 2 oz (77.2 kg). Conjunctiva is pink. Sclera is nonicteric Oropharyngeal mucosa is normal. No neck masses or thyromegaly noted. Cardiac exam with regular rhythm normal S1 and S2. No murmur or gallop noted. Lungs are clear to auscultation. Abdomen is protuberant. Bowel sounds are normal. On palpation abdomen is soft with mild midepigastric tenderness. Percussion note is normal and not particularly tender today. No organomegaly or masses. No LE edema or clubbing noted.  Labs/studies Results:  CBC Latest Ref Rng & Units 10/16/2019 04/10/2019 05/07/2016  WBC 3.4 - 10.8 x10E3/uL 7.0 6.0 -  Hemoglobin 11.1 - 15.9 g/dL 12.3 13.6 13.5  Hematocrit 34.0 - 46.6 % 36.5 39.7 -  Platelets 150 - 450 x10E3/uL 237 247 -    CMP Latest Ref Rng & Units 05/08/2020 01/05/2020 10/16/2019  Glucose 65 - 99 mg/dL 113(H) - 88  BUN 8 - 27 mg/dL 15 - 16  Creatinine 0.57 - 1.00 mg/dL 0.89 0.80 0.89  Sodium 134 - 144 mmol/L 139 - 141  Potassium 3.5 - 5.2 mmol/L 4.8 - 4.6  Chloride 96 - 106 mmol/L 104 - 104  CO2 20 - 29 mmol/L 23 - 23  Calcium 8.7 - 10.3 mg/dL 9.6 - 9.8  Total Protein 6.0 - 8.5 g/dL 7.4 - 7.1  Total Bilirubin 0.0 - 1.2 mg/dL <0.2 - 0.2  Alkaline Phos 48 - 121 IU/L 76 - 67  AST 0 - 40 IU/L 18 - 19  ALT 0 - 32 IU/L 23 - 19    Hepatic Function Latest Ref Rng & Units 05/08/2020 10/16/2019 04/10/2019  Total Protein 6.0 - 8.5 g/dL 7.4 7.1 7.0  Albumin 3.8 - 4.8 g/dL 4.4 4.2 4.3  AST 0 - 40 IU/L '18 19 20  '$ ALT 0 - 32 IU/L '23 19 26  '$ Alk Phosphatase 48 - 121 IU/L 76 67 63  Total Bilirubin 0.0 - 1.2 mg/dL <0.2 0.2 <0.2  Bilirubin,  Direct 0.00 - 0.40 mg/dL - - 0.06    Lab Results  Component Value Date   CRP 5 12/28/2019    Acute abdominal series from 05/27/2020 reviewed. Stool noted throughout the colon with small amount of gas in small and large bowel. No air-fluid levels or dilated bowel.  TSH level was 0.752 on 05/08/2020.  Assessment:  #1. Acute on chronic constipation. She has history of constipation and has been doing well until 8 days ago. I suspect constipation may have been triggered by phentermine which she is not taking anymore. She appears to be on appropriate dose of thyroxine. She had similar symptomatology back in November last year when she had CT followed by MRI a few weeks later but there was no adenopathy or ascites. I do not feel the studies need to be repeated. She is due for screening colonoscopy  however. Last exam was in March 2007.  #2. History of small intestinal bacterial overgrowth. Patient was begun on Cipro at urgent care yesterday. She will continue this medication for now. It is interesting to know that abdominal film from yesterday reveals small amount of gas in bowel.  #3. History of GERD. She is doing well with PPI.  #4. Epigastric pain. This pain may be due to her acute symptoms. Need to rule out peptic ulcer disease.   Plan:  Patient will continue Cipro at current dose which is 5 mg p.o. twice daily. She'll stay on it for at least 2 weeks. Begin Motegrity/prucalopride 1 mg by mouth every morning presuming it is covered by her insurance. Patient advised to take MiraLAX four doses tomorrow. She'll need to stay at home because she might have diarrhea. Diagnostic esophagogastroduodenoscopy and average rescreening colonoscopy to be scheduled in near future. Office visit in 2 months.

## 2020-05-28 NOTE — Patient Instructions (Signed)
Take 4 doses of MiraLAX tomorrow. Begin Motegrity/prucalopride 1 mg by mouth daily with breakfast. Call office with progress report on 05/31/2020. Esophagogastroduodenoscopy and colonoscopy to be scheduled.

## 2020-05-29 ENCOUNTER — Encounter (INDEPENDENT_AMBULATORY_CARE_PROVIDER_SITE_OTHER): Payer: Self-pay | Admitting: *Deleted

## 2020-05-29 ENCOUNTER — Other Ambulatory Visit (INDEPENDENT_AMBULATORY_CARE_PROVIDER_SITE_OTHER): Payer: Self-pay | Admitting: *Deleted

## 2020-05-29 DIAGNOSIS — R140 Abdominal distension (gaseous): Secondary | ICD-10-CM

## 2020-05-29 DIAGNOSIS — K5904 Chronic idiopathic constipation: Secondary | ICD-10-CM

## 2020-05-30 ENCOUNTER — Encounter (INDEPENDENT_AMBULATORY_CARE_PROVIDER_SITE_OTHER): Payer: Self-pay | Admitting: Internal Medicine

## 2020-05-30 NOTE — Progress Notes (Signed)
[name] verified she has not been taking Phentermine. Knows to hold it until after endoscopy

## 2020-05-31 ENCOUNTER — Other Ambulatory Visit (HOSPITAL_COMMUNITY)
Admission: RE | Admit: 2020-05-31 | Discharge: 2020-05-31 | Disposition: A | Discharge status: Home / Self Care | Payer: PPO | Source: Ambulatory Visit | Attending: Internal Medicine | Admitting: Internal Medicine

## 2020-05-31 ENCOUNTER — Other Ambulatory Visit: Payer: Self-pay

## 2020-05-31 ENCOUNTER — Encounter (HOSPITAL_COMMUNITY)
Admission: RE | Admit: 2020-05-31 | Discharge: 2020-05-31 | Disposition: A | Discharge status: Home / Self Care | Payer: PPO | Source: Ambulatory Visit | Attending: Internal Medicine | Admitting: Internal Medicine

## 2020-05-31 DIAGNOSIS — Z20822 Contact with and (suspected) exposure to covid-19: Secondary | ICD-10-CM | POA: Insufficient documentation

## 2020-05-31 DIAGNOSIS — Z01812 Encounter for preprocedural laboratory examination: Secondary | ICD-10-CM | POA: Insufficient documentation

## 2020-06-01 LAB — SARS CORONAVIRUS 2 (TAT 6-24 HRS): SARS Coronavirus 2: NEGATIVE

## 2020-06-03 ENCOUNTER — Encounter (HOSPITAL_COMMUNITY): Payer: Self-pay | Admitting: Internal Medicine

## 2020-06-03 ENCOUNTER — Other Ambulatory Visit: Payer: Self-pay

## 2020-06-03 ENCOUNTER — Ambulatory Visit (HOSPITAL_COMMUNITY)
Admission: RE | Admit: 2020-06-03 | Discharge: 2020-06-03 | Disposition: A | Discharge status: Home / Self Care | Payer: PPO | Attending: Internal Medicine | Admitting: Internal Medicine

## 2020-06-03 ENCOUNTER — Ambulatory Visit (HOSPITAL_COMMUNITY): Payer: PPO | Admitting: Anesthesiology

## 2020-06-03 ENCOUNTER — Encounter (HOSPITAL_COMMUNITY)
Admission: RE | Disposition: A | Discharge status: Home / Self Care | Payer: Self-pay | Source: Home / Self Care | Attending: Internal Medicine

## 2020-06-03 DIAGNOSIS — R1013 Epigastric pain: Secondary | ICD-10-CM | POA: Insufficient documentation

## 2020-06-03 DIAGNOSIS — Z79899 Other long term (current) drug therapy: Secondary | ICD-10-CM | POA: Diagnosis not present

## 2020-06-03 DIAGNOSIS — E039 Hypothyroidism, unspecified: Secondary | ICD-10-CM | POA: Diagnosis not present

## 2020-06-03 DIAGNOSIS — Z87891 Personal history of nicotine dependence: Secondary | ICD-10-CM | POA: Diagnosis not present

## 2020-06-03 DIAGNOSIS — Z79890 Hormone replacement therapy (postmenopausal): Secondary | ICD-10-CM | POA: Diagnosis not present

## 2020-06-03 DIAGNOSIS — K644 Residual hemorrhoidal skin tags: Secondary | ICD-10-CM | POA: Diagnosis not present

## 2020-06-03 DIAGNOSIS — K5900 Constipation, unspecified: Secondary | ICD-10-CM | POA: Insufficient documentation

## 2020-06-03 DIAGNOSIS — K319 Disease of stomach and duodenum, unspecified: Secondary | ICD-10-CM | POA: Diagnosis not present

## 2020-06-03 DIAGNOSIS — I10 Essential (primary) hypertension: Secondary | ICD-10-CM | POA: Insufficient documentation

## 2020-06-03 DIAGNOSIS — Z7951 Long term (current) use of inhaled steroids: Secondary | ICD-10-CM | POA: Insufficient documentation

## 2020-06-03 DIAGNOSIS — F419 Anxiety disorder, unspecified: Secondary | ICD-10-CM | POA: Diagnosis not present

## 2020-06-03 DIAGNOSIS — K228 Other specified diseases of esophagus: Secondary | ICD-10-CM | POA: Diagnosis not present

## 2020-06-03 DIAGNOSIS — K3189 Other diseases of stomach and duodenum: Secondary | ICD-10-CM | POA: Diagnosis not present

## 2020-06-03 DIAGNOSIS — R140 Abdominal distension (gaseous): Secondary | ICD-10-CM

## 2020-06-03 DIAGNOSIS — F329 Major depressive disorder, single episode, unspecified: Secondary | ICD-10-CM | POA: Insufficient documentation

## 2020-06-03 DIAGNOSIS — K219 Gastro-esophageal reflux disease without esophagitis: Secondary | ICD-10-CM | POA: Insufficient documentation

## 2020-06-03 DIAGNOSIS — K5904 Chronic idiopathic constipation: Secondary | ICD-10-CM

## 2020-06-03 DIAGNOSIS — K5790 Diverticulosis of intestine, part unspecified, without perforation or abscess without bleeding: Secondary | ICD-10-CM | POA: Diagnosis not present

## 2020-06-03 DIAGNOSIS — Z791 Long term (current) use of non-steroidal anti-inflammatories (NSAID): Secondary | ICD-10-CM | POA: Diagnosis not present

## 2020-06-03 DIAGNOSIS — K5730 Diverticulosis of large intestine without perforation or abscess without bleeding: Secondary | ICD-10-CM | POA: Diagnosis not present

## 2020-06-03 HISTORY — PX: ESOPHAGOGASTRODUODENOSCOPY (EGD) WITH PROPOFOL: SHX5813

## 2020-06-03 HISTORY — PX: BIOPSY: SHX5522

## 2020-06-03 HISTORY — PX: COLONOSCOPY WITH PROPOFOL: SHX5780

## 2020-06-03 SURGERY — COLONOSCOPY WITH PROPOFOL
Anesthesia: General

## 2020-06-03 MED ORDER — LIDOCAINE VISCOUS HCL 2 % MT SOLN
OROMUCOSAL | Status: AC
  Filled 2020-06-03: qty 15

## 2020-06-03 MED ORDER — METAMUCIL SMOOTH TEXTURE 58.6 % PO POWD
1.0000 | Freq: Every day | ORAL | Status: DC
Start: 2020-06-03 — End: 2021-07-31

## 2020-06-03 MED ORDER — PROPOFOL 500 MG/50ML IV EMUL
INTRAVENOUS | Status: DC | PRN
  Administered 2020-06-03: 200 ug/kg/min via INTRAVENOUS
  Administered 2020-06-03: 125 ug/kg/min via INTRAVENOUS

## 2020-06-03 MED ORDER — GLYCOPYRROLATE 0.2 MG/ML IJ SOLN
0.2000 mg | Freq: Once | INTRAMUSCULAR | Status: AC
  Administered 2020-06-03: 0.2 mg via INTRAVENOUS

## 2020-06-03 MED ORDER — PROPOFOL 10 MG/ML IV BOLUS
INTRAVENOUS | Status: DC | PRN
  Administered 2020-06-03: 60 mg via INTRAVENOUS

## 2020-06-03 MED ORDER — POLYETHYLENE GLYCOL 3350 17 GM/SCOOP PO POWD
17.0000 g | Freq: Every day | ORAL | 5 refills | Status: AC
Start: 2020-06-03 — End: ?

## 2020-06-03 MED ORDER — PHENYLEPHRINE HCL (PRESSORS) 10 MG/ML IV SOLN
INTRAVENOUS | Status: DC | PRN
Start: 2020-06-03 — End: 2020-06-03
  Administered 2020-06-03: 80 ug via INTRAVENOUS
  Administered 2020-06-03: 40 ug via INTRAVENOUS

## 2020-06-03 MED ORDER — LIDOCAINE HCL (CARDIAC) PF 50 MG/5ML IV SOSY
PREFILLED_SYRINGE | INTRAVENOUS | Status: DC | PRN
  Administered 2020-06-03: 100 mg via INTRAVENOUS

## 2020-06-03 MED ORDER — CHLORHEXIDINE GLUCONATE CLOTH 2 % EX PADS: 6.0000 | MEDICATED_PAD | Freq: Once | CUTANEOUS | Status: DC

## 2020-06-03 MED ORDER — LACTATED RINGERS IV SOLN: INTRAVENOUS | Status: DC

## 2020-06-03 MED ORDER — ONDANSETRON HCL 4 MG/2ML IJ SOLN
INTRAMUSCULAR | Status: DC | PRN
  Administered 2020-06-03: 4 mg via INTRAVENOUS

## 2020-06-03 MED ORDER — LIDOCAINE VISCOUS HCL 2 % MT SOLN
15.0000 mL | Freq: Once | OROMUCOSAL | Status: AC
  Administered 2020-06-03: 15 mL via OROMUCOSAL

## 2020-06-03 MED ORDER — GLYCOPYRROLATE 0.2 MG/ML IJ SOLN
INTRAMUSCULAR | Status: AC
  Filled 2020-06-03: qty 1

## 2020-06-03 NOTE — Anesthesia Procedure Notes (Signed)
Date/Time: 06/03/2020 12:47 PM Performed by: Franco Nones, CRNA Pre-anesthesia Checklist: Patient identified, Emergency Drugs available, Suction available, Timeout performed and Patient being monitored Patient Re-evaluated:Patient Re-evaluated prior to induction Oxygen Delivery Method: Non-rebreather mask

## 2020-06-03 NOTE — Op Note (Signed)
[facility] Patient Name: [name] Procedure Date: 06/03/2020 12:04 PM MRN: [id number] Date of Birth: 03/11/1956 Attending MD: Hildred Laser , MD CSN: [id number] Age: 64 Admit Type: Outpatient Procedure:                Upper GI endoscopy Indications:              Epigastric abdominal pain, Abdominal bloating Providers:                Hildred Laser, MD, Crystal Page, Aram Candela Referring MD:             Elvia Collum, DO Medicines:                Propofol per Anesthesia Complications:            No immediate complications. Estimated Blood Loss:     Estimated blood loss was minimal. Procedure:                Pre-Anesthesia Assessment:                           - Prior to the procedure, a History and Physical                            was performed, and patient medications and                            allergies were reviewed. The patient's tolerance of                            previous anesthesia was also reviewed. The risks                            and benefits of the procedure and the sedation                            options and risks were discussed with the patient.                            All questions were answered, and informed consent                            was obtained. Prior Anticoagulants: The patient has                            taken no previous anticoagulant or antiplatelet                            agents except for NSAID medication. ASA Grade                            Assessment: II - A patient with mild systemic                            disease. After reviewing the risks and benefits,  the patient was deemed in satisfactory condition to                            undergo the procedure.                           After obtaining informed consent, the endoscope was                            passed under direct vision. Throughout the                            procedure, the patient's blood pressure, pulse, and                             oxygen saturations were monitored continuously. The                            [id number]-H190 ([id number]) scope was introduced through the                            mouth, and advanced to the second part of duodenum.                            The upper GI endoscopy was accomplished without                            difficulty. The patient tolerated the procedure                            well. Scope In: 12:55:17 PM Scope Out: 1:02:47 PM Total Procedure Duration: 0 hours 7 minutes 30 seconds  Findings:      The hypopharynx was normal.      The examined esophagus was normal.      The Z-line was irregular and was found 36 cm from the incisors.      Patchy mildly erythematous mucosa without bleeding was found in the       gastric antrum. Biopsies were taken with a cold forceps for histology.       The pathology specimen was placed into Bottle Number 2.      The exam of the stomach was otherwise normal.      The duodenal bulb and second portion of the duodenum were normal.       Biopsies for histology were taken with a cold forceps for evaluation of       celiac disease. The pathology specimen was placed into Bottle Number 1. Impression:               - Normal hypopharynx.                           - Normal esophagus.                           - Z-line irregular, 36 cm from the incisors.                           -  Erythematous mucosa in the antrum. Biopsied.                           - Normal duodenal bulb and second portion of the                            duodenum. Biopsied. Moderate Sedation:      Per Anesthesia Care Recommendation:           - Patient has a contact number available for                            emergencies. The signs and symptoms of potential                            delayed complications were discussed with the                            patient. Return to normal activities tomorrow.                            Written discharge instructions  were provided to the                            patient.                           - Resume previous diet today.                           - Continue present medications.                           - No aspirin, ibuprofen, naproxen, or other                            non-steroidal anti-inflammatory drugs for 1 day.                           - Await pathology results. Procedure Code(s):        --- Professional ---                           [phone], Esophagogastroduodenoscopy, flexible,                            transoral; with biopsy, single or multiple Diagnosis Code(s):        --- Professional ---                           K22.8, Other specified diseases of esophagus                           K31.89, Other diseases of stomach and duodenum                           R10.13, Epigastric pain  R14.0, Abdominal distension (gaseous) CPT copyright 2019 American Medical Association. All rights reserved. The codes documented in this report are preliminary and upon coder review may  be revised to meet current compliance requirements. Lionel December, MD Lionel December, MD 06/03/2020 1:41:43 PM This report has been signed electronically. Number of Addenda: 0

## 2020-06-03 NOTE — H&P (Signed)
[name] is an 64 y.o. female.   Chief Complaint: Patient is here for esophagogastroduodenoscopy and colonoscopy. HPI: Patient is 64 year old Caucasian female who is a history of GERD as well as small intestinal bacterial overgrowth who was seen in the office last week with intractable bloating inability to eat and constipation.  Symptoms started after she took phentermine but she has taken phentermine in the past without any side effects.  Patient was seen in urgent care and given Cipro.  By the time she was seen in the office she was not feeling any better.  She was given prescription for Motegrity but her co-pay was going to be $400 she therefore did not fill the medication.  She says she is less bloated.  She still has not had a good bowel movement.  She is passing little bits and pieces every other day or so.  She denies melena or rectal bleeding.  She was also complaining of epigastric pain when she was seen in the office.  She says his pain is resolved.  Her last colonoscopy was 11 years ago. Family history is negative for CRC.  Past Medical History:  Diagnosis Date  . Anxiety   . Arthritis   . Depression   . GERD (gastroesophageal reflux disease)   . Hypertension   . Hypothyroid   . Hypothyroidism   . PONV (postoperative nausea and vomiting)   . Recurrent upper respiratory infection (URI)   . Urticaria     Past Surgical History:  Procedure Laterality Date  . BACK SURGERY    . CARPAL TUNNEL RELEASE  06/16/2012   Procedure: CARPAL TUNNEL RELEASE;  Surgeon: Roseanne Kaufman, MD;  Location: [facility];  Service: Orthopedics;  Laterality: Right;  limited open carpal tunnel release  . CERVICAL FUSION  2018  . CHOLECYSTECTOMY    . COLONOSCOPY    . TONSILLECTOMY    . TRIGGER FINGER RELEASE  06/16/2012   Procedure: RELEASE TRIGGER FINGER/A-1 PULLEY;  Surgeon: Roseanne Kaufman, MD;  Location: [facility];  Service: Orthopedics;  Laterality: Right;  right middle  a-1 release  . TUBAL LIGATION    . UPPER GASTROINTESTINAL ENDOSCOPY    . VAGINAL HYSTERECTOMY      Family History  Problem Relation Age of Onset  . COPD Mother   . Osteoporosis Mother   . Immunodeficiency Mother   . COPD Father   . Emphysema Father   . Tuberculosis Father   . Lung disease Father   . COPD Sister   . Healthy Daughter   . Healthy Son   . Arthritis Other   . Lung disease Other   . Cancer Other   . Asthma Other   . Eczema Neg Hx   . Atopy Neg Hx   . Angioedema Neg Hx   . Allergic rhinitis Neg Hx    Social History:  reports that she quit smoking about 21 years ago. She started smoking about 48 years ago. She has a 40.50 pack-year smoking history. She has never used smokeless tobacco. She reports that she does not drink alcohol and does not use drugs.  Allergies:  Allergies  Allergen Reactions  . Penicillins Hives    Medications Prior to Admission  Medication Sig Dispense Refill  . albuterol (PROVENTIL) (2.5 MG/3ML) 0.083% nebulizer solution Use via neb q 4 hrs prn wheezing (Patient taking differently: Take 2.5 mg by nebulization every 4 (four) hours as needed for wheezing or shortness of breath. ) 25 mL 0  .  albuterol (VENTOLIN HFA) 108 (90 Base) MCG/ACT inhaler Inhale 1-2 puffs into the lungs every 6 (six) hours as needed for wheezing or shortness of breath. 18 g 0  . budesonide-formoterol (SYMBICORT) 80-4.5 MCG/ACT inhaler Inhale 2 puffs into the lungs 2 (two) times daily. 1 Inhaler 5  . cetirizine (ZYRTEC) 10 MG tablet Take 10 mg by mouth daily as needed for allergies.    . ciprofloxacin (CIPRO) 500 MG tablet Take 1 tablet (500 mg total) by mouth 2 (two) times daily. 60 tablet 0  . fluticasone (FLONASE) 50 MCG/ACT nasal spray Place 2 sprays into both nostrils daily. (Patient taking differently: Place 2 sprays into both nostrils daily as needed for allergies. ) 16 g 5  . gabapentin (NEURONTIN) 100 MG capsule Take 100 mg by mouth at bedtime as needed (back  pain).     Marland Kitchen levothyroxine (SYNTHROID) 150 MCG tablet Take one and a half tablets on Sunday and one tablet all other days (Patient taking differently: Take 150-225 mcg by mouth See admin instructions. Take 150 mcg every day except Sundays take 225 mcg (1.5 tab)) 96 tablet 1  . lisinopril (ZESTRIL) 20 MG tablet TAKE (1) TABLET BY MOUTH ONCE DAILY. (Patient taking differently: Take 20 mg by mouth daily. ) 90 tablet 1  . meloxicam (MOBIC) 7.5 MG tablet Take 7.5 mg by mouth daily.    . mineral oil liquid Take 45 mLs by mouth daily as needed for moderate constipation.    . Naphazoline-Pheniramine (OPCON-A) 0.027-0.315 % SOLN Place 1 drop into both eyes daily.    . pantoprazole (PROTONIX) 40 MG tablet Take 1 tablet (40 mg total) by mouth daily before breakfast. 30 tablet 5  . phentermine 37.5 MG capsule Take 1 capsule (37.5 mg total) by mouth every morning. 30 capsule 2  . PREVIDENT 5000 BOOSTER 1.1 % PSTE Place 1 application onto teeth at bedtime.     . saline (AYR) GEL Place 1 application into both nostrils daily as needed (dryness).    . sertraline (ZOLOFT) 100 MG tablet Take 2 tablets (200 mg total) by mouth daily. 180 tablet 0  . Sodium Phosphates (FLEET ENEMA RE) Place 1 enema rectally daily as needed (constipation).    . valACYclovir (VALTREX) 1000 MG tablet TAKE 1 TABLET BY MOUTH THREE TIMES DAILY FOR 7 DAYS. (Patient taking differently: Take 1,000 mg by mouth daily as needed (fever blisters). Take for 5 days at a time) 21 tablet 3  . ondansetron (ZOFRAN) 4 MG tablet Take 1 tablet (4 mg total) by mouth every 6 (six) hours. (Patient not taking: Reported on 05/28/2020) 12 tablet 0  . Prucalopride Succinate (MOTEGRITY) 1 MG TABS Take 1 mg by mouth daily with breakfast. (Patient not taking: Reported on 05/29/2020) 30 tablet 5    No results found for this or any previous visit (from the past 48 hour(s)). No results found.  Review of Systems  Blood pressure (!) 140/108, temperature 97.6 F (36.4 C),  temperature source Oral, resp. rate 19, SpO2 95 %. Physical Exam HENT:     Mouth/Throat:     Mouth: Mucous membranes are moist.     Pharynx: Oropharynx is clear.  Eyes:     General: No scleral icterus.    Conjunctiva/sclera: Conjunctivae normal.  Cardiovascular:     Rate and Rhythm: Normal rate and regular rhythm.     Heart sounds: Normal heart sounds. No murmur heard.   Pulmonary:     Effort: Pulmonary effort is normal.  Breath sounds: Normal breath sounds.  Abdominal:     Comments: Abdomen is full but soft and nontender with organomegaly or masses.   Musculoskeletal:        General: No swelling.     Cervical back: Neck supple.  Lymphadenopathy:     Cervical: No cervical adenopathy.  Skin:    General: Skin is warm and dry.  Neurological:     Mental Status: She is alert.  Psychiatric:        Mood and Affect: Mood normal.      Assessment/Plan Bloating and epigastric pain. Recent onset of constipation. Diagnostic esophagogastroduodenoscopy and colonoscopy.  Lionel December, MD 06/03/2020, 12:44 PM

## 2020-06-03 NOTE — Anesthesia Postprocedure Evaluation (Signed)
Anesthesia Post Note  Patient: [name]  Procedure(s) Performed: COLONOSCOPY WITH PROPOFOL (N/A ) ESOPHAGOGASTRODUODENOSCOPY (EGD) WITH PROPOFOL (N/A ) BIOPSY  Patient location during evaluation: PACU Anesthesia Type: General Level of consciousness: awake and alert and patient cooperative Pain management: satisfactory to patient Vital Signs Assessment: post-procedure vital signs reviewed and stable Respiratory status: spontaneous breathing Cardiovascular status: stable Postop Assessment: no apparent nausea or vomiting Anesthetic complications: no   No complications documented.   Last Vitals:  Vitals:   06/03/20 1345 06/03/20 1358  BP: 130/90 140/87  Pulse: 81 82  Resp: 18 18  Temp:  36.7 C  SpO2: 98% 98%    Last Pain:  Vitals:   06/03/20 1358  TempSrc: Oral  PainSc: 0-No pain                 Ellakate Gonsalves

## 2020-06-03 NOTE — Anesthesia Preprocedure Evaluation (Signed)
Anesthesia Evaluation  Patient identified by MRN, date of birth, ID band Patient awake    Reviewed: Allergy & Precautions, H&P , NPO status , Patient's Chart, lab work & pertinent test results, reviewed documented beta blocker date and time   History of Anesthesia Complications (+) PONV and history of anesthetic complications  Airway Mallampati: II  TM Distance: >3 FB Neck ROM: full    Dental no notable dental hx. (+) Teeth Intact   Pulmonary Recent URI , Resolved, former smoker,    Pulmonary exam normal breath sounds clear to auscultation       Cardiovascular Exercise Tolerance: Good hypertension, negative cardio ROS   Rhythm:regular Rate:Normal     Neuro/Psych PSYCHIATRIC DISORDERS Anxiety Depression  Neuromuscular disease    GI/Hepatic Neg liver ROS, GERD  Medicated,  Endo/Other  Hypothyroidism   Renal/GU negative Renal ROS  negative genitourinary   Musculoskeletal   Abdominal   Peds  Hematology negative hematology ROS (+)   Anesthesia Other Findings   Reproductive/Obstetrics negative OB ROS                             Anesthesia Physical Anesthesia Plan  ASA: II  Anesthesia Plan: General   Post-op Pain Management:    Induction:   PONV Risk Score and Plan: 3 and Propofol infusion  Airway Management Planned:   Additional Equipment:   Intra-op Plan:   Post-operative Plan:   Informed Consent: I have reviewed the patients History and Physical, chart, labs and discussed the procedure including the risks, benefits and alternatives for the proposed anesthesia with the patient or authorized representative who has indicated his/her understanding and acceptance.     Dental Advisory Given  Plan Discussed with: CRNA  Anesthesia Plan Comments:         Anesthesia Quick Evaluation

## 2020-06-03 NOTE — Op Note (Signed)
[facility] Patient Name: [name] Procedure Date: 06/03/2020 1:05 PM MRN: [id number] Date of Birth: 09-24-1956 Attending MD: Lionel December , MD CSN: [id number] Age: 64 Admit Type: Outpatient Procedure:                Colonoscopy Indications:              Constipation Providers:                Lionel December, MD, Jannett Celestine, RN, Crystal Page,                            Edythe Clarity, Technician Referring MD:             Laroy Apple, DO Medicines:                Propofol per Anesthesia Complications:            No immediate complications. Estimated Blood Loss:     Estimated blood loss: none. Procedure:                Pre-Anesthesia Assessment:                           - Prior to the procedure, a History and Physical                            was performed, and patient medications and                            allergies were reviewed. The patient's tolerance of                            previous anesthesia was also reviewed. The risks                            and benefits of the procedure and the sedation                            options and risks were discussed with the patient.                            All questions were answered, and informed consent                            was obtained. Prior Anticoagulants: The patient has                            taken no previous anticoagulant or antiplatelet                            agents except for NSAID medication. ASA Grade                            Assessment: II - A patient with mild systemic  disease. After reviewing the risks and benefits,                            the patient was deemed in satisfactory condition to                            undergo the procedure.                           After obtaining informed consent, the colonoscope                            was passed under direct vision. Throughout the                            procedure, the patient's blood pressure,  pulse, and                            oxygen saturations were monitored continuously. The                            [id number]-[id number] ([id number]) scope was introduced through                            the anus and advanced to the the cecum, identified                            by appendiceal orifice and ileocecal valve. The                            colonoscopy was performed without difficulty. The                            patient tolerated the procedure well. The quality                            of the bowel preparation was good. The ileocecal                            valve, appendiceal orifice, and rectum were                            photographed. Scope In: 1:06:29 PM Scope Out: 1:23:45 PM Scope Withdrawal Time: 0 hours 10 minutes 37 seconds  Total Procedure Duration: 0 hours 17 minutes 16 seconds  Findings:      The perianal and digital rectal examinations were normal.      Multiple small and large-mouthed diverticula were found in the sigmoid       colon.      The exam was otherwise normal throughout the examined colon.      External hemorrhoids were found during retroflexion. The hemorrhoids       were small. Impression:               - Diverticulosis in the sigmoid colon.                           -  External hemorrhoids.                           - No specimens collected. Moderate Sedation:      Per Anesthesia Care Recommendation:           - Patient has a contact number available for                            emergencies. The signs and symptoms of potential                            delayed complications were discussed with the                            patient. Return to normal activities tomorrow.                            Written discharge instructions were provided to the                            patient.                           - High fiber diet today.                           - Continue present medications.                           - Miralax 17 g po qhs.                            - Metamucil one tablespoon full daily at bedtime.                           - Repeat colonoscopy in 10 years for screening                            purposes.                           - OV in one month. Procedure Code(s):        --- Professional ---                           [phone], Colonoscopy, flexible; diagnostic, including                            collection of specimen(s) by brushing or washing,                            when performed (separate procedure) Diagnosis Code(s):        --- Professional ---                           K64.4, Residual hemorrhoidal skin tags  K59.00, Constipation, unspecified                           K57.30, Diverticulosis of large intestine without                            perforation or abscess without bleeding CPT copyright 2019 American Medical Association. All rights reserved. The codes documented in this report are preliminary and upon coder review may  be revised to meet current compliance requirements. Lionel DecemberNajeeb Shivaan Tierno, MD Lionel DecemberNajeeb Tilia Faso, MD 06/03/2020 1:46:45 PM This report has been signed electronically. Number of Addenda: 0

## 2020-06-03 NOTE — Transfer of Care (Signed)
Immediate Anesthesia Transfer of Care Note  Patient: [name]  Procedure(s) Performed: COLONOSCOPY WITH PROPOFOL (N/A ) ESOPHAGOGASTRODUODENOSCOPY (EGD) WITH PROPOFOL (N/A ) BIOPSY  Patient Location: PACU  Anesthesia Type:General  Level of Consciousness: awake and patient cooperative  Airway & Oxygen Therapy: Patient Spontanous Breathing  Post-op Assessment: Report given to RN and Post -op Vital signs reviewed and stable  Post vital signs: Reviewed and stable  Last Vitals:  Vitals Value Taken Time  BP 119/79 06/03/20 1330  Temp 98   Pulse 84 06/03/20 1330  Resp 18 06/03/20 1330  SpO2 97 % 06/03/20 1330  Vitals shown include unvalidated device data.  Last Pain:  Vitals:   06/03/20 1248  TempSrc:   PainSc: 0-No pain      Patients Stated Pain Goal: 8 (06/03/20 1218)  Complications: No complications documented.

## 2020-06-03 NOTE — Discharge Instructions (Signed)
No aspirin or NSAIDs for 24 hours. Please do not take mineral oil but resume other medications as before Polyethylene glycol 17 g by mouth daily at bedtime. Metamucil 1 heaping tablespoonful daily at bedtime. No driving for 24 hours. Physician will call with biopsy results. Stool diary as to frequency and consistency of stools for 1 month.   Monitored Anesthesia Care, Care After These instructions provide you with information about caring for yourself after your procedure. Your health care provider may also give you more specific instructions. Your treatment has been planned according to current medical practices, but problems sometimes occur. Call your health care provider if you have any problems or questions after your procedure. What can I expect after the procedure? After your procedure, you may:  Feel sleepy for several hours.  Feel clumsy and have poor balance for several hours.  Feel forgetful about what happened after the procedure.  Have poor judgment for several hours.  Feel nauseous or vomit.  Have a sore throat if you had a breathing tube during the procedure. Follow these instructions at home: For at least 24 hours after the procedure:      Have a responsible adult stay with you. It is important to have someone help care for you until you are awake and alert.  Rest as needed.  Do not: ? Participate in activities in which you could fall or become injured. ? Drive. ? Use heavy machinery. ? Drink alcohol. ? Take sleeping pills or medicines that cause drowsiness. ? Make important decisions or sign legal documents. ? Take care of children on your own. Eating and drinking  Follow the diet that is recommended by your health care provider.  If you vomit, drink water, juice, or soup when you can drink without vomiting.  Make sure you have little or no nausea before eating solid foods. General instructions  Take over-the-counter and prescription medicines only  as told by your health care provider.  If you have sleep apnea, surgery and certain medicines can increase your risk for breathing problems. Follow instructions from your health care provider about wearing your sleep device: ? Anytime you are sleeping, including during daytime naps. ? While taking prescription pain medicines, sleeping medicines, or medicines that make you drowsy.  If you smoke, do not smoke without supervision.  Keep all follow-up visits as told by your health care provider. This is important. Contact a health care provider if:  You keep feeling nauseous or you keep vomiting.  You feel light-headed.  You develop a rash.  You have a fever. Get help right away if:  You have trouble breathing. Summary  For several hours after your procedure, you may feel sleepy and have poor judgment.  Have a responsible adult stay with you for at least 24 hours or until you are awake and alert. This information is not intended to replace advice given to you by your health care provider. Make sure you discuss any questions you have with your health care provider. Document Revised: 02/21/2018 Document Reviewed: 03/15/2016 Elsevier Patient Education  2020 [employer].

## 2020-06-04 ENCOUNTER — Other Ambulatory Visit: Payer: Self-pay

## 2020-06-04 LAB — SURGICAL PATHOLOGY

## 2020-06-05 ENCOUNTER — Encounter (HOSPITAL_COMMUNITY): Payer: Self-pay | Admitting: Internal Medicine

## 2020-06-12 ENCOUNTER — Ambulatory Visit (INDEPENDENT_AMBULATORY_CARE_PROVIDER_SITE_OTHER): Payer: PPO | Admitting: Gastroenterology

## 2020-07-08 ENCOUNTER — Other Ambulatory Visit: Payer: Self-pay

## 2020-07-08 ENCOUNTER — Ambulatory Visit
Admission: EM | Admit: 2020-07-08 | Discharge: 2020-07-08 | Disposition: A | Discharge status: Home / Self Care | Payer: PPO | Attending: Emergency Medicine | Admitting: Emergency Medicine

## 2020-07-08 ENCOUNTER — Encounter: Payer: Self-pay | Admitting: Emergency Medicine

## 2020-07-08 DIAGNOSIS — H66002 Acute suppurative otitis media without spontaneous rupture of ear drum, left ear: Secondary | ICD-10-CM | POA: Diagnosis not present

## 2020-07-08 DIAGNOSIS — Z20822 Contact with and (suspected) exposure to covid-19: Secondary | ICD-10-CM | POA: Diagnosis not present

## 2020-07-08 DIAGNOSIS — J069 Acute upper respiratory infection, unspecified: Secondary | ICD-10-CM | POA: Diagnosis not present

## 2020-07-08 MED ORDER — DOXYCYCLINE HYCLATE 100 MG PO CAPS
100.0000 mg | ORAL_CAPSULE | Freq: Two times a day (BID) | ORAL | 0 refills | Status: DC
Start: 2020-07-08 — End: 2021-01-20

## 2020-07-08 NOTE — ED Provider Notes (Signed)
[facility]   [id number] 07/08/20 Arrival Time: 1439   CC: URI  SUBJECTIVE: History from: patient.  [name] is a 64 y.o. female who presents with ear pressure, intermittent dizziness, runny nose, congestion, cough, and fatigue x 6 days.  Admits to recent travel.  Has tried OTC medications without relief.  Denies aggravating factors.  Denies previous symptoms in the past.   Denies fever, chills, SOB, wheezing, chest pain, nausea, changes in bowel or bladder habits.    ROS: As per HPI.  All other pertinent ROS negative.     Past Medical History:  Diagnosis Date   Anxiety    Arthritis    Depression    GERD (gastroesophageal reflux disease)    Hypertension    Hypothyroid    Hypothyroidism    PONV (postoperative nausea and vomiting)    Recurrent upper respiratory infection (URI)    Urticaria    Past Surgical History:  Procedure Laterality Date   BACK SURGERY     BIOPSY  06/03/2020   Procedure: BIOPSY;  Surgeon: Malissa Hippo, MD;  Location: [facility];  Service: Endoscopy;;   CARPAL TUNNEL RELEASE  06/16/2012   Procedure: CARPAL TUNNEL RELEASE;  Surgeon: Dominica Severin, MD;  Location: [facility];  Service: Orthopedics;  Laterality: Right;  limited open carpal tunnel release   CERVICAL FUSION  2018   CHOLECYSTECTOMY     COLONOSCOPY     COLONOSCOPY WITH PROPOFOL N/A 06/03/2020   Procedure: COLONOSCOPY WITH PROPOFOL;  Surgeon: Malissa Hippo, MD;  Location: [facility];  Service: Endoscopy;  Laterality: N/A;  115   ESOPHAGOGASTRODUODENOSCOPY (EGD) WITH PROPOFOL N/A 06/03/2020   Procedure: ESOPHAGOGASTRODUODENOSCOPY (EGD) WITH PROPOFOL;  Surgeon: Malissa Hippo, MD;  Location: [facility];  Service: Endoscopy;  Laterality: N/A;   TONSILLECTOMY     TRIGGER FINGER RELEASE  06/16/2012   Procedure: RELEASE TRIGGER FINGER/A-1 PULLEY;  Surgeon: Dominica Severin, MD;  Location: [facility];  Service: Orthopedics;   Laterality: Right;  right middle a-1 release   TUBAL LIGATION     UPPER GASTROINTESTINAL ENDOSCOPY     VAGINAL HYSTERECTOMY     Allergies  Allergen Reactions   Penicillins Hives   No current facility-administered medications on file prior to encounter.   Current Outpatient Medications on File Prior to Encounter  Medication Sig Dispense Refill   albuterol (PROVENTIL) (2.5 MG/3ML) 0.083% nebulizer solution Use via neb q 4 hrs prn wheezing (Patient taking differently: Take 2.5 mg by nebulization every 4 (four) hours as needed for wheezing or shortness of breath. ) 25 mL 0   albuterol (VENTOLIN HFA) 108 (90 Base) MCG/ACT inhaler Inhale 1-2 puffs into the lungs every 6 (six) hours as needed for wheezing or shortness of breath. 18 g 0   budesonide-formoterol (SYMBICORT) 80-4.5 MCG/ACT inhaler Inhale 2 puffs into the lungs 2 (two) times daily. 1 Inhaler 5   cetirizine (ZYRTEC) 10 MG tablet Take 10 mg by mouth daily as needed for allergies.     fluticasone (FLONASE) 50 MCG/ACT nasal spray Place 2 sprays into both nostrils daily. (Patient taking differently: Place 2 sprays into both nostrils daily as needed for allergies. ) 16 g 5   gabapentin (NEURONTIN) 100 MG capsule Take 100 mg by mouth at bedtime as needed (back pain).      levothyroxine (SYNTHROID) 150 MCG tablet Take one and a half tablets on Sunday and one tablet all other days (Patient taking differently: Take 150-225 mcg  by mouth See admin instructions. Take 150 mcg every day except Sundays take 225 mcg (1.5 tab)) 96 tablet 1   lisinopril (ZESTRIL) 20 MG tablet TAKE (1) TABLET BY MOUTH ONCE DAILY. (Patient taking differently: Take 20 mg by mouth daily. ) 90 tablet 1   meloxicam (MOBIC) 7.5 MG tablet Take 7.5 mg by mouth daily.     Naphazoline-Pheniramine (OPCON-A) 0.027-0.315 % SOLN Place 1 drop into both eyes daily.     pantoprazole (PROTONIX) 40 MG tablet Take 1 tablet (40 mg total) by mouth daily before breakfast. 30 tablet  5   phentermine 37.5 MG capsule Take 1 capsule (37.5 mg total) by mouth every morning. 30 capsule 2   polyethylene glycol powder (GLYCOLAX/MIRALAX) 17 GM/SCOOP powder Take 17 g by mouth daily. 507 g 5   PREVIDENT 5000 BOOSTER 1.1 % PSTE Place 1 application onto teeth at bedtime.      psyllium (METAMUCIL SMOOTH TEXTURE) 58.6 % powder Take 1 packet by mouth at bedtime.     saline (AYR) GEL Place 1 application into both nostrils daily as needed (dryness).     sertraline (ZOLOFT) 100 MG tablet Take 2 tablets (200 mg total) by mouth daily. 180 tablet 0   Sodium Phosphates (FLEET ENEMA RE) Place 1 enema rectally daily as needed (constipation).     valACYclovir (VALTREX) 1000 MG tablet TAKE 1 TABLET BY MOUTH THREE TIMES DAILY FOR 7 DAYS. (Patient taking differently: Take 1,000 mg by mouth daily as needed (fever blisters). Take for 5 days at a time) 21 tablet 3   Social History   Socioeconomic History   Marital status: Married    Spouse name: Not on file   Number of children: Not on file   Years of education: 12   Highest education level: Not on file  Occupational History   Not on file  Tobacco Use   Smoking status: Former Smoker    Packs/day: 1.50    Years: 27.00    Pack years: 40.50    Start date: 69    Quit date: 2000    Years since quitting: 21.6   Smokeless tobacco: Never Used  Building services engineer Use: Never used  Substance and Sexual Activity   Alcohol use: No   Drug use: No   Sexual activity: Not on file  Other Topics Concern   Not on file  Social History Narrative   Not on file   Social Determinants of Health   Financial Resource Strain:    Difficulty of Paying Living Expenses:   Food Insecurity:    Worried About Programme researcher, broadcasting/film/video in the Last Year:    Barista in the Last Year:   Transportation Needs:    Freight forwarder (Medical):    Lack of Transportation (Non-Medical):   Physical Activity:    Days of Exercise per Week:     Minutes of Exercise per Session:   Stress:    Feeling of Stress :   Social Connections:    Frequency of Communication with Friends and Family:    Frequency of Social Gatherings with Friends and Family:    Attends Religious Services:    Active Member of Clubs or Organizations:    Attends Engineer, structural:    Marital Status:   Intimate Partner Violence:    Fear of Current or Ex-Partner:    Emotionally Abused:    Physically Abused:    Sexually Abused:    Family History  Problem Relation Age of Onset   COPD Mother    Osteoporosis Mother    Immunodeficiency Mother    COPD Father    Emphysema Father    Tuberculosis Father    Lung disease Father    COPD Sister    Healthy Daughter    Healthy Son    Arthritis Other    Lung disease Other    Cancer Other    Asthma Other    Eczema Neg Hx    Atopy Neg Hx    Angioedema Neg Hx    Allergic rhinitis Neg Hx     OBJECTIVE:  Vitals:   07/08/20 1457 07/08/20 1458  BP:  (!) 151/94  Pulse:  92  Resp:  18  Temp:  97.9 F (36.6 C)  TempSrc:  Oral  SpO2:  96%  Weight: 170 lb (77.1 kg)   Height: 5\' 1"  (1.549 m)      General appearance: alert; appears fatigued, but nontoxic; speaking in full sentences and tolerating own secretions HEENT: NCAT; Ears: EACs clear, RT TM pearly gray, LT TM mildly injected with cloudy fluids; Eyes: PERRL.  EOM grossly intact. Nose: nares patent without rhinorrhea, Throat: oropharynx clear, tonsils non erythematous or enlarged, uvula midline  Neck: supple without LAD Lungs: unlabored respirations, symmetrical air entry; cough: absent; no respiratory distress; CTAB Heart: regular rate and rhythm.   Skin: warm and dry Psychological: alert and cooperative; normal mood and affect  ASSESSMENT & PLAN:  1. Non-recurrent acute suppurative otitis media of left ear without spontaneous rupture of tympanic membrane   2. Viral URI with cough   3. Suspected COVID-19  virus infection     Meds ordered this encounter  Medications   doxycycline (VIBRAMYCIN) 100 MG capsule    Sig: Take 1 capsule (100 mg total) by mouth 2 (two) times daily.    Dispense:  20 capsule    Refill:  0    Order Specific Question:   Supervising Provider    Answer:   Eustace Moore    COVID testing ordered.  It will take between 2-5 days for test results.  Someone will contact you regarding abnormal results.    In the meantime: You should remain isolated in your home for 10 days from symptom onset AND greater than 72 hours after symptoms resolution (absence of fever without the use of fever-reducing medication and improvement in respiratory symptoms), whichever is longer Get plenty of rest and push fluids Doxycycline prescribed for ear infection Use OTC zyrtec for nasal congestion, runny nose, and/or sore throat Use OTC flonase for nasal congestion and runny nose Use medications daily for symptom relief Use OTC medications like ibuprofen or tylenol as needed fever or pain Call or go to the ED if you have any new or worsening symptoms such as fever, cough, shortness of breath, chest tightness, chest pain, turning blue, changes in mental status, etc...   Reviewed expectations re: course of current medical issues. Questions answered. Outlined signs and symptoms indicating need for more acute intervention. Patient verbalized understanding. After Visit Summary given.         [[id number]], PA-C 07/08/20 1525

## 2020-07-08 NOTE — Discharge Instructions (Addendum)
COVID testing ordered.  It will take between 2-5 days for test results.  Someone will contact you regarding abnormal results.    In the meantime: You should remain isolated in your home for 10 days from symptom onset AND greater than 72 hours after symptoms resolution (absence of fever without the use of fever-reducing medication and improvement in respiratory symptoms), whichever is longer Get plenty of rest and push fluids Doxycycline prescribed for ear infection Use OTC zyrtec for nasal congestion, runny nose, and/or sore throat Use OTC flonase for nasal congestion and runny nose Use medications daily for symptom relief Use OTC medications like ibuprofen or tylenol as needed fever or pain Call or go to the ED if you have any new or worsening symptoms such as fever, cough, shortness of breath, chest tightness, chest pain, turning blue, changes in mental status, etc..Marland Kitchen

## 2020-07-08 NOTE — ED Triage Notes (Addendum)
Pt reports loss of hearing to LT ear that started on Saturday, states she has been having dizzy spells since her ear started to feel like this.

## 2020-07-09 LAB — NOVEL CORONAVIRUS, NAA: SARS-CoV-2, NAA: NOT DETECTED

## 2020-07-09 LAB — SARS-COV-2, NAA 2 DAY TAT

## 2020-07-12 ENCOUNTER — Ambulatory Visit (INDEPENDENT_AMBULATORY_CARE_PROVIDER_SITE_OTHER): Payer: PPO | Admitting: Family Medicine

## 2020-07-12 ENCOUNTER — Encounter: Payer: Self-pay | Admitting: Family Medicine

## 2020-07-12 ENCOUNTER — Other Ambulatory Visit: Payer: Self-pay

## 2020-07-12 VITALS — HR 90 | Temp 100.5°F

## 2020-07-12 DIAGNOSIS — H6690 Otitis media, unspecified, unspecified ear: Secondary | ICD-10-CM

## 2020-07-12 MED ORDER — ONDANSETRON HCL 4 MG PO TABS: 4.0000 mg | ORAL_TABLET | Freq: Three times a day (TID) | ORAL | 0 refills | Status: AC | PRN

## 2020-07-12 MED ORDER — MECLIZINE HCL 12.5 MG PO TABS: 12.5000 mg | ORAL_TABLET | Freq: Three times a day (TID) | ORAL | 0 refills | Status: AC | PRN

## 2020-07-12 MED ORDER — AZITHROMYCIN 250 MG PO TABS
ORAL_TABLET | ORAL | 0 refills | Status: DC
Start: 2020-07-12 — End: 2020-07-31

## 2020-07-12 NOTE — Progress Notes (Signed)
Patient ID: [name], female    DOB: 04-05-1956, 64 y.o.   MRN: [id number]   Chief Complaint  Patient presents with  . Otalgia    cough, congestion, dizziness and nausea all week   Subjective:   Currently on Doxycycline via Urgent care HPI Went to urgent care Monday, 8/2 and diagnosed with acute Otitis media- started Doxycycline on Monday. Now 4.5 days into doxy and still feels ear pain and fullness, with associated symptoms nausea and dizziness. Has PCN allergy with hives.  Medical History [name] has a past medical history of Anxiety, Arthritis, Depression, GERD (gastroesophageal reflux disease), Hypertension, Hypothyroid, Hypothyroidism, PONV (postoperative nausea and vomiting), Recurrent upper respiratory infection (URI), and Urticaria.   Outpatient Encounter Medications as of 07/12/2020  Medication Sig  . albuterol (PROVENTIL) (2.5 MG/3ML) 0.083% nebulizer solution Use via neb q 4 hrs prn wheezing (Patient taking differently: Take 2.5 mg by nebulization every 4 (four) hours as needed for wheezing or shortness of breath. )  . albuterol (VENTOLIN HFA) 108 (90 Base) MCG/ACT inhaler Inhale 1-2 puffs into the lungs every 6 (six) hours as needed for wheezing or shortness of breath.  . budesonide-formoterol (SYMBICORT) 80-4.5 MCG/ACT inhaler Inhale 2 puffs into the lungs 2 (two) times daily.  . cetirizine (ZYRTEC) 10 MG tablet Take 10 mg by mouth daily as needed for allergies.  Marland Kitchen doxycycline (VIBRAMYCIN) 100 MG capsule Take 1 capsule (100 mg total) by mouth 2 (two) times daily.  . fluticasone (FLONASE) 50 MCG/ACT nasal spray Place 2 sprays into both nostrils daily. (Patient taking differently: Place 2 sprays into both nostrils daily as needed for allergies. )  . gabapentin (NEURONTIN) 100 MG capsule Take 100 mg by mouth at bedtime as needed (back pain).   Marland Kitchen levothyroxine (SYNTHROID) 150 MCG tablet Take one and a half tablets on Sunday and one tablet all other days (Patient taking  differently: Take 150-225 mcg by mouth See admin instructions. Take 150 mcg every day except Sundays take 225 mcg (1.5 tab))  . lisinopril (ZESTRIL) 20 MG tablet TAKE (1) TABLET BY MOUTH ONCE DAILY. (Patient taking differently: Take 20 mg by mouth daily. )  . meloxicam (MOBIC) 7.5 MG tablet Take 7.5 mg by mouth daily.  . Naphazoline-Pheniramine (OPCON-A) 0.027-0.315 % SOLN Place 1 drop into both eyes daily.  . pantoprazole (PROTONIX) 40 MG tablet Take 1 tablet (40 mg total) by mouth daily before breakfast.  . phentermine 37.5 MG capsule Take 1 capsule (37.5 mg total) by mouth every morning.  . polyethylene glycol powder (GLYCOLAX/MIRALAX) 17 GM/SCOOP powder Take 17 g by mouth daily.  Marland Kitchen PREVIDENT 5000 BOOSTER 1.1 % PSTE Place 1 application onto teeth at bedtime.   . psyllium (METAMUCIL SMOOTH TEXTURE) 58.6 % powder Take 1 packet by mouth at bedtime.  . saline (AYR) GEL Place 1 application into both nostrils daily as needed (dryness).  . sertraline (ZOLOFT) 100 MG tablet Take 2 tablets (200 mg total) by mouth daily.  . Sodium Phosphates (FLEET ENEMA RE) Place 1 enema rectally daily as needed (constipation).  . valACYclovir (VALTREX) 1000 MG tablet TAKE 1 TABLET BY MOUTH THREE TIMES DAILY FOR 7 DAYS. (Patient taking differently: Take 1,000 mg by mouth daily as needed (fever blisters). Take for 5 days at a time)   No facility-administered encounter medications on file as of 07/12/2020.     Review of Systems  Constitutional: Positive for fever.  HENT: Positive for congestion, ear pain and sore throat.  Fullness in ears L>R     Vitals Pulse 90   Temp (!) 100.5 F (38.1 C) (Skin)   SpO2 94%   Objective:   Physical Exam Constitutional:      Appearance: Normal appearance.  HENT:     Right Ear: Ear canal and external ear normal.     Left Ear: Ear canal and external ear normal.     Ears:     Comments: Right TM erythematous and bulging. Left TM erythematous without bulging.    Nose:  No rhinorrhea.  Eyes:     Pupils: Pupils are equal, round, and reactive to light.  Cardiovascular:     Rate and Rhythm: Normal rate and regular rhythm.     Pulses: Normal pulses.     Heart sounds: Normal heart sounds.  Pulmonary:     Effort: Pulmonary effort is normal.     Breath sounds: Normal breath sounds.  Skin:    General: Skin is warm and dry.  Neurological:     General: No focal deficit present.     Mental Status: She is alert and oriented to person, place, and time.  Psychiatric:        Mood and Affect: Mood normal.        Behavior: Behavior normal.      Assessment and Plan   This patient was seen outside today.   1. Acute otitis media, unspecified otitis media type Went to [facility] Monday and diagnosed with acute Otitis Media and started on Doxycycline at that time. Ears still have pain and fullness after 4.5 days of Doxy. PCN allergy with hives. Patient reports that Z-Pak works well for her in the past.  Also c/o dizziness and some nausea associated with ear infection.  Orders placed: Z-pak 500mg  today, then 250mg  for 4 days Meclizine 12.5mg   By mouth as needed every 8 hours for dizziness Zofran 4mg  by mouth as needed for nausea every 8 hours.  Patient understands that Doxycycline did not improve her symptoms enough after 4.5 days of treatment and antibiotic will be changed to Azithromycin. Stop taking Doxycycline now.  Notify office if symptoms do not improve or you get worse.  , NP

## 2020-07-12 NOTE — Patient Instructions (Signed)
Otitis Media, Adult  Otitis media means that the middle ear is red and swollen (inflamed) and full of fluid. The condition usually goes away on its own. Follow these instructions at home:  Take over-the-counter and prescription medicines only as told by your doctor.  If you were prescribed an antibiotic medicine, take it as told by your doctor. Do not stop taking the antibiotic even if you start to feel better.  Keep all follow-up visits as told by your doctor. This is important. Contact a doctor if:  You have bleeding from your nose.  There is a lump on your neck.  You are not getting better in 5 days.  You feel worse instead of better. Get help right away if:  You have pain that is not helped with medicine.  You have swelling, redness, or pain around your ear.  You get a stiff neck.  You cannot move part of your face (paralyzed).  You notice that the bone behind your ear hurts when you touch it.  You get a very bad headache. Summary  Otitis media means that the middle ear is red, swollen, and full of fluid.  This condition usually goes away on its own. In some cases, treatment may be needed.  If you were prescribed an antibiotic medicine, take it as told by your doctor. This information is not intended to replace advice given to you by your health care provider. Make sure you discuss any questions you have with your health care provider. Document Revised: 11/05/2017 Document Reviewed: 12/14/2016 Elsevier Patient Education  2020 [employer]  

## 2020-07-17 ENCOUNTER — Telehealth: Payer: Self-pay | Admitting: Family Medicine

## 2020-07-17 NOTE — Telephone Encounter (Signed)
Patient was seen 8/6 with ear pain in both ears and now she is having dizziness nausea,throwing unable to sit up without being dizzy. We dont have DPR on file to speak with husband but he is requesting some to see what's going on with his wife and not a car visit. He wants to know why she is so sick and is requesting MRI or CT scan to find out whats going on with her ears. Please advise

## 2020-07-17 NOTE — Telephone Encounter (Signed)
Please advise. Thank you  (No DPR for husband)

## 2020-07-17 NOTE — Telephone Encounter (Signed)
with the severity of the issues I recommend emergency department evaluation-they can urgently do MRI if they suspect a stroke (Scheduled for today, may see Dr. Ladona Ridgel if opening tomorrow)

## 2020-07-17 NOTE — Telephone Encounter (Signed)
Left message to return call  (This morning no DPR in chart, but has one now)

## 2020-07-19 NOTE — Telephone Encounter (Signed)
Left message to return call 

## 2020-07-22 NOTE — Telephone Encounter (Signed)
Patient states she is doing much better and has scheduled an appt with ENT to try to figure out what happened and to see what can be done to prevent another episode.

## 2020-07-22 NOTE — Telephone Encounter (Signed)
Left message to return call 

## 2020-07-31 ENCOUNTER — Other Ambulatory Visit: Payer: Self-pay | Admitting: Family Medicine

## 2020-07-31 ENCOUNTER — Other Ambulatory Visit: Payer: Self-pay

## 2020-07-31 ENCOUNTER — Ambulatory Visit (INDEPENDENT_AMBULATORY_CARE_PROVIDER_SITE_OTHER): Payer: PPO | Admitting: Gastroenterology

## 2020-07-31 ENCOUNTER — Encounter (INDEPENDENT_AMBULATORY_CARE_PROVIDER_SITE_OTHER): Payer: Self-pay | Admitting: Gastroenterology

## 2020-07-31 VITALS — BP 152/77 | HR 76 | Temp 98.1°F | Ht 61.0 in | Wt 177.5 lb

## 2020-07-31 DIAGNOSIS — R140 Abdominal distension (gaseous): Secondary | ICD-10-CM | POA: Diagnosis not present

## 2020-07-31 DIAGNOSIS — K5904 Chronic idiopathic constipation: Secondary | ICD-10-CM | POA: Diagnosis not present

## 2020-07-31 DIAGNOSIS — R1013 Epigastric pain: Secondary | ICD-10-CM | POA: Diagnosis not present

## 2020-07-31 MED ORDER — DICYCLOMINE HCL 10 MG PO CAPS
10.0000 mg | ORAL_CAPSULE | Freq: Three times a day (TID) | ORAL | 1 refills | Status: DC | PRN
Start: 2020-07-31 — End: 2021-07-31

## 2020-07-31 MED ORDER — PANTOPRAZOLE SODIUM 40 MG PO TBEC: 40.0000 mg | DELAYED_RELEASE_TABLET | Freq: Every day | ORAL | 3 refills | Status: DC

## 2020-07-31 NOTE — Patient Instructions (Addendum)
Take mobic w/ food on full stomach. Take protonix ideally about 30 min before meals. Try to decrease coffee as discussed.   Important to take miralax routinely. Can try dicyclomine as needed for diarrhea/abd pain prior to travel, errands, etc.

## 2020-07-31 NOTE — Telephone Encounter (Signed)
Pt states pharmacy told her they sent request 3 times and we have not responded. Pt states she only has 3 pills left.

## 2020-07-31 NOTE — Progress Notes (Signed)
Patient profile: [name] is a 64 y.o. female seen for follow-up of chronic GI issues.  History of Present Illness: [name] is seen today for follow-up.  She had endoscopy colonoscopy about 2 months ago as below.  She reports overall her GI issues are stable. She has some constipation and takes MiraLAX which helps but does report forgetting miralax some days.  Constipation seems to alternate with urgent postprandial diarrhea that can keep her from doing errands, be problematic when traveling.  She denies any blood in stool or severe abdominal pain. Diarrhea tends to be triggered by larger meals or eating out. She does take a fiber supplement a few times a week as well.  She has tried Linzess in the past but it caused her head to hurt.  She was given Motegrity but this was over $400.  She continues to have some epigastric pain, this is a chronic issue, worsens if she overeats.  Also seems to worsen with coffee-she currently drinks 2 pots of coffee a day.  She takes meloxicam with food.  She denies any GERD symptoms, dysphagia, nausea vomiting.  She recently had issues with sinus infection and was on doxycycline and Z-Pak-these did not seem to aggravate her GI issues.  Wt Readings from Last 3 Encounters:  07/31/20 177 lb 8 oz (80.5 kg)  07/08/20 170 lb (77.1 kg)  05/27/20 177 lb (80.3 kg)    06/03/20: Last Colonoscopy: - Diverticulosis in the sigmoid colon. - External hemorrhoids. - No specimens collected.   Last Endoscopy: - Normal hypopharynx. - Normal esophagus. - Z-line irregular, 36 cm from the incisors. - Erythematous mucosa in the antrum. Biopsied. - Normal duodenal bulb and second portion of the duodenum. Biopsied  Biopsy negative for H. pylori infection or eosinophilic gastritis and duodenal biopsy negative for celiac disease   Past Medical History:  Past Medical History:  Diagnosis Date  . Anxiety   . Arthritis   . Depression   . GERD (gastroesophageal  reflux disease)   . Hypertension   . Hypothyroid   . Hypothyroidism   . PONV (postoperative nausea and vomiting)   . Recurrent upper respiratory infection (URI)   . Urticaria     Problem List: Patient Active Problem List   Diagnosis Date Noted  . Abdominal pain, epigastric 10/16/2019  . Abdominal bloating 10/16/2019  . Prediabetes 12/13/2017  . Hyperlipidemia 12/04/2016  . Bereavement 09/15/2016  . Fever blister 06/24/2016  . Rosacea 03/07/2014  . Chronic low back pain 10/22/2013  . Morbid obesity (HCC) 10/03/2013  . Anxiety 08/23/2013  . Depression 05/15/2013  . Fatigue 05/15/2013  . Urinary incontinence in female 02/23/2013  . GERD (gastroesophageal reflux disease) 09/19/2012  . Intestinal bacterial overgrowth 07/25/2012  . Hypertension 07/25/2012  . Hypothyroidism 07/25/2012  . Constipation 07/25/2012  . CTS (carpal tunnel syndrome) 04/21/2012  . Trigger finger 04/21/2012    Past Surgical History: Past Surgical History:  Procedure Laterality Date  . BACK SURGERY    . BIOPSY  06/03/2020   Procedure: BIOPSY;  Surgeon: Malissa Hippo, MD;  Location: [facility];  Service: Endoscopy;;  . CARPAL TUNNEL RELEASE  06/16/2012   Procedure: CARPAL TUNNEL RELEASE;  Surgeon: Dominica Severin, MD;  Location: [facility];  Service: Orthopedics;  Laterality: Right;  limited open carpal tunnel release  . CERVICAL FUSION  2018  . CHOLECYSTECTOMY    . COLONOSCOPY    . COLONOSCOPY WITH PROPOFOL N/A 06/03/2020   Procedure: COLONOSCOPY WITH  PROPOFOL;  Surgeon: Malissa Hippo, MD;  Location: [facility];  Service: Endoscopy;  Laterality: N/A;  115  . ESOPHAGOGASTRODUODENOSCOPY (EGD) WITH PROPOFOL N/A 06/03/2020   Procedure: ESOPHAGOGASTRODUODENOSCOPY (EGD) WITH PROPOFOL;  Surgeon: Malissa Hippo, MD;  Location: [facility];  Service: Endoscopy;  Laterality: N/A;  . TONSILLECTOMY    . TRIGGER FINGER RELEASE  06/16/2012   Procedure: RELEASE TRIGGER FINGER/A-1 PULLEY;   Surgeon: Dominica Severin, MD;  Location: [facility];  Service: Orthopedics;  Laterality: Right;  right middle a-1 release  . TUBAL LIGATION    . UPPER GASTROINTESTINAL ENDOSCOPY    . VAGINAL HYSTERECTOMY      Allergies: Allergies  Allergen Reactions  . Penicillins Hives      Home Medications:  Current Outpatient Medications:  .  albuterol (PROVENTIL) (2.5 MG/3ML) 0.083% nebulizer solution, Use via neb q 4 hrs prn wheezing (Patient taking differently: Take 2.5 mg by nebulization every 4 (four) hours as needed for wheezing or shortness of breath. ), Disp: 25 mL, Rfl: 0 .  albuterol (VENTOLIN HFA) 108 (90 Base) MCG/ACT inhaler, Inhale 1-2 puffs into the lungs every 6 (six) hours as needed for wheezing or shortness of breath., Disp: 18 g, Rfl: 0 .  doxycycline (VIBRAMYCIN) 100 MG capsule, Take 1 capsule (100 mg total) by mouth 2 (two) times daily., Disp: 20 capsule, Rfl: 0 .  fluticasone (FLONASE) 50 MCG/ACT nasal spray, Place 2 sprays into both nostrils daily. (Patient taking differently: Place 2 sprays into both nostrils daily as needed for allergies. ), Disp: 16 g, Rfl: 5 .  gabapentin (NEURONTIN) 100 MG capsule, Take 100 mg by mouth at bedtime as needed (back pain). , Disp: , Rfl:  .  levothyroxine (SYNTHROID) 150 MCG tablet, Take one and a half tablets on Sunday and one tablet all other days (Patient taking differently: Take 150-225 mcg by mouth See admin instructions. Take 150 mcg every day except Sundays take 225 mcg (1.5 tab)), Disp: 96 tablet, Rfl: 1 .  meloxicam (MOBIC) 7.5 MG tablet, Take 7.5 mg by mouth daily., Disp: , Rfl:  .  Naphazoline-Pheniramine (OPCON-A) 0.027-0.315 % SOLN, Place 1 drop into both eyes daily., Disp: , Rfl:  .  pantoprazole (PROTONIX) 40 MG tablet, Take 1 tablet (40 mg total) by mouth daily before breakfast., Disp: 90 tablet, Rfl: 3 .  polyethylene glycol powder (GLYCOLAX/MIRALAX) 17 GM/SCOOP powder, Take 17 g by mouth daily., Disp: 507 g, Rfl: 5 .   PREVIDENT 5000 BOOSTER 1.1 % PSTE, Place 1 application onto teeth at bedtime. , Disp: , Rfl:  .  psyllium (METAMUCIL SMOOTH TEXTURE) 58.6 % powder, Take 1 packet by mouth at bedtime., Disp: , Rfl:  .  saline (AYR) GEL, Place 1 application into both nostrils daily as needed (dryness)., Disp: , Rfl:  .  sertraline (ZOLOFT) 100 MG tablet, Take 2 tablets (200 mg total) by mouth daily., Disp: 180 tablet, Rfl: 0 .  valACYclovir (VALTREX) 1000 MG tablet, TAKE 1 TABLET BY MOUTH THREE TIMES DAILY FOR 7 DAYS. (Patient taking differently: Take 1,000 mg by mouth daily as needed (fever blisters). Take for 5 days at a time), Disp: 21 tablet, Rfl: 3 .  cetirizine (ZYRTEC) 10 MG tablet, Take 10 mg by mouth daily as needed for allergies., Disp: , Rfl:  .  dicyclomine (BENTYL) 10 MG capsule, Take 1 capsule (10 mg total) by mouth 3 (three) times daily as needed (abd pain, diarrhea)., Disp: 60 capsule, Rfl: 1 .  lisinopril (ZESTRIL)  20 MG tablet, TAKE (1) TABLET BY MOUTH ONCE DAILY., Disp: 90 tablet, Rfl: 0 .  phentermine 37.5 MG capsule, Take 1 capsule (37.5 mg total) by mouth every morning. (Patient not taking: Reported on 07/31/2020), Disp: 30 capsule, Rfl: 2 .  Sodium Phosphates (FLEET ENEMA RE), Place 1 enema rectally daily as needed (constipation)., Disp: , Rfl:    Family History: family history includes Arthritis in an other family member; Asthma in an other family member; COPD in her father, mother, and sister; Cancer in an other family member; Emphysema in her father; Healthy in her daughter and son; Immunodeficiency in her mother; Lung disease in her father and another family member; Osteoporosis in her mother; Tuberculosis in her father.    Social History:   reports that she quit smoking about 21 years ago. She started smoking about 48 years ago. She has a 40.50 pack-year smoking history. She has never used smokeless tobacco. She reports that she does not drink alcohol and does not use drugs.   Review of  Systems: Constitutional: Denies weight loss/weight gain  Eyes: No changes in vision. ENT: No oral lesions, sore throat.  GI: see HPI.  Heme/Lymph: No easy bruising.  CV: No chest pain.  GU: No hematuria.  Integumentary: No rashes.  Neuro: No headaches.  Psych: No depression/anxiety.  Endocrine: No heat/cold intolerance.  Allergic/Immunologic: No urticaria.  Resp: No cough, SOB.  Musculoskeletal: No joint swelling.    Physical Examination: BP (!) 152/77 (BP Location: Right Arm, Patient Position: Sitting, Cuff Size: Large)   Pulse 76   Temp 98.1 F (36.7 C) (Oral)   Ht 5\' 1"  (1.549 m)   Wt 177 lb 8 oz (80.5 kg)   BMI 33.54 kg/m  Gen: NAD, alert and oriented x 4 HEENT: PEERLA, EOMI, Neck: supple, no JVD Chest: CTA bilaterally, no wheezes, crackles, or other adventitious sounds CV: RRR, no m/g/c/r Abd: soft, NT, ND, +BS in all four quadrants; no HSM, guarding, ridigity, or rebound tenderness Ext: no edema, well perfused with 2+ pulses, Skin: no rash or lesions noted on observed skin Lymph: no noted LAD  Data Reviewed:   05/08/20-CMP normal  Assessment/Plan: Ms. Winkowski is a 64 y.o. female   1.  IBS and history of SIBO.  She uses Cipro on an as-needed basis for severe bloating.  Routinely uses MiraLAX which seems to help constipation when she remembers, addressed importance of routine use today.  She also has urgent loose diarrhea after eating out which problematic with traveling, errands, etc.  She would like to try dicyclomine on an as-needed basis when traveling.  She reports trying probiotics without improvement.  No improvement with and had side effects with Linzess.  2.  Epigastric pain-had H. pylori negative gastritis on endoscopy.  She is currently taking her PPI after food, discussed taking 30 as before.  She will try to decrease her coffee intake (2 pots/day) which exacerbates symptoms.  Reviewed taking Mobic with food.  She denies any other OTC NSAID use.  She will  follow-up in 6 months if doing well, call sooner if needed.  Khadijatou was seen today for follow-up.  Diagnoses and all orders for this visit:  Abdominal bloating  Chronic idiopathic constipation  Abdominal pain, epigastric  Other orders -     dicyclomine (BENTYL) 10 MG capsule; Take 1 capsule (10 mg total) by mouth 3 (three) times daily as needed (abd pain, diarrhea). -     pantoprazole (PROTONIX) 40 MG tablet; Take 1 tablet (40  mg total) by mouth daily before breakfast.    25 min face to face time, >50% face to face    I personally performed the service, non-incident to. (WP)  Tawni PummelJanice Tam Delisle, [facility]

## 2020-08-08 NOTE — Addendum Note (Signed)
Addended by: Malissa Hippo on: 08/08/2020 05:13 PM   Modules accepted: Orders

## 2020-08-14 ENCOUNTER — Other Ambulatory Visit: Payer: Self-pay

## 2020-08-14 ENCOUNTER — Encounter (INDEPENDENT_AMBULATORY_CARE_PROVIDER_SITE_OTHER): Payer: Self-pay | Admitting: Otolaryngology

## 2020-08-14 ENCOUNTER — Ambulatory Visit (INDEPENDENT_AMBULATORY_CARE_PROVIDER_SITE_OTHER): Payer: PPO | Admitting: Otolaryngology

## 2020-08-14 VITALS — Temp 97.3°F

## 2020-08-14 DIAGNOSIS — H8102 Meniere's disease, left ear: Secondary | ICD-10-CM

## 2020-08-14 NOTE — Progress Notes (Signed)
HPI: [name] is a 64 y.o. female who returns today for evaluation of vertigo attacks that she had in July.  She states that she had 3 episodes of vertigo in July that lasted about an hour.  She did not notice any change in her hearing.  However she had a hearing test performed on March 5 of this year that demonstrated a low-frequency SNHL in the left ear compared to the right that is compatible with possible Mnire's disease.  She has had no further dizzy episodes or vertigo since July. She does have history of hypertension for which she takes lisinopril..  Past Medical History:  Diagnosis Date  . Anxiety   . Arthritis   . Depression   . GERD (gastroesophageal reflux disease)   . Hypertension   . Hypothyroid   . Hypothyroidism   . PONV (postoperative nausea and vomiting)   . Recurrent upper respiratory infection (URI)   . Urticaria    Past Surgical History:  Procedure Laterality Date  . BACK SURGERY    . BIOPSY  06/03/2020   Procedure: BIOPSY;  Surgeon: Malissa Hippo, MD;  Location: [facility];  Service: Endoscopy;;  . CARPAL TUNNEL RELEASE  06/16/2012   Procedure: CARPAL TUNNEL RELEASE;  Surgeon: Dominica Severin, MD;  Location: [facility];  Service: Orthopedics;  Laterality: Right;  limited open carpal tunnel release  . CERVICAL FUSION  2018  . CHOLECYSTECTOMY    . COLONOSCOPY    . COLONOSCOPY WITH PROPOFOL N/A 06/03/2020   Procedure: COLONOSCOPY WITH PROPOFOL;  Surgeon: Malissa Hippo, MD;  Location: [facility];  Service: Endoscopy;  Laterality: N/A;  115  . ESOPHAGOGASTRODUODENOSCOPY (EGD) WITH PROPOFOL N/A 06/03/2020   Procedure: ESOPHAGOGASTRODUODENOSCOPY (EGD) WITH PROPOFOL;  Surgeon: Malissa Hippo, MD;  Location: [facility];  Service: Endoscopy;  Laterality: N/A;  . TONSILLECTOMY    . TRIGGER FINGER RELEASE  06/16/2012   Procedure: RELEASE TRIGGER FINGER/A-1 PULLEY;  Surgeon: Dominica Severin, MD;  Location: [facility];  Service:  Orthopedics;  Laterality: Right;  right middle a-1 release  . TUBAL LIGATION    . UPPER GASTROINTESTINAL ENDOSCOPY    . VAGINAL HYSTERECTOMY     Social History   Socioeconomic History  . Marital status: Married    Spouse name: Not on file  . Number of children: Not on file  . Years of education: 54  . Highest education level: Not on file  Occupational History  . Not on file  Tobacco Use  . Smoking status: Former Smoker    Packs/day: 1.50    Years: 27.00    Pack years: 40.50    Start date: 1973    Quit date: 2000    Years since quitting: 21.7  . Smokeless tobacco: Never Used  Vaping Use  . Vaping Use: Never used  Substance and Sexual Activity  . Alcohol use: No  . Drug use: No  . Sexual activity: Not on file  Other Topics Concern  . Not on file  Social History Narrative  . Not on file   Social Determinants of Health   Financial Resource Strain:   . Difficulty of Paying Living Expenses: Not on file  Food Insecurity:   . Worried About Programme researcher, broadcasting/film/video in the Last Year: Not on file  . Ran Out of Food in the Last Year: Not on file  Transportation Needs:   . Lack of Transportation (Medical): Not on file  . Lack of  Transportation (Non-Medical): Not on file  Physical Activity:   . Days of Exercise per Week: Not on file  . Minutes of Exercise per Session: Not on file  Stress:   . Feeling of Stress : Not on file  Social Connections:   . Frequency of Communication with Friends and Family: Not on file  . Frequency of Social Gatherings with Friends and Family: Not on file  . Attends Religious Services: Not on file  . Active Member of Clubs or Organizations: Not on file  . Attends Banker Meetings: Not on file  . Marital Status: Not on file   Family History  Problem Relation Age of Onset  . COPD Mother   . Osteoporosis Mother   . Immunodeficiency Mother   . COPD Father   . Emphysema Father   . Tuberculosis Father   . Lung disease Father   . COPD  Sister   . Healthy Daughter   . Healthy Son   . Arthritis Other   . Lung disease Other   . Cancer Other   . Asthma Other   . Eczema Neg Hx   . Atopy Neg Hx   . Angioedema Neg Hx   . Allergic rhinitis Neg Hx    Allergies  Allergen Reactions  . Penicillins Hives   Prior to Admission medications   Medication Sig Start Date End Date Taking? Authorizing Provider  albuterol (PROVENTIL) (2.5 MG/3ML) 0.083% nebulizer solution Use via neb q 4 hrs prn wheezing Patient taking differently: Take 2.5 mg by nebulization every 4 (four) hours as needed for wheezing or shortness of breath.  02/27/20  Yes Merlyn Albert, MD  albuterol (VENTOLIN HFA) 108 (90 Base) MCG/ACT inhaler Inhale 1-2 puffs into the lungs every 6 (six) hours as needed for wheezing or shortness of breath. 03/11/20  Yes Avegno, Zachery Dakins, FNP  dicyclomine (BENTYL) 10 MG capsule Take 1 capsule (10 mg total) by mouth 3 (three) times daily as needed (abd pain, diarrhea). 07/31/20  Yes Tawni Pummel A, PA-C  doxycycline (VIBRAMYCIN) 100 MG capsule Take 1 capsule (100 mg total) by mouth 2 (two) times daily. 07/08/20  Yes Wurst, [place], PA-C  fluticasone (FLONASE) 50 MCG/ACT nasal spray Place 2 sprays into both nostrils daily. Patient taking differently: Place 2 sprays into both nostrils daily as needed for allergies.  04/12/20  Yes Alfonse Spruce, MD  gabapentin (NEURONTIN) 100 MG capsule Take 100 mg by mouth at bedtime as needed (back pain).    Yes [provider]  levothyroxine (SYNTHROID) 150 MCG tablet Take one and a half tablets on Sunday and one tablet all other days Patient taking differently: Take 150-225 mcg by mouth See admin instructions. Take 150 mcg every day except Sundays take 225 mcg (1.5 tab) 12/28/19  Yes Merlyn Albert, MD  lisinopril (ZESTRIL) 20 MG tablet TAKE (1) TABLET BY MOUTH ONCE DAILY. 07/31/20  Yes Ladona Ridgel, Malena M, DO  meloxicam (MOBIC) 7.5 MG tablet Take 7.5 mg by mouth daily.   Yes [provider]  Naphazoline-Pheniramine (OPCON-A) 0.027-0.315 % SOLN Place 1 drop into both eyes daily.   Yes [provider]  pantoprazole (PROTONIX) 40 MG tablet Take 1 tablet (40 mg total) by mouth daily before breakfast. 07/31/20  Yes Tawni Pummel A, PA-C  polyethylene glycol powder (GLYCOLAX/MIRALAX) 17 GM/SCOOP powder Take 17 g by mouth daily. 06/03/20  Yes Rehman, Joline Maxcy, MD  PREVIDENT 5000 BOOSTER 1.1 % PSTE Place 1 application onto teeth at bedtime.  10/06/19  Yes [provider]  psyllium (METAMUCIL SMOOTH TEXTURE) 58.6 % powder Take 1 packet by mouth at bedtime. 06/03/20  Yes Rehman, Joline Maxcy, MD  saline (AYR) GEL Place 1 application into both nostrils daily as needed (dryness).   Yes [provider]  sertraline (ZOLOFT) 100 MG tablet Take 2 tablets (200 mg total) by mouth daily. 05/13/20  Yes Taylor, Malena M, DO  valACYclovir (VALTREX) 1000 MG tablet TAKE 1 TABLET BY MOUTH THREE TIMES DAILY FOR 7 DAYS. Patient taking differently: Take 1,000 mg by mouth daily as needed (fever blisters). Take for 5 days at a time 03/25/20  Yes Luking, Vilinda Blanks, MD     Positive ROS: Otherwise negative  All other systems have been reviewed and were otherwise negative with the exception of those mentioned in the HPI and as above.  Physical Exam: Constitutional: Alert, well-appearing, no acute distress Ears: External ears without lesions or tenderness. Ear canals with minimal wax buildup in both ear canals that was cleaned in the office with forceps.  Ear canals and TMs are otherwise clear.  She has no evidence of BPPV on Dix-Hallpike testing. Nasal: External nose without lesions. Clear nasal passages Oral: Lips and gums without lesions. Tongue and palate mucosa without lesions. Posterior oropharynx clear. Neck: No palpable adenopathy or masses Respiratory: Breathing comfortably  Skin: No facial/neck lesions or rash noted.  Procedures  Assessment: 3 episodes of vertigo  in July consistent with possible left ear Mnire's disease as audiologic testing in March demonstrated low-frequency hearing loss in the left ear.  Plan: Patient has had no other episodes of vertigo or noted any change in her hearing.  Reviewed with her that this could represent Mnire's disease and would suggest a low-salt diet.  If she continues to have attacks of vertigo or dizziness consider placing her on a diuretic.   Narda Bonds, MD

## 2020-08-15 ENCOUNTER — Encounter (INDEPENDENT_AMBULATORY_CARE_PROVIDER_SITE_OTHER): Payer: Self-pay

## 2020-08-28 ENCOUNTER — Ambulatory Visit: Payer: PPO | Admitting: Allergy & Immunology

## 2020-09-25 ENCOUNTER — Other Ambulatory Visit: Payer: Self-pay | Admitting: *Deleted

## 2020-09-25 NOTE — Telephone Encounter (Signed)
Consuella Lose from [facility] calling to get refill on prevident tooth paste. Last med check up was 05/13/20.  Pharm fax number [phone] Pharm phone number [phone]

## 2020-10-01 DIAGNOSIS — B001 Herpesviral vesicular dermatitis: Secondary | ICD-10-CM | POA: Diagnosis not present

## 2020-10-01 DIAGNOSIS — L718 Other rosacea: Secondary | ICD-10-CM | POA: Diagnosis not present

## 2020-10-02 ENCOUNTER — Other Ambulatory Visit: Payer: PPO

## 2020-10-02 ENCOUNTER — Ambulatory Visit (INDEPENDENT_AMBULATORY_CARE_PROVIDER_SITE_OTHER): Payer: PPO | Admitting: Family Medicine

## 2020-10-02 ENCOUNTER — Other Ambulatory Visit: Payer: Self-pay

## 2020-10-02 ENCOUNTER — Encounter: Payer: Self-pay | Admitting: Family Medicine

## 2020-10-02 VITALS — BP 130/78 | HR 68 | Temp 97.4°F | Ht 61.0 in | Wt 176.8 lb

## 2020-10-02 DIAGNOSIS — Z23 Encounter for immunization: Secondary | ICD-10-CM | POA: Diagnosis not present

## 2020-10-02 DIAGNOSIS — E039 Hypothyroidism, unspecified: Secondary | ICD-10-CM

## 2020-10-02 DIAGNOSIS — R5383 Other fatigue: Secondary | ICD-10-CM | POA: Diagnosis not present

## 2020-10-02 DIAGNOSIS — E7849 Other hyperlipidemia: Secondary | ICD-10-CM | POA: Diagnosis not present

## 2020-10-02 NOTE — Patient Instructions (Signed)
Preventing Influenza, Adult Influenza, more commonly known as "the flu," is a viral infection that mainly affects the respiratory tract. The respiratory tract includes structures that help you breathe, such as the lungs, nose, and throat. The flu causes many common cold symptoms, as well as a high fever and body aches. The flu spreads easily from person to person (is contagious). The flu is most common from December through March. This is called flu season.You can catch the flu virus by:  Breathing in droplets from an infected person's cough or sneeze.  Touching something that was recently contaminated with the virus and then touching your mouth, nose, or eyes. What can I do to lower my risk?        You can decrease your risk of getting the flu by:  Getting a flu shot (influenza vaccination) every year. This is the best way to prevent the flu. A flu shot is recommended for everyone age 6 months and older. ? It is best to get a flu shot in the fall, as soon as it is available. Getting a flu shot during winter or spring instead is still a good idea. Flu season can last into early spring. ? Preventing the flu through vaccination requires getting a new flu shot every year. This is because the flu virus changes slightly (mutates) from one year to the next. Even if a flu shot does not completely protect you from all flu virus mutations, it can reduce the severity of your illness and prevent dangerous complications of the flu. ? If you are pregnant, you can and should get a flu shot. ? If you have had a reaction to the shot in the past or if you are allergic to eggs, check with your health care provider before getting a flu shot. ? Sometimes the vaccine is available as a nasal spray. In some years, the nasal spray has not been as effective against the flu virus. Check with your health care provider if you have questions about this.  Practicing good health habits. This is especially important during  flu season. ? Avoid contact with people who are sick with flu or cold symptoms. ? Wash your hands with soap and water often. If soap and water are not available, use alcohol-based hand sanitizer. ? Avoid touching your hands to your face, especially when you have not washed your hands recently. ? Use a disinfectant to clean surfaces at home and at work that may be contaminated with the flu virus. ? Keep your body's disease-fighting system (immune system) in good shape by eating a healthy diet, drinking plenty of fluids, getting enough sleep, and exercising regularly. If you do get the flu, avoid spreading it to others by:  Staying home until your symptoms have been gone for at least one day.  Covering your mouth and nose when you cough or sneeze.  Avoiding close contact with others, especially babies and elderly people. Why are these changes important? Getting a flu shot and practicing good health habits protects you as well as other people. If you get the flu, your friends, family, and co-workers are also at risk of getting it, because it spreads so easily to others. Each year, about 2 out of every 10 people get the flu. Having the flu can lead to complications, such as pneumonia, ear infection, and sinus infection. The flu also can be deadly, especially for babies, people older than age 65, and people who have serious long-term diseases. How is this treated? Most   people recover from the flu by resting at home and drinking plenty of fluids. However, a prescription antiviral medicine may reduce your flu symptoms and may make your flu go away sooner. This medicine must be started within a few days of getting flu symptoms. You can talk with your health care provider about whether you need an antiviral medicine. Antiviral medicine may be prescribed for people who are at risk for more serious flu symptoms. This includes people who:  Are older than age 65.  Are pregnant.  Have a condition that  makes the flu worse or more dangerous. Where to find more information  Centers for Disease Control and Prevention: www.cdc.gov/flu/index.htm  Flu.gov: www.flu.gov/prevention-vaccination  American Academy of Family Physicians: familydoctor.org/familydoctor/en/kids/vaccines/preventing-the-flu.html Contact a health care provider if:  You have influenza and you develop new symptoms.  You have: ? Chest pain. ? Diarrhea. ? A fever.  Your cough gets worse, or you produce more mucus. Summary  The best way to prevent the flu is to get a flu shot every year in the fall.  Even if you get the flu after you have received the yearly vaccine, your flu may be milder and go away sooner because of your flu shot.  If you get the flu, antiviral medicines that are started with a few days of symptoms may reduce your flu symptoms and may make your flu go away sooner.  You can also help prevent the flu by practicing good health habits. This information is not intended to replace advice given to you by your health care provider. Make sure you discuss any questions you have with your health care provider. Document Revised: 11/05/2017 Document Reviewed: 08/01/2016 Elsevier Patient Education  2020 Elsevier Inc.  

## 2020-10-02 NOTE — Progress Notes (Signed)
Pt here for fatigue. Sleeping 10-12 hours. No energy. Mom had to have B12 injections and pt is wondering if low B12 is hereditary. Pt states she has always dealt with fatigue but has gradually worsened. Pt also is confused. Pt states she has become increasingly confused. She will go in a room to do one thing but will go to another project without completion of first project. Pt states she is "always sick with some kind of infection". Unable to take vitamins due to thyroid.     Patient ID: [name], female    DOB: August 17, 1956, 64 y.o.   MRN: [id number]   Chief Complaint  Patient presents with  . Fatigue   Subjective:  CC: fatigue, no energy for 5-6 months  Presents today with a complaint of being fatigued, tired, having no energy, sleeping 10 to 12 hours/day, forgetful when she goes into the room why she was there, this has been going on for 5 to 6 months.  She denies any fever chills headaches occasionally has shortness of breath but this is not a new problem for her she sees pulmonology for her breathing problems.  No abdominal pain no urinary symptoms she states "just do not feel good ".  Her last CBC was done October 16, 2019 and there was no anemia found.    Medical History Alegra has a past medical history of Anxiety, Arthritis, Depression, GERD (gastroesophageal reflux disease), Hypertension, Hypothyroid, Hypothyroidism, PONV (postoperative nausea and vomiting), Recurrent upper respiratory infection (URI), and Urticaria.   Outpatient Encounter Medications as of 10/02/2020  Medication Sig  . albuterol (PROVENTIL) (2.5 MG/3ML) 0.083% nebulizer solution Use via neb q 4 hrs prn wheezing (Patient taking differently: Take 2.5 mg by nebulization every 4 (four) hours as needed for wheezing or shortness of breath. )  . albuterol (VENTOLIN HFA) 108 (90 Base) MCG/ACT inhaler Inhale 1-2 puffs into the lungs every 6 (six) hours as needed for wheezing or shortness of breath.  . dicyclomine  (BENTYL) 10 MG capsule Take 1 capsule (10 mg total) by mouth 3 (three) times daily as needed (abd pain, diarrhea).  . doxycycline (VIBRAMYCIN) 100 MG capsule Take 1 capsule (100 mg total) by mouth 2 (two) times daily.  . fluticasone (FLONASE) 50 MCG/ACT nasal spray Place 2 sprays into both nostrils daily. (Patient taking differently: Place 2 sprays into both nostrils daily as needed for allergies. )  . gabapentin (NEURONTIN) 100 MG capsule Take 100 mg by mouth at bedtime as needed (back pain).   Marland Kitchen levothyroxine (SYNTHROID) 150 MCG tablet Take one and a half tablets on Sunday and one tablet all other days (Patient taking differently: Take 150-225 mcg by mouth See admin instructions. Take 150 mcg every day except Sundays take 225 mcg (1.5 tab))  . lisinopril (ZESTRIL) 20 MG tablet TAKE (1) TABLET BY MOUTH ONCE DAILY.  . meloxicam (MOBIC) 7.5 MG tablet Take 7.5 mg by mouth daily.  . Naphazoline-Pheniramine (OPCON-A) 0.027-0.315 % SOLN Place 1 drop into both eyes daily.  . pantoprazole (PROTONIX) 40 MG tablet Take 1 tablet (40 mg total) by mouth daily before breakfast.  . polyethylene glycol powder (GLYCOLAX/MIRALAX) 17 GM/SCOOP powder Take 17 g by mouth daily.  Marland Kitchen PREVIDENT 5000 BOOSTER 1.1 % PSTE Place 1 application onto teeth at bedtime.   . psyllium (METAMUCIL SMOOTH TEXTURE) 58.6 % powder Take 1 packet by mouth at bedtime.  . saline (AYR) GEL Place 1 application into both nostrils daily as needed (dryness).  . sertraline (ZOLOFT) 100  MG tablet Take 2 tablets (200 mg total) by mouth daily.  . valACYclovir (VALTREX) 1000 MG tablet TAKE 1 TABLET BY MOUTH THREE TIMES DAILY FOR 7 DAYS. (Patient taking differently: Take 1,000 mg by mouth daily as needed (fever blisters). Take for 5 days at a time)   No facility-administered encounter medications on file as of 10/02/2020.     Review of Systems  Constitutional: Positive for fatigue. Negative for chills and fever.  Respiratory: Positive for shortness of  breath.        Not new, sees pulmonary.  Gastrointestinal: Negative for abdominal pain.     Vitals BP 130/78   Pulse 68   Temp (!) 97.4 F (36.3 C)   Ht 5\' 1"  (1.549 m)   Wt 176 lb 12.8 oz (80.2 kg)   SpO2 97%   BMI 33.41 kg/m   Objective:   Physical Exam Vitals and nursing note reviewed.  Constitutional:      Appearance: Normal appearance.  Cardiovascular:     Rate and Rhythm: Normal rate and regular rhythm.     Heart sounds: Normal heart sounds.  Pulmonary:     Effort: Pulmonary effort is normal.     Breath sounds: Normal breath sounds.  Abdominal:     General: Bowel sounds are normal.     Palpations: Abdomen is soft.  Skin:    General: Skin is warm and dry.  Neurological:     Mental Status: She is alert and oriented to person, place, and time.  Psychiatric:        Mood and Affect: Mood normal.        Behavior: Behavior normal.        Thought Content: Thought content normal.        Judgment: Judgment normal.      Assessment and Plan   1. Other fatigue - CBC with Differential - TSH - Comprehensive Metabolic Panel (CMET) - Lipid Profile  2. Hypothyroidism, unspecified type - CBC with Differential - TSH - Comprehensive Metabolic Panel (CMET) - Lipid Profile  3. Other hyperlipidemia - CBC with Differential - TSH - Comprehensive Metabolic Panel (CMET) - Lipid Profile   It has been a while since He has had a complete physical exam with labs.  Due to the nature of her complaint, lab  work will be ordered today, and she will schedule for an annual physical exam.  She does have issues with her thyroid, and it was noted that her last TSH was done 11 months ago and was significantly different than the one 1 month prior.  Her fatigue could be related to her thyroid, anemia, or some vitamin deficiency.  She does take Zoloft, feels that this is effective, and denies depression today.  Agrees with plan of care discussed today. Understands warning signs to seek  further care: Feelings of self-harm, any significant changes in health status. Understands to follow-up in 2 to 3 weeks for an annual physical exam, getting lab work done 1 week prior to that visit.  Labs will be reviewed in detail at the next visit, any concerning values will be reported as soon as available.   , NP 10/02/2020

## 2020-10-03 DIAGNOSIS — E7849 Other hyperlipidemia: Secondary | ICD-10-CM | POA: Diagnosis not present

## 2020-10-03 DIAGNOSIS — R5383 Other fatigue: Secondary | ICD-10-CM | POA: Diagnosis not present

## 2020-10-03 DIAGNOSIS — E039 Hypothyroidism, unspecified: Secondary | ICD-10-CM | POA: Diagnosis not present

## 2020-10-04 LAB — CBC WITH DIFFERENTIAL/PLATELET
Basophils Absolute: 0.1 10*3/uL (ref 0.0–0.2)
Basos: 1 %
EOS (ABSOLUTE): 0.2 10*3/uL (ref 0.0–0.4)
Eos: 3 %
Hematocrit: 45.5 % (ref 34.0–46.6)
Hemoglobin: 15.1 g/dL (ref 11.1–15.9)
Immature Grans (Abs): 0 10*3/uL (ref 0.0–0.1)
Immature Granulocytes: 0 %
Lymphocytes Absolute: 1.9 10*3/uL (ref 0.7–3.1)
Lymphs: 27 %
MCH: 31.9 pg (ref 26.6–33.0)
MCHC: 33.2 g/dL (ref 31.5–35.7)
MCV: 96 fL (ref 79–97)
Monocytes Absolute: 0.6 10*3/uL (ref 0.1–0.9)
Monocytes: 10 %
Neutrophils Absolute: 4 10*3/uL (ref 1.4–7.0)
Neutrophils: 59 %
Platelets: 228 10*3/uL (ref 150–450)
RBC: 4.73 x10E6/uL (ref 3.77–5.28)
RDW: 13.9 % (ref 11.7–15.4)
WBC: 6.8 10*3/uL (ref 3.4–10.8)

## 2020-10-04 LAB — COMPREHENSIVE METABOLIC PANEL
ALT: 31 IU/L (ref 0–32)
AST: 24 IU/L (ref 0–40)
Albumin/Globulin Ratio: 1.5 (ref 1.2–2.2)
Albumin: 4.5 g/dL (ref 3.8–4.8)
Alkaline Phosphatase: 69 IU/L (ref 44–121)
BUN/Creatinine Ratio: 17 (ref 12–28)
BUN: 15 mg/dL (ref 8–27)
Bilirubin Total: 0.3 mg/dL (ref 0.0–1.2)
CO2: 25 mmol/L (ref 20–29)
Calcium: 9.9 mg/dL (ref 8.7–10.3)
Chloride: 102 mmol/L (ref 96–106)
Creatinine, Ser: 0.9 mg/dL (ref 0.57–1.00)
GFR calc Af Amer: 79 mL/min/{1.73_m2} (ref >59)
GFR calc non Af Amer: 68 mL/min/{1.73_m2} (ref >59)
Globulin, Total: 3.1 g/dL (ref 1.5–4.5)
Glucose: 104 mg/dL — ABNORMAL HIGH (ref 65–99)
Potassium: 4.8 mmol/L (ref 3.5–5.2)
Sodium: 141 mmol/L (ref 134–144)
Total Protein: 7.6 g/dL (ref 6.0–8.5)

## 2020-10-04 LAB — LIPID PANEL
Chol/HDL Ratio: 3.8 ratio (ref 0.0–4.4)
Cholesterol, Total: 250 mg/dL — ABNORMAL HIGH (ref 100–199)
HDL: 66 mg/dL (ref >39)
LDL Chol Calc (NIH): 166 mg/dL — ABNORMAL HIGH (ref 0–99)
Triglycerides: 101 mg/dL (ref 0–149)
VLDL Cholesterol Cal: 18 mg/dL (ref 5–40)

## 2020-10-04 LAB — TSH: TSH: 1.18 u[IU]/mL (ref 0.450–4.500)

## 2020-10-09 ENCOUNTER — Other Ambulatory Visit: Payer: Self-pay | Admitting: *Deleted

## 2020-10-09 MED ORDER — SODIUM FLUORIDE 1.1 % DT PSTE
1.0000 | PASTE | Freq: Every day | DENTAL | 3 refills | Status: DC
Start: 2020-10-09 — End: 2024-06-05

## 2020-10-10 ENCOUNTER — Ambulatory Visit (INDEPENDENT_AMBULATORY_CARE_PROVIDER_SITE_OTHER): Payer: PPO | Admitting: Family Medicine

## 2020-10-10 ENCOUNTER — Encounter: Payer: Self-pay | Admitting: Family Medicine

## 2020-10-10 ENCOUNTER — Other Ambulatory Visit: Payer: Self-pay

## 2020-10-10 VITALS — BP 132/80 | HR 81 | Temp 97.5°F | Ht 61.0 in | Wt 174.4 lb

## 2020-10-10 DIAGNOSIS — E7849 Other hyperlipidemia: Secondary | ICD-10-CM

## 2020-10-10 NOTE — Patient Instructions (Addendum)
Start walking routine with the goal of 150 minutes per week. Add fresh produce and high fiber foods to breakfast.       DASH Eating Plan DASH stands for "Dietary Approaches to Stop Hypertension." The DASH eating plan is a healthy eating plan that has been shown to reduce high blood pressure (hypertension). It may also reduce your risk for type 2 diabetes, heart disease, and stroke. The DASH eating plan may also help with weight loss. What are tips for following this plan?  General guidelines  Avoid eating more than 2,300 mg (milligrams) of salt (sodium) a day. If you have hypertension, you may need to reduce your sodium intake to 1,500 mg a day.  Limit alcohol intake to no more than 1 drink a day for nonpregnant women and 2 drinks a day for men. One drink equals 12 oz of beer, 5 oz of wine, or 1 oz of hard liquor.  Work with your health care provider to maintain a healthy body weight or to lose weight. Ask what an ideal weight is for you.  Get at least 30 minutes of exercise that causes your heart to beat faster (aerobic exercise) most days of the week. Activities may include walking, swimming, or biking.  Work with your health care provider or diet and nutrition specialist (dietitian) to adjust your eating plan to your individual calorie needs. Reading food labels   Check food labels for the amount of sodium per serving. Choose foods with less than 5 percent of the Daily Value of sodium. Generally, foods with less than 300 mg of sodium per serving fit into this eating plan.  To find whole grains, look for the word "whole" as the first word in the ingredient list. Shopping  Buy products labeled as "low-sodium" or "no salt added."  Buy fresh foods. Avoid canned foods and premade or frozen meals. Cooking  Avoid adding salt when cooking. Use salt-free seasonings or herbs instead of table salt or sea salt. Check with your health care provider or pharmacist before using salt  substitutes.  Do not fry foods. Cook foods using healthy methods such as baking, boiling, grilling, and broiling instead.  Cook with heart-healthy oils, such as olive, canola, soybean, or sunflower oil. Meal planning  Eat a balanced diet that includes: ? 5 or more servings of fruits and vegetables each day. At each meal, try to fill half of your plate with fruits and vegetables. ? Up to 6-8 servings of whole grains each day. ? Less than 6 oz of lean meat, poultry, or fish each day. A 3-oz serving of meat is about the same size as a deck of cards. One egg equals 1 oz. ? 2 servings of low-fat dairy each day. ? A serving of nuts, seeds, or beans 5 times each week. ? Heart-healthy fats. Healthy fats called Omega-3 fatty acids are found in foods such as flaxseeds and coldwater fish, like sardines, salmon, and mackerel.  Limit how much you eat of the following: ? Canned or prepackaged foods. ? Food that is high in trans fat, such as fried foods. ? Food that is high in saturated fat, such as fatty meat. ? Sweets, desserts, sugary drinks, and other foods with added sugar. ? Full-fat dairy products.  Do not salt foods before eating.  Try to eat at least 2 vegetarian meals each week.  Eat more home-cooked food and less restaurant, buffet, and fast food.  When eating at a restaurant, ask that your food be prepared  with less salt or no salt, if possible. What foods are recommended? The items listed may not be a complete list. Talk with your dietitian about what dietary choices are best for you. Grains Whole-grain or whole-wheat bread. Whole-grain or whole-wheat pasta. Brown rice. [place]. Bulgur. Whole-grain and low-sodium cereals. Pita bread. Low-fat, low-sodium crackers. Whole-wheat flour tortillas. Vegetables Fresh or frozen vegetables (raw, steamed, roasted, or grilled). Low-sodium or reduced-sodium tomato and vegetable juice. Low-sodium or reduced-sodium tomato sauce and tomato  paste. Low-sodium or reduced-sodium canned vegetables. Fruits All fresh, dried, or frozen fruit. Canned fruit in natural juice (without added sugar). Meat and other protein foods Skinless chicken or [place]. Ground chicken or [place]. Pork with fat trimmed off. Fish and seafood. Egg whites. Dried beans, peas, or lentils. Unsalted nuts, nut butters, and seeds. Unsalted canned beans. Lean cuts of beef with fat trimmed off. Low-sodium, lean deli meat. Dairy Low-fat (1%) or fat-free (skim) milk. Fat-free, low-fat, or reduced-fat cheeses. Nonfat, low-sodium ricotta or cottage cheese. Low-fat or nonfat yogurt. Low-fat, low-sodium cheese. Fats and oils Soft margarine without trans fats. Vegetable oil. Low-fat, reduced-fat, or light mayonnaise and salad dressings (reduced-sodium). Canola, safflower, olive, soybean, and sunflower oils. Avocado. Seasoning and other foods Herbs. Spices. Seasoning mixes without salt. Unsalted popcorn and pretzels. Fat-free sweets. What foods are not recommended? The items listed may not be a complete list. Talk with your dietitian about what dietary choices are best for you. Grains Baked goods made with fat, such as croissants, muffins, or some breads. Dry pasta or rice meal packs. Vegetables Creamed or fried vegetables. Vegetables in a cheese sauce. Regular canned vegetables (not low-sodium or reduced-sodium). Regular canned tomato sauce and paste (not low-sodium or reduced-sodium). Regular tomato and vegetable juice (not low-sodium or reduced-sodium). Angie Fava. Olives. Fruits Canned fruit in a light or heavy syrup. Fried fruit. Fruit in cream or butter sauce. Meat and other protein foods Fatty cuts of meat. Ribs. Fried meat. Berniece Salines. Sausage. Bologna and other processed lunch meats. Salami. Fatback. Hotdogs. Bratwurst. Salted nuts and seeds. Canned beans with added salt. Canned or smoked fish. Whole eggs or egg yolks. Chicken or [place] with skin. Dairy Whole or 2% milk,  cream, and half-and-half. Whole or full-fat cream cheese. Whole-fat or sweetened yogurt. Full-fat cheese. Nondairy creamers. Whipped toppings. Processed cheese and cheese spreads. Fats and oils Butter. Stick margarine. Lard. Shortening. Ghee. Bacon fat. Tropical oils, such as coconut, palm kernel, or palm oil. Seasoning and other foods Salted popcorn and pretzels. Onion salt, garlic salt, seasoned salt, table salt, and sea salt. Worcestershire sauce. Tartar sauce. Barbecue sauce. Teriyaki sauce. Soy sauce, including reduced-sodium. Steak sauce. Canned and packaged gravies. Fish sauce. Oyster sauce. Cocktail sauce. Horseradish that you find on the shelf. Ketchup. Mustard. Meat flavorings and tenderizers. Bouillon cubes. Hot sauce and Tabasco sauce. Premade or packaged marinades. Premade or packaged taco seasonings. Relishes. Regular salad dressings. Where to find more information:  National Heart, Lung, and [place]: https://[place]-eaton.com/  American Heart Association: www.heart.org Summary  The DASH eating plan is a healthy eating plan that has been shown to reduce high blood pressure (hypertension). It may also reduce your risk for type 2 diabetes, heart disease, and stroke.  With the DASH eating plan, you should limit salt (sodium) intake to 2,300 mg a day. If you have hypertension, you may need to reduce your sodium intake to 1,500 mg a day.  When on the DASH eating plan, aim to eat more fresh fruits and vegetables, whole grains, lean  proteins, low-fat dairy, and heart-healthy fats.  Work with your health care provider or diet and nutrition specialist (dietitian) to adjust your eating plan to your individual calorie needs. This information is not intended to replace advice given to you by your health care provider. Make sure you discuss any questions you have with your health care provider. Document Revised: 11/05/2017 Document Reviewed: 11/16/2016 Elsevier Patient Education  2020 [employer].

## 2020-10-10 NOTE — Progress Notes (Signed)
Pt here for follow up. Pt was seen on 10/02/20 for fatigue. Pt states she is still low on energy but does feel better. Sleeping still 10-12 hours. Feels better because she is not hot/exhausted.     Patient ID: [name], female    DOB: 03/28/1956, 64 y.o.   MRN: [id number]   Chief Complaint  Patient presents with  . Fatigue   Subjective:  CC: follow-up on fatigue  Presents today for follow-up for fatigue from the 10/27 visit.  Lab work was obtained after that visit, she states that she is feeling a little better.  Denies fever chills chest pain shortness of breath no abdominal pain.    Medical History [name] has a past medical history of Anxiety, Arthritis, Depression, GERD (gastroesophageal reflux disease), Hypertension, Hypothyroid, Hypothyroidism, PONV (postoperative nausea and vomiting), Recurrent upper respiratory infection (URI), and Urticaria.   Outpatient Encounter Medications as of 10/10/2020  Medication Sig  . albuterol (PROVENTIL) (2.5 MG/3ML) 0.083% nebulizer solution Use via neb q 4 hrs prn wheezing (Patient taking differently: Take 2.5 mg by nebulization every 4 (four) hours as needed for wheezing or shortness of breath. )  . albuterol (VENTOLIN HFA) 108 (90 Base) MCG/ACT inhaler Inhale 1-2 puffs into the lungs every 6 (six) hours as needed for wheezing or shortness of breath.  . dicyclomine (BENTYL) 10 MG capsule Take 1 capsule (10 mg total) by mouth 3 (three) times daily as needed (abd pain, diarrhea).  . doxycycline (VIBRAMYCIN) 100 MG capsule Take 1 capsule (100 mg total) by mouth 2 (two) times daily.  . fluticasone (FLONASE) 50 MCG/ACT nasal spray Place 2 sprays into both nostrils daily. (Patient taking differently: Place 2 sprays into both nostrils daily as needed for allergies. )  . gabapentin (NEURONTIN) 100 MG capsule Take 100 mg by mouth at bedtime as needed (back pain).   Marland Kitchen levothyroxine (SYNTHROID) 150 MCG tablet Take one and a half tablets on Sunday and one  tablet all other days (Patient taking differently: Take 150-225 mcg by mouth See admin instructions. Take 150 mcg every day except Sundays take 225 mcg (1.5 tab))  . lisinopril (ZESTRIL) 20 MG tablet TAKE (1) TABLET BY MOUTH ONCE DAILY.  . meloxicam (MOBIC) 7.5 MG tablet Take 7.5 mg by mouth daily.  . Naphazoline-Pheniramine (OPCON-A) 0.027-0.315 % SOLN Place 1 drop into both eyes daily.  . pantoprazole (PROTONIX) 40 MG tablet Take 1 tablet (40 mg total) by mouth daily before breakfast.  . polyethylene glycol powder (GLYCOLAX/MIRALAX) 17 GM/SCOOP powder Take 17 g by mouth daily.  . psyllium (METAMUCIL SMOOTH TEXTURE) 58.6 % powder Take 1 packet by mouth at bedtime.  . saline (AYR) GEL Place 1 application into both nostrils daily as needed (dryness).  . sertraline (ZOLOFT) 100 MG tablet Take 2 tablets (200 mg total) by mouth daily.  . Sodium Fluoride (PREVIDENT 5000 BOOSTER) 1.1 % PSTE Place 1 application onto teeth at bedtime.  . valACYclovir (VALTREX) 1000 MG tablet TAKE 1 TABLET BY MOUTH THREE TIMES DAILY FOR 7 DAYS. (Patient taking differently: Take 1,000 mg by mouth daily as needed (fever blisters). Take for 5 days at a time)   No facility-administered encounter medications on file as of 10/10/2020.     Review of Systems  Constitutional: Negative for chills and fever.  Respiratory: Negative for shortness of breath.   Cardiovascular: Negative for chest pain.  Gastrointestinal: Negative for abdominal pain.     Vitals BP 132/80   Pulse 81   Temp (!) 97.5  F (36.4 C)   Ht 5\' 1"  (1.549 m)   Wt 174 lb 6.4 oz (79.1 kg)   SpO2 97%   BMI 32.95 kg/m   Objective:   Physical Exam Vitals and nursing note reviewed.  Constitutional:      General: She is not in acute distress.    Appearance: Normal appearance.  Cardiovascular:     Rate and Rhythm: Normal rate and regular rhythm.     Heart sounds: Normal heart sounds.  Pulmonary:     Effort: Pulmonary effort is normal.     Breath  sounds: Normal breath sounds.  Skin:    General: Skin is warm and dry.  Neurological:     Mental Status: She is alert and oriented to person, place, and time.  Psychiatric:        Mood and Affect: Mood normal.        Behavior: Behavior normal.        Thought Content: Thought content normal.        Judgment: Judgment normal.      Assessment and Plan   1. Other hyperlipidemia   Labs from October 27 reviewed in detail.  She has a slightly elevated cholesterol and LDL level. All other labs were within normal limits. Much of the days visit was spent discussing lifestyle modification.  We discussed her food intake, and exercise.  She will try to improve her breakfast, decrease the amount of animal protein, and high-fiber foods such as fruits in oatmeal.  She will began an exercise routine with a goal of 150 minutes/week.  Agrees with plan of care discussed today. Understands warning signs to seek further care: Chest pain, shortness of breath, any significant changes in health status. Understands to follow-up in 2 to 3 months for her annual wellness exam. No labs needed at that time. Will consider changing her antidepressant medication, as she has been on the same one for over 10 years. She wonders if her depression is not as well managed as she thinks it is. She reports that she feels she is happy, but her fatigue could be a sign.    October 29, NP 10/10/2020

## 2020-10-28 ENCOUNTER — Other Ambulatory Visit: Payer: Self-pay | Admitting: Family Medicine

## 2020-10-28 DIAGNOSIS — F419 Anxiety disorder, unspecified: Secondary | ICD-10-CM

## 2020-10-28 DIAGNOSIS — F321 Major depressive disorder, single episode, moderate: Secondary | ICD-10-CM

## 2020-11-07 ENCOUNTER — Ambulatory Visit: Payer: PPO | Attending: Internal Medicine

## 2020-11-07 DIAGNOSIS — Z23 Encounter for immunization: Secondary | ICD-10-CM

## 2020-11-07 NOTE — Progress Notes (Signed)
   Covid-19 Vaccination Clinic  Name:  [name]    MRN: [id number] DOB: [birth year]  11/07/2020  [name] was observed post Covid-19 immunization for 15 minutes without incident. She was provided with Vaccine Information Sheet and instruction to access the V-Safe system.   [name] was instructed to call 911 with any severe reactions post vaccine: Marland Kitchen Difficulty breathing  . Swelling of face and throat  . A fast heartbeat  . A bad rash all over body  . Dizziness and weakness   Immunizations Administered    No immunizations on file.

## 2020-11-11 ENCOUNTER — Encounter: Payer: PPO | Admitting: Family Medicine

## 2020-11-14 ENCOUNTER — Other Ambulatory Visit: Payer: Self-pay

## 2020-11-14 ENCOUNTER — Ambulatory Visit
Admission: EM | Admit: 2020-11-14 | Discharge: 2020-11-14 | Disposition: A | Discharge status: Home / Self Care | Payer: PPO | Attending: Emergency Medicine | Admitting: Emergency Medicine

## 2020-11-14 ENCOUNTER — Encounter: Payer: Self-pay | Admitting: Emergency Medicine

## 2020-11-14 DIAGNOSIS — M940 Chondrocostal junction syndrome [Tietze]: Secondary | ICD-10-CM

## 2020-11-14 MED ORDER — PREDNISONE 10 MG PO TABS: 20.0000 mg | ORAL_TABLET | Freq: Every day | ORAL | 0 refills | Status: DC

## 2020-11-14 MED ORDER — BENZONATATE 100 MG PO CAPS: 100.0000 mg | ORAL_CAPSULE | Freq: Three times a day (TID) | ORAL | 0 refills | Status: DC

## 2020-11-14 NOTE — Discharge Instructions (Signed)
Get plenty of rest and push fluids Tessalon Perles prescribed for cough Prednisone was prescribed  Continue to take gabapentin as prescribed Take OTC Tylenol 500 mg needed for pain Follow-up with PCP Use medication  as needed fever or pain Call or go to the ED if you have any new or worsening symptoms such as fever, worsening cough, shortness of breath, chest tightness, chest pain, turning blue, changes in mental status, etc..Marland Kitchen

## 2020-11-14 NOTE — ED Provider Notes (Signed)
[facility]   [id number] 11/14/20 Arrival Time: 1153   Chief Complaint  Patient presents with  . Back Pain     SUBJECTIVE: History from: patient.  [name] is a 64 y.o. female who presented to the urgent care with a complaint of back pain that started yesterday.  Developed the symptom after excessive cough as she is asthmatic.  Denies recent travel.  Has tried OTC without relief.  Symptoms are now made worse with deep breath.  Denies previous symptoms in the past.   Denies fever, chills, fatigue, sinus pain, rhinorrhea, sore throat, SOB, wheezing, chest pain, nausea, changes in bowel or bladder habits.    ROS: As per HPI.  All other pertinent ROS negative.       Past Medical History:  Diagnosis Date  . Anxiety   . Arthritis   . Depression   . GERD (gastroesophageal reflux disease)   . Hypertension   . Hypothyroid   . Hypothyroidism   . PONV (postoperative nausea and vomiting)   . Recurrent upper respiratory infection (URI)   . Urticaria    Past Surgical History:  Procedure Laterality Date  . BACK SURGERY    . BIOPSY  06/03/2020   Procedure: BIOPSY;  Surgeon: Malissa Hippo, MD;  Location: [facility];  Service: Endoscopy;;  . CARPAL TUNNEL RELEASE  06/16/2012   Procedure: CARPAL TUNNEL RELEASE;  Surgeon: Dominica Severin, MD;  Location: [facility];  Service: Orthopedics;  Laterality: Right;  limited open carpal tunnel release  . CERVICAL FUSION  2018  . CHOLECYSTECTOMY    . COLONOSCOPY    . COLONOSCOPY WITH PROPOFOL N/A 06/03/2020   Procedure: COLONOSCOPY WITH PROPOFOL;  Surgeon: Malissa Hippo, MD;  Location: [facility];  Service: Endoscopy;  Laterality: N/A;  115  . ESOPHAGOGASTRODUODENOSCOPY (EGD) WITH PROPOFOL N/A 06/03/2020   Procedure: ESOPHAGOGASTRODUODENOSCOPY (EGD) WITH PROPOFOL;  Surgeon: Malissa Hippo, MD;  Location: [facility];  Service: Endoscopy;  Laterality: N/A;  . TONSILLECTOMY    . TRIGGER FINGER RELEASE   06/16/2012   Procedure: RELEASE TRIGGER FINGER/A-1 PULLEY;  Surgeon: Dominica Severin, MD;  Location:  SURGERY CENTER;  Service: Orthopedics;  Laterality: Right;  right middle a-1 release  . TUBAL LIGATION    . UPPER GASTROINTESTINAL ENDOSCOPY    . VAGINAL HYSTERECTOMY     Allergies  Allergen Reactions  . Penicillins Hives   No current facility-administered medications on file prior to encounter.   Current Outpatient Medications on File Prior to Encounter  Medication Sig Dispense Refill  . albuterol (PROVENTIL) (2.5 MG/3ML) 0.083% nebulizer solution Use via neb q 4 hrs prn wheezing (Patient taking differently: Take 2.5 mg by nebulization every 4 (four) hours as needed for wheezing or shortness of breath. ) 25 mL 0  . albuterol (VENTOLIN HFA) 108 (90 Base) MCG/ACT inhaler Inhale 1-2 puffs into the lungs every 6 (six) hours as needed for wheezing or shortness of breath. 18 g 0  . dicyclomine (BENTYL) 10 MG capsule Take 1 capsule (10 mg total) by mouth 3 (three) times daily as needed (abd pain, diarrhea). 60 capsule 1  . doxycycline (VIBRAMYCIN) 100 MG capsule Take 1 capsule (100 mg total) by mouth 2 (two) times daily. 20 capsule 0  . fluticasone (FLONASE) 50 MCG/ACT nasal spray Place 2 sprays into both nostrils daily. (Patient taking differently: Place 2 sprays into both nostrils daily as needed for allergies. ) 16 g 5  . gabapentin (NEURONTIN) 100 MG  capsule Take 100 mg by mouth at bedtime as needed (back pain).     Marland Kitchen levothyroxine (SYNTHROID) 150 MCG tablet Take one and a half tablets on Sunday and one tablet all other days (Patient taking differently: Take 150-225 mcg by mouth See admin instructions. Take 150 mcg every day except Sundays take 225 mcg (1.5 tab)) 96 tablet 1  . lisinopril (ZESTRIL) 20 MG tablet TAKE (1) TABLET BY MOUTH ONCE DAILY. 90 tablet 0  . meloxicam (MOBIC) 7.5 MG tablet Take 7.5 mg by mouth daily.    . Naphazoline-Pheniramine (OPCON-A) 0.027-0.315 % SOLN Place 1  drop into both eyes daily.    . pantoprazole (PROTONIX) 40 MG tablet Take 1 tablet (40 mg total) by mouth daily before breakfast. 90 tablet 3  . polyethylene glycol powder (GLYCOLAX/MIRALAX) 17 GM/SCOOP powder Take 17 g by mouth daily. 507 g 5  . psyllium (METAMUCIL SMOOTH TEXTURE) 58.6 % powder Take 1 packet by mouth at bedtime.    . saline (AYR) GEL Place 1 application into both nostrils daily as needed (dryness).    . sertraline (ZOLOFT) 100 MG tablet TAKE 2 TABLETS BY MOUTH ONCE DAILY. 180 tablet 0  . Sodium Fluoride (PREVIDENT 5000 BOOSTER) 1.1 % PSTE Place 1 application onto teeth at bedtime. 100 g 3  . valACYclovir (VALTREX) 1000 MG tablet TAKE 1 TABLET BY MOUTH THREE TIMES DAILY FOR 7 DAYS. (Patient taking differently: Take 1,000 mg by mouth daily as needed (fever blisters). Take for 5 days at a time) 21 tablet 3   Social History   Socioeconomic History  . Marital status: Married    Spouse name: Not on file  . Number of children: Not on file  . Years of education: 55  . Highest education level: Not on file  Occupational History  . Not on file  Tobacco Use  . Smoking status: Former Smoker    Packs/day: 1.50    Years: 27.00    Pack years: 40.50    Start date: 1973    Quit date: 2000    Years since quitting: 21.9  . Smokeless tobacco: Never Used  Vaping Use  . Vaping Use: Never used  Substance and Sexual Activity  . Alcohol use: No  . Drug use: No  . Sexual activity: Not on file  Other Topics Concern  . Not on file  Social History Narrative  . Not on file   Social Determinants of Health   Financial Resource Strain: Not on file  Food Insecurity: Not on file  Transportation Needs: Not on file  Physical Activity: Not on file  Stress: Not on file  Social Connections: Not on file  Intimate Partner Violence: Not on file   Family History  Problem Relation Age of Onset  . COPD Mother   . Osteoporosis Mother   . Immunodeficiency Mother   . COPD Father   . Emphysema  Father   . Tuberculosis Father   . Lung disease Father   . COPD Sister   . Healthy Daughter   . Healthy Son   . Arthritis Other   . Lung disease Other   . Cancer Other   . Asthma Other   . Eczema Neg Hx   . Atopy Neg Hx   . Angioedema Neg Hx   . Allergic rhinitis Neg Hx     OBJECTIVE:  Vitals:   11/14/20 1304 11/14/20 1305  BP: (!) 144/87   Pulse: 81   Resp: 19   Temp: 98 F (  36.7 C)   TempSrc: Oral   SpO2: 94%   Weight:  175 lb (79.4 kg)  Height:  5\' 2"  (1.575 m)     General appearance: alert; appears fatigued, but nontoxic; speaking in full sentences and tolerating own secretions HEENT: NCAT; Ears: EACs clear, TMs pearly gray; Eyes: PERRL.  EOM grossly intact. Sinuses: nontender; Nose: nares patent without rhinorrhea, Throat: oropharynx clear, tonsils non erythematous or enlarged, uvula midline  Neck: supple without LAD Lungs: unlabored respirations, symmetrical air entry; cough: moderate; no respiratory distress; CTAB Heart: regular rate and rhythm.  Radial pulses 2+ symmetrical bilaterally Skin: warm and dry Musc: Posterior right rib cage pain; tender to palpation Psychological: alert and cooperative; normal mood and affect  LABS:  No results found for this or any previous visit (from the past 24 hour(s)).   ASSESSMENT & PLAN:  1. Costochondritis     Meds ordered this encounter  Medications  . benzonatate (TESSALON) 100 MG capsule    Sig: Take 1 capsule (100 mg total) by mouth every 8 (eight) hours.    Dispense:  30 capsule    Refill:  0  . predniSONE (DELTASONE) 10 MG tablet    Sig: Take 2 tablets (20 mg total) by mouth daily.    Dispense:  15 tablet    Refill:  0   Discharge instructions   Get plenty of rest and push fluids Tessalon Perles prescribed for cough Prednisone was prescribed  Continue to take gabapentin as prescribed Take OTC Tylenol 500 mg needed for pain Follow-up with PCP Use medication  as needed fever or pain Call or go to the  ED if you have any new or worsening symptoms such as fever, worsening cough, shortness of breath, chest tightness, chest pain, turning blue, changes in mental status, etc...   Reviewed expectations re: course of current medical issues. Questions answered. Outlined signs and symptoms indicating need for more acute intervention. Patient verbalized understanding. After Visit Summary given.          , FNP 11/14/20 1330

## 2020-11-14 NOTE — ED Triage Notes (Signed)
Pt had a coughing episode last night after something got in her throat. States afterwards she was having pain in her mid back with movement that is not better today. Also having pain in RT ear that started x 2 days ago.

## 2020-12-12 DIAGNOSIS — L810 Postinflammatory hyperpigmentation: Secondary | ICD-10-CM | POA: Diagnosis not present

## 2020-12-12 DIAGNOSIS — L570 Actinic keratosis: Secondary | ICD-10-CM | POA: Diagnosis not present

## 2020-12-12 DIAGNOSIS — L720 Epidermal cyst: Secondary | ICD-10-CM | POA: Diagnosis not present

## 2020-12-12 DIAGNOSIS — L718 Other rosacea: Secondary | ICD-10-CM | POA: Diagnosis not present

## 2020-12-19 ENCOUNTER — Other Ambulatory Visit: Payer: PPO

## 2020-12-19 DIAGNOSIS — Z20822 Contact with and (suspected) exposure to covid-19: Secondary | ICD-10-CM | POA: Diagnosis not present

## 2020-12-24 LAB — NOVEL CORONAVIRUS, NAA: SARS-CoV-2, NAA: NOT DETECTED

## 2021-01-18 ENCOUNTER — Other Ambulatory Visit: Payer: Self-pay | Admitting: Family Medicine

## 2021-01-20 ENCOUNTER — Ambulatory Visit: Payer: PPO | Admitting: Family Medicine

## 2021-01-20 ENCOUNTER — Encounter: Payer: Self-pay | Admitting: Family Medicine

## 2021-01-20 ENCOUNTER — Ambulatory Visit (INDEPENDENT_AMBULATORY_CARE_PROVIDER_SITE_OTHER): Payer: PPO | Admitting: Family Medicine

## 2021-01-20 ENCOUNTER — Other Ambulatory Visit: Payer: Self-pay

## 2021-01-20 VITALS — BP 126/87 | HR 83 | Temp 97.6°F | Ht 62.0 in | Wt 177.0 lb

## 2021-01-20 DIAGNOSIS — J4541 Moderate persistent asthma with (acute) exacerbation: Secondary | ICD-10-CM | POA: Insufficient documentation

## 2021-01-20 DIAGNOSIS — J4540 Moderate persistent asthma, uncomplicated: Secondary | ICD-10-CM | POA: Diagnosis not present

## 2021-01-20 DIAGNOSIS — R5383 Other fatigue: Secondary | ICD-10-CM

## 2021-01-20 DIAGNOSIS — Z20822 Contact with and (suspected) exposure to covid-19: Secondary | ICD-10-CM

## 2021-01-20 HISTORY — DX: Moderate persistent asthma, uncomplicated: J45.40

## 2021-01-20 MED ORDER — BUDESONIDE-FORMOTEROL FUMARATE 80-4.5 MCG/ACT IN AERO: 2.0000 | INHALATION_SPRAY | Freq: Two times a day (BID) | RESPIRATORY_TRACT | 3 refills | Status: DC

## 2021-01-20 NOTE — Patient Instructions (Signed)

## 2021-01-20 NOTE — Progress Notes (Signed)
Pt here for fatigue, feeling terrible, flu like symptoms, nausea, constant stomach pain, had diarrhea for 8 days, dry mouth and headache. Pt is able to eat and drink ok. COVID negative; took test last week on Wednesday. Pt states that at Christmas all of her family had COVID except her. Pt wondering if one of her meds may be causing the dry mouth.     Patient ID: [name], female    DOB: 01/11/1956, 65 y.o.   MRN: [id number]   Chief Complaint  Patient presents with  . Fatigue   Subjective:  Cc: Dry mouth, headache and fatigue  This is a new problem.  Presents today for an acute visit with a complaint of dry mouth, headache, and fatigue.  Symptoms have started 2 weeks ago worse recently.  Reports that every morning she wakes up with a headache and feels fatigued even after 10 to 12 hours of sleep.  Originally seen for the fatigue in October 2021, when lab work was  normal at that time.  Reports that around Christmas time everyone in her family was positive for COVID except for herself.  Around the first of the year she just has not been feeling well, and she was negative for Covid at that time.  3 weeks ago she started waking up with a bad headache every morning, having dry mouth, sore throat, nausea, diarrhea after eating.  She started taking some medication for her diarrhea, that has stopped and she is now possibly constipated.  She does see Dr. Dionicia Abler, GI sees him every September.  Her last Covid test was last Thursday or Friday home test which was negative.  Denies fever, chills endorses fatigue, congestion, cough, chest tightness diarrhea and nausea.  Headaches every morning.  Reports that her husband does say she snores.  Has never had a sleep study.    Medical History Deniya has a past medical history of Anxiety, Arthritis, Depression, GERD (gastroesophageal reflux disease), Hypertension, Hypothyroid, Hypothyroidism, Moderate persistent asthma (01/20/2021), PONV (postoperative nausea and  vomiting), Recurrent upper respiratory infection (URI), and Urticaria.   Outpatient Encounter Medications as of 01/20/2021  Medication Sig  . albuterol (PROVENTIL) (2.5 MG/3ML) 0.083% nebulizer solution Use via neb q 4 hrs prn wheezing (Patient taking differently: Take 2.5 mg by nebulization every 4 (four) hours as needed for wheezing or shortness of breath.)  . albuterol (VENTOLIN HFA) 108 (90 Base) MCG/ACT inhaler Inhale 1-2 puffs into the lungs every 6 (six) hours as needed for wheezing or shortness of breath.  . budesonide-formoterol (SYMBICORT) 80-4.5 MCG/ACT inhaler Inhale 2 puffs into the lungs 2 (two) times daily.  Marland Kitchen dicyclomine (BENTYL) 10 MG capsule Take 1 capsule (10 mg total) by mouth 3 (three) times daily as needed (abd pain, diarrhea).  . fluticasone (FLONASE) 50 MCG/ACT nasal spray Place 2 sprays into both nostrils daily. (Patient taking differently: Place 2 sprays into both nostrils daily as needed for allergies.)  . gabapentin (NEURONTIN) 100 MG capsule Take 100 mg by mouth at bedtime as needed (back pain).   Marland Kitchen levothyroxine (SYNTHROID) 150 MCG tablet Take one and a half tablets on Sunday and one tablet all other days (Patient taking differently: Take 150-225 mcg by mouth See admin instructions. Take 150 mcg every day except Sundays take 225 mcg (1.5 tab))  . lisinopril (ZESTRIL) 20 MG tablet TAKE (1) TABLET BY MOUTH ONCE DAILY.  . meloxicam (MOBIC) 7.5 MG tablet Take 7.5 mg by mouth daily.  . Naphazoline-Pheniramine (OPCON-A) 0.027-0.315 % SOLN Place  1 drop into both eyes daily.  . pantoprazole (PROTONIX) 40 MG tablet Take 1 tablet (40 mg total) by mouth daily before breakfast.  . polyethylene glycol powder (GLYCOLAX/MIRALAX) 17 GM/SCOOP powder Take 17 g by mouth daily.  . predniSONE (DELTASONE) 10 MG tablet Take 2 tablets (20 mg total) by mouth daily.  . psyllium (METAMUCIL SMOOTH TEXTURE) 58.6 % powder Take 1 packet by mouth at bedtime.  . saline (AYR) GEL Place 1 application  into both nostrils daily as needed (dryness).  . sertraline (ZOLOFT) 100 MG tablet TAKE 2 TABLETS BY MOUTH ONCE DAILY.  Marland Kitchen Sodium Fluoride (PREVIDENT 5000 BOOSTER) 1.1 % PSTE Place 1 application onto teeth at bedtime.  . valACYclovir (VALTREX) 1000 MG tablet TAKE 1 TABLET BY MOUTH THREE TIMES DAILY FOR 7 DAYS. (Patient taking differently: Take 1,000 mg by mouth daily as needed (fever blisters). Take for 5 days at a time)  . [DISCONTINUED] benzonatate (TESSALON) 100 MG capsule Take 1 capsule (100 mg total) by mouth every 8 (eight) hours.  . [DISCONTINUED] doxycycline (VIBRAMYCIN) 100 MG capsule Take 1 capsule (100 mg total) by mouth 2 (two) times daily.   No facility-administered encounter medications on file as of 01/20/2021.     Review of Systems  Constitutional: Positive for fatigue. Negative for chills and fever.  HENT: Positive for congestion.   Respiratory: Positive for cough and chest tightness. Negative for shortness of breath and wheezing.   Cardiovascular: Negative for chest pain.  Gastrointestinal: Positive for diarrhea and nausea. Negative for abdominal pain and vomiting.       Feels nausea with each meal.   Neurological: Positive for headaches.       Has headache every morning for past 2 weeks.      Vitals BP 126/87   Pulse 83   Temp 97.6 F (36.4 C)   Ht 5\' 2"  (1.575 m)   Wt 177 lb (80.3 kg)   SpO2 96%   BMI 32.37 kg/m   Objective:   Physical Exam Vitals reviewed.  Constitutional:      Appearance: Normal appearance.  Cardiovascular:     Rate and Rhythm: Normal rate and regular rhythm.     Heart sounds: Normal heart sounds.  Pulmonary:     Effort: Pulmonary effort is normal.     Breath sounds: Normal breath sounds.  Skin:    General: Skin is warm and dry.  Neurological:     General: No focal deficit present.     Mental Status: She is alert.  Psychiatric:        Behavior: Behavior normal.      Assessment and Plan   1. Moderate persistent asthma,  unspecified whether complicated - budesonide-formoterol (SYMBICORT) 80-4.5 MCG/ACT inhaler; Inhale 2 puffs into the lungs 2 (two) times daily.  Dispense: 1 each; Refill: 3  2. Other fatigue - Split night study  3. Close exposure to COVID-19 virus - Novel Coronavirus, NAA ([facility])   During medication reconciliation.  Reports that she is taking her albuterol nebulizer and inhaler more than twice per week- every other day.   Will step up her asthma therapy to include ICS-LABA inhaler.  She will continue to use her albuterol inhaler, SABA therapy as needed.  Due to extreme fatigue, reports of snoring, will order sleep study.  Due to close exposure to Covid, fatigue, diarrhea, congestion, will test for Covid infection today.  She reports that she has only had home COVID test never a PCR test.  Agrees with plan of  care discussed today. Understands warning signs to seek further care: chest pain, shortness of breath, any significant change in health.  Understands to follow-up in one month, sooner if needed to assess asthma control, fatigue, possible sleep apnea.  Will notify once COVID results are available.    Novella Olive, NP 01/20/2021

## 2021-01-21 ENCOUNTER — Telehealth: Payer: Self-pay

## 2021-01-21 LAB — SARS-COV-2, NAA 2 DAY TAT

## 2021-01-21 LAB — NOVEL CORONAVIRUS, NAA: SARS-CoV-2, NAA: NOT DETECTED

## 2021-01-21 NOTE — Telephone Encounter (Signed)
Pt states she thought a different inhaler was going to be sent in not the same thing she already had at home ( symbicort) she states she has 3 full ones at home already and it was $45 when she picked up another one yesterday. States she uses it about twice a week.

## 2021-01-21 NOTE — Telephone Encounter (Signed)
Discussed with pt. Pt verbalized understanding.  °

## 2021-01-21 NOTE — Telephone Encounter (Signed)
I was unaware that she already had this inhaler. She does not need an additional inhaler then. Await sleep study.  Thanks, KD

## 2021-01-21 NOTE — Telephone Encounter (Signed)
Patient was seen yesterday and prescribe budesonide-formoterol 80-4.5 and she states didn't need this because she already has 2 of them at home. Please advise

## 2021-01-22 ENCOUNTER — Other Ambulatory Visit: Payer: Self-pay | Admitting: Family Medicine

## 2021-01-22 ENCOUNTER — Telehealth: Payer: Self-pay

## 2021-01-22 DIAGNOSIS — J40 Bronchitis, not specified as acute or chronic: Secondary | ICD-10-CM

## 2021-01-22 DIAGNOSIS — E7849 Other hyperlipidemia: Secondary | ICD-10-CM

## 2021-01-22 DIAGNOSIS — E039 Hypothyroidism, unspecified: Secondary | ICD-10-CM

## 2021-01-22 DIAGNOSIS — I10 Essential (primary) hypertension: Secondary | ICD-10-CM

## 2021-01-22 MED ORDER — PREDNISONE 10 MG PO TABS: 10.0000 mg | ORAL_TABLET | Freq: Every day | ORAL | 0 refills | Status: DC

## 2021-01-22 NOTE — Telephone Encounter (Signed)
Please advise. Thank you

## 2021-01-22 NOTE — Telephone Encounter (Signed)
Patient calling back stating "that she needs a steriod" called in.  Was seen on Monday, neg covid test. York Spaniel she is all congested in her head, ears and chest.  Said that Dr. Brett Canales always gave her steriods.  [employer]

## 2021-01-22 NOTE — Telephone Encounter (Signed)
Please notify Marcea that I sent a short course of prednisone to [facility].  I noticed that she also got steroids in December. Please advise her that steroids can be harmful when used too much.   Prednisone warning:   Prednisone is an anti-inflammatory medication. It is best to only use it for short periods of time. It can cause  your blood pressure to increase and it can cause your blood sugar to increase. If you are a diabetic, please check your blood sugar twice per day while you are taking prednisone. If your sugars are > 200 or < 70 please seek medical attention right away.   Thanks, Clydie Braun

## 2021-01-22 NOTE — Telephone Encounter (Signed)
Discussed with pt. Pt verbalized understanding.  °

## 2021-01-23 ENCOUNTER — Telehealth (INDEPENDENT_AMBULATORY_CARE_PROVIDER_SITE_OTHER): Payer: PPO | Admitting: Family Medicine

## 2021-01-23 ENCOUNTER — Telehealth: Payer: Self-pay

## 2021-01-23 ENCOUNTER — Encounter: Payer: Self-pay | Admitting: Family Medicine

## 2021-01-23 DIAGNOSIS — J4541 Moderate persistent asthma with (acute) exacerbation: Secondary | ICD-10-CM | POA: Diagnosis not present

## 2021-01-23 MED ORDER — PREDNISONE 20 MG PO TABS: ORAL_TABLET | ORAL | 0 refills | Status: DC

## 2021-01-23 NOTE — Telephone Encounter (Signed)
Needs labs before next visit with karen, pls order cbc, cmp, lipids, and tsh.   Thx.  Dr Ladona Ridgel

## 2021-01-23 NOTE — Telephone Encounter (Signed)
This sounds like a change in condition, please schedule a phone visit. Thanks,KD

## 2021-01-23 NOTE — Telephone Encounter (Signed)
Husband called in wife has respiratory issues and only way to treat is prednisone when she has Flar ups and said over the years they have treated her with this. Wants this wrote the way she has had it in years past.    [name] husband [phone]

## 2021-01-23 NOTE — Telephone Encounter (Signed)
Pt scheduled for phone visit today.

## 2021-01-23 NOTE — Progress Notes (Signed)
Patient ID: [name], female    DOB: 07-10-1956, 65 y.o.   MRN: [id number]   Chief Complaint  Patient presents with  . Nasal Congestion    Flare of asthma   Subjective:  CC: "hard time breathing"   This is a new problem.  Presents today via telephone visit with "hard time breathing "symptoms started 2 days ago.  Reports that she went outside, has been exposed to ragweed, she is allergic to ragweed.  Denies fever, chills, chest pain but endorses shortness of breath and wheezing.  Reports that she responds to prednisone taper when she has asthma exacerbations.  Has inhalers and nebulizers at home in place.    Medical History [name] has a past medical history of Anxiety, Arthritis, Depression, GERD (gastroesophageal reflux disease), Hypertension, Hypothyroid, Hypothyroidism, Moderate persistent asthma (01/20/2021), PONV (postoperative nausea and vomiting), Recurrent upper respiratory infection (URI), and Urticaria.   Outpatient Encounter Medications as of 01/23/2021  Medication Sig  . predniSONE (DELTASONE) 20 MG tablet Take 3 tablets by mouth for three days, then 2 tablets by mouth for three days, then one tablet by mouth for three days.  Marland Kitchen albuterol (PROVENTIL) (2.5 MG/3ML) 0.083% nebulizer solution Use via neb q 4 hrs prn wheezing (Patient taking differently: Take 2.5 mg by nebulization every 4 (four) hours as needed for wheezing or shortness of breath.)  . albuterol (VENTOLIN HFA) 108 (90 Base) MCG/ACT inhaler Inhale 1-2 puffs into the lungs every 6 (six) hours as needed for wheezing or shortness of breath.  . budesonide-formoterol (SYMBICORT) 80-4.5 MCG/ACT inhaler Inhale 2 puffs into the lungs 2 (two) times daily.  Marland Kitchen dicyclomine (BENTYL) 10 MG capsule Take 1 capsule (10 mg total) by mouth 3 (three) times daily as needed (abd pain, diarrhea).  . fluticasone (FLONASE) 50 MCG/ACT nasal spray Place 2 sprays into both nostrils daily. (Patient taking differently: Place 2 sprays into both  nostrils daily as needed for allergies.)  . gabapentin (NEURONTIN) 100 MG capsule Take 100 mg by mouth at bedtime as needed (back pain).   Marland Kitchen levothyroxine (SYNTHROID) 150 MCG tablet Take one and a half tablets on Sunday and one tablet all other days (Patient taking differently: Take 150-225 mcg by mouth See admin instructions. Take 150 mcg every day except Sundays take 225 mcg (1.5 tab))  . lisinopril (ZESTRIL) 20 MG tablet TAKE (1) TABLET BY MOUTH ONCE DAILY.  . meloxicam (MOBIC) 7.5 MG tablet Take 7.5 mg by mouth daily.  . Naphazoline-Pheniramine (OPCON-A) 0.027-0.315 % SOLN Place 1 drop into both eyes daily.  . pantoprazole (PROTONIX) 40 MG tablet Take 1 tablet (40 mg total) by mouth daily before breakfast.  . polyethylene glycol powder (GLYCOLAX/MIRALAX) 17 GM/SCOOP powder Take 17 g by mouth daily.  . psyllium (METAMUCIL SMOOTH TEXTURE) 58.6 % powder Take 1 packet by mouth at bedtime.  . saline (AYR) GEL Place 1 application into both nostrils daily as needed (dryness).  . sertraline (ZOLOFT) 100 MG tablet TAKE 2 TABLETS BY MOUTH ONCE DAILY.  Marland Kitchen Sodium Fluoride (PREVIDENT 5000 BOOSTER) 1.1 % PSTE Place 1 application onto teeth at bedtime.  . valACYclovir (VALTREX) 1000 MG tablet TAKE 1 TABLET BY MOUTH THREE TIMES DAILY FOR 7 DAYS. (Patient taking differently: Take 1,000 mg by mouth daily as needed (fever blisters). Take for 5 days at a time)  . [DISCONTINUED] predniSONE (DELTASONE) 10 MG tablet Take 1 tablet (10 mg total) by mouth daily with breakfast.   No facility-administered encounter medications on file as of 01/23/2021.  Review of Systems  Constitutional: Negative for chills and fever.  HENT: Positive for congestion.   Respiratory: Positive for cough, shortness of breath and wheezing.      Vitals There were no vitals taken for this visit. Phone visit Objective:   Physical Exam Unable, phone visit.  Assessment and Plan   1. Moderate persistent asthma with exacerbation -  predniSONE (DELTASONE) 20 MG tablet; Take 3 tablets by mouth for three days, then 2 tablets by mouth for three days, then one tablet by mouth for three days.  Dispense: 18 tablet; Refill: 0   Will treat with prednisone taper for asthma exacerbation.  Has inhalers, nebulizers in place.  Sees Dr. Dellis Anes, allergist, on a regular basis.  Agrees with plan of care discussed today. Understands warning signs to seek further care: chest pain, shortness of breath, any significant change in health.  Understands to follow-up if symptoms do not improve, or worsen.  Encouraged visit with allergist as soon as possible.  She reports that she gets allergy injections.  If symptoms persist, recommend an in person visit for physical exam.   Prednisone warning: Prednisone is an anti-inflammatory medication. It is best to only use it for short periods of time. It can cause  your blood pressure to increase and it can cause your blood sugar to increase. If you are a diabetic, please check your blood sugar twice per day while you are taking prednisone. If your sugars are > 200 or < 70 please seek medical attention right away.     Virtual Visit via Telephone Note  I connected with [name] on 01/23/21 at  3:50 PM EST by telephone and verified that I am speaking with the correct person using two identifiers.  Location: Patient: home Provider: office   I discussed the limitations, risks, security and privacy concerns of performing an evaluation and management service by telephone and the availability of in person appointments. I also discussed with the patient that there may be a patient responsible charge related to this service. The patient expressed understanding and agreed to proceed.   History of Present Illness:    Observations/Objective:   Assessment and Plan:   Follow Up Instructions:    I discussed the assessment and treatment plan with the patient. The patient was provided an opportunity  to ask questions and all were answered. The patient agreed with the plan and demonstrated an understanding of the instructions.   The patient was advised to call back or seek an in-person evaluation if the symptoms worsen or if the condition fails to improve as anticipated.  I provided 7  minutes of non-face-to-face time during this encounter.

## 2021-01-24 ENCOUNTER — Other Ambulatory Visit: Payer: Self-pay | Admitting: *Deleted

## 2021-01-24 NOTE — Addendum Note (Signed)
Addended by: Marlowe Shores on: 01/24/2021 08:07 AM   Modules accepted: Orders

## 2021-01-24 NOTE — Telephone Encounter (Signed)
Error

## 2021-01-24 NOTE — Telephone Encounter (Signed)
Lab orders placed. My chart message sent to patient. Labs mailed to patient.

## 2021-02-03 ENCOUNTER — Other Ambulatory Visit: Payer: Self-pay | Admitting: Family Medicine

## 2021-02-03 DIAGNOSIS — F321 Major depressive disorder, single episode, moderate: Secondary | ICD-10-CM

## 2021-02-03 DIAGNOSIS — F419 Anxiety disorder, unspecified: Secondary | ICD-10-CM

## 2021-02-03 NOTE — Telephone Encounter (Signed)
Left my chart message to schedule appointment

## 2021-02-04 NOTE — Patient Instructions (Addendum)
Moderate persistent asthma Get STAT d- dimer. We will call you with results Stop Symbicort 80/4.5 mcg  Start Symbicort 160/4.5 mcg 2 puffs twice a day with spacer May use albuterol 2 puffs every 4-6 hours as needed for cough, wheeze, tightness in chest, shortness of breath Also, may use albuterol 2 puffs 5 to 15 minutes prior to exercise Asthma control goals:   Full participation in all desired activities (may need albuterol before activity)  Albuterol use two time or less a week on average (not counting use with activity)  Cough interfering with sleep two time or less a month  Oral steroids no more than once a year  No hospitalizations  Seasonal and perennial allergic rhinitis Continue Flonase 2 sprays each nostril once a day as needed for stuffiness Consider allergy injections since you are not able to tolerate antihistamine (overly drying) Once we get your breathing under control we will start your allergy injections  Please let us know if this treatment plan is not working well for you. Schedule a follow-up appointment in 2 weeks or sooner

## 2021-02-05 ENCOUNTER — Ambulatory Visit: Payer: PPO | Admitting: Family

## 2021-02-05 ENCOUNTER — Encounter: Payer: Self-pay | Admitting: Family

## 2021-02-05 ENCOUNTER — Telehealth: Payer: Self-pay | Admitting: Family Medicine

## 2021-02-05 ENCOUNTER — Other Ambulatory Visit: Payer: Self-pay

## 2021-02-05 VITALS — BP 124/80 | HR 88 | Resp 16

## 2021-02-05 DIAGNOSIS — J3089 Other allergic rhinitis: Secondary | ICD-10-CM

## 2021-02-05 DIAGNOSIS — J4540 Moderate persistent asthma, uncomplicated: Secondary | ICD-10-CM

## 2021-02-05 DIAGNOSIS — J302 Other seasonal allergic rhinitis: Secondary | ICD-10-CM

## 2021-02-05 DIAGNOSIS — R0602 Shortness of breath: Secondary | ICD-10-CM

## 2021-02-05 MED ORDER — BUDESONIDE-FORMOTEROL FUMARATE 160-4.5 MCG/ACT IN AERO: 2.0000 | INHALATION_SPRAY | Freq: Two times a day (BID) | RESPIRATORY_TRACT | 3 refills | Status: DC

## 2021-02-05 NOTE — Telephone Encounter (Signed)
scheduled

## 2021-02-05 NOTE — Telephone Encounter (Signed)
Seen multiple times over past few months for fatigue. Please advise. Thank you

## 2021-02-05 NOTE — Telephone Encounter (Signed)
Patient doesn't want to schedule appointment to get her refill on Zoloft 100 mg tried to explain to her but she wasn't understanding she has to have 3 or 6 month follow up to get her medication refilled. She states was just in but that was for sick visit.

## 2021-02-05 NOTE — Telephone Encounter (Signed)
Made appointment for 3/22

## 2021-02-05 NOTE — Telephone Encounter (Signed)
Left message to return call and also sent my chart message 

## 2021-02-05 NOTE — Telephone Encounter (Signed)
Office policy needs to establish with the primary care doctor.  She's only seen NP. And they are out today.  So if needing refills needs to come in.   Dr. Ladona Ridgel

## 2021-02-05 NOTE — Progress Notes (Signed)
[address] [place] [address] Dept: [phone]  FOLLOW UP NOTE  Patient ID: [name], female    DOB: [birth year]  Age: 65 y.o. MRN: [id number] Date of Office Visit: 02/05/2021  Assessment  Chief Complaint: No chief complaint on file.  HPI [name] is a 65 year old female that presents today for an acute visit. She was last seen on 05/26/2020 by Dr. Dellis Anes for moderate persistent asthma and seasonal and perennial allergic rhinitis.  Moderate persistent asthma is reported as not well controlled with Symbicort 80/4.5 mcg 2 puffs twice a day and albuterol. She is not using a spacer with her Symbicort. She reports that for the past month she has had a dry cough, wheezing, tightness in chest, nocturnal awakenings, and shortness of breath. She is using her albuterol 2 times a day. She received a steroid taper form her primary care physican on 01/23/21 and she did not feel a whole lot better. She also received another steroid in December 2021 for costochondritis. She denies any history of blood clots, but reports that her daughter had a blood clot in the past from too much estrogen. She also denies any recent surgery or long distance travel.  Seasonal and perennial allergic rhinitis is reported as not well controlled with Flonase 2 sprays each nostril once a day. She is not able to use antihistamines due to them being overly drying. She is interested in starting allergy injections. She reports clear rhinorrhea, nasal congestion at times, and a sore thorat in the morning. She denies post nasal drip and any sinus infections since we last saw her.   Drug Allergies:  Allergies  Allergen Reactions  . Penicillins Hives    Review of Systems: Review of Systems  Constitutional: Negative for chills and fever.  HENT:       Reports clear rhinorrhea, sore throat in the morning, and nasal congestion at times. Denies post nasal drip  Eyes:       Reports occasional  itchy watery eyes  Respiratory: Positive for cough, shortness of breath and wheezing.   Cardiovascular: Negative for chest pain and palpitations.  Gastrointestinal: Positive for abdominal pain. Negative for heartburn.       Reports abdominal pain due to bacterial overgrowth. Currently seeing Dr. Darnelle Going (GI)  Genitourinary: Negative for dysuria.  Skin: Negative for itching.  Neurological: Positive for headaches.       Reports occasional headaches  Endo/Heme/Allergies: Positive for environmental allergies.    Physical Exam: BP 124/80 (BP Location: Left Arm, Patient Position: Sitting, Cuff Size: Normal)   Pulse 88   Resp 16   SpO2 95%    Physical Exam Constitutional:      Appearance: Normal appearance.  HENT:     Head: Normocephalic and atraumatic.     Comments: Pharynx normal. Eyes normal. Ears normal. Nose: bilateral lower turbinates slightly edematous and slightly erythematous with no drainage noted    Right Ear: Tympanic membrane, ear canal and external ear normal.     Left Ear: Tympanic membrane, ear canal and external ear normal.     Mouth/Throat:     Mouth: Mucous membranes are moist.     Pharynx: Oropharynx is clear.  Eyes:     Conjunctiva/sclera: Conjunctivae normal.  Cardiovascular:     Rate and Rhythm: Regular rhythm.     Heart sounds: Normal heart sounds.  Pulmonary:     Effort: Pulmonary effort is normal.     Breath sounds: Normal breath sounds.  Comments: Lungs clear to auscultation Musculoskeletal:     Cervical back: Neck supple.  Skin:    General: Skin is warm.  Neurological:     Mental Status: She is alert and oriented to person, place, and time.  Psychiatric:        Mood and Affect: Mood normal.        Behavior: Behavior normal.        Thought Content: Thought content normal.        Judgment: Judgment normal.     Diagnostics:  FVC 2.38 L, FEV1 2.05 L. Predicted FVC 2.93 L, FEV1 2.25 L. Spirometry indicates normal ventilatory function  Assessment  and Plan: 1. Not well controlled moderate persistent asthma   2. Shortness of breath   3. Seasonal and perennial allergic rhinitis     Meds ordered this encounter  Medications  . budesonide-formoterol (SYMBICORT) 160-4.5 MCG/ACT inhaler    Sig: Inhale 2 puffs into the lungs 2 (two) times daily.    Dispense:  1 each    Refill:  3    Patient Instructions  Moderate persistent asthma Get STAT d- dimer. We [name] call you with results Stop Symbicort 80/4.5 mcg  Start Symbicort 160/4.5 mcg 2 puffs twice a day with spacer May use albuterol 2 puffs every 4-6 hours as needed for cough, wheeze, tightness in chest, shortness of breath Also, may use albuterol 2 puffs 5 to 15 minutes prior to exercise Asthma control goals:   Full participation in all desired activities (may need albuterol before activity)  Albuterol use two time or less a week on average (not counting use with activity)  Cough interfering with sleep two time or less a month  Oral steroids no more than once a year  No hospitalizations  Seasonal and perennial allergic rhinitis Continue Flonase 2 sprays each nostril once a day as needed for stuffiness Consider allergy injections since you are not able to tolerate antihistamine (overly drying) Once we get your breathing under control we [name] start your allergy injections  Please let us know if this treatment plan is not working well for you. Schedule a follow-up appointment in 2 weeks or sooner   Return in about 2 weeks (around 02/19/2021), or if symptoms worsen or fail to improve.    Thank you for the opportunity to care for this patient.  Please do not hesitate to contact me with questions.  Nehemiah Settle, FNP [facility]

## 2021-02-05 NOTE — Telephone Encounter (Signed)
Patient notified and stated she will call back when she gets home to schedule an office visit with Dr Ladona Ridgel.

## 2021-02-06 ENCOUNTER — Telehealth: Payer: Self-pay

## 2021-02-06 LAB — D-DIMER, QUANTITATIVE: D-DIMER: 0.3 mg/L FEU (ref 0.00–0.49)

## 2021-02-06 NOTE — Progress Notes (Signed)
Please let [name] know that her D-Dimer was negative- meaning low probability of her having a blood clot. Make sure she has started Symbicort 160/4.5 mcg 2 puffs twice a day with spacer. Also, have her give [name] a call if she is not getting any better. Thank you!

## 2021-02-06 NOTE — Progress Notes (Signed)
Thank you :)

## 2021-02-06 NOTE — Telephone Encounter (Signed)
Due to Epic error.  So I am resending message.  Patient was notified of lab results.  Patient was scheduled a 2 week follow up with [facility] 02/19/2021 @ 11:00

## 2021-02-17 ENCOUNTER — Ambulatory Visit: Payer: PPO | Admitting: Family Medicine

## 2021-02-17 DIAGNOSIS — E039 Hypothyroidism, unspecified: Secondary | ICD-10-CM | POA: Diagnosis not present

## 2021-02-17 DIAGNOSIS — E7849 Other hyperlipidemia: Secondary | ICD-10-CM | POA: Diagnosis not present

## 2021-02-17 DIAGNOSIS — I10 Essential (primary) hypertension: Secondary | ICD-10-CM | POA: Diagnosis not present

## 2021-02-18 ENCOUNTER — Ambulatory Visit: Payer: PPO | Admitting: Family Medicine

## 2021-02-18 DIAGNOSIS — H906 Mixed conductive and sensorineural hearing loss, bilateral: Secondary | ICD-10-CM | POA: Diagnosis not present

## 2021-02-18 LAB — CBC WITH DIFFERENTIAL/PLATELET
Basophils Absolute: 0.1 10*3/uL (ref 0.0–0.2)
Basos: 1 %
EOS (ABSOLUTE): 0.2 10*3/uL (ref 0.0–0.4)
Eos: 4 %
Hematocrit: 43.3 % (ref 34.0–46.6)
Hemoglobin: 14.6 g/dL (ref 11.1–15.9)
Immature Grans (Abs): 0 10*3/uL (ref 0.0–0.1)
Immature Granulocytes: 0 %
Lymphocytes Absolute: 2.3 10*3/uL (ref 0.7–3.1)
Lymphs: 40 %
MCH: 31.6 pg (ref 26.6–33.0)
MCHC: 33.7 g/dL (ref 31.5–35.7)
MCV: 94 fL (ref 79–97)
Monocytes Absolute: 0.5 10*3/uL (ref 0.1–0.9)
Monocytes: 9 %
Neutrophils Absolute: 2.6 10*3/uL (ref 1.4–7.0)
Neutrophils: 46 %
Platelets: 277 10*3/uL (ref 150–450)
RBC: 4.62 x10E6/uL (ref 3.77–5.28)
RDW: 12.8 % (ref 11.7–15.4)
WBC: 5.7 10*3/uL (ref 3.4–10.8)

## 2021-02-18 LAB — COMPREHENSIVE METABOLIC PANEL
ALT: 20 IU/L (ref 0–32)
AST: 16 IU/L (ref 0–40)
Albumin/Globulin Ratio: 1.4 (ref 1.2–2.2)
Albumin: 4.2 g/dL (ref 3.8–4.8)
Alkaline Phosphatase: 64 IU/L (ref 44–121)
BUN/Creatinine Ratio: 16 (ref 12–28)
BUN: 14 mg/dL (ref 8–27)
Bilirubin Total: <0.2 mg/dL (ref 0.0–1.2)
CO2: 23 mmol/L (ref 20–29)
Calcium: 9.4 mg/dL (ref 8.7–10.3)
Chloride: 104 mmol/L (ref 96–106)
Creatinine, Ser: 0.88 mg/dL (ref 0.57–1.00)
Globulin, Total: 2.9 g/dL (ref 1.5–4.5)
Glucose: 97 mg/dL (ref 65–99)
Potassium: 4.6 mmol/L (ref 3.5–5.2)
Sodium: 141 mmol/L (ref 134–144)
Total Protein: 7.1 g/dL (ref 6.0–8.5)
eGFR: 73 mL/min/{1.73_m2} (ref >59)

## 2021-02-18 LAB — LIPID PANEL
Chol/HDL Ratio: 4.2 ratio (ref 0.0–4.4)
Cholesterol, Total: 220 mg/dL — ABNORMAL HIGH (ref 100–199)
HDL: 53 mg/dL (ref >39)
LDL Chol Calc (NIH): 148 mg/dL — ABNORMAL HIGH (ref 0–99)
Triglycerides: 105 mg/dL (ref 0–149)
VLDL Cholesterol Cal: 19 mg/dL (ref 5–40)

## 2021-02-18 LAB — TSH: TSH: 1.77 u[IU]/mL (ref 0.450–4.500)

## 2021-02-18 NOTE — Patient Instructions (Addendum)
Moderate persistent asthma Continue Symbicort 160/4.5 mcg 2 puffs twice a day with spacer to help prevent cough and wheeze May use albuterol 2 puffs every 4-6 hours as needed for cough, wheeze, tightness in chest, shortness of breath Also, may use albuterol 2 puffs 5 to 15 minutes prior to exercise Asthma control goals:   Full participation in all desired activities (may need albuterol before activity)  Albuterol use two time or less a week on average (not counting use with activity)  Cough interfering with sleep two time or less a month  Oral steroids no more than once a year  No hospitalizations  Seasonal and perennial allergic rhinitis Continue Flonase 2 sprays each nostril once a day as needed for nasal stuffiness Consider allergy injections since you are not able to tolerate antihistamine (overly drying) Consent signed. Demonstration of EpiPen given and prescription sent   Please let us know if this treatment plan is not working well for you. Schedule a follow-up appointment in 3 months or sooner

## 2021-02-19 ENCOUNTER — Ambulatory Visit: Payer: PPO | Admitting: Family

## 2021-02-19 ENCOUNTER — Encounter: Payer: Self-pay | Admitting: Family

## 2021-02-19 ENCOUNTER — Other Ambulatory Visit: Payer: Self-pay

## 2021-02-19 VITALS — BP 124/76 | HR 73 | Temp 98.3°F | Resp 17 | Wt 172.7 lb

## 2021-02-19 DIAGNOSIS — J302 Other seasonal allergic rhinitis: Secondary | ICD-10-CM

## 2021-02-19 DIAGNOSIS — J4540 Moderate persistent asthma, uncomplicated: Secondary | ICD-10-CM

## 2021-02-19 DIAGNOSIS — J3089 Other allergic rhinitis: Secondary | ICD-10-CM

## 2021-02-19 MED ORDER — ALBUTEROL SULFATE HFA 108 (90 BASE) MCG/ACT IN AERS: 2.0000 | INHALATION_SPRAY | RESPIRATORY_TRACT | 1 refills | Status: DC | PRN

## 2021-02-19 MED ORDER — EPINEPHRINE 0.3 MG/0.3ML IJ SOAJ: 0.3000 mg | INTRAMUSCULAR | 1 refills | Status: DC | PRN

## 2021-02-19 NOTE — Progress Notes (Signed)
[address] [place] [address] Dept: [phone]  FOLLOW UP NOTE  Patient ID: [name], female    DOB: February 08, 1956  Age: 65 y.o. MRN: [id number] Date of Office Visit: 02/19/2021  Assessment  Chief Complaint: Asthma and Allergic Rhinitis   HPI [name] is a 65 year old female who presents today for follow-up of not well controlled moderate persistent asthma, shortness of breath, and seasonal and perennial allergic rhinitis.  She was last seen on February 05, 2021 by Nehemiah Settle, FNP.  Not well controlled moderate persistent asthma is reported as being quite a bit better since switching to Symbicort 160/4.5 mcg 2 puffs twice a day with a spacer and albuterol as needed.  She reports occasional coughing, occasional wheezing, tightness in her chest that occurred yesterday, and occasional nocturnal awakenings.  She denies any shortness of breath.  Since her last office visit she has not required any systemic steroids or made any trips to the emergency room or urgent care due to breathing problems.  She did mention yesterday she woke up at 8 AM with coughing.  She drank some water and this got better.  Then at 9 AM she had a headache and tightness in her chest that she took Zyrtec, a breathing treatment, and used her Symbicort and her symptoms eventually went away.  She is not having any of these symptoms today.  Shortness of breath is reported as better since starting Symbicort 160/4.5 mcg 2 puffs twice a day.  She reports that she will have shortness of breath when exerting herself.  Her D-dimer at her last office visit was negative.  Seasonal and perennial allergic rhinitis is reported as moderately controlled with Flonase nasal spray as needed.  She is not able to tolerate antihistamines due to them being overly drying.  She reports clear rhinorrhea and postnasal drip.  She denies any nasal congestion.  She is interested in starting allergy injections.   Drug  Allergies:  Allergies  Allergen Reactions  . Penicillins Hives    Review of Systems: Review of Systems  Constitutional: Negative for chills and fever.  HENT:       Reports postnasal drip and clear rhinorrhea.  Denies nasal congestion  Eyes:       Reports occasional itchy watery eyes  Respiratory:       Reports occasional cough, wheeze, tightness in chest.  Denies shortness of breath.  Cardiovascular: Negative for chest pain and palpitations.  Gastrointestinal: Negative for heartburn.  Genitourinary: Negative for dysuria.  Skin: Negative for itching and rash.  Neurological: Negative for headaches.  Endo/Heme/Allergies: Positive for environmental allergies.    Physical Exam: BP 124/76   Pulse 73   Temp 98.3 F (36.8 C)   Resp 17   Wt 172 lb 11.2 oz (78.3 kg)   SpO2 96%   BMI 31.59 kg/m    Physical Exam Constitutional:      Appearance: Normal appearance.  HENT:     Head: Normocephalic and atraumatic.     Comments: Pharynx normal, eyes normal, ears normal, nose bilateral lower turbinate mildly edematous and slightly erythematous with no drainage noted.    Right Ear: Tympanic membrane, ear canal and external ear normal.     Left Ear: Tympanic membrane, ear canal and external ear normal.     Mouth/Throat:     Mouth: Mucous membranes are moist.     Pharynx: Oropharynx is clear.  Eyes:     Conjunctiva/sclera: Conjunctivae normal.  Cardiovascular:  Rate and Rhythm: Regular rhythm.     Heart sounds: Normal heart sounds.  Pulmonary:     Effort: Pulmonary effort is normal.     Breath sounds: Normal breath sounds.     Comments: Lungs clear to auscultation Skin:    General: Skin is warm.  Neurological:     Mental Status: She is alert and oriented to person, place, and time.  Psychiatric:        Mood and Affect: Mood normal.        Thought Content: Thought content normal.        Judgment: Judgment normal.     Diagnostics: FVC 2.25 L, FEV1 1.77 L.  Predicted FVC  2.93 L, FEV1 2.24 L.  Spirometry indicates mild restriction.  Status post bronchodilator response shows FVC 2.23 L, FEV1 1.81 L.  Spirometry indicates mild restriction with no significant bronchodilator response.  Assessment and Plan: 1. Moderate persistent asthma, uncomplicated   2. Seasonal and perennial allergic rhinitis     Meds ordered this encounter  Medications  . albuterol (VENTOLIN HFA) 108 (90 Base) MCG/ACT inhaler    Sig: Inhale 2 puffs into the lungs every 4 (four) hours as needed for wheezing or shortness of breath.    Dispense:  8 g    Refill:  1  . EPINEPHrine 0.3 mg/0.3 mL IJ SOAJ injection    Sig: Inject 0.3 mg into the muscle as needed for anaphylaxis.    Dispense:  1 each    Refill:  1    Patient Instructions  Moderate persistent asthma Continue Symbicort 160/4.5 mcg 2 puffs twice a day with spacer to help prevent cough and wheeze May use albuterol 2 puffs every 4-6 hours as needed for cough, wheeze, tightness in chest, shortness of breath Also, may use albuterol 2 puffs 5 to 15 minutes prior to exercise Asthma control goals:   Full participation in all desired activities (may need albuterol before activity)  Albuterol use two time or less a week on average (not counting use with activity)  Cough interfering with sleep two time or less a month  Oral steroids no more than once a year  No hospitalizations  Seasonal and perennial allergic rhinitis Continue Flonase 2 sprays each nostril once a day as needed for nasal stuffiness Consider allergy injections since you are not able to tolerate antihistamine (overly drying) Consent signed. Demonstration of EpiPen given and prescription sent   Please let us know if this treatment plan is not working well for you. Schedule a follow-up appointment in 3 months or sooner   Return in about 3 months (around 05/22/2021), or if symptoms worsen or fail to improve, for Also 3-week appointment to start allergy injections.     Thank you for the opportunity to care for this patient.  Please do not hesitate to contact me with questions.  Nehemiah Settle, FNP [facility]

## 2021-02-20 NOTE — Addendum Note (Signed)
Addended by: Alfonse Spruce on: 02/20/2021 05:02 PM   Modules accepted: Orders

## 2021-02-24 DIAGNOSIS — J3089 Other allergic rhinitis: Secondary | ICD-10-CM | POA: Diagnosis not present

## 2021-02-24 NOTE — Progress Notes (Signed)
VIALS EXP 02-24-21

## 2021-02-24 NOTE — Progress Notes (Signed)
Aeroallergen Immunotherapy    Patient Details  Name: [name]  MRN: [id number]  Date of Birth: 03-01-1956   Order 1 of 1   Vial Label: W/RW/T/DM   0.3 ml (Volume) 1:20 Concentration -- Ragweed Mix  0.5 ml (Volume) 1:20 Concentration -- Weed Mix*  0.5 ml (Volume) 1:20 Concentration -- Eastern 10 Tree Mix (also Sweet Gum)  0.5 ml (Volume)  AU Concentration -- Mite Mix (DF 5,000 & DP 5,000)    1.8 ml Extract Subtotal  3.2 ml Diluent  5.0 ml Maintenance Total    Final Concentration above is stated in weight/volume (wt/vol). Allergen units (AU/ml) biological units (BAU/ml). The total volume is 5 ml.    Schedule: A   Special Instructions: none

## 2021-02-25 ENCOUNTER — Telehealth: Payer: Self-pay

## 2021-02-25 ENCOUNTER — Ambulatory Visit: Payer: PPO | Admitting: Family Medicine

## 2021-02-25 DIAGNOSIS — F321 Major depressive disorder, single episode, moderate: Secondary | ICD-10-CM

## 2021-02-25 DIAGNOSIS — F419 Anxiety disorder, unspecified: Secondary | ICD-10-CM

## 2021-02-25 MED ORDER — LEVOTHYROXINE SODIUM 150 MCG PO TABS: ORAL_TABLET | ORAL | 0 refills | Status: DC

## 2021-02-25 MED ORDER — LISINOPRIL 20 MG PO TABS: ORAL_TABLET | ORAL | 0 refills | Status: DC

## 2021-02-25 MED ORDER — SERTRALINE HCL 100 MG PO TABS: 200.0000 mg | ORAL_TABLET | Freq: Every day | ORAL | 0 refills | Status: DC

## 2021-02-25 NOTE — Telephone Encounter (Signed)
Pt needs refill on levothyroxine (SYNTHROID) 150 MCG tablet,  lisinopril (ZESTRIL) 20 MG tablet,sertraline (ZOLOFT) 100 MG tablet needs 90 day supply insurance will cover sent [facility] - [place], [place] - [address]  Pt has had several appts scheduled to gets meds and they are reschedule due to Dr out each time   Pt call [phone]

## 2021-02-25 NOTE — Telephone Encounter (Signed)
Refills sent to pharmacy. Tried to call patient but no answer and no voicemail available.

## 2021-02-25 NOTE — Telephone Encounter (Signed)
Pls give 90 day supply of the meds and no refills. Thx. Dr. Ladona Ridgel

## 2021-02-25 NOTE — Telephone Encounter (Signed)
Please advise. Thank you

## 2021-02-26 NOTE — Telephone Encounter (Signed)
Tried to contact pt but cell phone is not accepting call. Called pt on home number and informed her that refills were sent. Pt verbalized understanding.

## 2021-03-04 ENCOUNTER — Ambulatory Visit: Payer: PPO | Admitting: Family Medicine

## 2021-03-07 ENCOUNTER — Other Ambulatory Visit: Payer: Self-pay

## 2021-03-07 ENCOUNTER — Ambulatory Visit (INDEPENDENT_AMBULATORY_CARE_PROVIDER_SITE_OTHER): Payer: PPO | Admitting: Nurse Practitioner

## 2021-03-07 VITALS — BP 122/82 | Ht 62.0 in | Wt 174.8 lb

## 2021-03-07 DIAGNOSIS — F321 Major depressive disorder, single episode, moderate: Secondary | ICD-10-CM

## 2021-03-07 DIAGNOSIS — R5383 Other fatigue: Secondary | ICD-10-CM | POA: Diagnosis not present

## 2021-03-07 DIAGNOSIS — R7303 Prediabetes: Secondary | ICD-10-CM | POA: Diagnosis not present

## 2021-03-07 DIAGNOSIS — E7849 Other hyperlipidemia: Secondary | ICD-10-CM | POA: Diagnosis not present

## 2021-03-07 DIAGNOSIS — F419 Anxiety disorder, unspecified: Secondary | ICD-10-CM | POA: Diagnosis not present

## 2021-03-07 DIAGNOSIS — E039 Hypothyroidism, unspecified: Secondary | ICD-10-CM

## 2021-03-07 DIAGNOSIS — I10 Essential (primary) hypertension: Secondary | ICD-10-CM

## 2021-03-07 DIAGNOSIS — R0683 Snoring: Secondary | ICD-10-CM | POA: Diagnosis not present

## 2021-03-07 MED ORDER — ROSUVASTATIN CALCIUM 5 MG PO TABS: ORAL_TABLET | ORAL | 2 refills | Status: DC

## 2021-03-07 MED ORDER — LEVOTHYROXINE SODIUM 150 MCG PO TABS: ORAL_TABLET | ORAL | 3 refills | Status: DC

## 2021-03-07 MED ORDER — SERTRALINE HCL 100 MG PO TABS: 200.0000 mg | ORAL_TABLET | Freq: Every day | ORAL | 1 refills | Status: DC

## 2021-03-07 NOTE — Progress Notes (Signed)
   Subjective:    Patient ID: [name], female    DOB: 05/09/1956, 65 y.o.   MRN: [id number]  Hypertension This is a chronic problem. The current episode started more than 1 year ago. Risk factors for coronary artery disease include post-menopausal state. Treatments tried: lisinopril. There are no compliance problems.       Review of Systems     Objective:   Physical Exam        Assessment & Plan:

## 2021-03-07 NOTE — Progress Notes (Signed)
Subjective:    Patient ID: [name], female    DOB: [birth year], 65 y.o.   MRN: [id number]  HPI  Patient came for 90 days refill for levothyroxine and Zoloft. And would like to review recent lab results.   Patient does have some concerns of dry mouth. States that she walks up severely dry every night. This has been an on going problem for a while now. Has tried OTC products but states they do not work. Pt does sleep with mouth open as well. Her husband has noted some light snoring and some brief pauses in her breathing. Having some fatigue.    Review of Systems  Constitutional: Positive for fatigue. Negative for activity change, appetite change, chills, diaphoresis, fever and unexpected weight change.  HENT: Negative for sore throat and trouble swallowing.        Dry mouth.   Respiratory: Negative for cough, chest tightness, shortness of breath and wheezing.   Cardiovascular: Negative for chest pain and palpitations.  Gastrointestinal: Negative for abdominal distention, constipation, diarrhea, nausea and vomiting.  Genitourinary: Negative.    Flowsheet Row Office Visit from 03/07/2021 in [facility] Office Visit from 01/20/2021 in [facility] Office Visit from 10/02/2020 in [facility]  Thoughts that you would be better off dead, or of hurting yourself in some way Not at all Not at all Not at all  PHQ-9 Total Score 5 11 10       Questionnaire for sleep apnea  Category 1: score of 2= positive Category 2: score of 2=positive  Category 3: Yes= positive  High risk for sleep apnea    Objective:   Physical Exam Constitutional:      General: She is not in acute distress.    Appearance: Normal appearance. She is not ill-appearing.  Neck:     Comments: Thyroid non tender to palpation; no mass or goiter noted.  Cardiovascular:     Rate and Rhythm: Normal rate and regular rhythm.     Heart sounds: Normal heart sounds.  Pulmonary:      Effort: Pulmonary effort is normal.     Breath sounds: Normal breath sounds.     Comments: Mildly diminished breath sounds but clear.  Musculoskeletal:     Cervical back: Neck supple.  Lymphadenopathy:     Cervical: No cervical adenopathy.  Neurological:     Mental Status: She is alert and oriented to person, place, and time.  Psychiatric:        Mood and Affect: Mood normal.        Behavior: Behavior normal.        Thought Content: Thought content normal.        Judgment: Judgment normal.     Today's Vitals   03/07/21 1458  BP: 122/82  Weight: 174 lb 12.8 oz (79.3 kg)  Height: 5\' 2"  (1.575 m)   Body mass index is 31.97 kg/m.      Assessment & Plan:   Problem List Items Addressed This Visit      Cardiovascular and Mediastinum   Hypertension - Primary   Relevant Medications   rosuvastatin (CRESTOR) 5 MG tablet   Other Relevant Orders   Hepatic function panel     Endocrine   Hypothyroidism   Relevant Medications   levothyroxine (SYNTHROID) 150 MCG tablet     Other   Anxiety   Relevant Medications   sertraline (ZOLOFT) 100 MG tablet   Depression   Relevant Medications   sertraline (  ZOLOFT) 100 MG tablet   Fatigue   Hyperlipidemia   Relevant Medications   rosuvastatin (CRESTOR) 5 MG tablet   Other Relevant Orders   Lipid panel   Prediabetes   Relevant Orders   Hemoglobin A1c    Other Visit Diagnoses    Snoring           Meds ordered this encounter  Medications  . sertraline (ZOLOFT) 100 MG tablet    Sig: Take 2 tablets (200 mg total) by mouth daily.    Dispense:  180 tablet    Refill:  1    Order Specific Question:   Supervising Provider    Answer:   Lilyan Punt A [[id number]]  . levothyroxine (SYNTHROID) 150 MCG tablet    Sig: TAKE 1 AND 1/2 TABLETS BY MOUTH ON SUNDAY; 1 TABLET DAILY EVERY OTHER DAY.    Dispense:  90 tablet    Refill:  3    Order Specific Question:   Supervising Provider    Answer:   Lilyan Punt A [[id number]]  . rosuvastatin  (CRESTOR) 5 MG tablet    Sig: Take 1 tablet PO QD on Monday, Wednesday, and Friday.    Dispense:  12 tablet    Refill:  2    Order Specific Question:   Supervising Provider    Answer:   Lilyan Punt A [[id number]]     The 10-year ASCVD risk score Denman George DC Jr., et al., 2013) is: 6.6%   Values used to calculate the score:     Age: 72 years     Sex: Female     Is Non-Hispanic African American: No     Diabetic: No     Tobacco smoker: No     Systolic Blood Pressure: 122 mmHg     Is BP treated: Yes     HDL Cholesterol: 53 mg/dL     Total Cholesterol: 220 mg/dL   -Reviewed lab work with patient   -Sleep apnea screening was performed. Will refer for HBST.  -Cool mist humidifier may be beneficial and help to moisten the air breathing in while sleeping.  -Spoke to patient about hyperlipidemia and starting on low dose Crestor 5mg ,  3 times per week to reduce risk.  Repeat labs in 8-10 weeks.   Return in about 6 months (around 09/06/2021).

## 2021-03-08 ENCOUNTER — Encounter: Payer: Self-pay | Admitting: Nurse Practitioner

## 2021-03-14 ENCOUNTER — Telehealth: Payer: Self-pay

## 2021-03-14 ENCOUNTER — Other Ambulatory Visit: Payer: Self-pay | Admitting: Nurse Practitioner

## 2021-03-14 ENCOUNTER — Other Ambulatory Visit: Payer: Self-pay

## 2021-03-14 ENCOUNTER — Ambulatory Visit (INDEPENDENT_AMBULATORY_CARE_PROVIDER_SITE_OTHER): Payer: PPO

## 2021-03-14 DIAGNOSIS — R5383 Other fatigue: Secondary | ICD-10-CM

## 2021-03-14 DIAGNOSIS — R0683 Snoring: Secondary | ICD-10-CM

## 2021-03-14 DIAGNOSIS — J309 Allergic rhinitis, unspecified: Secondary | ICD-10-CM

## 2021-03-14 NOTE — Telephone Encounter (Signed)
Jill Stopka called said [name] suppose to order a sleep study sent to her house and she has not got anything yet is this supposed to go through [facility] or nurses? I did not see nothing under referrals

## 2021-03-14 NOTE — Telephone Encounter (Signed)
Order put in for HBST at [facility]

## 2021-03-17 ENCOUNTER — Other Ambulatory Visit: Payer: Self-pay | Admitting: Family Medicine

## 2021-03-17 ENCOUNTER — Other Ambulatory Visit: Payer: Self-pay | Admitting: *Deleted

## 2021-03-17 DIAGNOSIS — G4730 Sleep apnea, unspecified: Secondary | ICD-10-CM

## 2021-03-17 NOTE — Telephone Encounter (Signed)
Order put in and pt was notified.  ?

## 2021-03-27 DIAGNOSIS — H906 Mixed conductive and sensorineural hearing loss, bilateral: Secondary | ICD-10-CM | POA: Diagnosis not present

## 2021-03-28 ENCOUNTER — Ambulatory Visit (INDEPENDENT_AMBULATORY_CARE_PROVIDER_SITE_OTHER): Payer: PPO | Admitting: *Deleted

## 2021-03-28 DIAGNOSIS — J309 Allergic rhinitis, unspecified: Secondary | ICD-10-CM | POA: Diagnosis not present

## 2021-04-02 ENCOUNTER — Ambulatory Visit (INDEPENDENT_AMBULATORY_CARE_PROVIDER_SITE_OTHER): Payer: PPO

## 2021-04-02 DIAGNOSIS — J309 Allergic rhinitis, unspecified: Secondary | ICD-10-CM | POA: Diagnosis not present

## 2021-04-16 ENCOUNTER — Ambulatory Visit (INDEPENDENT_AMBULATORY_CARE_PROVIDER_SITE_OTHER): Payer: PPO

## 2021-04-16 DIAGNOSIS — J309 Allergic rhinitis, unspecified: Secondary | ICD-10-CM

## 2021-04-23 ENCOUNTER — Ambulatory Visit (INDEPENDENT_AMBULATORY_CARE_PROVIDER_SITE_OTHER): Payer: PPO

## 2021-04-23 DIAGNOSIS — J309 Allergic rhinitis, unspecified: Secondary | ICD-10-CM

## 2021-04-24 ENCOUNTER — Other Ambulatory Visit: Payer: Self-pay | Admitting: Allergy & Immunology

## 2021-04-30 ENCOUNTER — Ambulatory Visit (INDEPENDENT_AMBULATORY_CARE_PROVIDER_SITE_OTHER): Payer: PPO

## 2021-04-30 DIAGNOSIS — J309 Allergic rhinitis, unspecified: Secondary | ICD-10-CM | POA: Diagnosis not present

## 2021-05-06 ENCOUNTER — Other Ambulatory Visit: Payer: Self-pay | Admitting: Nurse Practitioner

## 2021-05-09 ENCOUNTER — Ambulatory Visit: Payer: PPO | Admitting: Nurse Practitioner

## 2021-05-13 ENCOUNTER — Ambulatory Visit
Admission: EM | Admit: 2021-05-13 | Discharge: 2021-05-13 | Disposition: A | Discharge status: Home / Self Care | Payer: PPO | Attending: Internal Medicine | Admitting: Internal Medicine

## 2021-05-13 ENCOUNTER — Ambulatory Visit (INDEPENDENT_AMBULATORY_CARE_PROVIDER_SITE_OTHER): Payer: PPO

## 2021-05-13 ENCOUNTER — Other Ambulatory Visit: Payer: Self-pay

## 2021-05-13 ENCOUNTER — Encounter: Payer: Self-pay | Admitting: Emergency Medicine

## 2021-05-13 DIAGNOSIS — J4531 Mild persistent asthma with (acute) exacerbation: Secondary | ICD-10-CM

## 2021-05-13 DIAGNOSIS — R509 Fever, unspecified: Secondary | ICD-10-CM | POA: Diagnosis not present

## 2021-05-13 DIAGNOSIS — J209 Acute bronchitis, unspecified: Secondary | ICD-10-CM | POA: Diagnosis not present

## 2021-05-13 DIAGNOSIS — Z7689 Persons encountering health services in other specified circumstances: Secondary | ICD-10-CM | POA: Diagnosis not present

## 2021-05-13 DIAGNOSIS — R059 Cough, unspecified: Secondary | ICD-10-CM

## 2021-05-13 LAB — POCT INFLUENZA A/B
Influenza A, POC: NEGATIVE
Influenza B, POC: NEGATIVE

## 2021-05-13 MED ORDER — PREDNISONE 20 MG PO TABS: 20.0000 mg | ORAL_TABLET | Freq: Every day | ORAL | 0 refills | Status: DC

## 2021-05-13 MED ORDER — AZITHROMYCIN 250 MG PO TABS: ORAL_TABLET | ORAL | 0 refills | Status: DC

## 2021-05-13 MED ORDER — HYDROCOD POLST-CPM POLST ER 10-8 MG/5ML PO SUER: 5.0000 mL | Freq: Two times a day (BID) | ORAL | 0 refills | Status: DC | PRN

## 2021-05-13 MED ORDER — METHYLPREDNISOLONE SODIUM SUCC 40 MG IJ SOLR
40.0000 mg | Freq: Once | INTRAMUSCULAR | Status: AC
  Administered 2021-05-13: 40 mg via INTRAMUSCULAR

## 2021-05-13 NOTE — ED Provider Notes (Signed)
[facility]    CSN: [id number] Arrival date & time: 05/13/21  1619      History   Chief Complaint No chief complaint on file.   HPI Audrey DoughtyCathy H [name] is a 65 y.o. female who presents with a cough which started yesterday during the day gradually, and been getting worse since. Is having cough attacks and her inhaler is not helping. She is not a smoker. She feels SOB. Has a HA and Tylenol has not helped. Her upper arms are feeling a little achy.   Had a negative in home covid test She has not had covid infection or the injections.   Past Medical History:  Diagnosis Date  . Anxiety   . Arthritis   . Depression   . GERD (gastroesophageal reflux disease)   . Hypertension   . Hypothyroid   . Hypothyroidism   . Moderate persistent asthma 01/20/2021  . PONV (postoperative nausea and vomiting)   . Recurrent upper respiratory infection (URI)   . Urticaria     Patient Active Problem List   Diagnosis Date Noted  . Bronchitis 01/22/2021  . Moderate persistent asthma with exacerbation 01/20/2021  . Close exposure to COVID-19 virus 01/20/2021  . Abdominal pain, epigastric 10/16/2019  . Abdominal bloating 10/16/2019  . Prediabetes 12/13/2017  . Hyperlipidemia 12/04/2016  . Bereavement 09/15/2016  . Fever blister 06/24/2016  . Rosacea 03/07/2014  . Chronic low back pain 10/22/2013  . Morbid obesity (HCC) 10/03/2013  . Anxiety 08/23/2013  . Depression 05/15/2013  . Fatigue 05/15/2013  . Urinary incontinence in female 02/23/2013  . GERD (gastroesophageal reflux disease) 09/19/2012  . Intestinal bacterial overgrowth 07/25/2012  . Hypertension 07/25/2012  . Hypothyroidism 07/25/2012  . Constipation 07/25/2012  . CTS (carpal tunnel syndrome) 04/21/2012  . Trigger finger 04/21/2012    Past Surgical History:  Procedure Laterality Date  . BACK SURGERY    . BIOPSY  06/03/2020   Procedure: BIOPSY;  Surgeon: Malissa Hippoehman, Najeeb U, MD;  Location: [facility];  Service:  Endoscopy;;  . CARPAL TUNNEL RELEASE  06/16/2012   Procedure: CARPAL TUNNEL RELEASE;  Surgeon: Dominica SeverinWilliam Gramig, MD;  Location: [facility];  Service: Orthopedics;  Laterality: Right;  limited open carpal tunnel release  . CERVICAL FUSION  2018  . CHOLECYSTECTOMY    . COLONOSCOPY    . COLONOSCOPY WITH PROPOFOL N/A 06/03/2020   Procedure: COLONOSCOPY WITH PROPOFOL;  Surgeon: Malissa Hippoehman, Najeeb U, MD;  Location: [facility];  Service: Endoscopy;  Laterality: N/A;  115  . ESOPHAGOGASTRODUODENOSCOPY (EGD) WITH PROPOFOL N/A 06/03/2020   Procedure: ESOPHAGOGASTRODUODENOSCOPY (EGD) WITH PROPOFOL;  Surgeon: Malissa Hippoehman, Najeeb U, MD;  Location: [facility];  Service: Endoscopy;  Laterality: N/A;  . TONSILLECTOMY    . TRIGGER FINGER RELEASE  06/16/2012   Procedure: RELEASE TRIGGER FINGER/A-1 PULLEY;  Surgeon: Dominica SeverinWilliam Gramig, MD;  Location:  SURGERY CENTER;  Service: Orthopedics;  Laterality: Right;  right middle a-1 release  . TUBAL LIGATION    . UPPER GASTROINTESTINAL ENDOSCOPY    . VAGINAL HYSTERECTOMY      OB History   No obstetric history on file.      Home Medications    Prior to Admission medications   Medication Sig Start Date End Date Taking? Authorizing Provider  azithromycin (ZITHROMAX Z-PAK) 250 MG tablet 2 today, then one qd x 4 days 05/13/21  Yes Rodriguez-Southworth, Nettie ElmSylvia, PA-C  chlorpheniramine-HYDROcodone (TUSSIONEX PENNKINETIC ER) 10-8 MG/5ML SUER Take 5 mLs by mouth every 12 (twelve) hours as  needed for cough. 05/13/21  Yes Rodriguez-Southworth, Nettie Elm, PA-C  predniSONE (DELTASONE) 20 MG tablet Take 1 tablet (20 mg total) by mouth daily with breakfast. 05/13/21  Yes Rodriguez-Southworth, Nettie Elm, PA-C  albuterol (PROVENTIL) (2.5 MG/3ML) 0.083% nebulizer solution Use via neb q 4 hrs prn wheezing Patient taking differently: Take 2.5 mg by nebulization every 4 (four) hours as needed for wheezing or shortness of breath. 02/27/20   Merlyn Albert, MD  albuterol  (VENTOLIN HFA) 108 (90 Base) MCG/ACT inhaler Inhale 1-2 puffs into the lungs every 6 (six) hours as needed for wheezing or shortness of breath. Patient not taking: Reported on 02/19/2021 03/11/20   Durward Parcel, FNP  albuterol (VENTOLIN HFA) 108 (90 Base) MCG/ACT inhaler Inhale 2 puffs into the lungs every 4 (four) hours as needed for wheezing or shortness of breath. 02/19/21   Nehemiah Settle, FNP  budesonide-formoterol [facility]) 160-4.5 MCG/ACT inhaler Inhale 2 puffs into the lungs 2 (two) times daily. 02/05/21   Nehemiah Settle, FNP  dicyclomine (BENTYL) 10 MG capsule Take 1 capsule (10 mg total) by mouth 3 (three) times daily as needed (abd pain, diarrhea). 07/31/20   Tawni Pummel B, PA-C  EPINEPHrine 0.3 mg/0.3 mL IJ SOAJ injection Inject 0.3 mg into the muscle as needed for anaphylaxis. 02/19/21   Nehemiah Settle, FNP  fluticasone (FLONASE) 50 MCG/ACT nasal spray INHALE 2 SPRAYS IN EACH NOSTRIL ONCE DAILY. 04/24/21   Nehemiah Settle, FNP  gabapentin (NEURONTIN) 100 MG capsule Take 100 mg by mouth at bedtime as needed (back pain).     [provider]  hydrocortisone 2.5 % cream Apply topically. 12/12/20   [provider]  levothyroxine (SYNTHROID) 150 MCG tablet TAKE 1 AND 1/2 TABLETS BY MOUTH ON SUNDAY; 1 TABLET DAILY EVERY OTHER DAY. 03/07/21   Campbell Riches, NP  lisinopril (ZESTRIL) 20 MG tablet TAKE (1) TABLET BY MOUTH ONCE DAILY. 02/25/21   Laroy Apple M, DO  meloxicam (MOBIC) 7.5 MG tablet Take 7.5 mg by mouth daily.    [provider]  metroNIDAZOLE (METROGEL) 0.75 % gel Apply topically daily. 01/08/21   [provider]  Naphazoline-Pheniramine (OPCON-A) 0.027-0.315 % SOLN Place 1 drop into both eyes daily.    [provider]  pantoprazole (PROTONIX) 40 MG tablet Take 1 tablet (40 mg total) by mouth daily before breakfast. 07/31/20   Tawni Pummel B, PA-C  polyethylene glycol powder (GLYCOLAX/MIRALAX) 17 GM/SCOOP powder Take 17 g by mouth  daily. 06/03/20   Rehman, Joline Maxcy, MD  psyllium (METAMUCIL SMOOTH TEXTURE) 58.6 % powder Take 1 packet by mouth at bedtime. 06/03/20   Rehman, Joline Maxcy, MD  rosuvastatin (CRESTOR) 5 MG tablet TAKE 1 TABLET BY MOUTH ON MONDAY, WEDNESDAY AND FRIDAY. 05/09/21   Campbell Riches, NP  saline (AYR) GEL Place 1 application into both nostrils daily as needed (dryness).    [provider]  sertraline (ZOLOFT) 100 MG tablet Take 2 tablets (200 mg total) by mouth daily. 03/07/21   Campbell Riches, NP  Sodium Fluoride (PREVIDENT 5000 BOOSTER) 1.1 % PSTE Place 1 application onto teeth at bedtime. 10/09/20   Ladona Ridgel, Malena M, DO  valACYclovir (VALTREX) 1000 MG tablet TAKE 1 TABLET BY MOUTH THREE TIMES DAILY FOR 7 DAYS. Patient taking differently: Take 1,000 mg by mouth daily as needed (fever blisters). Take for 5 days at a time 03/25/20   Merlyn Albert, MD    Family History Family History  Problem Relation Age of Onset  . COPD Mother   .  Osteoporosis Mother   . Immunodeficiency Mother   . COPD Father   . Emphysema Father   . Tuberculosis Father   . Lung disease Father   . COPD Sister   . Healthy Daughter   . Healthy Son   . Arthritis Other   . Lung disease Other   . Cancer Other   . Asthma Other   . Eczema Neg Hx   . Atopy Neg Hx   . Angioedema Neg Hx   . Allergic rhinitis Neg Hx     Social History Social History   Tobacco Use  . Smoking status: Former Smoker    Packs/day: 1.50    Years: 27.00    Pack years: 40.50    Start date: 1973    Quit date: 2000    Years since quitting: 22.4  . Smokeless tobacco: Never Used  Vaping Use  . Vaping Use: Never used  Substance Use Topics  . Alcohol use: No  . Drug use: No     Allergies   Penicillins   Review of Systems Review of Systems  Constitutional: Positive for appetite change, fatigue and fever. Negative for chills and diaphoresis.  HENT: Positive for congestion, ear pain, postnasal drip and rhinorrhea.   Respiratory:  Positive for cough, shortness of breath and wheezing.   Musculoskeletal: Positive for myalgias.  Skin: Negative for rash.  Neurological: Positive for headaches.  The rest is negative   Physical Exam Triage Vital Signs ED Triage Vitals  Enc Vitals Group     BP 05/13/21 1725 (!) 164/84     Pulse Rate 05/13/21 1725 (!) 106     Resp 05/13/21 1725 18     Temp 05/13/21 1725 99.9 F (37.7 C)     Temp Source 05/13/21 1725 Temporal     SpO2 05/13/21 1725 97 %     Weight --      Height --      Head Circumference --      Peak Flow --      Pain Score 05/13/21 1727 8     Pain Loc --      Pain Edu? --      Excl. in GC? --    No data found.  Updated Vital Signs BP (!) 164/84 (BP Location: Right Arm)   Pulse (!) 106   Temp 99.9 F (37.7 C) (Temporal)   Resp 18   SpO2 97%   Visual Acuity Right Eye Distance:   Left Eye Distance:   Bilateral Distance:    Right Eye Near:   Left Eye Near:    Bilateral Near:     Physical Exam Physical Exam Constitutional:      General: He is not in acute distress.    Appearance: He is not toxic-appearing.  HENT:     Head: Normocephalic.     Right Ear: Tympanic membrane, ear canal and external ear normal.     Left Ear: Ear canal and external ear normal.     Nose: Nose normal.     Mouth/Throat:     Mouth: Mucous membranes are moist.     Pharynx: Oropharynx is clear.  Eyes:     General: No scleral icterus.    Conjunctiva/sclera: Conjunctivae normal.  Cardiovascular:     Rate and Rhythm: Normal rate and regular rhythm.     Heart sounds: No murmur heard.   Pulmonary:     Effort: Pulmonary effort is normal. No respiratory distress.     Breath sounds:  Wheezing present.     Comments: Has auditory wheezing Musculoskeletal:        General: Normal range of motion.     Cervical back: Neck supple.  Lymphadenopathy:     Cervical: No cervical adenopathy.  Skin:    General: Skin is warm and dry.     Findings: No rash.  Neurological:      Mental Status: He is alert and oriented to person, place, and time.     Gait: Gait normal.  Psychiatric:        Mood and Affect: Mood normal.        Behavior: Behavior normal.        Thought Content: Thought content normal.        Judgment: Judgment normal.     UC Treatments / Results  Labs (all labs ordered are listed, but only abnormal results are displayed) Labs Reviewed  NOVEL CORONAVIRUS, NAA  POCT INFLUENZA A/B   Influenza tests are neg.  EKG   Radiology DG Chest 2 View  Result Date: 05/13/2021 CLINICAL DATA:  Cough and fever. EXAM: CHEST - 2 VIEW COMPARISON:  03/11/2020 FINDINGS: The cardiomediastinal contours are normal. Moderate bronchial thickening. Pulmonary vasculature is normal. No consolidation, pleural effusion, or pneumothorax. No acute osseous abnormalities are seen. Surgical hardware in the lower cervical spine is partially included. IMPRESSION: Bronchial thickening may be due to asthma or bronchitis. No focal airspace disease to suggest pneumonia. Electronically Signed   By: Narda Rutherford M.D.   On: 05/13/2021 18:44    Procedures Procedures (including critical care time)  Medications Ordered in UC Medications  methylPREDNISolone sodium succinate (SOLU-MEDROL) 40 mg/mL injection 40 mg (40 mg Intramuscular Given 05/13/21 1809)    Initial Impression / Assessment and Plan / UC Course  I have reviewed the triage vital signs and the nursing notes. Pertinent labs & imaging results that were available during my care of the patient were reviewed by me and considered in my medical decision making (see chart for details). She was given Solumedrol 40 mg IM here and this helped her cough calm down and felt she could breath better.  I placed her on Zpack, Prednison tablets 20 mg qd to start  Tomorrow and should repeat the covid test in 2 days if not improving.   Final Clinical Impressions(s) / UC Diagnoses   Final diagnoses:  Acute bronchitis, unspecified organism   Mild persistent asthma with acute exacerbation     Discharge Instructions     Repeat the covid test  between day 3-5 of your symptoms If your Covid test ends up positive you may take the following supplements to help your immune system be stronger to fight this viral infection Take Quarcetin 500 mg three times a day x 7 days with Zinc 50 mg ones a day x 7 days. The quarcetin is an antiviral and anti-inflammatory supplement which helps open the zinc channels in the cell to absorb Zinc. Zinc helps decrease the virus load in your body. Take Melatonin 6-10 mg at bed time which also helps support your immune system.  Also make sure to take Vit D 5,000 IU per day with a fatty meal and Vit C 5000 mg a day until you are completely better. To prevent viral illnesses your vitamin D should be between 60-80. Stay on Vitamin D 2,000  and C  1000 mg the rest of the season.  Don't lay around, keep active and walk as much as you are able to to prevent  worsening of your symptoms.  Follow up with your family Dr next week.  If you get short of breath and you are able to check  your oxygen with a pulse oxygen meter, if it gets to 92% or less, you need to go to the hospital to be admitted. If you dont have one, come back here and we will assess you.      ED Prescriptions    Medication Sig Dispense Auth. Provider   predniSONE (DELTASONE) 20 MG tablet Take 1 tablet (20 mg total) by mouth daily with breakfast. 5 tablet Rodriguez-Southworth, Nettie Elm, PA-C   azithromycin (ZITHROMAX Z-PAK) 250 MG tablet 2 today, then one qd x 4 days 6 tablet Rodriguez-Southworth, Elisha Cooksey, PA-C   chlorpheniramine-HYDROcodone (TUSSIONEX PENNKINETIC ER) 10-8 MG/5ML SUER Take 5 mLs by mouth every 12 (twelve) hours as needed for cough. 140 mL Rodriguez-Southworth, Nettie Elm, PA-C     PDMP not reviewed this encounter.   Garey Ham, [place] 05/13/21 1907

## 2021-05-13 NOTE — ED Triage Notes (Signed)
Coughing since yesterday with left sided neck pain. Nasal congestion, states her albuterol is not helping.

## 2021-05-13 NOTE — Discharge Instructions (Addendum)
Repeat the covid test  between day 3-5 of your symptoms If your Covid test ends up positive you may take the following supplements to help your immune system be stronger to fight this viral infection Take Quarcetin 500 mg three times a day x 7 days with Zinc 50 mg ones a day x 7 days. The quarcetin is an antiviral and anti-inflammatory supplement which helps open the zinc channels in the cell to absorb Zinc. Zinc helps decrease the virus load in your body. Take Melatonin 6-10 mg at bed time which also helps support your immune system.  Also make sure to take Vit D 5,000 IU per day with a fatty meal and Vit C 5000 mg a day until you are completely better. To prevent viral illnesses your vitamin D should be between 60-80. Stay on Vitamin D 2,000  and C  1000 mg the rest of the season.  Don't lay around, keep active and walk as much as you are able to to prevent worsening of your symptoms.  Follow up with your family Dr next week.  If you get short of breath and you are able to check  your oxygen with a pulse oxygen meter, if it gets to 92% or less, you need to go to the hospital to be admitted. If you dont have one, come back here and we will assess you.

## 2021-05-14 LAB — NOVEL CORONAVIRUS, NAA: SARS-CoV-2, NAA: NOT DETECTED

## 2021-05-14 LAB — SARS-COV-2, NAA 2 DAY TAT

## 2021-05-15 ENCOUNTER — Institutional Professional Consult (permissible substitution): Payer: PPO | Admitting: Neurology

## 2021-05-21 ENCOUNTER — Other Ambulatory Visit: Payer: Self-pay | Admitting: Family Medicine

## 2021-05-23 ENCOUNTER — Ambulatory Visit: Payer: PPO | Admitting: Allergy & Immunology

## 2021-05-26 ENCOUNTER — Telehealth: Payer: Self-pay

## 2021-05-26 NOTE — Telephone Encounter (Signed)
Patient called because since May up until last month she was dealing with Bronchitis. She has been doing a lot better since her visit at urgent care on 05/13/21.  They gave her prednisone and she completed the mediation, but still has stuffiness in her head. She stated the zyrtec has been helping and she has been using her asthma inhalers. She also stated that allergy injections have not been helping her at all. She would like to know if there is a decongestant that we could send in that she is able to take with her thyroid medication.  She would like it send in to the [facility] in [place]. Please advise.

## 2021-05-26 NOTE — Telephone Encounter (Signed)
Patient called because since May up until last month she was dealing with Bronchitis. She has been doing a lot better since her visit at urgent care on 05/13/21.  They gave her prednisone

## 2021-05-29 MED ORDER — CETIRIZINE-PSEUDOEPHEDRINE ER 5-120 MG PO TB12: 1.0000 | ORAL_TABLET | Freq: Two times a day (BID) | ORAL | 1 refills | Status: AC

## 2021-05-29 NOTE — Telephone Encounter (Signed)
She is still in the [facility], so we would not expect her to have any effectiveness from the allergy shots. That will take a lot of time to build up to an effective dose.  I sent in a month supply of Zyrtec-D.   Malachi Bonds, MD [facility]

## 2021-05-29 NOTE — Addendum Note (Signed)
Addended by: Alfonse Spruce on: 05/29/2021 05:02 PM   Modules accepted: Orders

## 2021-05-29 NOTE — Telephone Encounter (Signed)
Spoke with patient, informed her of Dr. Gallagher's recommendation. Patient verbalized understanding.  °

## 2021-06-16 DIAGNOSIS — I10 Essential (primary) hypertension: Secondary | ICD-10-CM | POA: Diagnosis not present

## 2021-06-16 DIAGNOSIS — E7849 Other hyperlipidemia: Secondary | ICD-10-CM | POA: Diagnosis not present

## 2021-06-16 DIAGNOSIS — R7303 Prediabetes: Secondary | ICD-10-CM | POA: Diagnosis not present

## 2021-06-17 LAB — LIPID PANEL
Chol/HDL Ratio: 3.3 ratio (ref 0.0–4.4)
Cholesterol, Total: 189 mg/dL (ref 100–199)
HDL: 58 mg/dL (ref >39)
LDL Chol Calc (NIH): 108 mg/dL — ABNORMAL HIGH (ref 0–99)
Triglycerides: 129 mg/dL (ref 0–149)
VLDL Cholesterol Cal: 23 mg/dL (ref 5–40)

## 2021-06-17 LAB — HEPATIC FUNCTION PANEL
ALT: 24 IU/L (ref 0–32)
AST: 19 IU/L (ref 0–40)
Albumin: 4.1 g/dL (ref 3.8–4.8)
Alkaline Phosphatase: 63 IU/L (ref 44–121)
Bilirubin Total: 0.2 mg/dL (ref 0.0–1.2)
Bilirubin, Direct: <0.1 mg/dL (ref 0.00–0.40)
Total Protein: 6.8 g/dL (ref 6.0–8.5)

## 2021-06-17 LAB — HEMOGLOBIN A1C
Est. average glucose Bld gHb Est-mCnc: 120 mg/dL
Hgb A1c MFr Bld: 5.8 % — ABNORMAL HIGH (ref 4.8–5.6)

## 2021-06-19 ENCOUNTER — Other Ambulatory Visit: Payer: Self-pay

## 2021-06-19 ENCOUNTER — Ambulatory Visit (INDEPENDENT_AMBULATORY_CARE_PROVIDER_SITE_OTHER): Payer: PPO | Admitting: Family Medicine

## 2021-06-19 VITALS — BP 140/86 | HR 82 | Temp 97.4°F | Wt 171.6 lb

## 2021-06-19 DIAGNOSIS — F321 Major depressive disorder, single episode, moderate: Secondary | ICD-10-CM | POA: Diagnosis not present

## 2021-06-19 DIAGNOSIS — E038 Other specified hypothyroidism: Secondary | ICD-10-CM

## 2021-06-19 DIAGNOSIS — E7849 Other hyperlipidemia: Secondary | ICD-10-CM

## 2021-06-19 DIAGNOSIS — R7303 Prediabetes: Secondary | ICD-10-CM | POA: Diagnosis not present

## 2021-06-19 DIAGNOSIS — I10 Essential (primary) hypertension: Secondary | ICD-10-CM | POA: Diagnosis not present

## 2021-06-19 MED ORDER — ROSUVASTATIN CALCIUM 5 MG PO TABS: ORAL_TABLET | ORAL | 1 refills | Status: DC

## 2021-06-19 MED ORDER — SERTRALINE HCL 100 MG PO TABS: ORAL_TABLET | ORAL | 0 refills | Status: DC

## 2021-06-19 MED ORDER — LISINOPRIL 20 MG PO TABS: ORAL_TABLET | ORAL | 1 refills | Status: DC

## 2021-06-19 NOTE — Progress Notes (Signed)
Patient ID: [name], female    DOB: [birth year], 65 y.o.   MRN: [id number]   Chief Complaint  Patient presents with   Hypertension   Subjective:    HPI  HTN Pt compliant with BP meds.  No SEs Denies chest pain, sob, LE swelling, or blurry vision.  Pt here to go over labs. Pt had labs done on Monday. Pt seen [name] 03/07/21. Pt states she had been feeling tired and Audrey Jones, NP ordered  labs.    A1c - 5.8.   Smoker for 30 yrs.  Has h/o asthma and h/o bronchitis.  See allergy.   Hypothyroidism- Not having any side effects from thyroid meds.  Compliant with meds. No diarrhea, constipation, depression/anxiety, palpitations, excessive hair loss or weight gain/loss. No heat/cold intolerance.  HLD- doing well no new concerns.  Compliant with meds. No chest pain, palpitations, myalgias or joint pains.  Medical History [name] has a past medical history of Anxiety, Arthritis, Depression, GERD (gastroesophageal reflux disease), Hypertension, Hypothyroid, Hypothyroidism, Moderate persistent asthma (01/20/2021), PONV (postoperative nausea and vomiting), Recurrent upper respiratory infection (URI), and Urticaria.   Outpatient Encounter Medications as of 06/19/2021  Medication Sig   albuterol (PROVENTIL) (2.5 MG/3ML) 0.083% nebulizer solution USE 1 VIAL VIA NEBULIZER EVERY 4 HOURS AS NEEDED FOR WHEEZING.   albuterol (VENTOLIN HFA) 108 (90 Base) MCG/ACT inhaler Inhale 1-2 puffs into the lungs every 6 (six) hours as needed for wheezing or shortness of breath.   albuterol (VENTOLIN HFA) 108 (90 Base) MCG/ACT inhaler Inhale 2 puffs into the lungs every 4 (four) hours as needed for wheezing or shortness of breath.   azithromycin (ZITHROMAX Z-PAK) 250 MG tablet 2 today, then one qd x 4 days   budesonide-formoterol (SYMBICORT) 160-4.5 MCG/ACT inhaler Inhale 2 puffs into the lungs 2 (two) times daily.   [EXPIRED] cetirizine-pseudoephedrine (ZYRTEC-D ALLERGY & CONGESTION) 5-120 MG tablet  Take 1 tablet by mouth 2 (two) times daily.   chlorpheniramine-HYDROcodone (TUSSIONEX PENNKINETIC ER) 10-8 MG/5ML SUER Take 5 mLs by mouth every 12 (twelve) hours as needed for cough.   dicyclomine (BENTYL) 10 MG capsule Take 1 capsule (10 mg total) by mouth 3 (three) times daily as needed (abd pain, diarrhea).   EPINEPHrine 0.3 mg/0.3 mL IJ SOAJ injection Inject 0.3 mg into the muscle as needed for anaphylaxis.   fluticasone (FLONASE) 50 MCG/ACT nasal spray INHALE 2 SPRAYS IN EACH NOSTRIL ONCE DAILY.   gabapentin (NEURONTIN) 100 MG capsule Take 100 mg by mouth at bedtime as needed (back pain).    hydrocortisone 2.5 % cream Apply topically.   levothyroxine (SYNTHROID) 150 MCG tablet TAKE 1 AND 1/2 TABLETS BY MOUTH ON SUNDAY; 1 TABLET DAILY EVERY OTHER DAY.   meloxicam (MOBIC) 7.5 MG tablet Take 7.5 mg by mouth daily.   metroNIDAZOLE (METROGEL) 0.75 % gel Apply topically daily.   Naphazoline-Pheniramine (OPCON-A) 0.027-0.315 % SOLN Place 1 drop into both eyes daily.   pantoprazole (PROTONIX) 40 MG tablet Take 1 tablet (40 mg total) by mouth daily before breakfast.   polyethylene glycol powder (GLYCOLAX/MIRALAX) 17 GM/SCOOP powder Take 17 g by mouth daily.   predniSONE (DELTASONE) 20 MG tablet Take 1 tablet (20 mg total) by mouth daily with breakfast.   psyllium (METAMUCIL SMOOTH TEXTURE) 58.6 % powder Take 1 packet by mouth at bedtime.   saline (AYR) GEL Place 1 application into both nostrils daily as needed (dryness).   Sodium Fluoride (PREVIDENT 5000 BOOSTER) 1.1 % PSTE Place 1 application onto teeth at  bedtime.   valACYclovir (VALTREX) 1000 MG tablet TAKE 1 TABLET BY MOUTH THREE TIMES DAILY FOR 7 DAYS. (Patient taking differently: Take 1,000 mg by mouth daily as needed (fever blisters). Take for 5 days at a time)   [DISCONTINUED] lisinopril (ZESTRIL) 20 MG tablet TAKE (1) TABLET BY MOUTH ONCE DAILY.   [DISCONTINUED] rosuvastatin (CRESTOR) 5 MG tablet TAKE 1 TABLET BY MOUTH ON MONDAY, WEDNESDAY  AND FRIDAY.   [DISCONTINUED] sertraline (ZOLOFT) 100 MG tablet Take 2 tablets (200 mg total) by mouth daily.   lisinopril (ZESTRIL) 20 MG tablet TAKE (1) TABLET BY MOUTH ONCE DAILY.   rosuvastatin (CRESTOR) 5 MG tablet TAKE 1 TABLET BY MOUTH ON MONDAY, WEDNESDAY AND FRIDAY.   sertraline (ZOLOFT) 100 MG tablet Take 1.5 tab p.o. daily.   No facility-administered encounter medications on file as of 06/19/2021.     Review of Systems  Constitutional:  Positive for fatigue. Negative for chills and fever.  HENT:  Negative for congestion, rhinorrhea and sore throat.   Respiratory:  Negative for cough, shortness of breath and wheezing.   Cardiovascular:  Negative for chest pain and leg swelling.  Gastrointestinal:  Negative for abdominal pain, diarrhea, nausea and vomiting.  Genitourinary:  Negative for dysuria and frequency.  Musculoskeletal:  Negative for arthralgias and back pain.  Skin:  Negative for rash.  Neurological:  Negative for dizziness, weakness and headaches.    Vitals BP 140/86   Pulse 82   Temp (!) 97.4 F (36.3 C)   Wt 171 lb 9.6 oz (77.8 kg)   SpO2 97%   BMI 31.39 kg/m   Objective:   Physical Exam Vitals and nursing note reviewed.  Constitutional:      General: She is not in acute distress.    Appearance: Normal appearance. She is not ill-appearing.  HENT:     Head: Normocephalic and atraumatic.     Nose: Nose normal.     Mouth/Throat:     Mouth: Mucous membranes are moist.     Pharynx: Oropharynx is clear.  Eyes:     Extraocular Movements: Extraocular movements intact.     Conjunctiva/sclera: Conjunctivae normal.     Pupils: Pupils are equal, round, and reactive to light.  Cardiovascular:     Rate and Rhythm: Normal rate and regular rhythm.     Pulses: Normal pulses.     Heart sounds: Normal heart sounds.  Pulmonary:     Effort: Pulmonary effort is normal.     Breath sounds: Normal breath sounds. No wheezing, rhonchi or rales.  Musculoskeletal:         General: Normal range of motion.     Right lower leg: No edema.     Left lower leg: No edema.  Skin:    General: Skin is warm and dry.     Findings: No lesion or rash.  Neurological:     General: No focal deficit present.     Mental Status: She is alert and oriented to person, place, and time.  Psychiatric:        Mood and Affect: Mood normal.        Behavior: Behavior normal.     Assessment and Plan   1. Hypertension, unspecified type - lisinopril (ZESTRIL) 20 MG tablet; TAKE (1) TABLET BY MOUTH ONCE DAILY.  Dispense: 90 tablet; Refill: 1  2. Current moderate episode of major depressive disorder without prior episode (HCC) - sertraline (ZOLOFT) 100 MG tablet; Take 1.5 tab p.o. daily.  Dispense: 135 tablet; Refill:  0  3. Other specified hypothyroidism  4. Other hyperlipidemia - rosuvastatin (CRESTOR) 5 MG tablet; TAKE 1 TABLET BY MOUTH ON MONDAY, WEDNESDAY AND FRIDAY.  Dispense: 12 tablet; Refill: 1  5. Prediabetes   Htn- slight elevated, cont to watch salt in diet.  Depression- stable. Pt was on 200mg  and now at 150mg  zoloft.   Hld- doing well on crestor taking in 3x per week.  Hypothyroid- stable. Cont meds.  Prediabetes- a1c at 5.8. gave handout on eating low carb diet.  Return in about 6 months (around 12/20/2021) for f/u hypothyroid, hld, htn.   BP Readings from Last 3 Encounters:  06/19/21 140/86  05/13/21 (!) 164/84  03/07/21 122/82

## 2021-07-14 ENCOUNTER — Other Ambulatory Visit: Payer: Self-pay | Admitting: Allergy & Immunology

## 2021-07-15 ENCOUNTER — Telehealth: Payer: Self-pay | Admitting: Family Medicine

## 2021-07-15 DIAGNOSIS — E038 Other specified hypothyroidism: Secondary | ICD-10-CM

## 2021-07-15 NOTE — Telephone Encounter (Signed)
Pt had labs completed on 06/16/21 for A1C, Hepatic and Lipid. Please advise. Thank you

## 2021-07-15 NOTE — Telephone Encounter (Signed)
Patient has appointment on 8/16 she wants lab work to check her thyroid so you can discuss at appointment.

## 2021-07-17 DIAGNOSIS — E038 Other specified hypothyroidism: Secondary | ICD-10-CM | POA: Diagnosis not present

## 2021-07-17 NOTE — Telephone Encounter (Signed)
Why is she wanting thyroid checked?  What symptoms is she having?   Last time was done 3/22 was normal.   Thx.   Dr .Ladona Ridgel

## 2021-07-17 NOTE — Telephone Encounter (Signed)
Blood work ordered in Epic. Patient notified. 

## 2021-07-17 NOTE — Telephone Encounter (Signed)
Same sx as before- fatigue and hair falling out  ans would like it checked one last time before Dr Shaune Pollack- would like to go this afternoon if possible to have results at her office visit next week

## 2021-07-17 NOTE — Telephone Encounter (Signed)
Pls order TSH and T4.   Thx.   Dr. Ladona Ridgel

## 2021-07-18 LAB — TSH: TSH: 0.069 u[IU]/mL — ABNORMAL LOW (ref 0.450–4.500)

## 2021-07-18 LAB — T4, FREE: Free T4: 1.58 ng/dL (ref 0.82–1.77)

## 2021-07-22 ENCOUNTER — Other Ambulatory Visit: Payer: Self-pay

## 2021-07-22 ENCOUNTER — Ambulatory Visit (INDEPENDENT_AMBULATORY_CARE_PROVIDER_SITE_OTHER): Payer: PPO | Admitting: Family Medicine

## 2021-07-22 VITALS — BP 132/82 | HR 86 | Temp 97.2°F | Wt 170.2 lb

## 2021-07-22 DIAGNOSIS — E039 Hypothyroidism, unspecified: Secondary | ICD-10-CM

## 2021-07-22 DIAGNOSIS — Z713 Dietary counseling and surveillance: Secondary | ICD-10-CM

## 2021-07-22 MED ORDER — LEVOTHYROXINE SODIUM 137 MCG PO TABS: 137.0000 ug | ORAL_TABLET | Freq: Every day | ORAL | 1 refills | Status: DC

## 2021-07-22 NOTE — Progress Notes (Signed)
Patient ID: [name], female    DOB: [birth year], 65 y.o.   MRN: [id number]   Chief Complaint  Patient presents with   Hypothyroidism   Subjective:    HPI Pt here to discuss thyroid function/ hypothyroidism and medication.  Pt is supposed to take medication alternating days on Monday Wednesday and Friday- one whole tablet, levothyroxine .  However patient did not realize the directions have changed and was taking the medication daily and on Sundays taking 1.5 tablets.   No feeling any symptoms of high or low thyroid.  Other than usual fatigue and low energy and not able to lose weight. She stated used to be on phentermine and wondered if that would help with her weight.  Medical History Janita has a past medical history of Anxiety, Arthritis, Depression, GERD (gastroesophageal reflux disease), Hypertension, Hypothyroid, Hypothyroidism, Moderate persistent asthma (01/20/2021), PONV (postoperative nausea and vomiting), Recurrent upper respiratory infection (URI), and Urticaria.   Outpatient Encounter Medications as of 07/22/2021  Medication Sig   albuterol (PROVENTIL) (2.5 MG/3ML) 0.083% nebulizer solution USE 1 VIAL VIA NEBULIZER EVERY 4 HOURS AS NEEDED FOR WHEEZING.   albuterol (VENTOLIN HFA) 108 (90 Base) MCG/ACT inhaler Inhale 1-2 puffs into the lungs every 6 (six) hours as needed for wheezing or shortness of breath.   fluticasone (FLONASE) 50 MCG/ACT nasal spray INHALE 2 SPRAYS IN EACH NOSTRIL ONCE DAILY. (Patient taking differently: As needed per patient.)   gabapentin (NEURONTIN) 100 MG capsule Take 100 mg by mouth at bedtime as needed (back pain).    hydrocortisone 2.5 % cream Apply topically.   levothyroxine (SYNTHROID) 137 MCG tablet Take 1 tablet (137 mcg total) by mouth daily before breakfast.   lisinopril (ZESTRIL) 20 MG tablet TAKE (1) TABLET BY MOUTH ONCE DAILY.   meloxicam (MOBIC) 7.5 MG tablet Take 7.5 mg by mouth daily.   metroNIDAZOLE (METROGEL) 0.75 % gel  Apply topically daily.   Naphazoline-Pheniramine (OPCON-A) 0.027-0.315 % SOLN Place 1 drop into both eyes daily.   pantoprazole (PROTONIX) 40 MG tablet Take 1 tablet (40 mg total) by mouth daily before breakfast.   polyethylene glycol powder (GLYCOLAX/MIRALAX) 17 GM/SCOOP powder Take 17 g by mouth daily. (Patient taking differently: Take 17 g by mouth daily. As needed per patient.)   saline (AYR) GEL Place 1 application into both nostrils daily as needed (dryness).   sertraline (ZOLOFT) 100 MG tablet Take 1.5 tab p.o. daily. (Patient taking differently: Take 200 mg by mouth daily.)   Sodium Fluoride (PREVIDENT 5000 BOOSTER) 1.1 % PSTE Place 1 application onto teeth at bedtime.   valACYclovir (VALTREX) 1000 MG tablet TAKE 1 TABLET BY MOUTH THREE TIMES DAILY FOR 7 DAYS. (Patient taking differently: Take 1,000 mg by mouth daily as needed (fever blisters). Take for 5 days at a time)   [DISCONTINUED] albuterol (VENTOLIN HFA) 108 (90 Base) MCG/ACT inhaler Inhale 2 puffs into the lungs every 4 (four) hours as needed for wheezing or shortness of breath.   [DISCONTINUED] azithromycin (ZITHROMAX Z-PAK) 250 MG tablet 2 today, then one qd x 4 days   [DISCONTINUED] budesonide-formoterol (SYMBICORT) 160-4.5 MCG/ACT inhaler Inhale 2 puffs into the lungs 2 (two) times daily.   [DISCONTINUED] chlorpheniramine-HYDROcodone (TUSSIONEX PENNKINETIC ER) 10-8 MG/5ML SUER Take 5 mLs by mouth every 12 (twelve) hours as needed for cough.   [DISCONTINUED] dicyclomine (BENTYL) 10 MG capsule Take 1 capsule (10 mg total) by mouth 3 (three) times daily as needed (abd pain, diarrhea).   [DISCONTINUED] EPINEPHrine 0.3 mg/0.3 mL  IJ SOAJ injection Inject 0.3 mg into the muscle as needed for anaphylaxis.   [DISCONTINUED] levothyroxine (SYNTHROID) 150 MCG tablet TAKE 1 AND 1/2 TABLETS BY MOUTH ON SUNDAY; 1 TABLET DAILY EVERY OTHER DAY.   [DISCONTINUED] predniSONE (DELTASONE) 20 MG tablet Take 1 tablet (20 mg total) by mouth daily with  breakfast.   [DISCONTINUED] psyllium (METAMUCIL SMOOTH TEXTURE) 58.6 % powder Take 1 packet by mouth at bedtime.   [DISCONTINUED] rosuvastatin (CRESTOR) 5 MG tablet TAKE 1 TABLET BY MOUTH ON MONDAY, WEDNESDAY AND FRIDAY.   No facility-administered encounter medications on file as of 07/22/2021.     Review of Systems  Constitutional:  Negative for chills and fever.  HENT:  Negative for congestion, rhinorrhea and sore throat.   Respiratory:  Negative for cough, shortness of breath and wheezing.   Cardiovascular:  Negative for chest pain and leg swelling.  Gastrointestinal:  Negative for abdominal pain, diarrhea, nausea and vomiting.  Genitourinary:  Negative for dysuria and frequency.  Musculoskeletal:  Negative for arthralgias and back pain.  Skin:  Negative for rash.  Neurological:  Negative for dizziness, weakness and headaches.    Vitals BP 132/82   Pulse 86   Temp (!) 97.2 F (36.2 C)   Wt 170 lb 3.2 oz (77.2 kg)   SpO2 96%   BMI 31.13 kg/m   Objective:   Physical Exam Vitals and nursing note reviewed.  Constitutional:      Appearance: Normal appearance.  HENT:     Head: Normocephalic and atraumatic.     Nose: Nose normal.     Mouth/Throat:     Mouth: Mucous membranes are moist.     Pharynx: Oropharynx is clear.  Eyes:     Extraocular Movements: Extraocular movements intact.     Conjunctiva/sclera: Conjunctivae normal.     Pupils: Pupils are equal, round, and reactive to light.  Cardiovascular:     Rate and Rhythm: Normal rate and regular rhythm.     Pulses: Normal pulses.     Heart sounds: Normal heart sounds.  Pulmonary:     Effort: Pulmonary effort is normal.     Breath sounds: Normal breath sounds. No wheezing, rhonchi or rales.  Musculoskeletal:        General: Normal range of motion.     Right lower leg: No edema.     Left lower leg: No edema.  Skin:    General: Skin is warm and dry.     Findings: No lesion or rash.  Neurological:     General: No  focal deficit present.     Mental Status: She is alert and oriented to person, place, and time.  Psychiatric:        Mood and Affect: Mood normal.        Behavior: Behavior normal.     Assessment and Plan   1. Acquired hypothyroidism  2. Weight loss counseling, encounter for   Hypothyroid- being over treated.  Pt was not taking medication every other day, pt was taking the medication daily levothyroxine, instead of the new directions. Pt was taking medication daily, which caused her to over treat the thyroid. TSH- 0.069, T4- 1.58 Today we will decrease dose from 150 mcg qod to 137 daily. To help simplify the medications.  Recheck in 6 wks. Tsh and T4.  Follow up with Eber Jones NP for results.   I reviewed with pt that Phentermine is not a good long term solution for weight loss and if she didn't have  a good amt weight loss and her HR was elevated at start of visit, I didn't recommend her being on a stimulant.  I advised pt to follow up with her new provider to discuss weight loss options. Pt in agreement.   Return in about 6 weeks (around 09/02/2021) for recheck thyroid.Marland Kitchen

## 2021-07-25 ENCOUNTER — Telehealth: Payer: Self-pay | Admitting: Family Medicine

## 2021-07-25 NOTE — Telephone Encounter (Signed)
Patient was seen 8/16 and medication was change from 150 mg to 137 mg she states feeling nervous and fatigue she is taking levothyroxine 137 mg. Please advise

## 2021-07-26 ENCOUNTER — Encounter: Payer: Self-pay | Admitting: Nurse Practitioner

## 2021-07-30 ENCOUNTER — Encounter (INDEPENDENT_AMBULATORY_CARE_PROVIDER_SITE_OTHER): Payer: Self-pay

## 2021-07-31 ENCOUNTER — Ambulatory Visit (INDEPENDENT_AMBULATORY_CARE_PROVIDER_SITE_OTHER): Payer: PPO | Admitting: Gastroenterology

## 2021-07-31 ENCOUNTER — Other Ambulatory Visit: Payer: Self-pay

## 2021-07-31 ENCOUNTER — Encounter (INDEPENDENT_AMBULATORY_CARE_PROVIDER_SITE_OTHER): Payer: Self-pay | Admitting: Gastroenterology

## 2021-07-31 DIAGNOSIS — K581 Irritable bowel syndrome with constipation: Secondary | ICD-10-CM | POA: Diagnosis not present

## 2021-07-31 DIAGNOSIS — K589 Irritable bowel syndrome without diarrhea: Secondary | ICD-10-CM | POA: Insufficient documentation

## 2021-07-31 MED ORDER — LUBIPROSTONE 8 MCG PO CAPS: 8.0000 ug | ORAL_CAPSULE | Freq: Two times a day (BID) | ORAL | 1 refills | Status: DC

## 2021-07-31 NOTE — Patient Instructions (Addendum)
Start Amitiza 8 mcg every 12 hours Stop Miralax for now

## 2021-07-31 NOTE — Progress Notes (Signed)
[name], M.D. Gastroenterology & Hepatology [facility] [address] [place], [place] [address]  Primary Care Physician: Annalee Genta, DO [address] [place] [address]  I will communicate my assessment and recommendations to the referring MD via EMR.  Problems: IBS-C  History of Present Illness: [name] is a 65 y.o. female with past medical history of IBS-C, anxiety, depression, GERD, hypertension, hypothyroidism, asthma, who presents for follow up of IBS-C.  The patient was last seen on 07/31/2020. At that time, the patient was prescribed Bentyl as needed 3 times daily for abdominal pain and was also prescribed pantoprazole 40 mg every day.  Patient report that for multiple years (>30 years ago) she has presented chronic episodes of constipation, possibly once a week moves her bowels. She used to take Miralax 2 capfuls every day, but she "has been too busy" and has been taking it on average 3 days of every week. States the Miralax helps with the bloating and flatulence but not much with the constipation. She reports that she can have some episodes of watery diarrhea if she spaces out her breakfast significantly, specially when she goes out of her house.  She reported she feels anxious when she has to go out because of the possibility of having a diarrhea episodes. Very occasionally has some epigastric pain.  She did not tolerate Metamucil in the past.  Per clinical notes, she developed some scalp itching when taking Linzess in the past and was not able to tolerate it.  She does not remember taking this medication in the past.  A week ago she had black stools for 2 days, but reports this was a self limited episode.  The patient denies having any nausea, vomiting, fever, chills, hematochezia, hematemesis,  jaundice, pruritus or weight loss.  Last EGD: 06/03/2020 - Normal hypopharynx. - Normal esophagus. - Z-line  irregular, 36 cm from the incisors. - Erythematous mucosa in the antrum. Biopsied. - Normal duodenal bulb and second portion of the duodenum. Biopsied Last Colonoscopy: 06/03/2020  -  Diverticulosis in the sigmoid colon. - External hemorrhoids. - No specimens collected.  Past Medical History: Past Medical History:  Diagnosis Date   Anxiety    Arthritis    Depression    GERD (gastroesophageal reflux disease)    Hypertension    Hypothyroid    Hypothyroidism    Moderate persistent asthma 01/20/2021   PONV (postoperative nausea and vomiting)    Recurrent upper respiratory infection (URI)    Urticaria     Past Surgical History: Past Surgical History:  Procedure Laterality Date   BACK SURGERY     BIOPSY  06/03/2020   Procedure: BIOPSY;  Surgeon: Malissa Hippo, MD;  Location: [facility];  Service: Endoscopy;;   CARPAL TUNNEL RELEASE  06/16/2012   Procedure: CARPAL TUNNEL RELEASE;  Surgeon: Dominica Severin, MD;  Location: [facility];  Service: Orthopedics;  Laterality: Right;  limited open carpal tunnel release   CERVICAL FUSION  2018   CHOLECYSTECTOMY     COLONOSCOPY     COLONOSCOPY WITH PROPOFOL N/A 06/03/2020   Procedure: COLONOSCOPY WITH PROPOFOL;  Surgeon: Malissa Hippo, MD;  Location: [facility];  Service: Endoscopy;  Laterality: N/A;  115   ESOPHAGOGASTRODUODENOSCOPY (EGD) WITH PROPOFOL N/A 06/03/2020   Procedure: ESOPHAGOGASTRODUODENOSCOPY (EGD) WITH PROPOFOL;  Surgeon: Malissa Hippo, MD;  Location: [facility];  Service: Endoscopy;  Laterality: N/A;   TONSILLECTOMY  TRIGGER FINGER RELEASE  06/16/2012   Procedure: RELEASE TRIGGER FINGER/A-1 PULLEY;  Surgeon: Dominica Severin, MD;  Location: [facility];  Service: Orthopedics;  Laterality: Right;  right middle a-1 release   TUBAL LIGATION     UPPER GASTROINTESTINAL ENDOSCOPY     VAGINAL HYSTERECTOMY      Family History: Family History  Problem Relation Age of Onset   COPD Mother     Osteoporosis Mother    Immunodeficiency Mother    COPD Father    Emphysema Father    Tuberculosis Father    Lung disease Father    COPD Sister    Healthy Daughter    Healthy Son    Arthritis Other    Lung disease Other    Cancer Other    Asthma Other    Eczema Neg Hx    Atopy Neg Hx    Angioedema Neg Hx    Allergic rhinitis Neg Hx     Social History: Social History   Tobacco Use  Smoking Status Former   Packs/day: 1.50   Years: 27.00   Pack years: 40.50   Types: Cigarettes   Start date: 1973   Quit date: 2000   Years since quitting: 22.6  Smokeless Tobacco Never   Social History   Substance and Sexual Activity  Alcohol Use No   Social History   Substance and Sexual Activity  Drug Use No    Allergies: Allergies  Allergen Reactions   Penicillins Hives    Medications: Current Outpatient Medications  Medication Sig Dispense Refill   albuterol (PROVENTIL) (2.5 MG/3ML) 0.083% nebulizer solution USE 1 VIAL VIA NEBULIZER EVERY 4 HOURS AS NEEDED FOR WHEEZING. 25 mL 2   albuterol (VENTOLIN HFA) 108 (90 Base) MCG/ACT inhaler Inhale 1-2 puffs into the lungs every 6 (six) hours as needed for wheezing or shortness of breath. 18 g 0   fluticasone (FLONASE) 50 MCG/ACT nasal spray INHALE 2 SPRAYS IN EACH NOSTRIL ONCE DAILY. (Patient taking differently: As needed per patient.) 16 g 0   gabapentin (NEURONTIN) 100 MG capsule Take 100 mg by mouth at bedtime as needed (back pain).      hydrocortisone 2.5 % cream Apply topically.     levothyroxine (SYNTHROID) 137 MCG tablet Take 1 tablet (137 mcg total) by mouth daily before breakfast. 90 tablet 1   lisinopril (ZESTRIL) 20 MG tablet TAKE (1) TABLET BY MOUTH ONCE DAILY. 90 tablet 1   meloxicam (MOBIC) 7.5 MG tablet Take 7.5 mg by mouth daily.     metroNIDAZOLE (METROGEL) 0.75 % gel Apply topically daily.     Naphazoline-Pheniramine (OPCON-A) 0.027-0.315 % SOLN Place 1 drop into both eyes daily.     pantoprazole (PROTONIX) 40  MG tablet Take 1 tablet (40 mg total) by mouth daily before breakfast. 90 tablet 3   polyethylene glycol powder (GLYCOLAX/MIRALAX) 17 GM/SCOOP powder Take 17 g by mouth daily. (Patient taking differently: Take 17 g by mouth daily. As needed per patient.) 507 g 5   rosuvastatin (CRESTOR) 5 MG tablet TAKE 1 TABLET BY MOUTH ON MONDAY, WEDNESDAY AND FRIDAY. 12 tablet 1   saline (AYR) GEL Place 1 application into both nostrils daily as needed (dryness).     sertraline (ZOLOFT) 100 MG tablet Take 1.5 tab p.o. daily. (Patient taking differently: Take 200 mg by mouth daily.) 135 tablet 0   Sodium Fluoride (PREVIDENT 5000 BOOSTER) 1.1 % PSTE Place 1 application onto teeth at bedtime. 100 g 3   valACYclovir (VALTREX) 1000  MG tablet TAKE 1 TABLET BY MOUTH THREE TIMES DAILY FOR 7 DAYS. (Patient taking differently: Take 1,000 mg by mouth daily as needed (fever blisters). Take for 5 days at a time) 21 tablet 3   No current facility-administered medications for this visit.    Review of Systems: GENERAL: negative for malaise, night sweats HEENT: No changes in hearing or vision, no nose bleeds or other nasal problems. NECK: Negative for lumps, goiter, pain and significant neck swelling RESPIRATORY: Negative for cough, wheezing CARDIOVASCULAR: Negative for chest pain, leg swelling, palpitations, orthopnea GI: SEE HPI MUSCULOSKELETAL: Negative for joint pain or swelling, back pain, and muscle pain. SKIN: Negative for lesions, rash PSYCH: Negative for sleep disturbance, mood disorder and recent psychosocial stressors. HEMATOLOGY Negative for prolonged bleeding, bruising easily, and swollen nodes. ENDOCRINE: Negative for cold or heat intolerance, polyuria, polydipsia and goiter. NEURO: negative for tremor, gait imbalance, syncope and seizures. The remainder of the review of systems is noncontributory.   Physical Exam: BP (!) 128/58 (BP Location: Left Arm, Patient Position: Sitting, Cuff Size: Large)   Pulse  79   Temp 97.8 F (36.6 C) (Oral)   Ht 5\' 2"  (1.575 m)   Wt 171 lb 3.2 oz (77.7 kg)   BMI 31.31 kg/m  GENERAL: The patient is AO x3, in no acute distress. Obese. HEENT: Head is normocephalic and atraumatic. EOMI are intact. Mouth is well hydrated and without lesions. NECK: Supple. No masses LUNGS: Clear to auscultation. No presence of rhonchi/wheezing/rales. Adequate chest expansion HEART: RRR, normal s1 and s2. ABDOMEN: Soft, nontender, no guarding, no peritoneal signs, and nondistended. BS +. No masses. EXTREMITIES: Without any cyanosis, clubbing, rash, lesions or edema. NEUROLOGIC: AOx3, no focal motor deficit. SKIN: no jaundice, no rashes  Imaging/Labs: as above  I personally reviewed and interpreted the available labs, imaging and endoscopic files.  Impression and Plan: [name] is a 65 y.o. female with past medical history of IBS-C, anxiety, depression, GERD, hypertension, hypothyroidism, asthma, who presents for follow up of IBS-C.  She has presented some mild improvement while taking MiraLAX but is still presenting significant episodes of constipation.  It seems that she has not tolerated Linzess in the past.  We will attempt with another type of medication (Amitiza 8 mcg twice a day) I will hold on MiraLAX for now.  She has not presented any red flag signs or any other symptoms that would warrant further investigations.  I consider given the chronicity of her symptoms is consistent with a spectrum of IBS-C as she has presented in the past.  If not achieving complete symptom improvement we could increase her dosage to 24 mcg twice a day versus adding MiraLAX to her regimen.  - Start Amitiza 8 mcg every 12 hours - Stop Miralax for now  All questions were answered.      77, MD Gastroenterology and Hepatology [facility]

## 2021-08-04 ENCOUNTER — Ambulatory Visit (INDEPENDENT_AMBULATORY_CARE_PROVIDER_SITE_OTHER): Payer: PPO | Admitting: Family Medicine

## 2021-08-04 ENCOUNTER — Other Ambulatory Visit: Payer: Self-pay

## 2021-08-04 ENCOUNTER — Encounter: Payer: Self-pay | Admitting: Family Medicine

## 2021-08-04 DIAGNOSIS — K219 Gastro-esophageal reflux disease without esophagitis: Secondary | ICD-10-CM | POA: Diagnosis not present

## 2021-08-04 DIAGNOSIS — J3089 Other allergic rhinitis: Secondary | ICD-10-CM | POA: Diagnosis not present

## 2021-08-04 DIAGNOSIS — J4541 Moderate persistent asthma with (acute) exacerbation: Secondary | ICD-10-CM | POA: Diagnosis not present

## 2021-08-04 DIAGNOSIS — J302 Other seasonal allergic rhinitis: Secondary | ICD-10-CM | POA: Diagnosis not present

## 2021-08-04 MED ORDER — PREDNISONE 10 MG PO TABS: ORAL_TABLET | ORAL | 0 refills | Status: DC

## 2021-08-04 NOTE — Patient Instructions (Addendum)
Asthma Begin prednisone 10 mg tablets. Take 2 tablets twice a day for 3 days, then take 2 tablets once a day for 1 day, then take 1 tablet on the 5th day, then stop  Begin albuterol via nebulizer once in the morning and once in the evening.  Wait 20 minutes and use your Symbicort inhaler. Begin Symbicort 80-2 puffs twice a day with a spacer for 2 weeks or until cough and wheeze free You may also use albuterol once every 4 hours if needed for shortness of breath, cough, or wheeze Seek emergency care if your symptoms worsen or you begin to experience respiratory distress  Allergic rhinitis Continue allergen avoidance measures directed toward tree pollen, weed pollen, ragweed pollen, and dust mite as listed below Continue Flonase 2 sprays in each nostril once a day as needed for stuffy nose. In the right nostril, point the applicator out toward the right ear. In the left nostril, point the applicator out toward the left ear Consider saline nasal rinses as needed for nasal symptoms. Use this before any medicated nasal sprays for best result  Reflux Continue dietary and lifestyle modifications as listed below  Call the clinic if this treatment plan is not working well for you  Follow up on Wednesday in the clinic or sooner if needed.   Lifestyle Changes for Controlling GERD When you have GERD, stomach acid feels as if it's backing up toward your mouth. Whether or not you take medication to control your GERD, your symptoms can often be improved with lifestyle changes.   Raise Your Head Reflux is more likely to strike when you're lying down flat, because stomach fluid can flow backward more easily. Raising the head of your bed 4-6 inches can help. To do this: Slide blocks or books under the legs at the head of your bed. Or, place a wedge under the mattress. Many foam stores can make a suitable wedge for you. The wedge should run from your waist to the top of your head. Don't just prop your  head on several pillows. This increases pressure on your stomach. It can make GERD worse.  Watch Your Eating Habits Certain foods may increase the acid in your stomach or relax the lower esophageal sphincter, making GERD more likely. It's best to avoid the following: Coffee, tea, and carbonated drinks (with and without caffeine) Fatty, fried, or spicy food Mint, chocolate, onions, and tomatoes Any other foods that seem to irritate your stomach or cause you pain  Relieve the Pressure Eat smaller meals, even if you have to eat more often. Don't lie down right after you eat. Wait a few hours for your stomach to empty. Avoid tight belts and tight-fitting clothes. Lose excess weight.  Tobacco and Alcohol Avoid smoking tobacco and drinking alcohol. They can make GERD symptoms worse.

## 2021-08-04 NOTE — Progress Notes (Signed)
RE: [name] MRN: [id number] DOB: [birth year] Date of Telemedicine Visit: 08/04/2021  Referring provider: Annalee Genta, DO Primary care provider: Annalee Genta, DO  Chief Complaint: Asthma (Cleaned out basement yesterday and nebulizer used and Mucinex, ears hurting, cough is killing her.  )   Telemedicine Follow Up Visit via Telephone: I connected with [name] for a follow up on 08/04/21 by telephone and verified that I am speaking with the correct person using two identifiers.   I discussed the limitations, risks, security and privacy concerns of performing an evaluation and management service by telephone and the availability of in person appointments. I also discussed with the patient that there may be a patient responsible charge related to this service. The patient expressed understanding and agreed to proceed.  Patient is at home accompanied by her husband who provided/contributed to the history.  Provider is at the office.  Visit start time: 437 Visit end time: 67 Insurance consent/check in by: [facility] consent and medical assistant/nurse: Lachelle  History of Present Illness: She is a 65 y.o. female, who is being followed for asthma and allergic rhinitis. Her previous allergy office visit was on 02/19/2021 with  Nehemiah Settle, FNP .  At today's visit, she reports her asthma has been poorly controlled beginning yesterday after cleaning out a dusty basement.  She reports symptoms including shortness of breath with activity and rest, wheeze, and dry cough.  She reports that prior to this incident she had been using albuterol between 0 and 2 times a week depending on the amount of time she spent outside.  She reports that she has a Symbicort 80 inhaler, however, she has not used this inhaler for several months. She reports slight sinus pressure behind her eyes, slight sore throat, headache over her eyes, and a fullness in both of her ears.  She began taking Mucinex  yesterday with partial relief of symptoms.  She denies body aches and sick contacts.  She continues Flonase and nasal saline rinses as needed.  She continues allergen immunotherapy with no large or local reactions. She reports a slight reduction of symptoms of allergic rhinitis while continuing on allergen immunotherapy. Reflux is reported as well controlled with no heartburn or vomiting.  Her current medications are listed in the chart.    Assessment and Plan: [name] is a 65 y.o. female with: Patient Instructions  Asthma Begin prednisone 10 mg tablets. Take 2 tablets twice a day for 3 days, then take 2 tablets once a day for 1 day, then take 1 tablet on the 5th day, then stop  Begin albuterol via nebulizer once in the morning and once in the evening.  Wait 20 minutes and use your Symbicort inhaler. Begin Symbicort 80-2 puffs twice a day with a spacer for 2 weeks or until cough and wheeze free You may also use albuterol once every 4 hours if needed for shortness of breath, cough, or wheeze Seek emergency care if your symptoms worsen or you begin to experience respiratory distress  Allergic rhinitis Continue allergen avoidance measures directed toward tree pollen, weed pollen, ragweed pollen, and dust mite as listed below Continue Flonase 2 sprays in each nostril once a day as needed for stuffy nose. In the right nostril, point the applicator out toward the right ear. In the left nostril, point the applicator out toward the left ear Consider saline nasal rinses as needed for nasal symptoms. Use this before any medicated nasal sprays for best result  Reflux  Continue dietary and lifestyle modifications as lsited below  Call the clinic if this treatment plan is not working well for you  Follow up on Wednesday in the clinic or sooner if needed.  Return in 2 days (on 08/06/2021), or if symptoms worsen or fail to improve.  Meds ordered this encounter  Medications   predniSONE (DELTASONE) 10 MG  tablet    Sig: Begin prednisone 10 mg tablets. Take 2 tablets twice a day for 3 days, then take 2 tablets once a day for 1 day, then take 1 tablet on the 5th day, then stop    Dispense:  15 tablet    Refill:  0    Medication List:  Current Outpatient Medications  Medication Sig Dispense Refill   albuterol (PROVENTIL) (2.5 MG/3ML) 0.083% nebulizer solution USE 1 VIAL VIA NEBULIZER EVERY 4 HOURS AS NEEDED FOR WHEEZING. 25 mL 2   albuterol (VENTOLIN HFA) 108 (90 Base) MCG/ACT inhaler Inhale 1-2 puffs into the lungs every 6 (six) hours as needed for wheezing or shortness of breath. 18 g 0   fluticasone (FLONASE) 50 MCG/ACT nasal spray INHALE 2 SPRAYS IN EACH NOSTRIL ONCE DAILY. (Patient taking differently: As needed per patient.) 16 g 0   gabapentin (NEURONTIN) 100 MG capsule Take 100 mg by mouth at bedtime as needed (back pain).      hydrocortisone 2.5 % cream Apply topically.     levothyroxine (SYNTHROID) 137 MCG tablet Take 1 tablet (137 mcg total) by mouth daily before breakfast. 90 tablet 1   lisinopril (ZESTRIL) 20 MG tablet TAKE (1) TABLET BY MOUTH ONCE DAILY. 90 tablet 1   lubiprostone (AMITIZA) 8 MCG capsule Take 1 capsule (8 mcg total) by mouth 2 (two) times daily with a meal. 180 capsule 1   meloxicam (MOBIC) 7.5 MG tablet Take 7.5 mg by mouth daily.     metroNIDAZOLE (METROGEL) 0.75 % gel Apply topically daily.     Naphazoline-Pheniramine (OPCON-A) 0.027-0.315 % SOLN Place 1 drop into both eyes daily.     pantoprazole (PROTONIX) 40 MG tablet Take 1 tablet (40 mg total) by mouth daily before breakfast. 90 tablet 3   polyethylene glycol powder (GLYCOLAX/MIRALAX) 17 GM/SCOOP powder Take 17 g by mouth daily. (Patient taking differently: Take 17 g by mouth daily. As needed per patient.) 507 g 5   predniSONE (DELTASONE) 10 MG tablet Begin prednisone 10 mg tablets. Take 2 tablets twice a day for 3 days, then take 2 tablets once a day for 1 day, then take 1 tablet on the 5th day, then stop 15  tablet 0   rosuvastatin (CRESTOR) 5 MG tablet TAKE 1 TABLET BY MOUTH ON MONDAY, WEDNESDAY AND FRIDAY. 12 tablet 1   saline (AYR) GEL Place 1 application into both nostrils daily as needed (dryness).     sertraline (ZOLOFT) 100 MG tablet Take 1.5 tab p.o. daily. (Patient taking differently: Take 200 mg by mouth daily.) 135 tablet 0   Sodium Fluoride (PREVIDENT 5000 BOOSTER) 1.1 % PSTE Place 1 application onto teeth at bedtime. 100 g 3   valACYclovir (VALTREX) 1000 MG tablet TAKE 1 TABLET BY MOUTH THREE TIMES DAILY FOR 7 DAYS. (Patient taking differently: Take 1,000 mg by mouth daily as needed (fever blisters). Take for 5 days at a time) 21 tablet 3   No current facility-administered medications for this visit.   Allergies: Allergies  Allergen Reactions   Penicillins Hives   I reviewed her past medical history, social history, family history, and environmental  history and no significant changes have been reported from previous visit on 02/19/2021.   Objective: Physical Exam Not obtained as encounter was done via telephone.   Previous notes and tests were reviewed.  I discussed the assessment and treatment plan with the patient. The patient was provided an opportunity to ask questions and all were answered. The patient agreed with the plan and demonstrated an understanding of the instructions.   The patient was advised to call back or seek an in-person evaluation if the symptoms worsen or if the condition fails to improve as anticipated.  I provided 28 minutes of non-face-to-face time during this encounter.  It was my pleasure to participate in [place] Lefebre's care today. Please feel free to contact me with any questions or concerns.   Sincerely,  Thermon Leyland, FNP

## 2021-08-05 ENCOUNTER — Telehealth: Payer: Self-pay

## 2021-08-05 NOTE — Telephone Encounter (Signed)
Called however was not able to leave a message as patient can not receive messages at this time. It stated I could text her however we will wait and try again later.

## 2021-08-05 NOTE — Telephone Encounter (Signed)
-----   Message from Hetty Blend, FNP sent at 08/04/2021  9:21 PM EDT ----- Can you please call to check on how this patient is breathing? Please let me know if she is still having cough and SOB. If so, I need to change her inhaler. Thank you very much

## 2021-08-05 NOTE — Telephone Encounter (Signed)
Attempted to call patient but was unable to get through or leave voicemail. Will need to call again.

## 2021-08-07 NOTE — Telephone Encounter (Signed)
Attempted to call patient. There was no answer and was unable to leave voicemail.

## 2021-08-08 NOTE — Telephone Encounter (Signed)
I called the patient to see how her breathing is doing as well as if her inhaler has been helping. Her phone is not accepting voice mails at this time.

## 2021-08-13 NOTE — Telephone Encounter (Signed)
Called patient to follow up on inhalers and if her breathing has improved. I was not able to leave a message or reach the patient.

## 2021-08-14 NOTE — Telephone Encounter (Signed)
Cough has gotten better and is not experiencing any shortness of breath. The inhalers from her previous visit has helped a lot so no change is needed. She stated her breathing has improved a lot.

## 2021-08-15 ENCOUNTER — Other Ambulatory Visit: Payer: Self-pay | Admitting: Family Medicine

## 2021-08-15 ENCOUNTER — Other Ambulatory Visit (INDEPENDENT_AMBULATORY_CARE_PROVIDER_SITE_OTHER): Payer: Self-pay | Admitting: Internal Medicine

## 2021-08-15 DIAGNOSIS — E7849 Other hyperlipidemia: Secondary | ICD-10-CM

## 2021-08-15 NOTE — Telephone Encounter (Signed)
Attempted to contact patient; pt not accepting calls at this time

## 2021-08-18 MED ORDER — VALACYCLOVIR HCL 1 G PO TABS: ORAL_TABLET | ORAL | 1 refills | Status: DC

## 2021-08-29 ENCOUNTER — Other Ambulatory Visit: Payer: Self-pay | Admitting: Family Medicine

## 2021-08-29 DIAGNOSIS — E039 Hypothyroidism, unspecified: Secondary | ICD-10-CM | POA: Diagnosis not present

## 2021-08-30 LAB — T4, FREE: Free T4: 1.46 ng/dL (ref 0.82–1.77)

## 2021-08-30 LAB — TSH: TSH: 1.74 u[IU]/mL (ref 0.450–4.500)

## 2021-09-05 ENCOUNTER — Encounter: Payer: Self-pay | Admitting: Nurse Practitioner

## 2021-09-05 ENCOUNTER — Ambulatory Visit (INDEPENDENT_AMBULATORY_CARE_PROVIDER_SITE_OTHER): Payer: PPO | Admitting: Nurse Practitioner

## 2021-09-05 ENCOUNTER — Other Ambulatory Visit: Payer: Self-pay

## 2021-09-05 VITALS — BP 136/68 | Wt 174.8 lb

## 2021-09-05 DIAGNOSIS — E038 Other specified hypothyroidism: Secondary | ICD-10-CM | POA: Diagnosis not present

## 2021-09-05 DIAGNOSIS — F419 Anxiety disorder, unspecified: Secondary | ICD-10-CM | POA: Diagnosis not present

## 2021-09-05 DIAGNOSIS — F410 Panic disorder [episodic paroxysmal anxiety] without agoraphobia: Secondary | ICD-10-CM | POA: Insufficient documentation

## 2021-09-05 DIAGNOSIS — R55 Syncope and collapse: Secondary | ICD-10-CM | POA: Diagnosis not present

## 2021-09-05 MED ORDER — CLONAZEPAM 0.5 MG PO TABS: ORAL_TABLET | ORAL | 0 refills | Status: DC

## 2021-09-05 NOTE — Progress Notes (Signed)
   Subjective:    Patient ID: [name], female    DOB: 1955-12-29, 65 y.o.   MRN: [id number]  HPI  Patient arrives for a follow up on thyroid. Discuss recent labs. Denies any palpitations.  No chest pain, shortness of breath.  No edema.  No numbness or weakness of the face arms or legs.  No difficulty speaking.  Does water aerobics on a regular basis without any problems.  Patient mentions about twice a week she will have symptoms that start with flushing then feeling hot and sweaty then dizziness and nausea, no vomiting.  No syncopal episodes.  Episodes usually occur between 10 and 11 in the mornings.  States she will sit down because she feels hot.  Usually takes about 30 minutes for the entire episode to resolve.    Can occur at home or in public.  States she does not usually eat breakfast, unsure about her blood sugar around that time.  No change in her medications.  Sleeping well.  Admits to some increased anxiety over time.  BP at home running 135-140/70-80. [facility] her mother had severe osteoporosis with many fractures.       Objective:   Physical Exam NAD.  Alert, oriented.  Making good eye contact.  Dressed appropriately.  Speech clear.  Moderately anxious affect.  Thoughts logical coherent and relevant.  Thyroid nontender to palpation, no mass or goiter noted.  Lungs clear.  Heart regular rate and rhythm.  Gait normal.  Gets on and off table without assistance.  Today's Vitals   09/05/21 1332  BP: 136/68  Weight: 174 lb 12.8 oz (79.3 kg)   Body mass index is 31.97 kg/m. Results for orders placed or performed in visit on 08/29/21  T4, free  Result Value Ref Range   Free T4 1.46 0.82 - 1.77 ng/dL  TSH  Result Value Ref Range   TSH 1.740 0.450 - 4.500 uIU/mL          Assessment & Plan:   Problem List Items Addressed This Visit       Endocrine   Hypothyroidism - Primary     Other   Anxiety   Panic attack   Vasovagal episode   Meds ordered this encounter   Medications   clonazePAM (KLONOPIN) 0.5 MG tablet    Sig: Take 1/2-1 tab po qd prn panic attacks    Dispense:  20 tablet    Refill:  0    Order Specific Question:   Supervising Provider    Answer:   Lilyan Punt A [[id number]]   Continue current dose of levothyroxine.  Reviewed labs with patient. Because the episodes occur around the same time of day, recommend that the patient check her blood sugar and blood pressure when an episode occurs. Trial of low-dose Klonopin during the episodes.  Warning signs reviewed.  Patient to seek help immediately if any new or worsening symptoms.  Discussed the importance of stress reduction.  Defers daily medication at this time. Return if symptoms worsen or fail to improve.

## 2021-09-10 ENCOUNTER — Ambulatory Visit
Admission: EM | Admit: 2021-09-10 | Discharge: 2021-09-10 | Disposition: A | Discharge status: Home / Self Care | Payer: PPO | Attending: Family Medicine | Admitting: Family Medicine

## 2021-09-10 ENCOUNTER — Other Ambulatory Visit: Payer: Self-pay

## 2021-09-10 ENCOUNTER — Encounter: Payer: Self-pay | Admitting: Emergency Medicine

## 2021-09-10 DIAGNOSIS — M25511 Pain in right shoulder: Secondary | ICD-10-CM | POA: Diagnosis not present

## 2021-09-10 MED ORDER — DEXAMETHASONE SODIUM PHOSPHATE 10 MG/ML IJ SOLN
10.0000 mg | Freq: Once | INTRAMUSCULAR | Status: AC
  Administered 2021-09-10: 10 mg via INTRAMUSCULAR

## 2021-09-10 NOTE — ED Provider Notes (Signed)
[facility]   [id number] 09/10/21 Arrival Time: 1656  ASSESSMENT & PLAN:  1. Acute pain of right shoulder    No indication for plain imaging of shoulder at this time. Question bursitis vs tendonitis; discussed.  Meds ordered this encounter  Medications   dexamethasone (DECADRON) injection 10 mg   Recommend:  Follow-up Information     [facility].   Specialty: Specialist Why: If worsening or failing to improve as anticipated. Contact information: [address] [place] [address] [phone]                ROM encouraged.  Reviewed expectations re: course of current medical issues. Questions answered. Outlined signs and symptoms indicating need for more acute intervention. Patient verbalized understanding. After Visit Summary given.  SUBJECTIVE: History from: patient. [name] is a 65 y.o. female who reports R anterior shoulder pain; grad onset; x 3 days; no injury/trauma. H/O similar in the past; resolved with "a shot". No extremity sensation changes or weakness. Voltaren gel with some relief.  Past Surgical History:  Procedure Laterality Date   BACK SURGERY     BIOPSY  06/03/2020   Procedure: BIOPSY;  Surgeon: Malissa Hippo, MD;  [facility];  Service: Endoscopy;;   CARPAL TUNNEL RELEASE  06/16/2012   Procedure: CARPAL TUNNEL RELEASE;  Surgeon: Dominica Severin, MD;  Location: [facility];  Service: Orthopedics;  Laterality: Right;  limited open carpal tunnel release   CERVICAL FUSION  2018   CHOLECYSTECTOMY     COLONOSCOPY     COLONOSCOPY WITH PROPOFOL N/A 06/03/2020   Procedure: COLONOSCOPY WITH PROPOFOL;  Surgeon: Malissa Hippo, MD;  [facility]: [facility];  Service: Endoscopy;  Laterality: N/A;  115   ESOPHAGOGASTRODUODENOSCOPY (EGD) WITH PROPOFOL N/A 06/03/2020   Procedure: ESOPHAGOGASTRODUODENOSCOPY (EGD) WITH PROPOFOL;  Surgeon: Malissa Hippo, MD;  [facility];  Service:  Endoscopy;  Laterality: N/A;   TONSILLECTOMY     TRIGGER FINGER RELEASE  06/16/2012   Procedure: RELEASE TRIGGER FINGER/A-1 PULLEY;  Surgeon: Dominica Severin, MD;  Location:  SURGERY CENTER;  Service: Orthopedics;  Laterality: Right;  right middle a-1 release   TUBAL LIGATION     UPPER GASTROINTESTINAL ENDOSCOPY     VAGINAL HYSTERECTOMY        OBJECTIVE:  Vitals:   09/10/21 1714  BP: 123/68  Pulse: 82  Resp: 18  Temp: (!) 97.4 F (36.3 C)  TempSrc: Oral  SpO2: 95%    General appearance: alert; no distress HEENT: [place]; AT Neck: supple with FROM Resp: unlabored respirations Extremities: RUE: warm with well perfused appearance; fairly well localized moderate tenderness over right anterior shoulder; without gross deformities; swelling: none; bruising: none; shoulder ROM: limited by reported pain CV: brisk extremity capillary refill of RUE; 2+ radial pulse of RUE. Skin: warm and dry; no visible rashes Neurologic: gait normal; normal sensation and strength of RUE Psychological: alert and cooperative; normal mood and affect   Allergies  Allergen Reactions   Penicillins Hives    Past Medical History:  Diagnosis Date   Anxiety    Arthritis    Depression    GERD (gastroesophageal reflux disease)    Hypertension    Hypothyroid    Hypothyroidism    Moderate persistent asthma 01/20/2021   PONV (postoperative nausea and vomiting)    Recurrent upper respiratory infection (URI)    Urticaria    Social History   Socioeconomic History   Marital status: Married  Spouse name: Not on file   Number of children: Not on file   Years of education: 12   Highest education level: Not on file  Occupational History   Not on file  Tobacco Use   Smoking status: Former    Packs/day: 1.50    Years: 27.00    Pack years: 40.50    Types: Cigarettes    Start date: 17    Quit date: 2000    Years since quitting: 22.7   Smokeless tobacco: Never  Vaping Use   Vaping Use: Never  used  Substance and Sexual Activity   Alcohol use: No   Drug use: No   Sexual activity: Not on file  Other Topics Concern   Not on file  Social History Narrative   Not on file   Social Determinants of Health   Financial Resource Strain: Not on file  Food Insecurity: Not on file  Transportation Needs: Not on file  Physical Activity: Not on file  Stress: Not on file  Social Connections: Not on file   Family History  Problem Relation Age of Onset   COPD Mother    Osteoporosis Mother    Immunodeficiency Mother    COPD Father    Emphysema Father    Tuberculosis Father    Lung disease Father    COPD Sister    Healthy Daughter    Healthy Son    Arthritis Other    Lung disease Other    Cancer Other    Asthma Other    Eczema Neg Hx    Atopy Neg Hx    Angioedema Neg Hx    Allergic rhinitis Neg Hx    Past Surgical History:  Procedure Laterality Date   BACK SURGERY     BIOPSY  06/03/2020   Procedure: BIOPSY;  Surgeon: Malissa Hippo, MD;  [facility];  Service: Endoscopy;;   CARPAL TUNNEL RELEASE  06/16/2012   Procedure: CARPAL TUNNEL RELEASE;  Surgeon: Dominica Severin, MD;  Location: [facility];  Service: Orthopedics;  Laterality: Right;  limited open carpal tunnel release   CERVICAL FUSION  2018   CHOLECYSTECTOMY     COLONOSCOPY     COLONOSCOPY WITH PROPOFOL N/A 06/03/2020   Procedure: COLONOSCOPY WITH PROPOFOL;  Surgeon: Malissa Hippo, MD;  [facility];  Service: Endoscopy;  Laterality: N/A;  115   ESOPHAGOGASTRODUODENOSCOPY (EGD) WITH PROPOFOL N/A 06/03/2020   Procedure: ESOPHAGOGASTRODUODENOSCOPY (EGD) WITH PROPOFOL;  Surgeon: Malissa Hippo, MD;  [facility];  Service: Endoscopy;  Laterality: N/A;   TONSILLECTOMY     TRIGGER FINGER RELEASE  06/16/2012   Procedure: RELEASE TRIGGER FINGER/A-1 PULLEY;  Surgeon: Dominica Severin, MD;  Location: [facility];  Service: Orthopedics;  Laterality: Right;  right  middle a-1 release   TUBAL LIGATION     UPPER GASTROINTESTINAL ENDOSCOPY     VAGINAL HYSTERECTOMY         Mardella Layman, MD 09/10/21 1759

## 2021-09-10 NOTE — Discharge Instructions (Addendum)
Meds ordered this encounter  Medications   dexamethasone (DECADRON) injection 10 mg    

## 2021-09-10 NOTE — ED Triage Notes (Signed)
Pt here for right shoulder pain with radiation down to right hand x 3 days; denies injury

## 2021-09-11 ENCOUNTER — Other Ambulatory Visit: Payer: Self-pay | Admitting: Nurse Practitioner

## 2021-09-11 DIAGNOSIS — M810 Age-related osteoporosis without current pathological fracture: Secondary | ICD-10-CM

## 2021-09-11 DIAGNOSIS — Z780 Asymptomatic menopausal state: Secondary | ICD-10-CM

## 2021-09-15 ENCOUNTER — Other Ambulatory Visit: Payer: Self-pay | Admitting: Nurse Practitioner

## 2021-09-15 DIAGNOSIS — E7849 Other hyperlipidemia: Secondary | ICD-10-CM

## 2021-09-16 ENCOUNTER — Telehealth: Payer: Self-pay | Admitting: Family Medicine

## 2021-09-17 ENCOUNTER — Other Ambulatory Visit: Payer: Self-pay

## 2021-09-17 ENCOUNTER — Ambulatory Visit (HOSPITAL_COMMUNITY)
Admission: RE | Admit: 2021-09-17 | Discharge: 2021-09-17 | Disposition: A | Discharge status: Home / Self Care | Payer: PPO | Source: Ambulatory Visit | Attending: Nurse Practitioner | Admitting: Nurse Practitioner

## 2021-09-17 DIAGNOSIS — Z780 Asymptomatic menopausal state: Secondary | ICD-10-CM | POA: Diagnosis not present

## 2021-09-17 DIAGNOSIS — M810 Age-related osteoporosis without current pathological fracture: Secondary | ICD-10-CM | POA: Diagnosis not present

## 2021-09-18 ENCOUNTER — Ambulatory Visit (HOSPITAL_COMMUNITY)
Admission: RE | Admit: 2021-09-18 | Discharge: 2021-09-18 | Disposition: A | Discharge status: Home / Self Care | Payer: PPO | Source: Ambulatory Visit | Attending: Family Medicine | Admitting: Family Medicine

## 2021-09-18 ENCOUNTER — Ambulatory Visit (INDEPENDENT_AMBULATORY_CARE_PROVIDER_SITE_OTHER): Payer: PPO | Admitting: Family Medicine

## 2021-09-18 ENCOUNTER — Other Ambulatory Visit: Payer: Self-pay | Admitting: Family Medicine

## 2021-09-18 VITALS — BP 138/75 | HR 72 | Ht 62.0 in | Wt 174.4 lb

## 2021-09-18 DIAGNOSIS — M25511 Pain in right shoulder: Secondary | ICD-10-CM

## 2021-09-18 DIAGNOSIS — Z981 Arthrodesis status: Secondary | ICD-10-CM | POA: Diagnosis not present

## 2021-09-18 DIAGNOSIS — Z0000 Encounter for general adult medical examination without abnormal findings: Secondary | ICD-10-CM

## 2021-09-18 DIAGNOSIS — M4322 Fusion of spine, cervical region: Secondary | ICD-10-CM | POA: Diagnosis not present

## 2021-09-18 DIAGNOSIS — I10 Essential (primary) hypertension: Secondary | ICD-10-CM

## 2021-09-18 DIAGNOSIS — M50323 Other cervical disc degeneration at C6-C7 level: Secondary | ICD-10-CM | POA: Diagnosis not present

## 2021-09-18 DIAGNOSIS — E7849 Other hyperlipidemia: Secondary | ICD-10-CM | POA: Diagnosis not present

## 2021-09-18 MED ORDER — ROSUVASTATIN CALCIUM 5 MG PO TABS: 5.0000 mg | ORAL_TABLET | Freq: Every day | ORAL | 1 refills | Status: DC

## 2021-09-18 NOTE — Assessment & Plan Note (Signed)
Reviewed results of bone density test.  Bone density was normal.  Patient was pleased regarding this.

## 2021-09-18 NOTE — Telephone Encounter (Signed)
Error Please close

## 2021-09-18 NOTE — Assessment & Plan Note (Signed)
Stable on ACEi. Continue lisinopril.

## 2021-09-18 NOTE — Assessment & Plan Note (Signed)
10 year ASCVD risk score 7.3%. Changing Crestor to 5 mg daily.  New Rx sent to the pharmacy.

## 2021-09-18 NOTE — Patient Instructions (Signed)
Bone density looks good.  Start the meloxicam. I will call with the xray results.  I have refilled the crestor and made it daily.  Follow up in 6 months.

## 2021-09-18 NOTE — Assessment & Plan Note (Signed)
Suspect rotator cuff tendinitis. X-rays were obtained today.  X-ray of the right shoulder was normal.  X-ray of the cervical spine revealed no acute findings but some degenerative changes/facet disease.  This could be playing a role.  Trial of meloxicam.  If fails to improve, may need MRI imaging.  Patient also has an upcoming appoint with orthopedics.

## 2021-09-18 NOTE — Progress Notes (Signed)
Subjective:  Patient ID: [name], female    DOB: Jan 04, 1956  Age: 65 y.o. MRN: [id number]  CC: Chief Complaint  Patient presents with   Hypertension    Follow up- establish care Patient reports pain in right shoulder and runs down arm-sees orthopedic doctor 10/16/21 Needs refill Crestor- would like to increase number     HPI:  65 year old female presents for follow-up.  Issues and concerns:  Right shoulder pain Ongoing right shoulder pain.  She states this started 2 weeks ago. She was seen at urgent care.  This was thought to be rotator cuff tendinitis or bursitis.  She was given a Decadron injection. Patient states that she had initial improvement with the steroid injection but her pain has since recurred. Patient localizes the pain to the lateral aspect of the shoulder.  She reports decreased range of motion and difficulty doing her normal activities.  She reports numbness and tingling which runs down the arm into the hand.  She has a history of cervical spine surgery.  She is concerned that this may be playing a role. Patient was recently prescribed meloxicam and has not taken it yet.  Discuss Bone Density Patient had a recent bone density test.  She would like to discuss the results today.  Hyperlipidemia Patient is currently taking Crestor 5 mg 3 times a week.  She states that she has never taken this daily.  Last LDL was 108. Patient states that she is in need of a refill and would like to discuss changing the prescription. She is tolerating it well without difficulty.  Hypertension Has been stable. Currently on Lisinopril. No side effects.  Patient Active Problem List   Diagnosis Date Noted   Right shoulder pain 09/18/2021   Healthcare maintenance 09/18/2021   Panic attack 09/05/2021   IBS (irritable bowel syndrome) 07/31/2021   Moderate persistent asthma with exacerbation 01/20/2021   Prediabetes 12/13/2017   Hyperlipidemia 12/04/2016   Rosacea  03/07/2014   Chronic low back pain 10/22/2013   Anxiety 08/23/2013   Depression 05/15/2013   Urinary incontinence in female 02/23/2013   GERD (gastroesophageal reflux disease) 09/19/2012   Intestinal bacterial overgrowth 07/25/2012   Essential hypertension 07/25/2012   Hypothyroidism 07/25/2012   CTS (carpal tunnel syndrome) 04/21/2012   Trigger finger 04/21/2012    Social Hx   Social History   Socioeconomic History   Marital status: Married    Spouse name: Not on file   Number of children: Not on file   Years of education: 12   Highest education level: Not on file  Occupational History   Not on file  Tobacco Use   Smoking status: Former    Packs/day: 1.50    Years: 27.00    Pack years: 40.50    Types: Cigarettes    Start date: 1973    Quit date: 2000    Years since quitting: 22.7   Smokeless tobacco: Never  Vaping Use   Vaping Use: Never used  Substance and Sexual Activity   Alcohol use: No   Drug use: No   Sexual activity: Not on file  Other Topics Concern   Not on file  Social History Narrative   Not on file   Social Determinants of Health   Financial Resource Strain: Not on file  Food Insecurity: Not on file  Transportation Needs: Not on file  Physical Activity: Not on file  Stress: Not on file  Social Connections: Not on file    Review of  Systems  Constitutional: Negative.   Musculoskeletal:        R shoulder pain.  Neurological:  Positive for numbness.    Objective:  BP 138/75   Pulse 72   Ht 5\' 2"  (1.575 m)   Wt 174 lb 6.4 oz (79.1 kg)   SpO2 99%   BMI 31.90 kg/m   BP/Weight 09/18/2021 09/10/2021 09/05/2021  Systolic BP 138 123 136  Diastolic BP 75 68 68  Wt. (Lbs) 174.4 - 174.8  BMI 31.9 - 31.97    Physical Exam Vitals and nursing note reviewed.  Constitutional:      General: She is not in acute distress.    Appearance: Normal appearance. She is not ill-appearing.  HENT:     Head: Normocephalic and atraumatic.  Cardiovascular:      Rate and Rhythm: Normal rate and regular rhythm.  Pulmonary:     Effort: Pulmonary effort is normal.     Breath sounds: Normal breath sounds. No wheezing, rhonchi or rales.  Musculoskeletal:     Comments: R shoulder: Inspection reveals no abnormalities, atrophy or asymmetry. Palpation is normal with no tenderness over AC joint or bicipital groove. ROM decreased in flexion. Rotator cuff strength - 4/5 supraspinatus, infraspinatus/teres minor. + Hawkins.   Neurological:     Mental Status: She is alert.    Lab Results  Component Value Date   WBC 5.7 02/17/2021   HGB 14.6 02/17/2021   HCT 43.3 02/17/2021   PLT 277 02/17/2021   GLUCOSE 97 02/17/2021   CHOL 189 06/16/2021   TRIG 129 06/16/2021   HDL 58 06/16/2021   LDLCALC 108 (H) 06/16/2021   ALT 24 06/16/2021   AST 19 06/16/2021   NA 141 02/17/2021   K 4.6 02/17/2021   CL 104 02/17/2021   CREATININE 0.88 02/17/2021   BUN 14 02/17/2021   CO2 23 02/17/2021   TSH 1.740 08/29/2021   INR 0.9 10/21/2007   HGBA1C 5.8 (H) 06/16/2021     Assessment & Plan:   Problem List Items Addressed This Visit       Cardiovascular and Mediastinum   Essential hypertension    Stable on ACEi. Continue lisinopril.       Relevant Medications   rosuvastatin (CRESTOR) 5 MG tablet     Other   Healthcare maintenance    Reviewed results of bone density test.  Bone density was normal.  Patient was pleased regarding this.      Hyperlipidemia    10 year ASCVD risk score 7.3%. Changing Crestor to 5 mg daily.  New Rx sent to the pharmacy.      Relevant Medications   rosuvastatin (CRESTOR) 5 MG tablet   Right shoulder pain - Primary    Suspect rotator cuff tendinitis. X-rays were obtained today.  X-ray of the right shoulder was normal.  X-ray of the cervical spine revealed no acute findings but some degenerative changes/facet disease.  This could be playing a role.  Trial of meloxicam.  If fails to improve, may need MRI imaging.   Patient also has an upcoming appoint with orthopedics.      Relevant Orders   DG Cervical Spine Complete (Completed)   DG Shoulder Right (Completed)    Meds ordered this encounter  Medications   rosuvastatin (CRESTOR) 5 MG tablet    Sig: Take 1 tablet (5 mg total) by mouth daily.    Dispense:  90 tablet    Refill:  1    Follow-up:  Return in about  6 months (around 03/19/2022) for Follow up Chronic medical issues.  Everlene Other DO [facility]

## 2021-10-03 ENCOUNTER — Ambulatory Visit: Payer: PPO | Admitting: Family Medicine

## 2021-10-06 ENCOUNTER — Ambulatory Visit: Payer: PPO | Admitting: Family Medicine

## 2021-10-14 ENCOUNTER — Telehealth: Payer: Self-pay | Admitting: Family Medicine

## 2021-10-14 NOTE — Telephone Encounter (Signed)
Patient has appointment with orthopedic, (Dr. Aundria Rud) surgeon and was concerned about getting her x-rays to him. She was not sure if he was in the [place] system. Just confirming.   CB# [phone]

## 2021-10-16 DIAGNOSIS — M7541 Impingement syndrome of right shoulder: Secondary | ICD-10-CM | POA: Diagnosis not present

## 2021-10-16 NOTE — Telephone Encounter (Signed)
Patient notified and stated the ortho was able to pull up her images at her visit.

## 2021-10-16 NOTE — Telephone Encounter (Signed)
[place] [facility]     Dr. Aundria Rud is with [facility] - The Office will be able to see the Imaging through a third party radiology Portal.

## 2021-11-06 ENCOUNTER — Other Ambulatory Visit: Payer: PPO

## 2021-11-27 DIAGNOSIS — M25511 Pain in right shoulder: Secondary | ICD-10-CM | POA: Diagnosis not present

## 2021-11-27 DIAGNOSIS — M7541 Impingement syndrome of right shoulder: Secondary | ICD-10-CM | POA: Diagnosis not present

## 2021-11-27 DIAGNOSIS — M542 Cervicalgia: Secondary | ICD-10-CM | POA: Diagnosis not present

## 2021-12-01 ENCOUNTER — Ambulatory Visit
Admission: EM | Admit: 2021-12-01 | Discharge: 2021-12-01 | Disposition: A | Discharge status: Home / Self Care | Payer: PPO | Attending: Family Medicine | Admitting: Family Medicine

## 2021-12-01 ENCOUNTER — Other Ambulatory Visit: Payer: Self-pay

## 2021-12-01 DIAGNOSIS — R509 Fever, unspecified: Secondary | ICD-10-CM

## 2021-12-01 DIAGNOSIS — J069 Acute upper respiratory infection, unspecified: Secondary | ICD-10-CM

## 2021-12-01 DIAGNOSIS — J4521 Mild intermittent asthma with (acute) exacerbation: Secondary | ICD-10-CM | POA: Diagnosis not present

## 2021-12-01 MED ORDER — PROMETHAZINE-DM 6.25-15 MG/5ML PO SYRP: 5.0000 mL | ORAL_SOLUTION | Freq: Four times a day (QID) | ORAL | 0 refills | Status: DC | PRN

## 2021-12-01 MED ORDER — ONDANSETRON 4 MG PO TBDP
4.0000 mg | ORAL_TABLET | Freq: Once | ORAL | Status: AC
  Administered 2021-12-01: 16:00:00 4 mg via ORAL

## 2021-12-01 MED ORDER — PREDNISONE 20 MG PO TABS: 40.0000 mg | ORAL_TABLET | Freq: Every day | ORAL | 0 refills | Status: DC

## 2021-12-01 NOTE — ED Triage Notes (Signed)
Pt presents with c/o cough that began 2 days, has h/o asthma and concerned for bronchitis

## 2021-12-01 NOTE — ED Provider Notes (Signed)
[facility]    CSN: [id number] Arrival date & time: 12/01/21  1135      History   Chief Complaint Chief Complaint  Patient presents with   Cough    HPI [name] is a 65 y.o. female.   Presenting today with 2-day history of cough, wheezing, fever, chills, fatigue.  Denies chest pain, shortness of breath, abdominal pain, vomiting, diarrhea but is starting to have some nausea since this morning.  States she has a history of asthma and gets bronchitis every year, typically takes Allegra-D but has not started it up this winter yet.  Does have a history of asthma, on albuterol breathing treatments and inhaler as needed.  Last breathing treatment was this morning.  Multiple sick contacts recently.  Past Medical History:  Diagnosis Date   Anxiety    Arthritis    Depression    GERD (gastroesophageal reflux disease)    Hypertension    Hypothyroid    Hypothyroidism    Moderate persistent asthma 01/20/2021   PONV (postoperative nausea and vomiting)    Recurrent upper respiratory infection (URI)    Urticaria    Patient Active Problem List   Diagnosis Date Noted   Right shoulder pain 09/18/2021   Healthcare maintenance 09/18/2021   Panic attack 09/05/2021   IBS (irritable bowel syndrome) 07/31/2021   Moderate persistent asthma with exacerbation 01/20/2021   Prediabetes 12/13/2017   Hyperlipidemia 12/04/2016   Rosacea 03/07/2014   Chronic low back pain 10/22/2013   Anxiety 08/23/2013   Depression 05/15/2013   Urinary incontinence in female 02/23/2013   GERD (gastroesophageal reflux disease) 09/19/2012   Intestinal bacterial overgrowth 07/25/2012   Essential hypertension 07/25/2012   Hypothyroidism 07/25/2012   CTS (carpal tunnel syndrome) 04/21/2012   Trigger finger 04/21/2012   Past Surgical History:  Procedure Laterality Date   BACK SURGERY     BIOPSY  06/03/2020   Procedure: BIOPSY;  Surgeon: Rogene Houston, MD;  [facility];  Service:  Endoscopy;;   CARPAL TUNNEL RELEASE  06/16/2012   Procedure: CARPAL TUNNEL RELEASE;  Surgeon: Roseanne Kaufman, MD;  Location: [place];  Service: Orthopedics;  Laterality: Right;  limited open carpal tunnel release   CERVICAL FUSION  2018   CHOLECYSTECTOMY     COLONOSCOPY     COLONOSCOPY WITH PROPOFOL N/A 06/03/2020   Procedure: COLONOSCOPY WITH PROPOFOL;  Surgeon: Rogene Houston, MD;  Location: [facility];  Service: Endoscopy;  Laterality: N/A;  115   ESOPHAGOGASTRODUODENOSCOPY (EGD) WITH PROPOFOL N/A 06/03/2020   Procedure: ESOPHAGOGASTRODUODENOSCOPY (EGD) WITH PROPOFOL;  Surgeon: Rogene Houston, MD;  Location: [facility];  Service: Endoscopy;  Laterality: N/A;   TONSILLECTOMY     TRIGGER FINGER RELEASE  06/16/2012   Procedure: RELEASE TRIGGER FINGER/A-1 PULLEY;  Surgeon: Roseanne Kaufman, MD;  Location: [place];  Service: Orthopedics;  Laterality: Right;  right middle a-1 release   TUBAL LIGATION     UPPER GASTROINTESTINAL ENDOSCOPY     VAGINAL HYSTERECTOMY      OB History   No obstetric history on file.      Home Medications    Prior to Admission medications   Medication Sig Start Date End Date Taking? Authorizing Provider  predniSONE (DELTASONE) 20 MG tablet Take 2 tablets (40 mg total) by mouth daily with breakfast. 12/01/21  Yes Volney American, PA-C  promethazine-dextromethorphan (PROMETHAZINE-DM) 6.25-15 MG/5ML syrup Take 5 mLs by mouth 4 (four) times daily as needed. 12/01/21  Yes Volney American, PA-C  albuterol (PROVENTIL) (2.5 MG/3ML) 0.083% nebulizer solution USE 1 VIAL VIA NEBULIZER EVERY 4 HOURS AS NEEDED FOR WHEEZING. 05/22/21   Lovena Le, Malena M, DO  albuterol (VENTOLIN HFA) 108 (90 Base) MCG/ACT inhaler Inhale 1-2 puffs into the lungs every 6 (six) hours as needed for wheezing or shortness of breath. 03/11/20   Avegno, Darrelyn Hillock, FNP  clonazePAM (KLONOPIN) 0.5 MG tablet Take 1/2-1 tab po qd prn panic attacks 09/05/21    Nilda Simmer, NP  fluticasone (FLONASE) 50 MCG/ACT nasal spray INHALE 2 SPRAYS IN EACH NOSTRIL ONCE DAILY. Patient taking differently: As needed per patient. 04/24/21   Althea Charon, FNP  gabapentin (NEURONTIN) 100 MG capsule Take 100 mg by mouth at bedtime as needed (back pain).     [provider]  hydrocortisone 2.5 % cream Apply topically. 12/12/20   [provider]  levothyroxine (SYNTHROID) 137 MCG tablet TAKE (1) TABLET BY MOUTH ONCE DAILY BEFORE BREAKFAST. 09/18/21   Cook, Barnie Del, DO  lisinopril (ZESTRIL) 20 MG tablet TAKE (1) TABLET BY MOUTH ONCE DAILY. 06/19/21   Erven Colla, DO  lubiprostone (AMITIZA) 8 MCG capsule Take 1 capsule (8 mcg total) by mouth 2 (two) times daily with a meal. Patient not taking: Reported on 09/18/2021 07/31/21   Montez Morita, Quillian Quince, MD  meloxicam (MOBIC) 7.5 MG tablet Take 7.5 mg by mouth daily.    [provider]  metroNIDAZOLE (METROGEL) 0.75 % gel Apply topically daily. 01/08/21   [provider]  Naphazoline-Pheniramine (OPCON-A) 0.027-0.315 % SOLN Place 1 drop into both eyes daily.    [provider]  pantoprazole (PROTONIX) 40 MG tablet TAKE (1) TABLET BY MOUTH ONCE DAILY. 08/18/21   Harvel Quale, MD  polyethylene glycol powder (GLYCOLAX/MIRALAX) 17 GM/SCOOP powder Take 17 g by mouth daily. Patient taking differently: Take 17 g by mouth daily. As needed per patient. 06/03/20   Rehman, Mechele Dawley, MD  rosuvastatin (CRESTOR) 5 MG tablet Take 1 tablet (5 mg total) by mouth daily. 09/18/21   Coral Spikes, DO  saline (AYR) GEL Place 1 application into both nostrils daily as needed (dryness).    [provider]  sertraline (ZOLOFT) 100 MG tablet Take 1.5 tab p.o. daily. Patient taking differently: Take 150 mg by mouth daily. 06/19/21   Elvia Collum M, DO  Sodium Fluoride (PREVIDENT 5000 BOOSTER) 1.1 % PSTE Place 1 application onto teeth at bedtime. 10/09/20   Lovena Le, Malena M, DO   valACYclovir (VALTREX) 1000 MG tablet TAKE 1 TABLET BY MOUTH THREE TIMES DAILY FOR 7 DAYS. 08/18/21   Erven Colla, DO    Family History Family History  Problem Relation Age of Onset   COPD Mother    Osteoporosis Mother    Immunodeficiency Mother    COPD Father    Emphysema Father    Tuberculosis Father    Lung disease Father    COPD Sister    Healthy Daughter    Healthy Son    Arthritis Other    Lung disease Other    Cancer Other    Asthma Other    Eczema Neg Hx    Atopy Neg Hx    Angioedema Neg Hx    Allergic rhinitis Neg Hx     Social History Social History   Tobacco Use   Smoking status: Former    Packs/day: 1.50    Years: 27.00    Pack years: 40.50    Types: Cigarettes  Start date: 6    Quit date: 2000    Years since quitting: 23.0   Smokeless tobacco: Never  Vaping Use   Vaping Use: Never used  Substance Use Topics   Alcohol use: No   Drug use: No     Allergies   Penicillins   Review of Systems Review of Systems Per HPI  Physical Exam Triage Vital Signs ED Triage Vitals  Enc Vitals Group     BP 12/01/21 1508 (!) 147/78     Pulse Rate 12/01/21 1508 (!) 115     Resp 12/01/21 1508 20     Temp 12/01/21 1508 98.2 F (36.8 C)     Temp src --      SpO2 12/01/21 1508 94 %     Weight --      Height --      Head Circumference --      Peak Flow --      Pain Score 12/01/21 1506 3     Pain Loc --      Pain Edu? --      Excl. in [place]? --    No data found.  Updated Vital Signs BP (!) 147/78    Pulse (!) 115    Temp 98.2 F (36.8 C)    Resp 20    SpO2 94%   Visual Acuity Right Eye Distance:   Left Eye Distance:   Bilateral Distance:    Right Eye Near:   Left Eye Near:    Bilateral Near:     Physical Exam Vitals and nursing note reviewed.  Constitutional:      Appearance: Normal appearance.  HENT:     Head: Atraumatic.     Right Ear: Tympanic membrane and external ear normal.     Left Ear: Tympanic membrane and external ear  normal.     Nose: Rhinorrhea present.     Mouth/Throat:     Mouth: Mucous membranes are moist.     Pharynx: Posterior oropharyngeal erythema present.  Eyes:     Extraocular Movements: Extraocular movements intact.     Conjunctiva/sclera: Conjunctivae normal.  Cardiovascular:     Rate and Rhythm: Normal rate and regular rhythm.     Heart sounds: Normal heart sounds.  Pulmonary:     Effort: Pulmonary effort is normal.     Breath sounds: Wheezing present. No rales.  Musculoskeletal:        General: Normal range of motion.     Cervical back: Normal range of motion and neck supple.  Skin:    General: Skin is warm and dry.  Neurological:     Mental Status: She is alert and oriented to person, place, and time.  Psychiatric:        Mood and Affect: Mood normal.        Thought Content: Thought content normal.     UC Treatments / Results  Labs (all labs ordered are listed, but only abnormal results are displayed) Labs Reviewed  COVID-19, FLU A+B NAA    EKG   Radiology No results found.  Procedures Procedures (including critical care time)  Medications Ordered in UC Medications  ondansetron (ZOFRAN-ODT) disintegrating tablet 4 mg (has no administration in time range)    Initial Impression / Assessment and Plan / UC Course  I have reviewed the triage vital signs and the nursing notes.  Pertinent labs & imaging results that were available during my care of the patient were reviewed by me and considered in  my medical decision making (see chart for details).     Suspect viral URI causing an asthma exacerbation.  COVID and flu swab pending, will treat with prednisone, Phenergan DM, continued albuterol nebulizers and inhalers while awaiting COVID and flu results.  Adjust as needed based on these.  Discussed supportive over-the-counter medications and home care.  Return for acutely worsening symptoms.  Final Clinical Impressions(s) / UC Diagnoses   Final diagnoses:  Viral  URI with cough  Fever, unspecified  Mild intermittent asthma with acute exacerbation   Discharge Instructions   None    ED Prescriptions     Medication Sig Dispense Auth. Provider   predniSONE (DELTASONE) 20 MG tablet Take 2 tablets (40 mg total) by mouth daily with breakfast. 10 tablet Particia Nearing, PA-C   promethazine-dextromethorphan (PROMETHAZINE-DM) 6.25-15 MG/5ML syrup Take 5 mLs by mouth 4 (four) times daily as needed. 100 mL Particia Nearing, [place]      PDMP not reviewed this encounter.   Particia Nearing, [place] 12/01/21 1556

## 2021-12-03 LAB — COVID-19, FLU A+B NAA
Influenza A, NAA: NOT DETECTED
Influenza B, NAA: NOT DETECTED
SARS-CoV-2, NAA: NOT DETECTED

## 2021-12-04 ENCOUNTER — Ambulatory Visit: Payer: PPO | Admitting: Allergy

## 2021-12-04 ENCOUNTER — Other Ambulatory Visit: Payer: Self-pay

## 2021-12-04 ENCOUNTER — Encounter: Payer: Self-pay | Admitting: Allergy

## 2021-12-04 VITALS — BP 158/90 | HR 102 | Temp 98.4°F | Resp 16

## 2021-12-04 DIAGNOSIS — J3089 Other allergic rhinitis: Secondary | ICD-10-CM

## 2021-12-04 DIAGNOSIS — J302 Other seasonal allergic rhinitis: Secondary | ICD-10-CM | POA: Insufficient documentation

## 2021-12-04 DIAGNOSIS — J45901 Unspecified asthma with (acute) exacerbation: Secondary | ICD-10-CM | POA: Diagnosis not present

## 2021-12-04 DIAGNOSIS — K219 Gastro-esophageal reflux disease without esophagitis: Secondary | ICD-10-CM

## 2021-12-04 DIAGNOSIS — J45998 Other asthma: Secondary | ICD-10-CM | POA: Diagnosis not present

## 2021-12-04 MED ORDER — MONTELUKAST SODIUM 10 MG PO TABS: 10.0000 mg | ORAL_TABLET | Freq: Every day | ORAL | 5 refills | Status: DC

## 2021-12-04 MED ORDER — BUDESONIDE 0.5 MG/2ML IN SUSP: 0.5000 mg | Freq: Two times a day (BID) | RESPIRATORY_TRACT | 2 refills | Status: DC

## 2021-12-04 MED ORDER — ALBUTEROL SULFATE (2.5 MG/3ML) 0.083% IN NEBU
2.5000 mg | INHALATION_SOLUTION | RESPIRATORY_TRACT | 1 refills | Status: DC | PRN
Start: 2021-12-04 — End: 2021-12-24

## 2021-12-04 NOTE — Assessment & Plan Note (Signed)
Stable

## 2021-12-04 NOTE — Patient Instructions (Addendum)
Asthma exacerbation Please go to the ER - need continuous nebulizer treatments and possibly IV steroids.  IM Depo 80mg  injection given today.  Xopenex nebulizer x 2 treatments given in the office today.  Take prednisone 40mg  daily x 2 days, 30mg  daily x 2 days, 20mg  daily x 2 days and 10mg  daily x 2 days. Tablets given today.  Daily controller medication(s): START Breztri 2 puffs twice a day with spacer and rinse mouth afterwards. Samples given.  Nebulizer machine given today.  During upper respiratory infections/asthma flares:  Start budesonide 0.5mg  nebulizer twice a day for 1-2 weeks until your breathing symptoms return to baseline.  Pretreat with albuterol 2 puffs or albuterol nebulizer.  If you need to use your albuterol nebulizer machine back to back within 15-30 minutes with no relief then please go to the ER/urgent care for further evaluation.   May use albuterol rescue inhaler 2 puffs every 4 to 6 hours as needed for shortness of breath, chest tightness, coughing, and wheezing. May use albuterol rescue inhaler 2 puffs 5 to 15 minutes prior to strenuous physical activities. Monitor frequency of use.  Asthma control goals:  Full participation in all desired activities (may need albuterol before activity) Albuterol use two times or less a week on average (not counting use with activity) Cough interfering with sleep two times or less a month Oral steroids no more than once a year No hospitalizations   Allergic rhinitis: Start Singulair (montelukast) 10mg  daily at night. Cautioned that in some children/adults can experience behavioral changes including hyperactivity, agitation, depression, sleep disturbances and suicidal ideations. These side effects are rare, but if you notice them you should notify me and discontinue Singulair (montelukast).  Continue allergen avoidance measures directed toward tree pollen, weed pollen, ragweed pollen, and dust mite. Use Flonase (fluticasone) nasal  spray 2 sprays per nostril once a day as needed for nasal congestion.  Nasal saline spray (i.e., Simply Saline) or nasal saline lavage (i.e., NeilMed) is recommended as needed and prior to medicated nasal sprays.  Follow up in 1 week or sooner if needed.  Drink plenty of fluids. Water, juice, clear broth or warm lemon water are good choices. Avoid caffeine and alcohol, which can dehydrate you. Eat chicken soup. Chicken soup and other warm fluids can be soothing and loosen congestion. Rest. Adjust your room's temperature and humidity. Keep your room warm but not overheated. If the air is dry, a cool-mist humidifier or vaporizer can moisten the air and help ease congestion and coughing. Keep the humidifier clean to prevent the growth of bacteria and molds. Soothe your throat. Perform a saltwater gargle. Dissolve one-quarter to a half teaspoon of salt in a 4- to 8-ounce glass of warm water. This can relieve a sore or scratchy throat temporarily. Use saline nasal drops. To help relieve nasal congestion, try saline nasal drops. You can buy these drops over the counter, and they can help relieve symptoms ? even in children. Take over-the-counter cold and cough medications. For adults and children older than 5, over-the-counter decongestants, antihistamines and pain relievers might offer some symptom relief. However, they won't prevent a cold or shorten its duration.

## 2021-12-04 NOTE — Progress Notes (Signed)
Follow Up Note  RE: [name] MRN: [id number] DOB: 1956/04/08 Date of Office Visit: 12/04/2021  Referring provider: [name], DO Primary care provider: [name], DO  Chief Complaint: Cough (Coughing really bad, wheezing, urgent care visit Monday 12/01/21 prednisone given, has been taking twice a day for three days)  History of Present Illness: I had the pleasure of seeing [name] for a follow up visit at the [facility] on 12/04/2021. She is a 65 y.o. female, who is being followed for asthma, allergic rhinitis and reflux. Her previous allergy office visit was on 08/04/2021 with Audrey Leyland, FNP. Today is a new complaint visit of breathing issues . She is accompanied today by her husband who provided/contributed to the history.   Asthma: Patient has been having with coughing, wheezing, shortness of breath, chest tightness for 5 days. No sick contacts.  Patient had fever with this initially but no more fevers. She was unable to sleep last night due to the coughing and per husband she is sounding worse today.  She has an old nebulizer machine and would like a new one.   Currently taking prednisone 40mg  daily and not sure if it's helping.   Using albuterol nebulizer 3-4 times per day and using albuterol HFA additionally throughout the day with minimal benefit.  She has not been using Symbicort or any other steroid inhalers and not sure if she has any at home.   Last flare was in the summer requiring prednisone.  Allergic rhinitis Currently not taking antihistamines. Using Flonase as needed.    Reflux Stable.   12/01/2021 UC visit: "Suspect viral URI causing an asthma exacerbation.  COVID and flu swab pending, will treat with prednisone, Phenergan DM, continued albuterol nebulizers and inhalers while awaiting COVID and flu results.  Adjust as needed based on these.  Discussed supportive over-the-counter medications and home care.  Return for  acutely worsening symptoms."  Assessment and Plan: Kjirsten is a 65 y.o. female with: Asthma with acute exacerbation URI symptoms 4 days ago and had negative Covid-19 and flu testing at urgent care. Having trouble sleeping due to coughing, wheezing, shortness of breath and chest tightness. Using albuterol nebulizer and albuterol HFA throughout the day with minimal benefit. No ICS inhaler currently. Last flare was in the summer requiring prednisone. Patient has diffuse wheezing audible without stethoscope. Slight improvement after 2 back to back xopenex nebulizer treatments. Discussed with patient and spouse that given her current clinical symptoms with minimal improvement with the treatment given in the office she needs to go to the ER. Husband is going to drive to the ER - offered calling EMS but declined. Advised patient that if she feels acute worsening symptoms on the way to the ER then to stop the car and call EMS. IM Depo 80mg  injection given today.  Xopenex nebulizer x 2 treatments given in the office today.  Take prednisone 40mg  daily x 2 days, 30mg  daily x 2 days, 20mg  daily x 2 days and 10mg  daily x 2 days. Tablets given today. Daily controller medication(s): START Breztri 2 puffs twice a day with spacer and rinse mouth afterwards. Samples given.  Nebulizer machine given today.  During upper respiratory infections/asthma flares:  Start budesonide 0.5mg  nebulizer twice a day for 1-2 weeks until your breathing symptoms return to baseline.  Pretreat with albuterol 2 puffs or albuterol nebulizer.  If you need to use your albuterol nebulizer machine back to back within 15-30 minutes  with no relief then please go to the ER/urgent care for further evaluation.  May use albuterol rescue inhaler 2 puffs every 4 to 6 hours as needed for shortness of breath, chest tightness, coughing, and wheezing. May use albuterol rescue inhaler 2 puffs 5 to 15 minutes prior to strenuous physical activities. Monitor  frequency of use.  Get spirometry at next visit. Given 2 exacerbations this year - will order bloodwork at next visit to see if patient qualifies for biologics.   Other allergic rhinitis Start Singulair (montelukast) 10mg  daily at night. Cautioned that in some children/adults can experience behavioral changes including hyperactivity, agitation, depression, sleep disturbances and suicidal ideations. These side effects are rare, but if you notice them you should notify me and discontinue Singulair (montelukast). Continue allergen avoidance measures directed toward tree pollen, weed pollen, ragweed pollen, and dust mite. Use Flonase (fluticasone) nasal spray 2 sprays per nostril once a day as needed for nasal congestion.  Nasal saline spray (i.e., Simply Saline) or nasal saline lavage (i.e., NeilMed) is recommended as needed and prior to medicated nasal sprays.  Gastroesophageal reflux disease Stable.  Return in about 1 week (around 12/11/2021).  Meds ordered this encounter  Medications   budesonide (PULMICORT) 0.5 MG/2ML nebulizer solution    Sig: Take 2 mLs (0.5 mg total) by nebulization in the morning and at bedtime.    Dispense:  120 mL    Refill:  2   montelukast (SINGULAIR) 10 MG tablet    Sig: Take 1 tablet (10 mg total) by mouth at bedtime.    Dispense:  30 tablet    Refill:  5   albuterol (PROVENTIL) (2.5 MG/3ML) 0.083% nebulizer solution    Sig: Take 3 mLs (2.5 mg total) by nebulization every 4 (four) hours as needed for wheezing or shortness of breath (coughing fits).    Dispense:  150 mL    Refill:  1   Lab Orders  No laboratory test(s) ordered today    Diagnostics: None.   Medication List:  Current Outpatient Medications  Medication Sig Dispense Refill   albuterol (PROVENTIL) (2.5 MG/3ML) 0.083% nebulizer solution Take 3 mLs (2.5 mg total) by nebulization every 4 (four) hours as needed for wheezing or shortness of breath (coughing fits). 150 mL 1   albuterol  (VENTOLIN HFA) 108 (90 Base) MCG/ACT inhaler Inhale 1-2 puffs into the lungs every 6 (six) hours as needed for wheezing or shortness of breath. 18 g 0   budesonide (PULMICORT) 0.5 MG/2ML nebulizer solution Take 2 mLs (0.5 mg total) by nebulization in the morning and at bedtime. 120 mL 2   fluticasone (FLONASE) 50 MCG/ACT nasal spray INHALE 2 SPRAYS IN EACH NOSTRIL ONCE DAILY. (Patient taking differently: As needed per patient.) 16 g 0   gabapentin (NEURONTIN) 100 MG capsule Take 100 mg by mouth at bedtime as needed (back pain).      hydrocortisone 2.5 % cream Apply topically.     levothyroxine (SYNTHROID) 137 MCG tablet TAKE (1) TABLET BY MOUTH ONCE DAILY BEFORE BREAKFAST. 90 tablet 1   lisinopril (ZESTRIL) 20 MG tablet TAKE (1) TABLET BY MOUTH ONCE DAILY. 90 tablet 1   meloxicam (MOBIC) 7.5 MG tablet Take 7.5 mg by mouth daily.     metroNIDAZOLE (METROGEL) 0.75 % gel Apply topically daily.     montelukast (SINGULAIR) 10 MG tablet Take 1 tablet (10 mg total) by mouth at bedtime. 30 tablet 5   Naphazoline-Pheniramine (OPCON-A) 0.027-0.315 % SOLN Place 1 drop into both eyes daily.  polyethylene glycol powder (GLYCOLAX/MIRALAX) 17 GM/SCOOP powder Take 17 g by mouth daily. (Patient taking differently: Take 17 g by mouth daily. As needed per patient.) 507 g 5   predniSONE (DELTASONE) 20 MG tablet Take 2 tablets (40 mg total) by mouth daily with breakfast. 10 tablet 0   promethazine-dextromethorphan (PROMETHAZINE-DM) 6.25-15 MG/5ML syrup Take 5 mLs by mouth 4 (four) times daily as needed. 100 mL 0   rosuvastatin (CRESTOR) 5 MG tablet Take 1 tablet (5 mg total) by mouth daily. 90 tablet 1   saline (AYR) GEL Place 1 application into both nostrils daily as needed (dryness).     sertraline (ZOLOFT) 100 MG tablet Take 1.5 tab p.o. daily. (Patient taking differently: Take 150 mg by mouth daily.) 135 tablet 0   Sodium Fluoride (PREVIDENT 5000 BOOSTER) 1.1 % PSTE Place 1 application onto teeth at bedtime. 100  g 3   No current facility-administered medications for this visit.   Allergies: Allergies  Allergen Reactions   Penicillins Hives   I reviewed her past medical history, social history, family history, and environmental history and no significant changes have been reported from her previous visit.  Review of Systems  Constitutional:  Negative for appetite change, chills, fever and unexpected weight change.  HENT:  Negative for congestion and rhinorrhea.   Eyes:  Negative for itching.  Respiratory:  Positive for cough, chest tightness, shortness of breath and wheezing.   Gastrointestinal:  Negative for abdominal pain.  Skin:  Negative for rash.  Neurological:  Negative for headaches.   Objective: BP (!) 158/90 (BP Location: Right Arm, Patient Position: Sitting, Cuff Size: Normal)    Pulse (!) 102    Temp 98.4 F (36.9 C) (Temporal)    Resp 16    SpO2 94%  There is no height or weight on file to calculate BMI. Physical Exam Vitals and nursing note reviewed.  Constitutional:      Appearance: Normal appearance. She is well-developed.  HENT:     Head: Normocephalic and atraumatic.     Right Ear: Tympanic membrane and external ear normal.     Left Ear: Tympanic membrane and external ear normal.     Nose: Nose normal.     Mouth/Throat:     Mouth: Mucous membranes are moist.     Pharynx: Oropharynx is clear.  Eyes:     Conjunctiva/sclera: Conjunctivae normal.  Cardiovascular:     Rate and Rhythm: Normal rate and regular rhythm.     Heart sounds: Normal heart sounds. No murmur heard. Pulmonary:     Breath sounds: Wheezing present.     Comments: Wheezing audible without stethoscope. Slight improvement after 2 xopenex nebulizer treatments.  Musculoskeletal:     Cervical back: Neck supple.  Skin:    General: Skin is warm.     Findings: No rash.  Neurological:     Mental Status: She is alert and oriented to person, place, and time.  Psychiatric:        Behavior: Behavior normal.    Previous notes and tests were reviewed. The plan was reviewed with the patient/family, and all questions/concerned were addressed.  It was my pleasure to see Loida today and participate in her care. Please feel free to contact me with any questions or concerns.  Sincerely,  Rexene Alberts, DO Allergy & Immunology  [facility] office: [place] office: [phone]

## 2021-12-04 NOTE — Assessment & Plan Note (Signed)
·   Start Singulair (montelukast) 10mg  daily at night.  Cautioned that in some children/adults can experience behavioral changes including hyperactivity, agitation, depression, sleep disturbances and suicidal ideations. These side effects are rare, but if you notice them you should notify me and discontinue Singulair (montelukast).  Continue allergen avoidance measures directed toward tree pollen, weed pollen, ragweed pollen, and dust mite.  Use Flonase (fluticasone) nasal spray 2 sprays per nostril once a day as needed for nasal congestion.   Nasal saline spray (i.e., Simply Saline) or nasal saline lavage (i.e., NeilMed) is recommended as needed and prior to medicated nasal sprays.

## 2021-12-04 NOTE — Assessment & Plan Note (Addendum)
URI symptoms 4 days ago and had negative Covid-19 and flu testing at urgent care. Having trouble sleeping due to coughing, wheezing, shortness of breath and chest tightness. Using albuterol nebulizer and albuterol HFA throughout the day with minimal benefit. No ICS inhaler currently. Last flare was in the summer requiring prednisone.  Patient has diffuse wheezing audible without stethoscope. Slight improvement after 2 back to back xopenex nebulizer treatments.  Discussed with patient and spouse that given her current clinical symptoms with minimal improvement with the treatment given in the office she needs to go to the ER.  Husband is going to drive to the ER - offered calling EMS but declined.  Advised patient that if she feels acute worsening symptoms on the way to the ER then to stop the car and call EMS.  IM Depo 80mg  injection given today.   Xopenex nebulizer x 2 treatments given in the office today.   Take prednisone 40mg  daily x 2 days, 30mg  daily x 2 days, 20mg  daily x 2 days and 10mg  daily x 2 days. Tablets given today.  Daily controller medication(s): START Breztri 2 puffs twice a day with spacer and rinse mouth afterwards. Samples given.   Nebulizer machine given today.   During upper respiratory infections/asthma flares:  o Start budesonide 0.5mg  nebulizer twice a day for 1-2 weeks until your breathing symptoms return to baseline.  o Pretreat with albuterol 2 puffs or albuterol nebulizer.  o If you need to use your albuterol nebulizer machine back to back within 15-30 minutes with no relief then please go to the ER/urgent care for further evaluation.   May use albuterol rescue inhaler 2 puffs every 4 to 6 hours as needed for shortness of breath, chest tightness, coughing, and wheezing. May use albuterol rescue inhaler 2 puffs 5 to 15 minutes prior to strenuous physical activities. Monitor frequency of use.   Get spirometry at next visit.  Given 2 exacerbations this year -  will order bloodwork at next visit to see if patient qualifies for biologics.

## 2021-12-08 ENCOUNTER — Other Ambulatory Visit: Payer: Self-pay | Admitting: Family Medicine

## 2021-12-08 ENCOUNTER — Telehealth: Payer: Self-pay | Admitting: Allergy

## 2021-12-08 DIAGNOSIS — F321 Major depressive disorder, single episode, moderate: Secondary | ICD-10-CM

## 2021-12-08 NOTE — Telephone Encounter (Signed)
Called and spoke with patient and she stated that she is doing much better. She stated that she was able to pick up her new nebulizer and is feeling much better. I advised to call us if she needs anything further. Patient verbalized understanding.

## 2021-12-08 NOTE — Telephone Encounter (Signed)
Please call patient and see how she is doing.   She had asthma exacerbation last week requiring 2 neb treatments in the office and still wheezing. Recommended that she go to the ER but I don't see any ER notes in the chart.   Thank you.

## 2021-12-12 ENCOUNTER — Other Ambulatory Visit: Payer: Self-pay

## 2021-12-12 ENCOUNTER — Ambulatory Visit: Payer: PPO | Admitting: Allergy & Immunology

## 2021-12-12 ENCOUNTER — Ambulatory Visit (HOSPITAL_COMMUNITY)
Admission: RE | Admit: 2021-12-12 | Discharge: 2021-12-12 | Disposition: A | Discharge status: Home / Self Care | Payer: PPO | Source: Ambulatory Visit | Attending: Allergy & Immunology | Admitting: Allergy & Immunology

## 2021-12-12 VITALS — BP 130/70 | HR 79 | Temp 98.2°F | Resp 16 | Ht 62.0 in | Wt 172.6 lb

## 2021-12-12 DIAGNOSIS — K219 Gastro-esophageal reflux disease without esophagitis: Secondary | ICD-10-CM | POA: Diagnosis not present

## 2021-12-12 DIAGNOSIS — R0602 Shortness of breath: Secondary | ICD-10-CM

## 2021-12-12 DIAGNOSIS — J4541 Moderate persistent asthma with (acute) exacerbation: Secondary | ICD-10-CM | POA: Diagnosis not present

## 2021-12-12 DIAGNOSIS — R059 Cough, unspecified: Secondary | ICD-10-CM | POA: Diagnosis not present

## 2021-12-12 DIAGNOSIS — J3089 Other allergic rhinitis: Secondary | ICD-10-CM

## 2021-12-12 MED ORDER — IPRATROPIUM-ALBUTEROL 0.5-2.5 (3) MG/3ML IN SOLN: 3.0000 mL | RESPIRATORY_TRACT | 0 refills | Status: DC | PRN

## 2021-12-12 MED ORDER — CEFDINIR 300 MG PO CAPS: 300.0000 mg | ORAL_CAPSULE | Freq: Two times a day (BID) | ORAL | 0 refills | Status: AC

## 2021-12-12 MED ORDER — BREZTRI AEROSPHERE 160-9-4.8 MCG/ACT IN AERO: 2.0000 | INHALATION_SPRAY | Freq: Two times a day (BID) | RESPIRATORY_TRACT | 5 refills | Status: DC

## 2021-12-12 MED ORDER — PREDNISONE 10 MG PO TABS: ORAL_TABLET | ORAL | 0 refills | Status: DC

## 2021-12-12 NOTE — Patient Instructions (Addendum)
SOB (shortness of breath) - with acute asthma exacerbation - Lung testing actually looked great today. - We are going to start a longer prednisone taper over 9 days.  - Start cefdinir 300mg  twice daily for two weeks (CAN cause red stools). - Get a chest X-ray at [facility].   2. Return in about 3 days (around 12/15/2021).    Please inform 02/12/2022 of any Emergency Department visits, hospitalizations, or changes in symptoms. Call us before going to the ED for breathing or allergy symptoms since we might be able to fit you in for a sick visit. Feel free to contact us anytime with any questions, problems, or concerns.  It was a pleasure to see you again today!  Websites that have reliable patient information: 1. American Academy of Asthma, Allergy, and Immunology: www.aaaai.org 2. Food Allergy Research and Education (FARE): foodallergy.org 3. Mothers of Asthmatics: http://www.asthmacommunitynetwork.org 4. American College of Allergy, Asthma, and Immunology: www.acaai.org   COVID-19 Vaccine Information can be found at: [name] For questions related to vaccine distribution or appointments, please email [email or web address] or call [phone].   We realize that you might be concerned about having an allergic reaction to the COVID19 vaccines. To help with that concern, WE ARE OFFERING THE COVID19 VACCINES IN OUR OFFICE! Ask the front desk for dates!     Like [phone] on [name] and Instagram for our latest updates!      A healthy democracy works best when [employer] participate! Make sure you are registered to vote! If you have moved or changed any of your contact information, you will need to get this updated before voting!  In some cases, you MAY be able to register to vote online: [employer]

## 2021-12-12 NOTE — Progress Notes (Signed)
FOLLOW UP  Date of Service/Encounter:  12/15/21   Assessment:   Moderate persistent asthma,   Absolute eosinophil count ranging from 200-300   Seasonal and perennial allergic rhinitis (ragweed, weeds, trees and dust mites)   Inability to tolerate antihistamines (overly drying)  Plan/Recommendations:   SOB (shortness of breath) - with acute asthma exacerbation - Lung testing actually looked great today. - We are going to start a longer prednisone taper over 9 days.  - Start cefdinir 300mg  twice daily for two weeks (CAN cause red stools). - [facility].   2. Return in about 3 days (around 12/15/2021).    Subjective:   [name] is a 66 y.o. female presenting today for follow up of  Chief Complaint  Patient presents with   Asthma    Says she started coughing heavily christmas eve. Christmas day she had trouble breathing. Dr. Selena BattenKim saw patient and started her a prednisone taper. Using nebulizer often. She was started on Breztri and says she has yet to get it due to PA.    Audrey DoughtyCathy H [name] has a history of the following: Patient Active Problem List   Diagnosis Date Noted   Other allergic rhinitis 12/04/2021   Right shoulder pain 09/18/2021   Healthcare maintenance 09/18/2021   Panic attack 09/05/2021   IBS (irritable bowel syndrome) 07/31/2021   Asthma with acute exacerbation 01/20/2021   Prediabetes 12/13/2017   Hyperlipidemia 12/04/2016   Rosacea 03/07/2014   Chronic low back pain 10/22/2013   Anxiety 08/23/2013   Depression 05/15/2013   Urinary incontinence in female 02/23/2013   Gastroesophageal reflux disease 09/19/2012   Intestinal bacterial overgrowth 07/25/2012   Essential hypertension 07/25/2012   Hypothyroidism 07/25/2012   CTS (carpal tunnel syndrome) 04/21/2012   Trigger finger 04/21/2012    History obtained from: chart review and patient.  Audrey AngCathy is a 66 y.o. female presenting for a follow up visit.  She was last seen in  December 2022 for an asthma exacerbation.  At that time, she was started on a prednisone burst.  She was started on Breztri 2 puffs twice daily as well as albuterol as needed.  For her flares, she was started on budesonide twice daily for 1 to 2 weeks.  For her allergic rhinitis, she was started on montelukast and continued on Flonase.  Since last visit, she has not done well. She has been taking her prednisone and today is her last day of prednisone. She not felt that the prednisone helped much at all.   She is unsure what triggered this. She started with a cough and she progressed to worsen. She went to Urgent Care for five hours and she was placed on prednisone. It did not get any better and actually got worse.  She tells me that she was can go to the emergency room if we would not open today, so she was glad that she was able to get in to see us.  She was COVID and influenza negative. She never got a CXR and she never had a fever. No one else in the house has been sick at all. Last antibiotics was ages ago.   Singulair had never been added. She has not started it yet. She wanted to see what I thought before she started it.   Otherwise, there have been no changes to her past medical history, surgical history, family history, or social history.    Review of Systems  Constitutional:  Positive for  chills and malaise/fatigue. Negative for fever and weight loss.  HENT: Negative.  Negative for congestion, ear discharge and ear pain.   Eyes:  Negative for pain, discharge and redness.  Respiratory:  Positive for cough and shortness of breath. Negative for sputum production and wheezing.   Cardiovascular: Negative.  Negative for chest pain and palpitations.  Gastrointestinal:  Negative for abdominal pain, constipation, diarrhea, heartburn, nausea and vomiting.  Skin: Negative.  Negative for itching and rash.  Neurological:  Negative for dizziness and headaches.  Endo/Heme/Allergies:  Negative for  environmental allergies. Does not bruise/bleed easily.      Objective:   Blood pressure 130/70, pulse 79, temperature 98.2 F (36.8 C), temperature source Temporal, resp. rate 16, height 5\' 2"  (1.575 m), weight 172 lb 9.6 oz (78.3 kg), SpO2 94 %. Body mass index is 31.57 kg/m.   Physical Exam:  Physical Exam Vitals reviewed.  Constitutional:      Appearance: She is well-developed. She is ill-appearing.  HENT:     Head: Normocephalic and atraumatic.     Right Ear: Tympanic membrane, ear canal and external ear normal.     Left Ear: Tympanic membrane, ear canal and external ear normal.     Nose: No nasal deformity, septal deviation, mucosal edema or rhinorrhea.     Right Turbinates: Enlarged and swollen.     Left Turbinates: Enlarged and swollen.     Right Sinus: No maxillary sinus tenderness or frontal sinus tenderness.     Left Sinus: No maxillary sinus tenderness or frontal sinus tenderness.     Comments: Turbinates erythematous.    Mouth/Throat:     Mouth: Mucous membranes are not pale and not dry.     Pharynx: Uvula midline.  Eyes:     General: Lids are normal. No allergic shiner.       Right eye: No discharge.        Left eye: No discharge.     Conjunctiva/sclera: Conjunctivae normal.     Right eye: Right conjunctiva is not injected. No chemosis.    Left eye: Left conjunctiva is not injected. No chemosis.    Pupils: Pupils are equal, round, and reactive to light.  Cardiovascular:     Rate and Rhythm: Normal rate and regular rhythm.     Heart sounds: Normal heart sounds.  Pulmonary:     Effort: Pulmonary effort is normal. No tachypnea, accessory muscle usage or respiratory distress.     Breath sounds: Rhonchi present. No wheezing or rales.     Comments: Tachypneic. She is able to make full sentences. No wheezing noted.  Chest:     Chest wall: No tenderness.  Lymphadenopathy:     Cervical: No cervical adenopathy.  Skin:    Coloration: Skin is not pale.      Findings: No abrasion, erythema, petechiae or rash. Rash is not papular, urticarial or vesicular.  Neurological:     Mental Status: She is alert.  Psychiatric:        Behavior: Behavior is cooperative.     Diagnostic studies:   Spirometry: results normal (FEV1: 1.78//81%, FVC: 2.32/83%, FEV1/FVC: 77%).    Spirometry consistent with normal pattern.    EXAM: CHEST - 2 VIEW   COMPARISON:  05/13/2021   FINDINGS: Normal heart size, mediastinal contours, and pulmonary vascularity.   Lungs clear.   No pulmonary infiltrate, pleural effusion, or pneumothorax.   Prior cervicothoracic fusion.   Osseous structures otherwise unremarkable.   IMPRESSION: No acute abnormalities.   Allergy  Studies: none         Malachi Bonds, MD  [facility]

## 2021-12-15 ENCOUNTER — Ambulatory Visit: Payer: PPO | Admitting: Family Medicine

## 2021-12-15 ENCOUNTER — Encounter: Payer: Self-pay | Admitting: Family Medicine

## 2021-12-15 ENCOUNTER — Other Ambulatory Visit: Payer: Self-pay | Admitting: *Deleted

## 2021-12-15 ENCOUNTER — Encounter: Payer: Self-pay | Admitting: Allergy & Immunology

## 2021-12-15 ENCOUNTER — Other Ambulatory Visit: Payer: Self-pay

## 2021-12-15 VITALS — BP 138/90 | HR 84 | Temp 98.2°F | Resp 16 | Ht 62.0 in | Wt 171.2 lb

## 2021-12-15 DIAGNOSIS — K219 Gastro-esophageal reflux disease without esophagitis: Secondary | ICD-10-CM | POA: Diagnosis not present

## 2021-12-15 DIAGNOSIS — J4541 Moderate persistent asthma with (acute) exacerbation: Secondary | ICD-10-CM | POA: Diagnosis not present

## 2021-12-15 DIAGNOSIS — J3089 Other allergic rhinitis: Secondary | ICD-10-CM

## 2021-12-15 DIAGNOSIS — J302 Other seasonal allergic rhinitis: Secondary | ICD-10-CM

## 2021-12-15 MED ORDER — PANTOPRAZOLE SODIUM 40 MG PO TBEC: 40.0000 mg | DELAYED_RELEASE_TABLET | Freq: Every day | ORAL | 5 refills | Status: DC

## 2021-12-15 MED ORDER — IPRATROPIUM-ALBUTEROL 0.5-2.5 (3) MG/3ML IN SOLN: 3.0000 mL | RESPIRATORY_TRACT | 1 refills | Status: DC | PRN

## 2021-12-15 NOTE — Patient Instructions (Addendum)
Asthma Increase Breztri to 2 puffs twice a day with a spacer to prevent cough or wheeze  Begin montelukast 10 mg once a day to prevent cough or wheeze Continue albuterol via nebulizer once in the morning and once in the evening.  Wait 20 minutes and use your Symbicort inhaler. You may also use albuterol once every 4 hours if needed for shortness of breath, cough, or wheeze Seek emergency care if your symptoms worsen or you begin to experience respiratory distress Your eosinophils were elevated on a lab test you had at your primary care provider's office. This qualifies you for a biologic medication to control your asthma. We will move forward with this option if your asthma is not well controlled with the treatment plan as listed above Finish the course of cefdinir and prednisone that you  are taking now  Allergic rhinitis Continue allergen avoidance measures directed toward tree pollen, weed pollen, ragweed pollen, and dust mite as listed below Continue Flonase 2 sprays in each nostril once a day as needed for stuffy nose. In the right nostril, point the applicator out toward the right ear. In the left nostril, point the applicator out toward the left ear Consider saline nasal rinses as needed for nasal symptoms. Use this before any medicated nasal sprays for best result  Reflux Continue dietary and lifestyle modifications as listed below Restart pantoprazole 40 mg once a day to control reflux. This may decrease your cough and wheeze  Call the clinic if this treatment plan is not working well for you  Follow up in 1 month or sooner if needed  Reducing Pollen Exposure The American Academy of All[employer] and [employer] suggests the following steps to reduce your exposure to pollen during allergy seasons. Do not hang sheets or clothing out to dry; pollen may collect on these items. Do not mow lawns or spend time around freshly cut grass; mowing stirs up pollen. Keep windows closed at  night.  Keep car windows closed while driving. Minimize morning activities outdoors, a time when pollen counts are usually at their highest. Stay indoors as much as possible when pollen counts or humidity is high and on windy days when pollen tends to remain in the air longer. Use air conditioning when possible.  Many air conditioners have filters that trap the pollen spores. Use a HEPA room air filter to remove pollen form the indoor air you breathe.   Control of Dust Mite Allergen Dust mites play a major role in allergic asthma and rhinitis. They occur in environments with high humidity wherever human skin is found. Dust mites absorb humidity from the atmosphere (ie, they do not drink) and feed on organic matter (including shed human and animal skin). Dust mites are a microscopic type of insect that you cannot see with the naked eye. High levels of dust mites have been detected from mattresses, pillows, carpets, upholstered furniture, bed covers, clothes, soft toys and any woven material. The principal allergen of the dust mite is found in its feces. A gram of dust may contain 1,000 mites and 250,000 fecal particles. Mite antigen is easily measured in the air during house cleaning activities. Dust mites do not bite and do not cause harm to humans, other than by triggering allergies/asthma.  Ways to decrease your exposure to dust mites in your home:  1. Encase mattresses, box springs and pillows with a mite-impermeable barrier or cover  2. Wash sheets, blankets and drapes weekly in hot water (130 F) with detergent  and dry them in a dryer on the hot setting.  3. Have the room cleaned frequently with a vacuum cleaner and a damp dust-mop. For carpeting or rugs, vacuuming with a vacuum cleaner equipped with a high-efficiency particulate air (HEPA) filter. The dust mite allergic individual should not be in a room which is being cleaned and should wait 1 hour after cleaning before going into the  room.  4. Do not sleep on upholstered furniture (eg, couches).  5. If possible removing carpeting, upholstered furniture and drapery from the home is ideal. Horizontal blinds should be eliminated in the rooms where the person spends the most time (bedroom, study, television room). Washable vinyl, roller-type shades are optimal.  6. Remove all non-washable stuffed toys from the bedroom. Wash stuffed toys weekly like sheets and blankets above.  7. Reduce indoor humidity to less than 50%. Inexpensive humidity monitors can be purchased at most hardware stores. Do not use a humidifier as can make the problem worse and are not recommended.    Lifestyle Changes for Controlling GERD When you have GERD, stomach acid feels as if its backing up toward your mouth. Whether or not you take medication to control your GERD, your symptoms can often be improved with lifestyle changes.   Raise Your Head Reflux is more likely to strike when youre lying down flat, because stomach fluid can flow backward more easily. Raising the head of your bed 4-6 inches can help. To do this: Slide blocks or books under the legs at the head of your bed. Or, place a wedge under the mattress. Many foam stores can make a suitable wedge for you. The wedge should run from your waist to the top of your head. Dont just prop your head on several pillows. This increases pressure on your stomach. It can make GERD worse.  Watch Your Eating Habits Certain foods may increase the acid in your stomach or relax the lower esophageal sphincter, making GERD more likely. Its best to avoid the following: Coffee, tea, and carbonated drinks (with and without caffeine) Fatty, fried, or spicy food Mint, chocolate, onions, and tomatoes Any other foods that seem to irritate your stomach or cause you pain  Relieve the Pressure Eat smaller meals, even if you have to eat more often. Dont lie down right after you eat. Wait a few hours for  your stomach to empty. Avoid tight belts and tight-fitting clothes. Lose excess weight.  Tobacco and Alcohol Avoid smoking tobacco and drinking alcohol. They can make GERD symptoms worse.

## 2021-12-15 NOTE — Progress Notes (Signed)
[address] [place] [address] Dept: [phone]  FOLLOW UP NOTE  Patient ID: [name], female    DOB: 1956-08-19  Age: 66 y.o. MRN: [id number] Date of Office Visit: 12/15/2021  Assessment  Chief Complaint: Asthma (Says she is unsure of how she is feeling. )  HPI [name] is a 66 year old female who presents to the clinic for follow-up visit.  She was last seen in this clinic on 12/12/2021 by Dr. Dellis Anes for asthma with acute exacerbation requiring extended prednisone taper, allergic rhinitis, and reflux.  Prior to that visit, she had been seen on 12/04/2021 by Dr. Selena Batten for wheeze for which she was given Depo-Medrol and advised to go to the emergency department.  At today's visit, she reports her asthma has been moderately well controlled with symptoms including shortness of breath especially with activity, wheezing occurring mostly at night, and deep dry cough occurring mostly at night.  She continues Breztri occasionally and albuterol if she is out and about and feeling shortness of breath.  She is not currently using montelukast or any nebulized medications. Of note, she did have lab work on 02/17/2021 indicating absolute eosinophil 0.2.  She has a 20-year smoking history and stopped smoking cigarettes in 2003.  Allergic rhinitis is reported as moderately well controlled with clear rhinorrhea occurring from the right nostril only and occasional sneezing especially when she goes to the basement.  She continues nasal saline rinses and Flonase at nighttime and is not currently taking an antihistamine.  She has previously visited with an ear nose and throat specialis with who noted a deviated septum.  Reflux is reported as poorly controlled with occasional heartburn.  She is not currently taking a reflux to controlled medication, however, she has previously taken pantoprazole 40 mg once a day with good control of reflux.  Her current medications are listed in the  chart.   Drug Allergies:  Allergies  Allergen Reactions   Penicillins Hives    Physical Exam: BP 138/90    Pulse 84    Temp 98.2 F (36.8 C) (Temporal)    Resp 16    Ht 5\' 2"  (1.575 m)    Wt 171 lb 3.2 oz (77.7 kg)    SpO2 95%    BMI 31.31 kg/m    Physical Exam Vitals reviewed.  Constitutional:      Appearance: Normal appearance.  HENT:     Head: Normocephalic and atraumatic.     Right Ear: Tympanic membrane normal.     Left Ear: Tympanic membrane normal.     Nose:     Comments: Bilateral nares slightly erythematous with clear nasal drainage noted.  Septal deviation noted.  Ears normal.  Eyes normal.    Mouth/Throat:     Pharynx: Oropharynx is clear.  Eyes:     Conjunctiva/sclera: Conjunctivae normal.  Cardiovascular:     Rate and Rhythm: Normal rate and regular rhythm.     Heart sounds: Normal heart sounds. No murmur heard. Pulmonary:     Effort: Pulmonary effort is normal.     Breath sounds: Normal breath sounds.     Comments: Lungs clear to auscultation Musculoskeletal:        General: Normal range of motion.     Cervical back: Normal range of motion and neck supple.  Skin:    General: Skin is warm and dry.  Neurological:     Mental Status: She is alert and oriented to person, place, and time.  Psychiatric:        Mood and Affect: Mood normal.        Behavior: Behavior normal.        Thought Content: Thought content normal.        Judgment: Judgment normal.    Diagnostics: FVC 2.47, FEV1 1.83.  Predicted FVC 2.0, predicted FEV1 2.20.  Spirometry indicates normal ventilatory function.  Assessment and Plan: 1. Moderate persistent asthma with acute exacerbation   2. Gastroesophageal reflux disease, unspecified whether esophagitis present   3. Seasonal and perennial allergic rhinitis     Meds ordered this encounter  Medications   pantoprazole (PROTONIX) 40 MG tablet    Sig: Take 1 tablet (40 mg total) by mouth daily.    Dispense:  31 tablet    Refill:  5     Patient Instructions  Asthma Increase Breztri to 2 puffs twice a day with a spacer to prevent cough or wheeze  Begin montelukast 10 mg once a day to prevent cough or wheeze Continue albuterol via nebulizer once in the morning and once in the evening.  Wait 20 minutes and use your Symbicort inhaler. You may also use albuterol once every 4 hours if needed for shortness of breath, cough, or wheeze Seek emergency care if your symptoms worsen or you begin to experience respiratory distress Your eosinophils were elevated on a lab test you had at your primary care provider's office. This qualifies you for a biologic medication to control your asthma. We will move forward with this option if your asthma is not well controlled with the treatment plan as listed above Finish the course of cefdinir and prednisone that you  are taking now  Allergic rhinitis Continue allergen avoidance measures directed toward tree pollen, weed pollen, ragweed pollen, and dust mite as listed below Continue Flonase 2 sprays in each nostril once a day as needed for stuffy nose. In the right nostril, point the applicator out toward the right ear. In the left nostril, point the applicator out toward the left ear Consider saline nasal rinses as needed for nasal symptoms. Use this before any medicated nasal sprays for best result  Reflux Continue dietary and lifestyle modifications as listed below Restart pantoprazole 40 mg once a day to control reflux. This may decrease your cough and wheeze  Call the clinic if this treatment plan is not working well for you  Follow up in 1 month or sooner if needed   Return in about 4 weeks (around 01/12/2022), or if symptoms worsen or fail to improve.      Thank you for the opportunity to care for this patient.  Please do not hesitate to contact me with questions.  Thermon Leyland, FNP [facility]

## 2021-12-24 ENCOUNTER — Telehealth (INDEPENDENT_AMBULATORY_CARE_PROVIDER_SITE_OTHER): Payer: Self-pay | Admitting: *Deleted

## 2021-12-24 MED ORDER — BREZTRI AEROSPHERE 160-9-4.8 MCG/ACT IN AERO: 2.0000 | INHALATION_SPRAY | Freq: Two times a day (BID) | RESPIRATORY_TRACT | 5 refills | Status: DC

## 2021-12-24 MED ORDER — BUDESONIDE 0.5 MG/2ML IN SUSP: 0.5000 mg | Freq: Two times a day (BID) | RESPIRATORY_TRACT | 2 refills | Status: DC

## 2021-12-24 MED ORDER — FLUTICASONE PROPIONATE 50 MCG/ACT NA SUSP: 2.0000 | Freq: Every day | NASAL | 5 refills | Status: DC

## 2021-12-24 MED ORDER — ALBUTEROL SULFATE (2.5 MG/3ML) 0.083% IN NEBU: 2.5000 mg | INHALATION_SOLUTION | RESPIRATORY_TRACT | 2 refills | Status: DC | PRN

## 2021-12-24 MED ORDER — MONTELUKAST SODIUM 10 MG PO TABS: 10.0000 mg | ORAL_TABLET | Freq: Every day | ORAL | 5 refills | Status: DC

## 2021-12-24 NOTE — Telephone Encounter (Signed)
Patient has Rx for lubiprostone 8 mcg, cost is $100 a bottle, wants to if there is a generic   [phone]

## 2021-12-24 NOTE — Telephone Encounter (Signed)
Unfortunately lubiprostone is the generic (brand neame is Amitiza). She can try again Linzess every other day or take Miralax 3 capfuls every day.

## 2021-12-24 NOTE — Telephone Encounter (Signed)
Tried to call no answer

## 2021-12-24 NOTE — Addendum Note (Signed)
Addended by: Robet Leu A on: 12/24/2021 12:38 PM   Modules accepted: Orders

## 2021-12-24 NOTE — Progress Notes (Signed)
Patient called today and stated she never got her refills from her last appointment. I informed patient that I would send in the medications today.

## 2021-12-24 NOTE — Telephone Encounter (Signed)
Seen 07/31/21 and note states she did not tolerate linzess and was started on lubiprostone. Med is now $100.

## 2021-12-25 ENCOUNTER — Telehealth: Payer: Self-pay | Admitting: Family Medicine

## 2021-12-25 ENCOUNTER — Telehealth (INDEPENDENT_AMBULATORY_CARE_PROVIDER_SITE_OTHER): Payer: Self-pay | Admitting: Gastroenterology

## 2021-12-25 ENCOUNTER — Other Ambulatory Visit (INDEPENDENT_AMBULATORY_CARE_PROVIDER_SITE_OTHER): Payer: Self-pay | Admitting: Gastroenterology

## 2021-12-25 DIAGNOSIS — K581 Irritable bowel syndrome with constipation: Secondary | ICD-10-CM

## 2021-12-25 DIAGNOSIS — I10 Essential (primary) hypertension: Secondary | ICD-10-CM

## 2021-12-25 MED ORDER — LINACLOTIDE 72 MCG PO CAPS: 72.0000 ug | ORAL_CAPSULE | Freq: Every day | ORAL | 3 refills | Status: DC

## 2021-12-25 MED ORDER — LISINOPRIL 20 MG PO TABS: ORAL_TABLET | ORAL | 1 refills | Status: DC

## 2021-12-25 NOTE — Telephone Encounter (Signed)
Medication sent to pharmacy  

## 2021-12-25 NOTE — Telephone Encounter (Signed)
Patient informed that medication sent to pharmacy for 90 days. Verbalized understanding

## 2021-12-25 NOTE — Telephone Encounter (Signed)
Pt called and said Dr Lacinda Axon only filled her Lisinopril for 30 days and requested that it be filled for 90 days. [facility].  [phone]

## 2021-12-25 NOTE — Telephone Encounter (Signed)
Pt states pharm gave her protonix instead of linzess. I told her linzess was sent in today to [facility] at 3:04pm and she states she went to pharm before that and will check back with pharm.

## 2021-12-25 NOTE — Telephone Encounter (Signed)
Patient would like to try the linzess and would like it sent to [facility]. Thanks

## 2021-12-25 NOTE — Telephone Encounter (Signed)
Patient left voice mail message stating the wrong prescription was sent in - please advise - ph# [phone]

## 2021-12-26 NOTE — Telephone Encounter (Signed)
Tried calling patient to notify her it has been sent in to the pharmacy, no answer.

## 2022-01-05 ENCOUNTER — Other Ambulatory Visit: Payer: Self-pay

## 2022-01-05 ENCOUNTER — Emergency Department (HOSPITAL_COMMUNITY)
Admission: EM | Admit: 2022-01-05 | Discharge: 2022-01-05 | Disposition: A | Discharge status: Home / Self Care | Payer: PPO | Attending: Emergency Medicine | Admitting: Emergency Medicine

## 2022-01-05 ENCOUNTER — Emergency Department (HOSPITAL_COMMUNITY): Payer: PPO

## 2022-01-05 ENCOUNTER — Encounter (HOSPITAL_COMMUNITY): Payer: Self-pay | Admitting: *Deleted

## 2022-01-05 DIAGNOSIS — I10 Essential (primary) hypertension: Secondary | ICD-10-CM | POA: Insufficient documentation

## 2022-01-05 DIAGNOSIS — Z79899 Other long term (current) drug therapy: Secondary | ICD-10-CM | POA: Diagnosis not present

## 2022-01-05 DIAGNOSIS — R110 Nausea: Secondary | ICD-10-CM | POA: Insufficient documentation

## 2022-01-05 DIAGNOSIS — R0602 Shortness of breath: Secondary | ICD-10-CM | POA: Diagnosis not present

## 2022-01-05 DIAGNOSIS — R079 Chest pain, unspecified: Secondary | ICD-10-CM

## 2022-01-05 DIAGNOSIS — R0789 Other chest pain: Secondary | ICD-10-CM | POA: Diagnosis not present

## 2022-01-05 LAB — CBC
HCT: 46.3 % — ABNORMAL HIGH (ref 36.0–46.0)
Hemoglobin: 15 g/dL (ref 12.0–15.0)
MCH: 32.3 pg (ref 26.0–34.0)
MCHC: 32.4 g/dL (ref 30.0–36.0)
MCV: 99.8 fL (ref 80.0–100.0)
Platelets: 192 10*3/uL (ref 150–400)
RBC: 4.64 MIL/uL (ref 3.87–5.11)
RDW: 13.3 % (ref 11.5–15.5)
WBC: 9 10*3/uL (ref 4.0–10.5)
nRBC: 0 % (ref 0.0–0.2)

## 2022-01-05 LAB — BASIC METABOLIC PANEL
Anion gap: 7 (ref 5–15)
BUN: 25 mg/dL — ABNORMAL HIGH (ref 8–23)
CO2: 22 mmol/L (ref 22–32)
Calcium: 9.1 mg/dL (ref 8.9–10.3)
Chloride: 107 mmol/L (ref 98–111)
Creatinine, Ser: 0.75 mg/dL (ref 0.44–1.00)
GFR, Estimated: >60 mL/min (ref >60)
Glucose, Bld: 125 mg/dL — ABNORMAL HIGH (ref 70–99)
Potassium: 3.6 mmol/L (ref 3.5–5.1)
Sodium: 136 mmol/L (ref 135–145)

## 2022-01-05 LAB — TROPONIN I (HIGH SENSITIVITY)
Troponin I (High Sensitivity): 2 ng/L (ref <18)
Troponin I (High Sensitivity): 2 ng/L (ref <18)

## 2022-01-05 LAB — D-DIMER, QUANTITATIVE: D-Dimer, Quant: <0.27 ug/mL-FEU (ref 0.00–0.50)

## 2022-01-05 NOTE — ED Provider Notes (Signed)
[facility] Provider Note   CSN: [id number] Arrival date & time: 01/05/22  1339     History Chief Complaint  Patient presents with   Chest Pain    Audrey DoughtyCathy H [name] is a 66 y.o. female with history of asthma, hypertension, hypothyroidism, and hyperlipidemia presents to the emergency department for evaluation of nonradiating chest pressure/tightness that is occurred 4-5 times in the past 3 weeks.  She reports the pain usually last between 10 to 15 minutes as it is relieved with an 81 mg aspirin.  She reports the pain is throughout her chest, on the right, left, and [name].  She reports some occasional shortness of breath and has had some nausea for the past few episodes.  Last episode today at 1215 that she reported lasted a little longer than 15 minutes but was relieved again by the 81 mg of aspirin.  Denies any history of MI or stroke.  Medical history as listed above.   Chest Pain Associated symptoms: nausea and shortness of breath   Associated symptoms: no diaphoresis, no dizziness and no weakness    HPI: A 66 year old patient with a history of hypertension, hypercholesterolemia and obesity presents for evaluation of chest pain. Initial onset of pain was approximately 3-6 hours ago. The patient's chest pain is described as heaviness/pressure/tightness and is worse with exertion. The patient complains of nausea and reports some diaphoresis. The patient's chest pain is not middle- or left-sided, is not well-localized, is not sharp and does not radiate to the arms/jaw/neck. The patient has no history of stroke, has no history of peripheral artery disease, has not smoked in the past 90 days, denies any history of treated diabetes and has no relevant family history of coronary artery disease (first degree relative at less than age 66).   Home Medications Prior to Admission medications   Medication Sig Start Date End Date Taking? Authorizing Provider  albuterol (PROVENTIL)  (2.5 MG/3ML) 0.083% nebulizer solution Take 3 mLs (2.5 mg total) by nebulization every 4 (four) hours as needed for wheezing or shortness of breath (coughing fits). 12/24/21  Yes Ambs, Norvel RichardsAnne M, FNP  albuterol (VENTOLIN HFA) 108 (90 Base) MCG/ACT inhaler Inhale 1-2 puffs into the lungs every 6 (six) hours as needed for wheezing or shortness of breath. 03/11/20  Yes Avegno, Zachery DakinsKomlanvi S, FNP  Budeson-Glycopyrrol-Formoterol (BREZTRI AEROSPHERE) 160-9-4.8 MCG/ACT AERO Inhale 2 puffs into the lungs in the morning and at bedtime. 12/24/21  Yes Ambs, Norvel RichardsAnne M, FNP  budesonide (PULMICORT) 0.5 MG/2ML nebulizer solution Take 2 mLs (0.5 mg total) by nebulization in the morning and at bedtime. 12/24/21  Yes Ambs, Norvel RichardsAnne M, FNP  fluticasone (FLONASE) 50 MCG/ACT nasal spray Place 2 sprays into both nostrils daily. 12/24/21  Yes Ambs, Norvel RichardsAnne M, FNP  gabapentin (NEURONTIN) 100 MG capsule Take 100 mg by mouth at bedtime as needed (back pain).   Yes [provider]  ipratropium-albuterol (DUONEB) 0.5-2.5 (3) MG/3ML SOLN Take 3 mLs by nebulization every 4 (four) hours as needed. 12/15/21 01/14/22 Yes Alfonse SpruceGallagher, Joel Louis, MD  levothyroxine (SYNTHROID) 137 MCG tablet TAKE (1) TABLET BY MOUTH ONCE DAILY BEFORE BREAKFAST. 09/18/21  Yes Cook, Verdis FredericksonJayce G, DO  linaclotide (LINZESS) 72 MCG capsule Take 1 capsule (72 mcg total) by mouth daily before breakfast. 12/25/21  Yes Marguerita Merlesastaneda Mayorga, Daniel, MD  lisinopril (ZESTRIL) 20 MG tablet TAKE (1) TABLET BY MOUTH ONCE DAILY. 12/25/21  Yes Cook, Jayce G, DO  pantoprazole (PROTONIX) 40 MG tablet Take 1 tablet (40 mg total)  by mouth daily. 12/15/21  Yes Ambs, Norvel Richards, FNP  polyethylene glycol powder (GLYCOLAX/MIRALAX) 17 GM/SCOOP powder Take 17 g by mouth daily. Patient taking differently: Take 17 g by mouth daily. As needed per patient. 06/03/20  Yes Rehman, Joline Maxcy, MD  rosuvastatin (CRESTOR) 5 MG tablet Take 1 tablet (5 mg total) by mouth daily. 09/18/21  Yes Cook, Jayce G, DO  saline (AYR) GEL  Place 1 application into both nostrils daily as needed (dryness).   Yes [provider]  sertraline (ZOLOFT) 100 MG tablet TAKE 1 AND 1/2 TABLETS BY MOUTH ONCE DAILY. 12/10/21  Yes Cook, Jayce G, DO  Sodium Fluoride (PREVIDENT 5000 BOOSTER) 1.1 % PSTE Place 1 application onto teeth at bedtime. 10/09/20  Yes Taylor, Malena M, DO  montelukast (SINGULAIR) 10 MG tablet Take 1 tablet (10 mg total) by mouth at bedtime. Patient not taking: Reported on 01/05/2022 12/24/21   Hetty Blend, FNP  predniSONE (DELTASONE) 10 MG tablet Take 3 tabs (30mg ) twice daily for 3 days, then 2 tabs (20mg ) twice daily for 3 days, then 1 tab (10mg ) twice daily for 3 days, then STOP. Patient not taking: Reported on 01/05/2022 12/12/21   , MD      Allergies    Penicillins    Review of Systems   Review of Systems  Constitutional:  Negative for diaphoresis.  Respiratory:  Positive for shortness of breath.   Cardiovascular:  Positive for chest pain.  Gastrointestinal:  Positive for nausea.  Neurological:  Negative for dizziness, weakness and light-headedness.  All other systems reviewed and are negative.  Physical Exam Updated Vital Signs BP 114/86    Pulse (!) 105    Temp 97.8 F (36.6 C) (Oral)    Resp 19    Ht 5\' 2"  (1.575 m)    Wt 78.7 kg    SpO2 96%    BMI 31.75 kg/m  Physical Exam Vitals and nursing note reviewed.  Constitutional:      General: She is not in acute distress.    Appearance: Normal appearance. She is not toxic-appearing.  HENT:     Head: Normocephalic and atraumatic.  Eyes:     General: No scleral icterus. Cardiovascular:     Rate and Rhythm: Normal rate and regular rhythm.     Pulses:          Radial pulses are 2+ on the right side and 2+ on the left side.       Dorsalis pedis pulses are 2+ on the right side and 2+ on the left side.       Posterior tibial pulses are 2+ on the right side and 2+ on the left side.     Heart sounds: Normal heart sounds. No murmur  heard. Pulmonary:     Effort: Pulmonary effort is normal. No respiratory distress.     Breath sounds: Normal breath sounds.     Comments: Clear to auscultation bilaterally.  No respiratory distress, accessory muscle use, tripoding, nasal flaring, or cyanosis present.  Patient satting 100% on room air.  Speaking in full sentences with ease. Chest:     Chest wall: No deformity, tenderness or crepitus.  Abdominal:     General: Abdomen is flat. Bowel sounds are normal.     Palpations: Abdomen is soft.     Tenderness: There is no abdominal tenderness. There is no guarding or rebound.  Musculoskeletal:        General: No deformity.  Cervical back: Normal range of motion.     Right lower leg: No edema.     Left lower leg: No edema.  Skin:    General: Skin is warm and dry.  Neurological:     General: No focal deficit present.     Mental Status: She is alert. Mental status is at baseline.    ED Results / Procedures / Treatments   Labs (all labs ordered are listed, but only abnormal results are displayed) Labs Reviewed  BASIC METABOLIC PANEL - Abnormal; Notable for the following components:      Result Value   Glucose, Bld 125 (*)    BUN 25 (*)    All other components within normal limits  CBC - Abnormal; Notable for the following components:   HCT 46.3 (*)    All other components within normal limits  D-DIMER, QUANTITATIVE  TROPONIN I (HIGH SENSITIVITY)  TROPONIN I (HIGH SENSITIVITY)    EKG EKG Interpretation  Date/Time:  Monday January 05 2022 13:50:41 EST Ventricular Rate:  97 PR Interval:  166 QRS Duration: 80 QT Interval:  364 QTC Calculation: 462 R Axis:   2 Text Interpretation: Normal sinus rhythm Possible Inferior infarct , age undetermined Anterior infarct , age undetermined Abnormal ECG When compared with ECG of 15-Jun-2012 13:18, Anterior infarct is now Present No significant change was found Confirmed by Gloris Manchesterixon, Ryan [phone]([id number]) on 01/05/2022 2:06:24 PM  Radiology DG  Chest 2 View  Result Date: 01/05/2022 CLINICAL DATA:  Chest pain EXAM: CHEST - 2 VIEW COMPARISON:  12/12/2021 FINDINGS: The heart size and mediastinal contours are within normal limits. Both lungs are clear. No pleural effusion or pneumothorax. The visualized skeletal structures are unremarkable. IMPRESSION: No acute process in the chest. Electronically Signed   By: Guadlupe SpanishPraneil  Patel M.D.   On: 01/05/2022 14:08    Procedures Procedures   Medications Ordered in ED Medications - No data to display  ED Course/ Medical Decision Making/ A&P   HEAR Score: 6                       Medical Decision Making Amount and/or Complexity of Data Reviewed Labs: ordered. Radiology: ordered.  66 year old female presents emergency department with episodic chest pain/pressure for the past 3 weeks.  Differential diagnosis includes but is not limited to pneumonia, pneumothorax, asthma exacerbation, dissection, ACS, PE, costochondritis.  Low suspicion for dissection given the duration of symptoms and patient presentation.  Pulses intact throughout.  Low suspicion for PE given the duration of symptoms and the lack of pleuritic chest pain although will collect D-dimer as the patient cannot be PERC negative.  I independently reviewed and interpreted the patient's labs and imaging.  CBC shows no signs of anemia or leukocytosis.  BMP shows mildly elevated glucose at 125 mildly elevated BUN at 25, however no electrolyte abnormalities.  D-dimer undetectable at less than 0.27.  Troponin initially at 2 with a repeat of 2, delta 0.  X-ray shows no acute cardiopulmonary process of the chest.  No signs of pneumothorax or pneumonia.  EKG shows new anterior infarct in comparison to previous EKG from 10 years ago.  Given the low D-dimer, PE low suspicion.  Given a flat troponins, will decision for ACS.  Nontender chest wall and nonreproducible chest pain, low likelihood of costochondritis.  Although the patient has clear lung sounds to  auscultation, I suspect this may be her asthma.  Nonetheless, will page cardiology given the patient's heart score of  6.  Consults: Spoke with Dr. Anne Fu with cardiology who recommended outpatient follow up with cardiology based on reassuring troponins and EKG.   I discussed the results and cardiology consult with the patient.  My attending also discussed with the patient at bedside.  The cardiologist recommended outpatient follow-up.  I attached the cardiologist information to the discharge report and recommended the patient call as they would likely schedule her for outpatient stress testing and/or echo.  Strict return precautions were discussed with her.  Patient agrees to plan.  Patient is stable being discharged home in good condition.  I discussed this case with my attending physician who cosigned this note including patient's presenting symptoms, physical exam, and planned diagnostics and interventions. Attending physician stated agreement with plan or made changes to plan which were implemented.   Attending physician assessed patient at bedside.  Final Clinical Impression(s) / ED Diagnoses Final diagnoses:  Chest pain, unspecified type    Rx / DC Orders ED Discharge Orders     None         Achille Rich, PA-C 01/07/22 1607    Gloris Manchester, MD 01/09/22 [phone]

## 2022-01-05 NOTE — Discharge Instructions (Signed)
You were seen here today for evaluation of your chest pain for the past three weeks. Your work up here was reassuring, but given you history and some characteristics of you chest pain, the cardiologist, Dr. Marlou Porch would like for you to follow up with him outpatient. I have attached his information to this discharge summary. Please call to schedule and appointment with him ASAP. If you have any worsening chest pain, shortness of breath, lightheadedness, sweating, or nausea, return to the ER for re-evaluation.

## 2022-01-05 NOTE — ED Triage Notes (Signed)
Pt with mid CP off and on 4-5 times over the past 3 weeks.  +nausea.  Chronic SOB per pt.

## 2022-01-05 NOTE — ED Notes (Signed)
Pt just ambulated to bathroom no apparent distress. A/o. Denies pain at this time.

## 2022-01-08 ENCOUNTER — Other Ambulatory Visit: Payer: Self-pay

## 2022-01-08 ENCOUNTER — Encounter: Payer: Self-pay | Admitting: Family Medicine

## 2022-01-08 ENCOUNTER — Ambulatory Visit (INDEPENDENT_AMBULATORY_CARE_PROVIDER_SITE_OTHER): Payer: PPO | Admitting: Family Medicine

## 2022-01-08 VITALS — BP 127/81 | HR 81 | Temp 97.8°F | Ht 62.0 in | Wt 174.0 lb

## 2022-01-08 DIAGNOSIS — M25511 Pain in right shoulder: Secondary | ICD-10-CM

## 2022-01-08 DIAGNOSIS — G8929 Other chronic pain: Secondary | ICD-10-CM

## 2022-01-08 DIAGNOSIS — R0789 Other chest pain: Secondary | ICD-10-CM | POA: Insufficient documentation

## 2022-01-08 NOTE — Assessment & Plan Note (Signed)
Shoulder pain persists.  Arranging MRI.

## 2022-01-08 NOTE — Assessment & Plan Note (Signed)
Negative work-up in the ER.  Now resolved.  Doing well at this time.

## 2022-01-08 NOTE — Patient Instructions (Addendum)
I have ordered the MRI.  You should receive a phone call about scheduling.  Follow up in 6 months or sooner if needed.

## 2022-01-08 NOTE — Progress Notes (Addendum)
Subjective:  Patient ID: [name], female    DOB: Jan 07, 1956  Age: 66 y.o. MRN: [id number]  CC: Chief Complaint  Patient presents with   ER follow up chest pain , R arm pain     HPI:  66 year old female presents for ER follow-up.  Also complains of persistent right shoulder pain.  Patient recently seen in the ER for chest pain.  Work-up was unremarkable.  Discharged home in stable condition.  Chest pain has resolved.  She is doing well from this perspective.  This was thought to be secondary to her asthma.  Nevertheless, not cardiac in origin.  Additionally, patient reports continued right shoulder pain.  She has seen me for this issue in October and has also seen an orthopedist in [place].  She states that it was recommended that she have an MRI but this has not been done.  She states that she has not gotten this done due to being "sick".  Upon review of that chart it shows she has had issues with asthma exacerbations recently.  Patient reports continued right shoulder pain and decreased range of motion.  She states that the shoulder feels weak.  Patient Active Problem List   Diagnosis Date Noted   Atypical chest pain 01/08/2022   Seasonal and perennial allergic rhinitis 12/04/2021   Right shoulder pain 09/18/2021   Healthcare maintenance 09/18/2021   Panic attack 09/05/2021   IBS (irritable bowel syndrome) 07/31/2021   Moderate persistent asthma with acute exacerbation 01/20/2021   Prediabetes 12/13/2017   Hyperlipidemia 12/04/2016   Rosacea 03/07/2014   Chronic low back pain 10/22/2013   Anxiety 08/23/2013   Depression 05/15/2013   Urinary incontinence in female 02/23/2013   Gastroesophageal reflux disease 09/19/2012   Intestinal bacterial overgrowth 07/25/2012   Essential hypertension 07/25/2012   Hypothyroidism 07/25/2012   CTS (carpal tunnel syndrome) 04/21/2012    Social Hx   Social History   Socioeconomic History   Marital status: Married    Spouse  name: Not on file   Number of children: Not on file   Years of education: 12   Highest education level: Not on file  Occupational History   Not on file  Tobacco Use   Smoking status: Former    Packs/day: 1.50    Years: 27.00    Pack years: 40.50    Types: Cigarettes    Start date: 1973    Quit date: 2000    Years since quitting: 23.1   Smokeless tobacco: Never  Vaping Use   Vaping Use: Never used  Substance and Sexual Activity   Alcohol use: No   Drug use: No   Sexual activity: Not on file  Other Topics Concern   Not on file  Social History Narrative   Not on file   Social Determinants of Health   Financial Resource Strain: Not on file  Food Insecurity: Not on file  Transportation Needs: Not on file  Physical Activity: Not on file  Stress: Not on file  Social Connections: Not on file    Review of Systems Per HPI  Objective:  BP 127/81    Pulse 81    Temp 97.8 F (36.6 C)    Ht 5\' 2"  (1.575 m)    Wt 174 lb (78.9 kg)    SpO2 97%    BMI 31.83 kg/m   BP/Weight 01/08/2022 01/05/2022 12/15/2021  Systolic BP 127 132 138  Diastolic BP 81 112 90  Wt. (Lbs) 174 173.6 171.2  BMI 31.83 31.75 31.31    Physical Exam Vitals and nursing note reviewed.  Constitutional:      General: She is not in acute distress.    Appearance: Normal appearance.  HENT:     Head: Normocephalic and atraumatic.  Cardiovascular:     Rate and Rhythm: Normal rate and regular rhythm.     Heart sounds: No murmur heard. Pulmonary:     Effort: Pulmonary effort is normal.     Breath sounds: Normal breath sounds. No wheezing or rales.  Neurological:     Mental Status: She is alert.  Psychiatric:        Mood and Affect: Mood normal.        Behavior: Behavior normal.    Lab Results  Component Value Date   WBC 9.0 01/05/2022   HGB 15.0 01/05/2022   HCT 46.3 (H) 01/05/2022   PLT 192 01/05/2022   GLUCOSE 125 (H) 01/05/2022   CHOL 189 06/16/2021   TRIG 129 06/16/2021   HDL 58 06/16/2021    LDLCALC 108 (H) 06/16/2021   ALT 24 06/16/2021   AST 19 06/16/2021   NA 136 01/05/2022   K 3.6 01/05/2022   CL 107 01/05/2022   CREATININE 0.75 01/05/2022   BUN 25 (H) 01/05/2022   CO2 22 01/05/2022   TSH 1.740 08/29/2021   INR 0.9 10/21/2007   HGBA1C 5.8 (H) 06/16/2021     Assessment & Plan:   Problem List Items Addressed This Visit       Other   Right shoulder pain - Primary    Shoulder pain persists.  Arranging MRI.      Relevant Orders   MR Shoulder Right Wo Contrast   Atypical chest pain    Negative work-up in the ER.  Now resolved.  Doing well at this time.       Follow-up:  Return in about 6 months (around 07/08/2022).  Everlene Other DO [facility]

## 2022-01-09 ENCOUNTER — Telehealth: Payer: Self-pay | Admitting: Internal Medicine

## 2022-01-09 ENCOUNTER — Encounter: Payer: Self-pay | Admitting: Internal Medicine

## 2022-01-09 ENCOUNTER — Other Ambulatory Visit (HOSPITAL_COMMUNITY)
Admission: RE | Admit: 2022-01-09 | Discharge: 2022-01-09 | Disposition: A | Discharge status: Home / Self Care | Payer: PPO | Source: Ambulatory Visit | Attending: Internal Medicine | Admitting: Internal Medicine

## 2022-01-09 ENCOUNTER — Ambulatory Visit: Payer: PPO | Admitting: Internal Medicine

## 2022-01-09 VITALS — BP 110/70 | HR 96 | Ht 62.0 in | Wt 179.6 lb

## 2022-01-09 DIAGNOSIS — R079 Chest pain, unspecified: Secondary | ICD-10-CM | POA: Diagnosis not present

## 2022-01-09 DIAGNOSIS — E7849 Other hyperlipidemia: Secondary | ICD-10-CM

## 2022-01-09 DIAGNOSIS — R0602 Shortness of breath: Secondary | ICD-10-CM | POA: Insufficient documentation

## 2022-01-09 LAB — BASIC METABOLIC PANEL
Anion gap: 8 (ref 5–15)
BUN: 18 mg/dL (ref 8–23)
CO2: 24 mmol/L (ref 22–32)
Calcium: 9.1 mg/dL (ref 8.9–10.3)
Chloride: 106 mmol/L (ref 98–111)
Creatinine, Ser: 0.82 mg/dL (ref 0.44–1.00)
GFR, Estimated: >60 mL/min (ref >60)
Glucose, Bld: 95 mg/dL (ref 70–99)
Potassium: 4.2 mmol/L (ref 3.5–5.1)
Sodium: 138 mmol/L (ref 135–145)

## 2022-01-09 LAB — URIC ACID: Uric Acid, Serum: 4.4 mg/dL (ref 2.5–7.1)

## 2022-01-09 MED ORDER — LOSARTAN POTASSIUM 100 MG PO TABS: 100.0000 mg | ORAL_TABLET | Freq: Every day | ORAL | 3 refills | Status: DC

## 2022-01-09 MED ORDER — METOPROLOL TARTRATE 100 MG PO TABS: 100.0000 mg | ORAL_TABLET | ORAL | 0 refills | Status: DC

## 2022-01-09 NOTE — Telephone Encounter (Signed)
I told patient that we switched her from lisinopril to losartan

## 2022-01-09 NOTE — Telephone Encounter (Signed)
° °  Pt c/o medication issue:  1. Name of Medication: losartan (COZAAR) 100 MG tablet  2. How are you currently taking this medication (dosage and times per day)? Take 1 tablet (100 mg total) by mouth daily.  3. Are you having a reaction (difficulty breathing--STAT)?   4. What is your medication issue? Pt is calling to ask Dr. Tenny Craw if this is something she needs to take from now on (for a long time)

## 2022-01-09 NOTE — Progress Notes (Signed)
Cardiology Office Note   Date:  01/09/2022   ID:  [name] 1956-08-08, MRN [id number]  PCP:  [name], DO  Cardiologist:   Dietrich Pates, MD   Pt referred for evaluation of CP  History of Present Illness: [name] is a 66 y.o. female who is referred for CP   The pt says on Christmas Eve she started feeling really bad. Stated coughing uncontrollably She says it was like her chest was closing in   Could hear herself breathing     Took 3 doses of steroids and ABX  Seen by Pulm in [place]  Felt she was having an asthma flare  Rx nebulizer.    The pt says she has improved but that  she still feels weak, does not feel "together "       This week while she was folding  clothes she got tight in her chest nd back   Doubled over  in discomfort   No wheezing   Went to ED     Diet:  Breakfast:   Bacon/eggs or sausage/eggs  / hashbrown Lunch  Skips Dinner:   4 30  Restaurant Sprite  3 at most Coffee with sugar     Current Meds  Medication Sig   albuterol (PROVENTIL) (2.5 MG/3ML) 0.083% nebulizer solution Take 3 mLs (2.5 mg total) by nebulization every 4 (four) hours as needed for wheezing or shortness of breath (coughing fits).   albuterol (VENTOLIN HFA) 108 (90 Base) MCG/ACT inhaler Inhale 1-2 puffs into the lungs every 6 (six) hours as needed for wheezing or shortness of breath.   Budeson-Glycopyrrol-Formoterol (BREZTRI AEROSPHERE) 160-9-4.8 MCG/ACT AERO Inhale 2 puffs into the lungs in the morning and at bedtime.   budesonide (PULMICORT) 0.5 MG/2ML nebulizer solution Take 2 mLs (0.5 mg total) by nebulization in the morning and at bedtime.   fluticasone (FLONASE) 50 MCG/ACT nasal spray Place 2 sprays into both nostrils daily.   gabapentin (NEURONTIN) 100 MG capsule Take 100 mg by mouth at bedtime as needed (back pain).   ipratropium-albuterol (DUONEB) 0.5-2.5 (3) MG/3ML SOLN Take 3 mLs by nebulization every 4 (four) hours as needed.   levothyroxine (SYNTHROID) 137  MCG tablet TAKE (1) TABLET BY MOUTH ONCE DAILY BEFORE BREAKFAST.   linaclotide (LINZESS) 72 MCG capsule Take 1 capsule (72 mcg total) by mouth daily before breakfast.   lisinopril (ZESTRIL) 20 MG tablet TAKE (1) TABLET BY MOUTH ONCE DAILY.   montelukast (SINGULAIR) 10 MG tablet Take 1 tablet (10 mg total) by mouth at bedtime.   pantoprazole (PROTONIX) 40 MG tablet Take 1 tablet (40 mg total) by mouth daily.   polyethylene glycol powder (GLYCOLAX/MIRALAX) 17 GM/SCOOP powder Take 17 g by mouth daily.   rosuvastatin (CRESTOR) 5 MG tablet Take 1 tablet (5 mg total) by mouth daily.   saline (AYR) GEL Place 1 application into both nostrils daily as needed (dryness).   sertraline (ZOLOFT) 100 MG tablet TAKE 1 AND 1/2 TABLETS BY MOUTH ONCE DAILY.   Sodium Fluoride (PREVIDENT 5000 BOOSTER) 1.1 % PSTE Place 1 application onto teeth at bedtime.     Allergies:   Penicillins   Past Medical History:  Diagnosis Date   Anxiety    Arthritis    Depression    GERD (gastroesophageal reflux disease)    Hypertension    Hypothyroid    Hypothyroidism    Moderate persistent asthma 01/20/2021   PONV (postoperative nausea and vomiting)    Recurrent upper  respiratory infection (URI)    Urticaria     Past Surgical History:  Procedure Laterality Date   BACK SURGERY     BIOPSY  06/03/2020   Procedure: BIOPSY;  Surgeon: Malissa Hippo, MD;  [facility];  Service: Endoscopy;;   CARPAL TUNNEL RELEASE  06/16/2012   Procedure: CARPAL TUNNEL RELEASE;  Surgeon: Dominica Severin, MD;  Location: [facility];  Service: Orthopedics;  Laterality: Right;  limited open carpal tunnel release   CERVICAL FUSION  2018   CHOLECYSTECTOMY     COLONOSCOPY     COLONOSCOPY WITH PROPOFOL N/A 06/03/2020   Procedure: COLONOSCOPY WITH PROPOFOL;  Surgeon: Malissa Hippo, MD;  [facility];  Service: Endoscopy;  Laterality: N/A;  115   ESOPHAGOGASTRODUODENOSCOPY (EGD) WITH PROPOFOL N/A 06/03/2020    Procedure: ESOPHAGOGASTRODUODENOSCOPY (EGD) WITH PROPOFOL;  Surgeon: Malissa Hippo, MD;  [facility];  Service: Endoscopy;  Laterality: N/A;   TONSILLECTOMY     TRIGGER FINGER RELEASE  06/16/2012   Procedure: RELEASE TRIGGER FINGER/A-1 PULLEY;  Surgeon: Dominica Severin, MD;  Location: [facility];  Service: Orthopedics;  Laterality: Right;  right middle a-1 release   TUBAL LIGATION     UPPER GASTROINTESTINAL ENDOSCOPY     VAGINAL HYSTERECTOMY       Social History:  The patient  reports that she quit smoking about 23 years ago. Her smoking use included cigarettes. She started smoking about 50 years ago. She has a 40.50 pack-year smoking history. She has never used smokeless tobacco. She reports that she does not drink alcohol and does not use drugs.   Family History:  The patient's family history includes Arthritis in an other family member; Asthma in an other family member; COPD in her father, mother, and sister; Cancer in an other family member; Emphysema in her father; Healthy in her daughter and son; Immunodeficiency in her mother; Lung disease in her father and another family member; Osteoporosis in her mother; Tuberculosis in her father.    ROS:  Please see the history of present illness. All other systems are reviewed and  Negative to the above problem except as noted.    PHYSICAL EXAM: VS:  BP 110/70    Pulse 96    Ht 5\' 2"  (1.575 m)    Wt 179 lb 9.6 oz (81.5 kg)    SpO2 95%    BMI 32.85 kg/m   GEN: Obese in no acute distress  HEENT: normal  Neck: no JVD, carotid bruits Cardiac: RRR; no murmurs  No LE edema  Respiratory:  clear to auscultation bilaterally  Mild upper airway wheeze GI: soft, nontender, nondistended, + BS  No hepatomegaly  MS: no deformity Moving all extremities   Skin: warm and dry, no rash Neuro:  Strength and sensation are intact Psych: euthymic mood, full affect   EKG:  EKG is ordered today.  On 1/30 SR     Lipid Panel     Component Value Date/Time   CHOL 189 06/16/2021 0914   TRIG 129 06/16/2021 0914   HDL 58 06/16/2021 0914   CHOLHDL 3.3 06/16/2021 0914   CHOLHDL 4.0 09/22/2014 0942   VLDL 14 09/22/2014 0942   LDLCALC 108 (H) 06/16/2021 0914      Wt Readings from Last 3 Encounters:  01/09/22 179 lb 9.6 oz (81.5 kg)  01/08/22 174 lb (78.9 kg)  01/05/22 173 lb 9.6 oz (78.7 kg)      ASSESSMENT AND PLAN:  1  Chest discomfort   Pt had recent respiratory problem which has improved, though not completely   She had a spell that was different in nature   Signficant     She does have atherosclerosis of aorta in 2020 (CT)    I would recomm an echo to evaluate LV systolic / diastolic function    I would also reocmm a CT coronary angiogram to r/o signifant CAD   2  Dyspnea.   Recent respiratory exacerbation   On exam she still has upper airway wheezing    REview of meds, her lisinopril dose was increased a few weeks before Xmas.    ? If this is causing upper airway instability I would recomm switching to losartan 100 and stopping lisinopril  3  Lipids   Pt on Crestor   Will check lipmed, Lpa, ApoB and uric acid   Current medicines are reviewed at length with the patient today.  The patient does not have concerns regarding medicines.  Signed, Dietrich PatesPaula Chriselda Leppert, MD  01/09/2022 10:16 AM    [facility] [address], [place], [place]  [address] Phone: [phone]; Fax: [phone]

## 2022-01-09 NOTE — Patient Instructions (Addendum)
Medication Instructions:   Stop Taking Lisinopril  Start taking Losartan 100 mg Dalily  On the day of your CT scan do not take Losartan and Take Lopressor 100 mg two hours prior to CT Scan.   *If you need a refill on your cardiac medications before your next appointment, please call your pharmacy*   Lab Work: Your physician recommends that you return for lab work in: Today   If you have labs (blood work) drawn today and your tests are completely normal, you will receive your results only by: MyChart Message (if you have MyChart) OR A paper copy in the mail If you have any lab test that is abnormal or we need to change your treatment, we will call you to review the results.   Testing/Procedures: Non-Cardiac CT Angiography (CTA), is a special type of CT scan that uses a computer to produce multi-dimensional views of major blood vessels throughout the body. In CT angiography, a contrast material is injected through an IV to help visualize the blood vessels  Your physician has requested that you have an echocardiogram. Echocardiography is a painless test that uses sound waves to create images of your heart. It provides your doctor with information about the size and shape of your heart and how well your hearts chambers and valves are working. This procedure takes approximately one hour. There are no restrictions for this procedure.    Follow-Up: At [facility], you and your health needs are our priority.  As part of our continuing mission to provide you with exceptional heart care, we have created designated Provider Care Teams.  These Care Teams include your primary Cardiologist (physician) and Advanced Practice Providers (APPs -  Physician Assistants and Nurse Practitioners) who all work together to provide you with the care you need, when you need it.  We recommend signing up for the patient portal called "MyChart".  Sign up information is provided on this After Visit Summary.  MyChart  is used to connect with patients for Virtual Visits (Telemedicine).  Patients are able to view lab/test results, encounter notes, upcoming appointments, etc.  Non-urgent messages can be sent to your provider as well.   To learn more about what you can do with MyChart, go to [email or web address].    Your next appointment:    Pending Test Results   The format for your next appointment:   In Person  Provider:   Dietrich Pates, MD    Other Instructions Thank you for choosing [facility]!    Your cardiac CT will be scheduled at one of the below locations:   [facility] [address] [place], [place] [address] [phone]  OR  [facility] [address] [place], [place] [address] [phone]  If scheduled at [facility], please arrive at the [facility] main entrance (entrance A[facility] 30 minutes prior to test start time. You can use the FREE valet parking offered at the main entrance (encouraged to control the heart rate for the test) Proceed to the [facility] Radiology Department (first floor) to check-in and test prep.  If scheduled at [facility], please arrive 15 mins early for check-in and test prep.  Please follow these instructions carefully (unless otherwise directed):  On the Night Before the Test: Be sure to Drink plenty of water. Do not consume any caffeinated/decaffeinated beverages or chocolate 12 hours prior to your test. Do not take any antihistamines 12  hours prior to your test. On the Day of the Test: Drink plenty of water until 1 hour prior to the test. Do not eat any food 4 hours prior to the test. You may take your regular medications prior to the test.  Take metoprolol (Lopressor) two hours prior to test. HOLD Furosemide/Hydrochlorothiazide morning of the test. FEMALES- please wear underwire-free bra if available, avoid dresses &  tight clothing   *For Clinical Staff only. Please instruct patient the following:* Heart Rate Medication Recommendations for Cardiac CT  Resting HR < 50 bpm  No medication  Resting HR 50-60 bpm and BP >110/50 mmHG   Consider Metoprolol tartrate 25 mg PO 90-120 min prior to scan  Resting HR 60-65 bpm and BP >110/50 mmHG  Metoprolol tartrate 50 mg PO 90-120 minutes prior to scan   Resting HR > 65 bpm and BP >110/50 mmHG  Metoprolol tartrate 100 mg PO 90-120 minutes prior to scan  Consider Ivabradine 10-15 mg PO or a calcium channel blocker for resting HR >60 bpm and contraindication to metoprolol tartrate  Consider Ivabradine 10-15 mg PO in combination with metoprolol tartrate for HR >80 bpm         After the Test: Drink plenty of water. After receiving IV contrast, you may experience a mild flushed feeling. This is normal. On occasion, you may experience a mild rash up to 24 hours after the test. This is not dangerous. If this occurs, you can take Benadryl 25 mg and increase your fluid intake. If you experience trouble breathing, this can be serious. If it is severe call 911 IMMEDIATELY. If it is mild, please call our office. If you take any of these medications: Glipizide/Metformin, Avandament, Glucavance, please do not take 48 hours after completing test unless otherwise instructed.  We will call to schedule your test 2-4 weeks out understanding that some insurance companies will need an authorization prior to the service being performed.   For non-scheduling related questions, please contact the cardiac imaging nurse navigator should you have any questions/concerns: Rockwell Alexandria, Cardiac Imaging Nurse Navigator Larey Brick, Cardiac Imaging Nurse Navigator  Heart and Vascular Services Direct Office Dial: [phone]   For scheduling needs, including cancellations and rescheduling, please call [place], [phone].

## 2022-01-10 LAB — MISC LABCORP TEST (SEND OUT)
Labcorp test code: 123810
Labcorp test code: 123544

## 2022-01-10 LAB — LIPOPROTEIN A (LPA): Lipoprotein (a): 233.7 nmol/L — ABNORMAL HIGH (ref <75.0)

## 2022-01-13 ENCOUNTER — Telehealth: Payer: Self-pay | Admitting: Internal Medicine

## 2022-01-13 DIAGNOSIS — E7849 Other hyperlipidemia: Secondary | ICD-10-CM

## 2022-01-13 MED ORDER — ROSUVASTATIN CALCIUM 20 MG PO TABS: 20.0000 mg | ORAL_TABLET | Freq: Every day | ORAL | 3 refills | Status: DC

## 2022-01-13 NOTE — Telephone Encounter (Signed)
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

## 2022-01-13 NOTE — Telephone Encounter (Signed)
Patient returning call for lab results. 

## 2022-01-13 NOTE — Telephone Encounter (Signed)
I spoke with patient and discussed lab results.   She agrees to increase Crestor to 20 mg daily and repeat Lp(a) in 8 weeks

## 2022-01-16 ENCOUNTER — Telehealth: Payer: Self-pay | Admitting: Family Medicine

## 2022-01-16 ENCOUNTER — Other Ambulatory Visit: Payer: Self-pay | Admitting: Family Medicine

## 2022-01-16 ENCOUNTER — Other Ambulatory Visit: Payer: Self-pay

## 2022-01-16 ENCOUNTER — Ambulatory Visit (HOSPITAL_COMMUNITY)
Admission: RE | Admit: 2022-01-16 | Discharge: 2022-01-16 | Disposition: A | Discharge status: Home / Self Care | Payer: PPO | Source: Ambulatory Visit | Attending: Family Medicine | Admitting: Family Medicine

## 2022-01-16 DIAGNOSIS — G8929 Other chronic pain: Secondary | ICD-10-CM | POA: Diagnosis not present

## 2022-01-16 DIAGNOSIS — M25511 Pain in right shoulder: Secondary | ICD-10-CM | POA: Diagnosis not present

## 2022-01-16 DIAGNOSIS — M75121 Complete rotator cuff tear or rupture of right shoulder, not specified as traumatic: Secondary | ICD-10-CM | POA: Diagnosis not present

## 2022-01-16 MED ORDER — TRAMADOL HCL 50 MG PO TABS: 50.0000 mg | ORAL_TABLET | Freq: Three times a day (TID) | ORAL | 0 refills | Status: AC | PRN

## 2022-01-16 NOTE — Telephone Encounter (Signed)
Patient notified

## 2022-01-16 NOTE — Telephone Encounter (Signed)
Audrey Sams, DO    Rx sent in for her pain. Awaiting MRI to be read.

## 2022-01-16 NOTE — Telephone Encounter (Signed)
Pt husband called and said pt was in pain and req medication. I told him she would have to be seen. He said she was just seen and refused to make appt. Please call as he requested.   [phone]

## 2022-01-19 ENCOUNTER — Telehealth (HOSPITAL_COMMUNITY): Payer: Self-pay | Admitting: *Deleted

## 2022-01-19 NOTE — Telephone Encounter (Signed)
Reaching out to patient to offer assistance regarding upcoming cardiac imaging study; pt verbalizes understanding of appt date/time, parking situation and where to check in, pre-test NPO status and medications ordered, and verified current allergies; name and call back number provided for further questions should they arise ? ?Cleavon Goldman RN Navigator Cardiac Imaging ?[facility] ?[phone] office ?[phone] cell ? ?Patient to take 100mg metoprolol tartrate two hours prior to her cardiac CT scan. She is aware to arrive at 11am for her 11:30am scan. ?

## 2022-01-21 ENCOUNTER — Other Ambulatory Visit: Payer: Self-pay | Admitting: Family Medicine

## 2022-01-21 ENCOUNTER — Ambulatory Visit (HOSPITAL_COMMUNITY)
Admission: RE | Admit: 2022-01-21 | Discharge: 2022-01-21 | Disposition: A | Discharge status: Home / Self Care | Payer: PPO | Source: Ambulatory Visit | Attending: Internal Medicine | Admitting: Internal Medicine

## 2022-01-21 ENCOUNTER — Other Ambulatory Visit: Payer: Self-pay

## 2022-01-21 DIAGNOSIS — Q2112 Patent foramen ovale: Secondary | ICD-10-CM | POA: Diagnosis not present

## 2022-01-21 DIAGNOSIS — I2510 Atherosclerotic heart disease of native coronary artery without angina pectoris: Secondary | ICD-10-CM | POA: Diagnosis not present

## 2022-01-21 DIAGNOSIS — M75100 Unspecified rotator cuff tear or rupture of unspecified shoulder, not specified as traumatic: Secondary | ICD-10-CM

## 2022-01-21 DIAGNOSIS — R079 Chest pain, unspecified: Secondary | ICD-10-CM | POA: Insufficient documentation

## 2022-01-21 MED ORDER — NITROGLYCERIN 0.4 MG SL SUBL
SUBLINGUAL_TABLET | SUBLINGUAL | Status: AC
  Filled 2022-01-21: qty 2

## 2022-01-21 MED ORDER — NITROGLYCERIN 0.4 MG SL SUBL
0.8000 mg | SUBLINGUAL_TABLET | Freq: Once | SUBLINGUAL | Status: AC
  Administered 2022-01-21: 0.8 mg via SUBLINGUAL

## 2022-01-21 MED ORDER — NITROGLYCERIN 0.4 MG SL SUBL: 0.8000 mg | SUBLINGUAL_TABLET | Freq: Once | SUBLINGUAL | Status: DC

## 2022-01-21 MED ORDER — IOHEXOL 350 MG/ML SOLN
100.0000 mL | Freq: Once | INTRAVENOUS | Status: AC | PRN
  Administered 2022-01-21: 100 mL via INTRAVENOUS

## 2022-01-27 DIAGNOSIS — M7541 Impingement syndrome of right shoulder: Secondary | ICD-10-CM | POA: Diagnosis not present

## 2022-01-27 DIAGNOSIS — M25811 Other specified joint disorders, right shoulder: Secondary | ICD-10-CM | POA: Diagnosis not present

## 2022-01-27 DIAGNOSIS — M542 Cervicalgia: Secondary | ICD-10-CM | POA: Diagnosis not present

## 2022-01-30 ENCOUNTER — Other Ambulatory Visit: Payer: Self-pay

## 2022-01-30 ENCOUNTER — Ambulatory Visit (INDEPENDENT_AMBULATORY_CARE_PROVIDER_SITE_OTHER): Payer: PPO

## 2022-01-30 ENCOUNTER — Other Ambulatory Visit: Payer: Self-pay | Admitting: Nurse Practitioner

## 2022-01-30 ENCOUNTER — Telehealth: Payer: Self-pay | Admitting: Family Medicine

## 2022-01-30 DIAGNOSIS — R0602 Shortness of breath: Secondary | ICD-10-CM | POA: Diagnosis not present

## 2022-01-30 LAB — ECHOCARDIOGRAM COMPLETE
AR max vel: 1.76 cm2
AV Area VTI: 2.04 cm2
AV Area mean vel: 2.08 cm2
AV Mean grad: 3.4 mmHg
AV Peak grad: 7.5 mmHg
Ao pk vel: 1.37 m/s
Area-P 1/2: 2.39 cm2
S' Lateral: 3.37 cm

## 2022-01-30 NOTE — Telephone Encounter (Signed)
On 01/16/22 Dr Adriana Simas gave her Tramadol 50mg  #15 one to two tablets every 8 hours as needed for pain per [facility]

## 2022-01-30 NOTE — Telephone Encounter (Signed)
Patient is requesting refill on pain medication she just needs enough until  shoulder surgery on 3/8. [facility]

## 2022-02-01 ENCOUNTER — Other Ambulatory Visit: Payer: Self-pay | Admitting: Nurse Practitioner

## 2022-02-01 MED ORDER — TRAMADOL HCL 50 MG PO TABS: ORAL_TABLET | ORAL | 0 refills | Status: DC

## 2022-02-01 NOTE — Telephone Encounter (Signed)
Refill sent in

## 2022-02-02 ENCOUNTER — Ambulatory Visit (INDEPENDENT_AMBULATORY_CARE_PROVIDER_SITE_OTHER): Payer: PPO | Admitting: Gastroenterology

## 2022-02-04 ENCOUNTER — Ambulatory Visit (HOSPITAL_COMMUNITY): Payer: PPO

## 2022-02-06 ENCOUNTER — Encounter: Payer: Self-pay | Admitting: Internal Medicine

## 2022-02-06 NOTE — Telephone Encounter (Signed)
Echo Result explained to patient by phone ?Verbalized understanding ?

## 2022-02-11 DIAGNOSIS — M75101 Unspecified rotator cuff tear or rupture of right shoulder, not specified as traumatic: Secondary | ICD-10-CM | POA: Insufficient documentation

## 2022-02-11 DIAGNOSIS — M75121 Complete rotator cuff tear or rupture of right shoulder, not specified as traumatic: Secondary | ICD-10-CM | POA: Diagnosis not present

## 2022-02-11 DIAGNOSIS — M7541 Impingement syndrome of right shoulder: Secondary | ICD-10-CM | POA: Diagnosis not present

## 2022-02-11 DIAGNOSIS — M19011 Primary osteoarthritis, right shoulder: Secondary | ICD-10-CM | POA: Diagnosis not present

## 2022-02-11 DIAGNOSIS — S4381XA Sprain of other specified parts of right shoulder girdle, initial encounter: Secondary | ICD-10-CM | POA: Diagnosis not present

## 2022-02-11 DIAGNOSIS — M75111 Incomplete rotator cuff tear or rupture of right shoulder, not specified as traumatic: Secondary | ICD-10-CM | POA: Diagnosis not present

## 2022-02-11 DIAGNOSIS — M24111 Other articular cartilage disorders, right shoulder: Secondary | ICD-10-CM | POA: Diagnosis not present

## 2022-02-11 DIAGNOSIS — G8918 Other acute postprocedural pain: Secondary | ICD-10-CM | POA: Diagnosis not present

## 2022-02-11 DIAGNOSIS — M948X1 Other specified disorders of cartilage, shoulder: Secondary | ICD-10-CM | POA: Diagnosis not present

## 2022-02-11 DIAGNOSIS — M7551 Bursitis of right shoulder: Secondary | ICD-10-CM | POA: Diagnosis not present

## 2022-02-11 DIAGNOSIS — M7521 Bicipital tendinitis, right shoulder: Secondary | ICD-10-CM | POA: Diagnosis not present

## 2022-02-24 ENCOUNTER — Ambulatory Visit: Payer: PPO

## 2022-02-25 ENCOUNTER — Other Ambulatory Visit: Payer: Self-pay

## 2022-02-25 ENCOUNTER — Encounter (HOSPITAL_COMMUNITY): Payer: Self-pay

## 2022-02-25 ENCOUNTER — Ambulatory Visit (HOSPITAL_COMMUNITY): Payer: PPO | Attending: Orthopedic Surgery

## 2022-02-25 DIAGNOSIS — R29898 Other symptoms and signs involving the musculoskeletal system: Secondary | ICD-10-CM | POA: Diagnosis not present

## 2022-02-25 DIAGNOSIS — M25511 Pain in right shoulder: Secondary | ICD-10-CM | POA: Diagnosis not present

## 2022-02-25 DIAGNOSIS — M25611 Stiffness of right shoulder, not elsewhere classified: Secondary | ICD-10-CM | POA: Diagnosis not present

## 2022-02-25 NOTE — Therapy (Signed)
OUTPATIENT OCCUPATIONAL THERAPY ORTHO EVALUATION  Patient Name: [name] MRN: [id number] DOB:1956-04-28, 66 y.o., female Today's Date: 02/25/2022  PCP: [name], DO REFERRING PROVIDER: Yolonda Kida, MD   OT End of Session - 02/25/22 1732     Visit Number 1    Number of Visits 24    Date for OT Re-Evaluation 05/20/22   mini reassess:03/25/22   Authorization Type [employer]    Authorization Time Period $15 copay no visit limit no auth needed.    Progress Note Due on Visit 10    OT Start Time 1654   checked in late   OT Stop Time 1724    OT Time Calculation (min) 30 min    Activity Tolerance Patient tolerated treatment well    Behavior During Therapy [facility] for tasks assessed/performed             Past Medical History:  Diagnosis Date   Anxiety    Arthritis    Depression    GERD (gastroesophageal reflux disease)    Hypertension    Hypothyroid    Hypothyroidism    Moderate persistent asthma 01/20/2021   PONV (postoperative nausea and vomiting)    Recurrent upper respiratory infection (URI)    Urticaria    Past Surgical History:  Procedure Laterality Date   BACK SURGERY     BIOPSY  06/03/2020   Procedure: BIOPSY;  Surgeon: Malissa Hippo, MD;  [facility];  Service: Endoscopy;;   CARPAL TUNNEL RELEASE  06/16/2012   Procedure: CARPAL TUNNEL RELEASE;  Surgeon: Dominica Severin, MD;  Location: [facility];  Service: Orthopedics;  Laterality: Right;  limited open carpal tunnel release   CERVICAL FUSION  2018   CHOLECYSTECTOMY     COLONOSCOPY     COLONOSCOPY WITH PROPOFOL N/A 06/03/2020   Procedure: COLONOSCOPY WITH PROPOFOL;  Surgeon: Malissa Hippo, MD;  [facility];  Service: Endoscopy;  Laterality: N/A;  115   ESOPHAGOGASTRODUODENOSCOPY (EGD) WITH PROPOFOL N/A 06/03/2020   Procedure: ESOPHAGOGASTRODUODENOSCOPY (EGD) WITH PROPOFOL;  Surgeon: Malissa Hippo, MD;  [facility];  Service:  Endoscopy;  Laterality: N/A;   TONSILLECTOMY     TRIGGER FINGER RELEASE  06/16/2012   Procedure: RELEASE TRIGGER FINGER/A-1 PULLEY;  Surgeon: Dominica Severin, MD;  Location: [facility];  Service: Orthopedics;  Laterality: Right;  right middle a-1 release   TUBAL LIGATION     UPPER GASTROINTESTINAL ENDOSCOPY     VAGINAL HYSTERECTOMY     Patient Active Problem List   Diagnosis Date Noted   Atypical chest pain 01/08/2022   Seasonal and perennial allergic rhinitis 12/04/2021   Right shoulder pain 09/18/2021   Healthcare maintenance 09/18/2021   Panic attack 09/05/2021   IBS (irritable bowel syndrome) 07/31/2021   Moderate persistent asthma with acute exacerbation 01/20/2021   Prediabetes 12/13/2017   Hyperlipidemia 12/04/2016   Rosacea 03/07/2014   Chronic low back pain 10/22/2013   Anxiety 08/23/2013   Depression 05/15/2013   Urinary incontinence in female 02/23/2013   Gastroesophageal reflux disease 09/19/2012   Intestinal bacterial overgrowth 07/25/2012   Essential hypertension 07/25/2012   Hypothyroidism 07/25/2012   CTS (carpal tunnel syndrome) 04/21/2012    ONSET DATE: 02/11/22  REFERRING DIAG: Dr. Duwayne Heck  THERAPY DIAG:  Other symptoms and signs involving the musculoskeletal system  Stiffness of right shoulder, not elsewhere classified  Acute pain of right shoulder  SUBJECTIVE:   SUBJECTIVE STATEMENT: S: This sling is pulling  on my neck. Pt accompanied by: self  PERTINENT HISTORY: Patient underwent surgery to right shoulder shoulder scope RCR, DCR, SAD completed on 02/11/22  PRECAUTIONS: Shoulder see media tab for protocol. Sling on at all times except to bath and complete HEP.  WEIGHT BEARING RESTRICTIONS Yes NWB RUE  PAIN:  Are you having pain? Yes: NPRS scale: 6/10 Pain location: right shoulder Pain description: Swollen, aching, constant Aggravating factors: sleep position Relieving factors: ice, pain medication  FALLS: Has patient  fallen in last 6 months? No,   LIVING ENVIRONMENT: Lives with: lives with their spouse    PLOF: Independent  PATIENT GOALS Return to using her RUE normal without pain.  OBJECTIVE:   HAND DOMINANCE: Right  ADLs: Overall ADLs: Unable to utilize her RUE for any ADL tasks.    FUNCTIONAL OUTCOME MEASURES: FOTO: COMPLETE NEXT SESSION  UE ROM     Passive ROM Right 02/25/2022  Shoulder flexion 105  Shoulder abduction 60  Shoulder internal rotation 90 adducted  Shoulder external rotation 25 adducted  (Blank rows = not tested)  A/ROM not assessed due to protocol. Active ROM Right   Shoulder flexion   Shoulder abduction   Shoulder internal rotation   Shoulder external rotation   (Blank rows = not tested)    UE MMT:     Strength not assessed due to protocol. MMT Right   Shoulder flexion   Shoulder adduction   Shoulder internal rotation   Shoulder external rotation   (Blank rows = not tested)    COGNITION: Overall cognitive status: Within functional limits for tasks assessed   OBSERVATIONS: Max fascial restrictions noted in the right upper arm, upper trapezius, scapularis.      PATIENT EDUCATION: Education details: table slides, A/ROM wrist and elbow Person educated: Patient Education method: Explanation, Demonstration, Verbal cues, and Handouts Education comprehension: verbalized understanding and returned demonstration   HOME EXERCISE PROGRAM: Eval: table slides, A/ROM wrist and elbow  GOALS: Goals reviewed with patient? Yes  SHORT TERM GOALS: Target date: 04/08/2022  Patient will be educated and independent with HEP in order to facilitate progress in therapy and begin to utilize her RUE for basic ADL tasks.   Goal status: INITIAL  2.  Pt will increase her RUE P/ROM to [facility], In in order to increase ability to complete upper body dressing tasks.   Goal status: INITIAL  3.  Pt will increase her RUE strength to 3/5 while beginning to use her RUE to  complete reaching tasks to at least shoulder height.  Goal status: INITIAL  4.  Pt will report a pain level of no more than 5/10 when utilizing her RUE to complete basic self care tasks such as bathing, dressing, and grooming.   Goal status: INITIAL  5.  Pt will decrease her RUE fascial restrictions to moderate amount in order to increase the functional mobility needed to complete low level reaching tasks.   Goal status: INITIAL     LONG TERM GOALS: Target date: 05/20/2022  Pt will increase her RUE A/ROM to WNL in order to complete any high level reaching tasks, behind her back, or behind her head with less difficulty.   Goal status: INITIAL  2.  Pt will increase her RUE strength to 4/5 or better in order return to performing housekeeping and meal prep tasks as desired.   Goal status: INITIAL  3.  Pt will report a pain level of 3/10 or less when using her RUE to complete all daily tasks.  Goal status: INITIAL  4.  Pt will decrease her RUE fascial restrictions to minimal amount or less in order to increase the functional mobility needed to complete all reaching tasks.   Goal status: INITIAL     ASSESSMENT:  CLINICAL IMPRESSION: Patient is a 67 y.o. female who was seen today S/P right RTC repair and SAD causing increased pain, fascial restrictions, and decreased ROM and strength resulting in difficulty utilizing her RUE for any daily task  .   PERFORMANCE DEFICITS in functional skills including ADLs, IADLs, edema, ROM, strength, pain, fascial restrictions, mobility, and UE functional use.  IMPAIRMENTS are limiting patient from ADLs, IADLs, rest and sleep, and leisure.   COMORBIDITIES may have co-morbidities  that affects occupational performance. Patient will benefit from skilled OT to address above impairments and improve overall function.  MODIFICATION OR ASSISTANCE TO COMPLETE EVALUATION: No modification of tasks or assist necessary to complete an evaluation.  OT  OCCUPATIONAL PROFILE AND HISTORY: Problem focused assessment: Including review of records relating to presenting problem.  CLINICAL DECISION MAKING: LOW - limited treatment options, no task modification necessary  REHAB POTENTIAL: Excellent  EVALUATION COMPLEXITY: Low      PLAN: OT FREQUENCY: 2x/week  OT DURATION: 12 weeks  PLANNED INTERVENTIONS: self care/ADL training, therapeutic exercise, therapeutic activity, neuromuscular re-education, manual therapy, passive range of motion, electrical stimulation, ultrasound, moist heat, cryotherapy, patient/family education, and DME and/or AE instructions    CONSULTED AND AGREED WITH PLAN OF CARE: Patient  PLAN FOR NEXT SESSION: Complete FOTO. Follow protocol. Start Manual techniques, passive ROM.    Limmie Patricia, OTR/L,CBIS  [phone]  ` 02/25/2022, 5:34 PM

## 2022-02-25 NOTE — Patient Instructions (Signed)
TOWEL SLIDES COMPLETE FOR 1-3 MINUTES, 3-5 TIMES PER DAY ? ?SHOULDER: Flexion On Table ? ? ?Place hands on table, elbows straight. Move hips away from body. Press hands down into table. Hold ___ seconds. ___ reps per set, ___ sets per day, ___ days per week ? ?Abduction (Passive) ? ? ?With arm out to side, resting on table, lower head toward arm, keeping trunk away from table. Hold ____ seconds. ?Repeat ____ times. Do ____ sessions per day. ? ?Copyright ? V[employer]. All rights reserved.  ? ? ? ?Internal Rotation (Assistive) ? ? ?Seated with elbow bent at right angle and held against side, slide arm on table surface in an inward arc. ?Repeat ____ times. Do ____ sessions per day. ?Activity: Use this motion to brush crumbs off the table. ? ?Copyright ? VHI. All rights reserved.  ? ? ?Copyright ? VHI. All rights reserved.  ?AROM: Wrist Extension ? ? ?With right palm down, bend wrist up. ?Repeat 10____ times per set. Do ____ sets per session. Do __3__ sessions per day. ? ?Copyright ? VHI. All rights reserved.  ? ?AROM: Wrist Flexion ? ? ?With right palm up, bend wrist up. ?Repeat ___10_ times per set. Do ____ sets per session. Do __3__ sessions per day. ? ?Copyright ? VHI. All rights reserved.  ? ?AROM: Forearm Pronation / Supination ? ? ?With right arm in handshake position, slowly rotate palm down until stretch is felt. Relax. Then rotate palm up until stretch is felt. ?Repeat __10__ times per set. Do ____ sets per session. Do __3__ sessions per day. ? ?Copyright ? VHI. All rights reserved.  ? ?ELBOW FLEXION EXTENSION ? ?Start with your arm at your side. Bend at your elbow to raise your forearm/hand upwards as shown. Then return to starting position and repeat.  Complete 10 times.  ? ? ?

## 2022-02-27 ENCOUNTER — Other Ambulatory Visit: Payer: Self-pay

## 2022-02-27 ENCOUNTER — Encounter (HOSPITAL_COMMUNITY): Payer: Self-pay | Admitting: Occupational Therapy

## 2022-02-27 ENCOUNTER — Ambulatory Visit (HOSPITAL_COMMUNITY): Payer: PPO | Admitting: Occupational Therapy

## 2022-02-27 DIAGNOSIS — M25611 Stiffness of right shoulder, not elsewhere classified: Secondary | ICD-10-CM

## 2022-02-27 DIAGNOSIS — M25511 Pain in right shoulder: Secondary | ICD-10-CM

## 2022-02-27 DIAGNOSIS — R29898 Other symptoms and signs involving the musculoskeletal system: Secondary | ICD-10-CM | POA: Diagnosis not present

## 2022-02-27 NOTE — Therapy (Signed)
?OUTPATIENT OCCUPATIONAL THERAPY TREATMENT NOTE ? ? ?Patient Name: [name] ?MRN: [id number] ?DOB:[birth year], 66 y.o., female ?Today's Date: 02/27/2022 ? ?PCP: Coral Spikes, DO ?REFERRING PROVIDER: Dr. Victorino December ? ? OT End of Session - 02/27/22 1206   ? ? Visit Number 2   ? Number of Visits 24   ? Date for OT Re-Evaluation 05/20/22   mini reassess:03/25/22  ? Authorization Type [employer]   ? Authorization Time Period $15 copay no visit limit no auth needed.   ? Progress Note Due on Visit 10   ? OT Start Time 1117   ? OT Stop Time 1200   ? OT Time Calculation (min) 43 min   ? Activity Tolerance Patient tolerated treatment well   ? Behavior During Therapy [facility] for tasks assessed/performed   ? ?  ?  ? ?  ? ? ?Past Medical History:  ?Diagnosis Date  ? Anxiety   ? Arthritis   ? Depression   ? GERD (gastroesophageal reflux disease)   ? Hypertension   ? Hypothyroid   ? Hypothyroidism   ? Moderate persistent asthma 01/20/2021  ? PONV (postoperative nausea and vomiting)   ? Recurrent upper respiratory infection (URI)   ? Urticaria   ? ?Past Surgical History:  ?Procedure Laterality Date  ? BACK SURGERY    ? BIOPSY  06/03/2020  ? Procedure: BIOPSY;  Surgeon: Rogene Houston, MD;  [facility];  Service: Endoscopy;;  ? CARPAL TUNNEL RELEASE  06/16/2012  ? Procedure: CARPAL TUNNEL RELEASE;  Surgeon: Roseanne Kaufman, MD;  Location: [place];  Service: Orthopedics;  Laterality: Right;  limited open carpal tunnel release  ? CERVICAL FUSION  2018  ? CHOLECYSTECTOMY    ? COLONOSCOPY    ? COLONOSCOPY WITH PROPOFOL N/A 06/03/2020  ? Procedure: COLONOSCOPY WITH PROPOFOL;  Surgeon: Rogene Houston, MD;  [facility];  Service: Endoscopy;  Laterality: N/A;  115  ? ESOPHAGOGASTRODUODENOSCOPY (EGD) WITH PROPOFOL N/A 06/03/2020  ? Procedure: ESOPHAGOGASTRODUODENOSCOPY (EGD) WITH PROPOFOL;  Surgeon: Rogene Houston, MD;  [facility];  Service: Endoscopy;  Laterality: N/A;  ?  TONSILLECTOMY    ? TRIGGER FINGER RELEASE  06/16/2012  ? Procedure: RELEASE TRIGGER FINGER/A-1 PULLEY;  Surgeon: Roseanne Kaufman, MD;  Location: [place];  Service: Orthopedics;  Laterality: Right;  right middle a-1 release  ? TUBAL LIGATION    ? UPPER GASTROINTESTINAL ENDOSCOPY    ? VAGINAL HYSTERECTOMY    ? ?Patient Active Problem List  ? Diagnosis Date Noted  ? Atypical chest pain 01/08/2022  ? Seasonal and perennial allergic rhinitis 12/04/2021  ? Right shoulder pain 09/18/2021  ? Healthcare maintenance 09/18/2021  ? Panic attack 09/05/2021  ? IBS (irritable bowel syndrome) 07/31/2021  ? Moderate persistent asthma with acute exacerbation 01/20/2021  ? Prediabetes 12/13/2017  ? Hyperlipidemia 12/04/2016  ? Rosacea 03/07/2014  ? Chronic low back pain 10/22/2013  ? Anxiety 08/23/2013  ? Depression 05/15/2013  ? Urinary incontinence in female 02/23/2013  ? Gastroesophageal reflux disease 09/19/2012  ? Intestinal bacterial overgrowth 07/25/2012  ? Essential hypertension 07/25/2012  ? Hypothyroidism 07/25/2012  ? CTS (carpal tunnel syndrome) 04/21/2012  ? ? ?ONSET DATE: 02/11/22 ? ?REFERRING DIAG: right shoulder shoulder scope RCR, DCR, SAD ? ?THERAPY DIAG:  ?Other symptoms and signs involving the musculoskeletal system ? ?Stiffness of right shoulder, not elsewhere classified ? ?Acute pain of right shoulder ? ? ?PERTINENT HISTORY: Patient underwent surgery to right shoulder shoulder  scope RCR, DCR, SAD completed on 02/11/22 ? ?PRECAUTIONS: Shoulder see media tab for protocol. Sling on at all times except to bath and complete HEP. ? ?SUBJECTIVE: S: I'm having more anxiety than pain because I can't do anything.  ? ?PAIN:  ?Are you having pain? No ? ? ? ? ?OBJECTIVE: From Evaluation: ? ?UE ROM    ?  ?Passive ROM Right ?02/25/2022  ?Shoulder flexion 105  ?Shoulder abduction 60  ?Shoulder internal rotation 90 adducted  ?Shoulder external rotation 25 adducted  ?(Blank rows = not tested) ?  ?A/ROM not assessed due  to protocol. ?Active ROM Right ?   ?Shoulder flexion    ?Shoulder abduction    ?Shoulder internal rotation    ?Shoulder external rotation    ?(Blank rows = not tested) ?  ?  ?  ?UE MMT:    ?  ?Strength not assessed due to protocol. ?MMT Right ?   ?Shoulder flexion    ?Shoulder adduction    ?Shoulder internal rotation    ?Shoulder external rotation    ?(Blank rows = not tested) ? ? ?SHORT TERM GOALS: Target date: 04/08/2022 ?  ?Patient will be educated and independent with HEP in order to facilitate progress in therapy and begin to utilize her RUE for basic ADL tasks.  ?  ?Goal status: Ongoing ?  ?2.  Pt will increase her RUE P/ROM to [facility] in order to increase ability to complete upper body dressing tasks.  ?  ?Goal status: Ongoing ?  ?3.  Pt will increase her RUE strength to 3/5 while beginning to use her RUE to complete reaching tasks to at least shoulder height. ?  ?Goal status: Ongoing ?  ?4.  Pt will report a pain level of no more than 5/10 when utilizing her RUE to complete basic self care tasks such as bathing, dressing, and grooming.  ?  ?Goal status:Ongoing ?  ?5.  Pt will decrease her RUE fascial restrictions to moderate amount in order to increase the functional mobility needed to complete low level reaching tasks.  ?  ?Goal status: Ongoing ?  ?  ?  ?  ?LONG TERM GOALS: Target date: 05/20/2022 ?  ?Pt will increase her RUE A/ROM to WNL in order to complete any high level reaching tasks, behind her back, or behind her head with less difficulty.  ?  ?Goal status: Ongoing ?  ?2.  Pt will increase her RUE strength to 4/5 or better in order return to performing housekeeping and meal prep tasks as desired.  ?  ?Goal status: Ongoing ?  ?3.  Pt will report a pain level of 3/10 or less when using her RUE to complete all daily tasks.  ?  ?Goal status: Ongoing ?  ?4.  Pt will decrease her RUE fascial restrictions to minimal amount or less in order to increase the functional mobility needed to complete all reaching tasks.   ?  ?Goal status: Ongoing ?  ? ?TODAY'S TREATMENT: ?02/27/22:  ?-Myofascial release: completed separately from therapeutic exercises. Myofascial release to right upper arm, anterior shoulder, and trapezius regions to decrease pain and fascial restrictions and increase joint ROM.  ?-P/ROM: shoulder, all ranges, 10X each ?-Scapular A/ROM: row, extension, scapular depression, 10X each ?-Pendulums: side to side, forward and backward, circles each direction, 1' each ?-Therapy ball stretches: flexion, abduction, 10X each with 2" hold at end ROM ? ? ? ?   ?OT Assessment and Plan  ?Clinical Impression Statement A: Initiated myofascial release to RUE  today. Completed P/ROM to RUE, pt with most discomfort with er. Pt completing scapular A/ROM, therapy ball stretches, and pendulums this session. Verbal cuing for form and technique.   ?Plan P: Continue with protocol, working to progress ROM as pt is able to tolerate, add isometrics  ?Consulted and Agree with Plan of Care Patient  ? ? ? ? ? ?Guadelupe Sabin, OTR/L  ?[phone] ?02/27/2022, 12:06 PM ? ?  ? ? ? ?

## 2022-03-03 ENCOUNTER — Encounter (HOSPITAL_COMMUNITY): Payer: Self-pay | Admitting: Occupational Therapy

## 2022-03-03 ENCOUNTER — Other Ambulatory Visit: Payer: Self-pay

## 2022-03-03 ENCOUNTER — Ambulatory Visit (HOSPITAL_COMMUNITY): Payer: PPO | Admitting: Occupational Therapy

## 2022-03-03 DIAGNOSIS — R29898 Other symptoms and signs involving the musculoskeletal system: Secondary | ICD-10-CM | POA: Diagnosis not present

## 2022-03-03 DIAGNOSIS — M25611 Stiffness of right shoulder, not elsewhere classified: Secondary | ICD-10-CM

## 2022-03-03 DIAGNOSIS — M25511 Pain in right shoulder: Secondary | ICD-10-CM

## 2022-03-03 NOTE — Therapy (Signed)
?OUTPATIENT OCCUPATIONAL THERAPY TREATMENT NOTE ? ? ?Patient Name: [name] ?MRN: [id number] ?DOB:1956-08-05, 66 y.o., female ?Today's Date: 03/03/2022 ? ?PCP: Coral Spikes, DO ?REFERRING PROVIDER: Dr. Victorino December ? ? OT End of Session - 03/03/22 1615   ? ? Visit Number 3   ? Number of Visits 24   ? Date for OT Re-Evaluation 05/20/22   mini reassess:03/25/22  ? Authorization Type [employer]   ? Authorization Time Period $15 copay no visit limit no auth needed.   ? Progress Note Due on Visit 10   ? OT Start Time 1524   pt arrived late  ? OT Stop Time 1558   ? OT Time Calculation (min) 34 min   ? Activity Tolerance Patient tolerated treatment well   ? Behavior During Therapy [facility] for tasks assessed/performed   ? ?  ?  ? ?  ? ? ? ?Past Medical History:  ?Diagnosis Date  ? Anxiety   ? Arthritis   ? Depression   ? GERD (gastroesophageal reflux disease)   ? Hypertension   ? Hypothyroid   ? Hypothyroidism   ? Moderate persistent asthma 01/20/2021  ? PONV (postoperative nausea and vomiting)   ? Recurrent upper respiratory infection (URI)   ? Urticaria   ? ?Past Surgical History:  ?Procedure Laterality Date  ? BACK SURGERY    ? BIOPSY  06/03/2020  ? Procedure: BIOPSY;  Surgeon: Rogene Houston, MD;  [facility];  Service: Endoscopy;;  ? CARPAL TUNNEL RELEASE  06/16/2012  ? Procedure: CARPAL TUNNEL RELEASE;  Surgeon: Roseanne Kaufman, MD;  Location: [place];  Service: Orthopedics;  Laterality: Right;  limited open carpal tunnel release  ? CERVICAL FUSION  2018  ? CHOLECYSTECTOMY    ? COLONOSCOPY    ? COLONOSCOPY WITH PROPOFOL N/A 06/03/2020  ? Procedure: COLONOSCOPY WITH PROPOFOL;  Surgeon: Rogene Houston, MD;  [facility];  Service: Endoscopy;  Laterality: N/A;  115  ? ESOPHAGOGASTRODUODENOSCOPY (EGD) WITH PROPOFOL N/A 06/03/2020  ? Procedure: ESOPHAGOGASTRODUODENOSCOPY (EGD) WITH PROPOFOL;  Surgeon: Rogene Houston, MD;  [facility];  Service: Endoscopy;   Laterality: N/A;  ? TONSILLECTOMY    ? TRIGGER FINGER RELEASE  06/16/2012  ? Procedure: RELEASE TRIGGER FINGER/A-1 PULLEY;  Surgeon: Roseanne Kaufman, MD;  Location: [place];  Service: Orthopedics;  Laterality: Right;  right middle a-1 release  ? TUBAL LIGATION    ? UPPER GASTROINTESTINAL ENDOSCOPY    ? VAGINAL HYSTERECTOMY    ? ?Patient Active Problem List  ? Diagnosis Date Noted  ? Atypical chest pain 01/08/2022  ? Seasonal and perennial allergic rhinitis 12/04/2021  ? Right shoulder pain 09/18/2021  ? Healthcare maintenance 09/18/2021  ? Panic attack 09/05/2021  ? IBS (irritable bowel syndrome) 07/31/2021  ? Moderate persistent asthma with acute exacerbation 01/20/2021  ? Prediabetes 12/13/2017  ? Hyperlipidemia 12/04/2016  ? Rosacea 03/07/2014  ? Chronic low back pain 10/22/2013  ? Anxiety 08/23/2013  ? Depression 05/15/2013  ? Urinary incontinence in female 02/23/2013  ? Gastroesophageal reflux disease 09/19/2012  ? Intestinal bacterial overgrowth 07/25/2012  ? Essential hypertension 07/25/2012  ? Hypothyroidism 07/25/2012  ? CTS (carpal tunnel syndrome) 04/21/2012  ? ? ?ONSET DATE: 02/11/22 ? ?REFERRING DIAG: right shoulder shoulder scope RCR, DCR, SAD ? ?THERAPY DIAG:  ?Other symptoms and signs involving the musculoskeletal system ? ?Stiffness of right shoulder, not elsewhere classified ? ?Acute pain of right shoulder ? ? ?PERTINENT HISTORY: Patient underwent  surgery to right shoulder shoulder scope RCR, DCR, SAD completed on 02/11/22 ? ?PRECAUTIONS: Shoulder see media tab for protocol. Sling on at all times except to bath and complete HEP. ? ?SUBJECTIVE: S: I'm doing better.  ? ?PAIN:  ?Are you having pain? No ? ? ? ? ?OBJECTIVE: From Evaluation: ? ?UE ROM    ?  ?Passive ROM Right ?02/25/2022  ?Shoulder flexion 105  ?Shoulder abduction 60  ?Shoulder internal rotation 90 adducted  ?Shoulder external rotation 25 adducted  ?(Blank rows = not tested) ?  ?A/ROM not assessed due to protocol. ?Active ROM  Right ?   ?Shoulder flexion    ?Shoulder abduction    ?Shoulder internal rotation    ?Shoulder external rotation    ?(Blank rows = not tested) ?  ?  ?  ?UE MMT:    ?  ?Strength not assessed due to protocol. ?MMT Right ?   ?Shoulder flexion    ?Shoulder adduction    ?Shoulder internal rotation    ?Shoulder external rotation    ?(Blank rows = not tested) ? ? ?SHORT TERM GOALS: Target date: 04/08/2022 ?  ?Patient will be educated and independent with HEP in order to facilitate progress in therapy and begin to utilize her RUE for basic ADL tasks.  ?  ?Goal status: Ongoing ?  ?2.  Pt will increase her RUE P/ROM to [facility] in order to increase ability to complete upper body dressing tasks.  ?  ?Goal status: Ongoing ?  ?3.  Pt will increase her RUE strength to 3/5 while beginning to use her RUE to complete reaching tasks to at least shoulder height. ?  ?Goal status: Ongoing ?  ?4.  Pt will report a pain level of no more than 5/10 when utilizing her RUE to complete basic self care tasks [name] as bathing, dressing, and grooming.  ?  ?Goal status:Ongoing ?  ?5.  Pt will decrease her RUE fascial restrictions to moderate amount in order to increase the functional mobility needed to complete low level reaching tasks.  ?  ?Goal status: Ongoing ?  ?  ?  ?  ?LONG TERM GOALS: Target date: 05/20/2022 ?  ?Pt will increase her RUE A/ROM to WNL in order to complete any high level reaching tasks, behind her back, or behind her head with less difficulty.  ?  ?Goal status: Ongoing ?  ?2.  Pt will increase her RUE strength to 4/5 or better in order return to performing housekeeping and meal prep tasks as desired.  ?  ?Goal status: Ongoing ?  ?3.  Pt will report a pain level of 3/10 or less when using her RUE to complete all daily tasks.  ?  ?Goal status: Ongoing ?  ?4.  Pt will decrease her RUE fascial restrictions to minimal amount or less in order to increase the functional mobility needed to complete all reaching tasks.  ?  ?Goal status:  Ongoing ?  ? ?TODAY'S TREATMENT: ?03/03/22 ?-P/ROM: shoulder, all ranges, 10X each ?-Isometrics: 3x5", all planes ?-Scapular A/ROM: row, extension, scapular depression, 12X each ?-Therapy ball stretches: flexion, abduction, 10X each with 2" hold at end ROM ?-low level thumb tacks: 1'  ?-prot/ret/elev/dep: 1'  ?-pulleys: 1' flexion 1' abduction ? ? ?02/27/22:  ?-Myofascial release: completed separately from therapeutic exercises. Myofascial release to right upper arm, anterior shoulder, and trapezius regions to decrease pain and fascial restrictions and increase joint ROM.  ?-P/ROM: shoulder, all ranges, 10X each ?-Scapular A/ROM: row, extension, scapular depression, 10X each ?-  Pendulums: side to side, forward and backward, circles each direction, 1' each ?-Therapy ball stretches: flexion, abduction, 10X each with 2" hold at end ROM ? ? ? ?   ?OT Assessment and Plan  ?Clinical Impression Statement A: Continued with myofascial release to RUE today. Completed P/ROM to RUE, scapular A/ROM increasing repetitions to 12. Added low level thumb tacks, pro/ret/elev/dep, and pulleys this session. Verbal cuing for form and technique. Pt is demonstrating ROM WFL at this time, has made great progress with ROM tolerance. Pt agreeable to decrease to 1x/week until she is able to begin next phase of protocol.   ?Plan P: Continue with protocol-phase I  ?Consulted and Agree with Plan of Care Patient  ? ? ? ? ? ?Guadelupe Sabin, OTR/L  ?[phone] ?03/03/2022, 4:17 PM ? ?  ? ? ? ?

## 2022-03-04 ENCOUNTER — Ambulatory Visit: Payer: PPO | Admitting: Family Medicine

## 2022-03-04 ENCOUNTER — Encounter: Payer: Self-pay | Admitting: Family Medicine

## 2022-03-04 DIAGNOSIS — E038 Other specified hypothyroidism: Secondary | ICD-10-CM

## 2022-03-04 DIAGNOSIS — I10 Essential (primary) hypertension: Secondary | ICD-10-CM

## 2022-03-04 DIAGNOSIS — J45909 Unspecified asthma, uncomplicated: Secondary | ICD-10-CM | POA: Insufficient documentation

## 2022-03-04 DIAGNOSIS — F32A Depression, unspecified: Secondary | ICD-10-CM | POA: Insufficient documentation

## 2022-03-04 DIAGNOSIS — I2510 Atherosclerotic heart disease of native coronary artery without angina pectoris: Secondary | ICD-10-CM | POA: Insufficient documentation

## 2022-03-04 DIAGNOSIS — F419 Anxiety disorder, unspecified: Secondary | ICD-10-CM | POA: Diagnosis not present

## 2022-03-04 MED ORDER — LEVOTHYROXINE SODIUM 137 MCG PO TABS: ORAL_TABLET | ORAL | 3 refills | Status: DC

## 2022-03-04 MED ORDER — SERTRALINE HCL 100 MG PO TABS: ORAL_TABLET | ORAL | 3 refills | Status: DC

## 2022-03-04 NOTE — Progress Notes (Signed)
? ?Subjective:  ?Patient ID: [name], female    DOB: 27-Dec-1955  Age: 66 y.o. MRN: [id number] ? ?CC: ?Chief Complaint  ?Patient presents with  ? Discuss Meds  ?  Pt is wanting to know if she is able to take meds with other meds; can she take some in the Am and some in PM. Needing refills on Sertraline and Levothyroxine. Pt unable to put hearing aid in left ear.   ? ? ?HPI: ? ?66 year old female with hypertension, hypothyroidism, hyperlipidemia, depression and anxiety, and recent diagnosis of nonobstructive coronary artery disease presents for follow-up. ? ?Patient states that she would like to discuss her medications and ensure that she is taking them properly. ? ?Patient recently seen by cardiology.  Had a CT coronary which revealed nonobstructive coronary disease.  Patient also found to have elevated levels of lipoprotein a.  Her Crestor was recently increased to 20 mg daily.  She is tolerating this well. ? ?BP slightly elevated today.  However, hypertension has been stable.  She is currently on losartan. ? ?Recently had rotator cuff surgery and is doing well. ? ?Depression and anxiety has been stable.  She is requesting refill of Zoloft. ? ?Patient Active Problem List  ? Diagnosis Date Noted  ? Asthma 03/04/2022  ? CAD (coronary artery disease) 03/04/2022  ? Anxiety and depression 03/04/2022  ? Healthcare maintenance 09/18/2021  ? IBS (irritable bowel syndrome) 07/31/2021  ? Prediabetes 12/13/2017  ? Hyperlipidemia 12/04/2016  ? Gastroesophageal reflux disease 09/19/2012  ? Essential hypertension 07/25/2012  ? Hypothyroidism 07/25/2012  ? ? ?Social Hx   ?Social History  ? ?Socioeconomic History  ? Marital status: Married  ?  Spouse name: Not on file  ? Number of children: Not on file  ? Years of education: 58  ? Highest education level: Not on file  ?Occupational History  ? Not on file  ?Tobacco Use  ? Smoking status: Former  ?  Packs/day: 1.50  ?  Years: 27.00  ?  Pack years: 40.50  ?  Types: Cigarettes   ?  Start date: 71  ?  Quit date: 2000  ?  Years since quitting: 23.2  ? Smokeless tobacco: Never  ?Vaping Use  ? Vaping Use: Never used  ?Substance and Sexual Activity  ? Alcohol use: No  ? Drug use: No  ? Sexual activity: Not on file  ?Other Topics Concern  ? Not on file  ?Social History Narrative  ? Not on file  ? ?Social Determinants of Health  ? ?Financial Resource Strain: Not on file  ?Food Insecurity: Not on file  ?Transportation Needs: Not on file  ?Physical Activity: Not on file  ?Stress: Not on file  ?Social Connections: Not on file  ? ? ?Review of Systems  ?Constitutional: Negative.   ?Respiratory: Negative.    ?Cardiovascular: Negative.   ? ?Objective:  ?BP (!) 142/86   Pulse 95   Temp 97.7 ?F (36.5 ?C)   Wt 174 lb 9.6 oz (79.2 kg)   SpO2 95%   BMI 31.93 kg/m?  ? ? ?  03/04/2022  ?  1:21 PM 01/21/2022  ? 11:39 AM 01/21/2022  ? 11:20 AM  ?BP/Weight  ?Systolic BP 142 100 110  ?Diastolic BP 86 58 71  ?Wt. (Lbs) 174.6    ?BMI 31.93 kg/m2    ? ? ?Physical Exam ?Vitals and nursing note reviewed.  ?Constitutional:   ?   General: She is not in acute distress. ?   Appearance: Normal  appearance. She is not ill-appearing.  ?HENT:  ?   Head: Normocephalic and atraumatic.  ?   Right Ear: Tympanic membrane normal.  ?   Left Ear: Tympanic membrane normal.  ?Eyes:  ?   General:     ?   Right eye: No discharge.     ?   Left eye: No discharge.  ?   Conjunctiva/sclera: Conjunctivae normal.  ?Cardiovascular:  ?   Rate and Rhythm: Normal rate and regular rhythm.  ?Pulmonary:  ?   Effort: Pulmonary effort is normal.  ?   Breath sounds: Normal breath sounds. No wheezing, rhonchi or rales.  ?Neurological:  ?   Mental Status: She is alert.  ?Psychiatric:     ?   Mood and Affect: Mood normal.     ?   Behavior: Behavior normal.  ? ? ?Lab Results  ?Component Value Date  ? WBC 9.0 01/05/2022  ? HGB 15.0 01/05/2022  ? HCT 46.3 (H) 01/05/2022  ? PLT 192 01/05/2022  ? GLUCOSE 95 01/09/2022  ? CHOL 189 06/16/2021  ? TRIG 129  06/16/2021  ? HDL 58 06/16/2021  ? LDLCALC 108 (H) 06/16/2021  ? ALT 24 06/16/2021  ? AST 19 06/16/2021  ? NA 138 01/09/2022  ? K 4.2 01/09/2022  ? CL 106 01/09/2022  ? CREATININE 0.82 01/09/2022  ? BUN 18 01/09/2022  ? CO2 24 01/09/2022  ? TSH 1.740 08/29/2021  ? INR 0.9 10/21/2007  ? HGBA1C 5.8 (H) 06/16/2021  ? ? ? ?Assessment & Plan:  ? ?Problem List Items Addressed This Visit   ? ?  ? Cardiovascular and Mediastinum  ? Essential hypertension  ?  Continue losartan. ?  ?  ? CAD (coronary artery disease)  ?  Advised to stay compliant with Crestor as prescribed. ?  ?  ?  ? Endocrine  ? Hypothyroidism  ?  Advised to take Synthroid first thing in the morning on empty stomach with water.  Continue current dosing.  Needs TSH at next visit. ?  ?  ? Relevant Medications  ? levothyroxine (SYNTHROID) 137 MCG tablet  ?  ? Other  ? Anxiety and depression  ?  Stable.  Continue Zoloft.  Refilled today. ?  ?  ? Relevant Medications  ? sertraline (ZOLOFT) 100 MG tablet  ? ? ?Meds ordered this encounter  ?Medications  ? sertraline (ZOLOFT) 100 MG tablet  ?  Sig: TAKE 1 AND 1/2 TABLETS BY MOUTH ONCE DAILY.  ?  Dispense:  135 tablet  ?  Refill:  3  ? levothyroxine (SYNTHROID) 137 MCG tablet  ?  Sig: TAKE (1) TABLET BY MOUTH ONCE DAILY BEFORE BREAKFAST.  ?  Dispense:  90 tablet  ?  Refill:  3  ? ? ?Follow-up:  Return in about 6 months (around 09/04/2022). ? ?Everlene Other DO ?[facility] ? ?

## 2022-03-04 NOTE — Assessment & Plan Note (Signed)
Advised to stay compliant with Crestor as prescribed. ?

## 2022-03-04 NOTE — Assessment & Plan Note (Signed)
Stable.  Continue Zoloft.  Refilled today. 

## 2022-03-04 NOTE — Assessment & Plan Note (Signed)
Continue losartan. 

## 2022-03-04 NOTE — Patient Instructions (Addendum)
Be sure to take your Synthroid first thing in the morning on an empty stomach with a full glass of water.  No medications or foods/beverages at the time he takes Synthroid. ? ?Singulair at night. ? ?Protonix before meal. ? ?The rest of your medication be taken in the morning just not with the Synthroid. ? ? ?Follow up in 6 months or sooner if needed. ?

## 2022-03-04 NOTE — Assessment & Plan Note (Signed)
Advised to take Synthroid first thing in the morning on empty stomach with water.  Continue current dosing.  Needs TSH at next visit. ?

## 2022-03-10 ENCOUNTER — Ambulatory Visit (HOSPITAL_COMMUNITY): Payer: PPO

## 2022-03-10 ENCOUNTER — Telehealth (HOSPITAL_COMMUNITY): Payer: Self-pay

## 2022-03-10 NOTE — Telephone Encounter (Signed)
Called patient regarding no show for today's appointment. Patient reports that she did call and leave a message on the voicemail for clinic ~20 minutes prior to session. She is experiencing vertigo and did not feel safe to drive. She would like to reschedule this missed appointment if possible and was placed on the waitlist.  ? ?Limmie Patricia, OTR/L,CBIS  ?[phone] ? ? ?

## 2022-03-12 ENCOUNTER — Encounter (HOSPITAL_COMMUNITY): Payer: PPO | Admitting: Occupational Therapy

## 2022-03-17 ENCOUNTER — Encounter (HOSPITAL_COMMUNITY): Payer: Self-pay

## 2022-03-17 ENCOUNTER — Ambulatory Visit (HOSPITAL_COMMUNITY): Payer: PPO | Attending: Orthopedic Surgery

## 2022-03-17 DIAGNOSIS — M25611 Stiffness of right shoulder, not elsewhere classified: Secondary | ICD-10-CM | POA: Diagnosis not present

## 2022-03-17 DIAGNOSIS — M25511 Pain in right shoulder: Secondary | ICD-10-CM | POA: Insufficient documentation

## 2022-03-17 DIAGNOSIS — R29898 Other symptoms and signs involving the musculoskeletal system: Secondary | ICD-10-CM | POA: Insufficient documentation

## 2022-03-17 NOTE — Therapy (Signed)
?OUTPATIENT OCCUPATIONAL THERAPY TREATMENT NOTE ? ? ?Patient Name: [name] ?MRN: [id number] ?DOB:03-Mar-1956, 66 y.o., female ?Today's Date: 03/17/2022 ? ?PCP: Coral Spikes, DO ?REFERRING PROVIDER: Dr. Victorino December ? ? OT End of Session - 03/17/22 1539   ? ? Visit Number 4   ? Number of Visits 24   ? Date for OT Re-Evaluation 05/20/22   mini reassess:03/25/22  ? Authorization Type [employer]   ? Authorization Time Period $15 copay no visit limit no auth needed.   ? Progress Note Due on Visit 10   ? OT Start Time 1515   ? OT Stop Time 3557   ? OT Time Calculation (min) 38 min   ? Activity Tolerance Patient tolerated treatment well   ? Behavior During Therapy [facility] for tasks assessed/performed   ? ?  ?  ? ?  ? ? ? ? ?Past Medical History:  ?Diagnosis Date  ? Anxiety   ? Arthritis   ? Depression   ? GERD (gastroesophageal reflux disease)   ? Hypertension   ? Hypothyroid   ? Hypothyroidism   ? Moderate persistent asthma 01/20/2021  ? PONV (postoperative nausea and vomiting)   ? Recurrent upper respiratory infection (URI)   ? Urticaria   ? ?Past Surgical History:  ?Procedure Laterality Date  ? BACK SURGERY    ? BIOPSY  06/03/2020  ? Procedure: BIOPSY;  Surgeon: Rogene Houston, MD;  [facility];  Service: Endoscopy;;  ? CARPAL TUNNEL RELEASE  06/16/2012  ? Procedure: CARPAL TUNNEL RELEASE;  Surgeon: Roseanne Kaufman, MD;  Location: [place];  Service: Orthopedics;  Laterality: Right;  limited open carpal tunnel release  ? CERVICAL FUSION  2018  ? CHOLECYSTECTOMY    ? COLONOSCOPY    ? COLONOSCOPY WITH PROPOFOL N/A 06/03/2020  ? Procedure: COLONOSCOPY WITH PROPOFOL;  Surgeon: Rogene Houston, MD;  [facility];  Service: Endoscopy;  Laterality: N/A;  115  ? ESOPHAGOGASTRODUODENOSCOPY (EGD) WITH PROPOFOL N/A 06/03/2020  ? Procedure: ESOPHAGOGASTRODUODENOSCOPY (EGD) WITH PROPOFOL;  Surgeon: Rogene Houston, MD;  [facility];  Service: Endoscopy;  Laterality: N/A;   ? TONSILLECTOMY    ? TRIGGER FINGER RELEASE  06/16/2012  ? Procedure: RELEASE TRIGGER FINGER/A-1 PULLEY;  Surgeon: Roseanne Kaufman, MD;  Location: [place];  Service: Orthopedics;  Laterality: Right;  right middle a-1 release  ? TUBAL LIGATION    ? UPPER GASTROINTESTINAL ENDOSCOPY    ? VAGINAL HYSTERECTOMY    ? ?Patient Active Problem List  ? Diagnosis Date Noted  ? Asthma 03/04/2022  ? CAD (coronary artery disease) 03/04/2022  ? Anxiety and depression 03/04/2022  ? Healthcare maintenance 09/18/2021  ? IBS (irritable bowel syndrome) 07/31/2021  ? Prediabetes 12/13/2017  ? Hyperlipidemia 12/04/2016  ? Gastroesophageal reflux disease 09/19/2012  ? Essential hypertension 07/25/2012  ? Hypothyroidism 07/25/2012  ? ? ?ONSET DATE: 02/11/22 ? ?REFERRING DIAG: right shoulder shoulder scope RCR, DCR, SAD ? ?THERAPY DIAG:  ?Acute pain of right shoulder ? ?Other symptoms and signs involving the musculoskeletal system ? ?Stiffness of right shoulder, not elsewhere classified ? ? ?PERTINENT HISTORY: Patient underwent surgery to right shoulder shoulder scope RCR, DCR, SAD completed on 02/11/22. Follow up appt: 03/25/22 ? ?PRECAUTIONS: Shoulder see media tab for protocol. Sling on at all times except to bath and complete HEP. ? ?SUBJECTIVE: S: I like the ice better.  ? ?PAIN:  ?Are you having pain? Yes: NPRS scale: 6/10 ?Pain location: right shoulder ?  Pain description: sore ?Aggravating factors: doing too much  ?Relieving factors: heat, ice ? ? ? ? ?OBJECTIVE: From Evaluation: ? ?UE ROM    ?  ?Passive ROM Right ?02/25/2022  ?Shoulder flexion 105  ?Shoulder abduction 60  ?Shoulder internal rotation 90 adducted  ?Shoulder external rotation 25 adducted  ?(Blank rows = not tested) ?  ?A/ROM not assessed due to protocol. ?Active ROM Right ?   ?Shoulder flexion    ?Shoulder abduction    ?Shoulder internal rotation    ?Shoulder external rotation    ?(Blank rows = not tested) ?  ?  ?  ?UE MMT:    ?  ?Strength not assessed due to  protocol. ?MMT Right ?   ?Shoulder flexion    ?Shoulder adduction    ?Shoulder internal rotation    ?Shoulder external rotation    ?(Blank rows = not tested) ? ? ?SHORT TERM GOALS: Target date: 04/08/2022 ?  ?Patient will be educated and independent with HEP in order to facilitate progress in therapy and begin to utilize her RUE for basic ADL tasks.  ?  ?Goal status: Ongoing ?  ?2.  Pt will increase her RUE P/ROM to [facility] in order to increase ability to complete upper body dressing tasks.  ?  ?Goal status: Ongoing ?  ?3.  Pt will increase her RUE strength to 3/5 while beginning to use her RUE to complete reaching tasks to at least shoulder height. ?  ?Goal status: Ongoing ?  ?4.  Pt will report a pain level of no more than 5/10 when utilizing her RUE to complete basic self care tasks such as bathing, dressing, and grooming.  ?  ?Goal status:Ongoing ?  ?5.  Pt will decrease her RUE fascial restrictions to moderate amount in order to increase the functional mobility needed to complete low level reaching tasks.  ?  ?Goal status: Ongoing ?  ?  ?  ?  ?LONG TERM GOALS: Target date: 05/20/2022 ?  ?Pt will increase her RUE A/ROM to WNL in order to complete any high level reaching tasks, behind her back, or behind her head with less difficulty.  ?  ?Goal status: Ongoing ?  ?2.  Pt will increase her RUE strength to 4/5 or better in order return to performing housekeeping and meal prep tasks as desired.  ?  ?Goal status: Ongoing ?  ?3.  Pt will report a pain level of 3/10 or less when using her RUE to complete all daily tasks.  ?  ?Goal status: Ongoing ?  ?4.  Pt will decrease her RUE fascial restrictions to minimal amount or less in order to increase the functional mobility needed to complete all reaching tasks.  ?  ?Goal status: Ongoing ?  ? ?TODAY'S TREATMENT: ?03/17/22 ?-Myofascial release: completed separately from therapeutic exercises. Myofascial release to right upper arm, anterior shoulder, and trapezius regions to decrease  pain and fascial restrictions and increase joint ROM.  ?-P/ROM: Supine,shoulder, all ranges, 10X each ?- AA/ROM: supine, shoulder, protraction, IR/er, flexion 10X ?- Counter wash: standing, flexion, horizontal abduction/adduction, circles (both directions) 10X ?-pulleys: 1' flexion 1' abduction ? ? ? ? ?03/03/22 ?-P/ROM: shoulder, all ranges, 10X each ?-Isometrics: 3x5", all planes ?-Scapular A/ROM: row, extension, scapular depression, 12X each ?-Therapy ball stretches: flexion, abduction, 10X each with 2" hold at end ROM ?-low level thumb tacks: 1'  ?-prot/ret/elev/dep: 1'  ?-pulleys: 1' flexion 1' abduction ? ? ? ? ?PATIENT EDUCATION: ?Education details: Supine shoulder AA/ROM: flexion, protraction, IR/er ?Person educated:  Patient ?Education method: Explanation, Demonstration, Verbal cues, and Handouts ?Education comprehension: verbalized understanding and returned demonstration ?  ?  ?HOME EXERCISE PROGRAM: ?Eval: table slides, A/ROM wrist and elbow 4/11: AA/ROM supine shoulder flexion, IR/er, protraction ? ?   ?OT Assessment and Plan  ?Clinical Impression Statement A: Continued with myofascial release to RUE today. Completed P/ROM to RUE, Verbal cuing for form and technique. Completed some supine AA/ROM as patient is now 5 weeks post op. Reviewed precautions.  ?Plan P: Take updated measurements to MD appointment on 03/25/22. Continue with AA/ROM supine, progressing as pain allows.   ?Consulted and Agree with Plan of Care Patient  ? ? ? ? ? ?Ailene Ravel, OTR/L,CBIS  ?[phone] ? ?03/17/2022, 4:11 PM ? ?  ? ? ? ?

## 2022-03-17 NOTE — Patient Instructions (Signed)
Perform each exercise ____10-15____ reps. 2 x days. (Complete laying down) ? ?1) Protraction  ? ?Start by holding a wand or cane at chest height. ? ?Next, slowly push the wand outwards in front of your body so that your elbows become fully straightened. Then, return to the original position.  ? ? ? ?2) Shoulder FLEXION  ? ?In the standing position, hold a wand/cane with both arms, palms down on both sides. Raise up the wand/cane allowing your unaffected arm to perform most of the effort. Your affected arm should be partially relaxed.   ? ? ? ?3) Internal/External ROTATION  ? ?In the standing position, hold a wand/cane with both hands keeping your elbows bent. Move your arms and wand/cane to one side.  Your affected arm should be partially relaxed while your unaffected arm performs most of the effort.   ? ? ? ? ? ? ?

## 2022-03-19 ENCOUNTER — Encounter (HOSPITAL_COMMUNITY): Payer: PPO | Admitting: Occupational Therapy

## 2022-03-20 ENCOUNTER — Encounter (HOSPITAL_COMMUNITY): Payer: PPO | Admitting: Occupational Therapy

## 2022-03-25 ENCOUNTER — Encounter (HOSPITAL_COMMUNITY): Payer: PPO

## 2022-03-26 ENCOUNTER — Encounter (HOSPITAL_COMMUNITY): Payer: PPO

## 2022-03-31 ENCOUNTER — Encounter (HOSPITAL_COMMUNITY): Payer: PPO

## 2022-04-01 ENCOUNTER — Encounter (HOSPITAL_COMMUNITY): Payer: PPO

## 2022-04-07 ENCOUNTER — Encounter (HOSPITAL_COMMUNITY): Payer: PPO

## 2022-04-09 ENCOUNTER — Encounter (HOSPITAL_COMMUNITY): Payer: PPO | Admitting: Occupational Therapy

## 2022-04-14 ENCOUNTER — Encounter (HOSPITAL_COMMUNITY): Payer: PPO | Admitting: Occupational Therapy

## 2022-04-16 ENCOUNTER — Encounter (HOSPITAL_COMMUNITY): Payer: PPO | Admitting: Occupational Therapy

## 2022-04-21 ENCOUNTER — Encounter (HOSPITAL_COMMUNITY): Payer: PPO | Admitting: Occupational Therapy

## 2022-04-23 ENCOUNTER — Encounter (HOSPITAL_COMMUNITY): Payer: PPO

## 2022-04-28 ENCOUNTER — Ambulatory Visit (HOSPITAL_COMMUNITY): Payer: PPO | Admitting: Occupational Therapy

## 2022-04-29 ENCOUNTER — Encounter (HOSPITAL_COMMUNITY): Payer: Self-pay | Admitting: Occupational Therapy

## 2022-04-29 NOTE — Therapy (Signed)
[place], [place], [address] Phone: [phone]   Fax:  [phone]  Patient Details  Name: JOANE POSTEL MRN: [id number] Date of Birth: 1956/04/20 Referring Provider:  No ref. provider found  Encounter Date: 04/29/2022   OCCUPATIONAL THERAPY DISCHARGE SUMMARY  Visits from Start of Care: 4  Current functional level related to goals / functional outcomes: Unknown. Pt called to cancel all remaining appts as she reports the MD said she could continue with HEP at home.    Remaining deficits: unknown   Education / Equipment: HEP   Patient agrees to discharge. Patient goals were not met. Patient is being discharged due to the physician's request..     Guadelupe Sabin, OTR/L  [phone] 04/29/2022, 8:46 AM  [place], [place], [address] Phone: [phone]   Fax:  [phone]

## 2022-04-30 ENCOUNTER — Ambulatory Visit (HOSPITAL_COMMUNITY): Payer: PPO

## 2022-05-05 ENCOUNTER — Encounter (HOSPITAL_COMMUNITY): Payer: PPO | Admitting: Occupational Therapy

## 2022-05-07 ENCOUNTER — Encounter (HOSPITAL_COMMUNITY): Payer: PPO | Admitting: Occupational Therapy

## 2022-05-12 ENCOUNTER — Encounter (HOSPITAL_COMMUNITY): Payer: PPO | Admitting: Occupational Therapy

## 2022-05-14 ENCOUNTER — Encounter (HOSPITAL_COMMUNITY): Payer: PPO | Admitting: Occupational Therapy

## 2022-05-14 ENCOUNTER — Ambulatory Visit (INDEPENDENT_AMBULATORY_CARE_PROVIDER_SITE_OTHER): Payer: PPO | Admitting: Family Medicine

## 2022-05-14 ENCOUNTER — Encounter: Payer: Self-pay | Admitting: Family Medicine

## 2022-05-14 ENCOUNTER — Ambulatory Visit (HOSPITAL_COMMUNITY)
Admission: RE | Admit: 2022-05-14 | Discharge: 2022-05-14 | Disposition: A | Discharge status: Home / Self Care | Payer: PPO | Source: Ambulatory Visit | Attending: Family Medicine | Admitting: Family Medicine

## 2022-05-14 VITALS — BP 151/75 | HR 68 | Temp 97.9°F | Ht 62.0 in | Wt 171.0 lb

## 2022-05-14 DIAGNOSIS — E7849 Other hyperlipidemia: Secondary | ICD-10-CM

## 2022-05-14 DIAGNOSIS — R5383 Other fatigue: Secondary | ICD-10-CM

## 2022-05-14 DIAGNOSIS — M159 Polyosteoarthritis, unspecified: Secondary | ICD-10-CM

## 2022-05-14 DIAGNOSIS — I10 Essential (primary) hypertension: Secondary | ICD-10-CM | POA: Diagnosis not present

## 2022-05-14 DIAGNOSIS — E038 Other specified hypothyroidism: Secondary | ICD-10-CM

## 2022-05-14 DIAGNOSIS — M1812 Unilateral primary osteoarthritis of first carpometacarpal joint, left hand: Secondary | ICD-10-CM | POA: Diagnosis not present

## 2022-05-14 DIAGNOSIS — M19041 Primary osteoarthritis, right hand: Secondary | ICD-10-CM | POA: Diagnosis not present

## 2022-05-14 NOTE — Patient Instructions (Signed)
Xray and labs today.  We will call with the results.  Take care  Dr. Adriana Simas

## 2022-05-15 ENCOUNTER — Encounter: Payer: Self-pay | Admitting: Family Medicine

## 2022-05-15 DIAGNOSIS — M159 Polyosteoarthritis, unspecified: Secondary | ICD-10-CM | POA: Insufficient documentation

## 2022-05-15 DIAGNOSIS — R5383 Other fatigue: Secondary | ICD-10-CM | POA: Insufficient documentation

## 2022-05-15 LAB — LIPID PANEL
Chol/HDL Ratio: 2.3 ratio (ref 0.0–4.4)
Cholesterol, Total: 141 mg/dL (ref 100–199)
HDL: 62 mg/dL (ref >39)
LDL Chol Calc (NIH): 64 mg/dL (ref 0–99)
Triglycerides: 77 mg/dL (ref 0–149)
VLDL Cholesterol Cal: 15 mg/dL (ref 5–40)

## 2022-05-15 LAB — CBC
Hematocrit: 44 % (ref 34.0–46.6)
Hemoglobin: 15 g/dL (ref 11.1–15.9)
MCH: 31.6 pg (ref 26.6–33.0)
MCHC: 34.1 g/dL (ref 31.5–35.7)
MCV: 93 fL (ref 79–97)
Platelets: 217 10*3/uL (ref 150–450)
RBC: 4.74 x10E6/uL (ref 3.77–5.28)
RDW: 12.5 % (ref 11.7–15.4)
WBC: 7.1 10*3/uL (ref 3.4–10.8)

## 2022-05-15 LAB — CMP14+EGFR
ALT: 22 IU/L (ref 0–32)
AST: 21 IU/L (ref 0–40)
Albumin/Globulin Ratio: 1.4 (ref 1.2–2.2)
Albumin: 4.4 g/dL (ref 3.8–4.8)
Alkaline Phosphatase: 74 IU/L (ref 44–121)
BUN/Creatinine Ratio: 13 (ref 12–28)
BUN: 10 mg/dL (ref 8–27)
Bilirubin Total: 0.3 mg/dL (ref 0.0–1.2)
CO2: 21 mmol/L (ref 20–29)
Calcium: 9.8 mg/dL (ref 8.7–10.3)
Chloride: 102 mmol/L (ref 96–106)
Creatinine, Ser: 0.76 mg/dL (ref 0.57–1.00)
Globulin, Total: 3.1 g/dL (ref 1.5–4.5)
Glucose: 96 mg/dL (ref 70–99)
Potassium: 4.5 mmol/L (ref 3.5–5.2)
Sodium: 140 mmol/L (ref 134–144)
Total Protein: 7.5 g/dL (ref 6.0–8.5)
eGFR: 87 mL/min/{1.73_m2} (ref >59)

## 2022-05-15 LAB — VITAMIN B12: Vitamin B-12: 443 pg/mL (ref 232–1245)

## 2022-05-15 LAB — TSH: TSH: 0.485 u[IU]/mL (ref 0.450–4.500)

## 2022-05-15 NOTE — Assessment & Plan Note (Signed)
X-rays of the hands were obtained and revealed osteoarthritis.  The left hand has significant involvement of the [facility] joint. This is not particularly troublesome for her.  She is most bothered by the thumb on the right hand. Supportive care.  Patient may see a hand surgeon if she desires.

## 2022-05-15 NOTE — Assessment & Plan Note (Signed)
Labs for further evaluation today. °

## 2022-05-15 NOTE — Assessment & Plan Note (Signed)
Well-controlled.  At goal.  Continue Crestor.

## 2022-05-15 NOTE — Progress Notes (Signed)
Subjective:  Patient ID: [name], female    DOB: [birth year]  Age: 65 y.o. MRN: [id number]  CC: Chief Complaint  Patient presents with   Osteoarthritis    Worse in hands and feet , but present allover multi joints     HPI:  66 year old female with nonobstructive CAD, hypertension, hypothyroidism, anxiety and depression, hyperlipidemia, prediabetes presents for evaluation of the above.  Patient states that she has had worsening pain in her hands and feet over the past several months.  Patient has known osteoarthritis of several joints of the hand.  She is concerned that she may need surgery.  She states that it is bothering her significantly and interfering with her ability to do certain tasks.  Patient also reports ongoing fatigue.  She is concerned she may have vitamin B12 deficiency.  Patient's blood pressure is elevated today.  Patient needs labs today.  Patient Active Problem List   Diagnosis Date Noted   Primary osteoarthritis involving multiple joints 05/15/2022   Fatigue 05/15/2022   Asthma 03/04/2022   CAD (coronary artery disease) 03/04/2022   Anxiety and depression 03/04/2022   Healthcare maintenance 09/18/2021   IBS (irritable bowel syndrome) 07/31/2021   Prediabetes 12/13/2017   Hyperlipidemia 12/04/2016   Gastroesophageal reflux disease 09/19/2012   Essential hypertension 07/25/2012   Hypothyroidism 07/25/2012    Social Hx   Social History   Socioeconomic History   Marital status: Married    Spouse name: Not on file   Number of children: Not on file   Years of education: 12   Highest education level: Not on file  Occupational History   Not on file  Tobacco Use   Smoking status: Former    Packs/day: 1.50    Years: 27.00    Total pack years: 40.50    Types: Cigarettes    Start date: 1973    Quit date: 2000    Years since quitting: 23.4   Smokeless tobacco: Never  Vaping Use   Vaping Use: Never used  Substance and Sexual Activity    Alcohol use: No   Drug use: No   Sexual activity: Not on file  Other Topics Concern   Not on file  Social History Narrative   Not on file   Social Determinants of Health   Financial Resource Strain: Not on file  Food Insecurity: Not on file  Transportation Needs: Not on file  Physical Activity: Not on file  Stress: Not on file  Social Connections: Not on file    Review of Systems Per HPI  Objective:  BP (!) 151/75   Pulse 68   Temp 97.9 F (36.6 C)   Ht $R'5\' 2"'pQ$  (1.575 m)   Wt 171 lb (77.6 kg)   SpO2 96%   BMI 31.28 kg/m      05/14/2022   11:05 AM 03/04/2022    1:21 PM 01/21/2022   11:39 AM  BP/Weight  Systolic BP 811 914 782  Diastolic BP 75 86 58  Wt. (Lbs) 171 174.6   BMI 31.28 kg/m2 31.93 kg/m2     Physical Exam Vitals and nursing note reviewed.  Constitutional:      General: She is not in acute distress.    Appearance: Normal appearance. She is not ill-appearing.  HENT:     Head: Normocephalic and atraumatic.  Cardiovascular:     Rate and Rhythm: Normal rate and regular rhythm.     Heart sounds: No murmur heard. Pulmonary:     Effort:  Pulmonary effort is normal.     Breath sounds: Normal breath sounds. No wheezing, rhonchi or rales.  Musculoskeletal:     Comments: Patient with multiple joints affected of the hands (predominantly of the DIP joints as well as the CMC joints).  Neurological:     Mental Status: She is alert.  Psychiatric:        Mood and Affect: Mood normal.        Behavior: Behavior normal.     Lab Results  Component Value Date   WBC 7.1 05/14/2022   HGB 15.0 05/14/2022   HCT 44.0 05/14/2022   PLT 217 05/14/2022   GLUCOSE 96 05/14/2022   CHOL 141 05/14/2022   TRIG 77 05/14/2022   HDL 62 05/14/2022   LDLCALC 64 05/14/2022   ALT 22 05/14/2022   AST 21 05/14/2022   NA 140 05/14/2022   K 4.5 05/14/2022   CL 102 05/14/2022   CREATININE 0.76 05/14/2022   BUN 10 05/14/2022   CO2 21 05/14/2022   TSH 0.485 05/14/2022   INR 0.9  10/21/2007   HGBA1C 5.8 (H) 06/16/2021     Assessment & Plan:   Problem List Items Addressed This Visit       Cardiovascular and Mediastinum   Essential hypertension   Relevant Orders   CMP14+EGFR (Completed)     Endocrine   Hypothyroidism    TSH has returned and is within normal limits.  Continue current dosing of Synthroid.      Relevant Orders   TSH (Completed)     Musculoskeletal and Integument   Primary osteoarthritis involving multiple joints - Primary    X-rays of the hands were obtained and revealed osteoarthritis.  The left hand has significant involvement of the [facility] joint. This is not particularly troublesome for her.  She is most bothered by the thumb on the right hand. Supportive care.  Patient may see a hand surgeon if she desires.      Relevant Orders   DG Hand Complete Left (Completed)   DG Hand Complete Right (Completed)     Other   Fatigue    Labs for further evaluation today.      Relevant Orders   CBC (Completed)   Vitamin B12 (Completed)   Hyperlipidemia    Well-controlled.  At goal.  Continue Crestor.      Relevant Orders   Lipid Panel (Completed)    Thersa Salt DO [place]

## 2022-05-15 NOTE — Assessment & Plan Note (Signed)
TSH has returned and is within normal limits.  Continue current dosing of Synthroid.

## 2022-05-19 ENCOUNTER — Encounter (HOSPITAL_COMMUNITY): Payer: PPO | Admitting: Occupational Therapy

## 2022-05-26 ENCOUNTER — Other Ambulatory Visit: Payer: Self-pay | Admitting: Family Medicine

## 2022-05-26 ENCOUNTER — Telehealth: Payer: Self-pay | Admitting: *Deleted

## 2022-05-26 MED ORDER — MELOXICAM 15 MG PO TABS: 15.0000 mg | ORAL_TABLET | Freq: Every day | ORAL | 0 refills | Status: DC | PRN

## 2022-05-26 NOTE — Telephone Encounter (Signed)
Patient called and wanted to know if something can be called in for the pain from the arthritis in her hands. Patient states her hands are really bothering her   [address]

## 2022-05-26 NOTE — Telephone Encounter (Signed)
Cook, Jayce G, DO   ? ?Rx sent.   ? ?

## 2022-05-26 NOTE — Telephone Encounter (Signed)
Patient notified

## 2022-05-27 ENCOUNTER — Ambulatory Visit (INDEPENDENT_AMBULATORY_CARE_PROVIDER_SITE_OTHER): Payer: PPO | Admitting: Family Medicine

## 2022-05-27 ENCOUNTER — Ambulatory Visit (HOSPITAL_COMMUNITY)
Admission: RE | Admit: 2022-05-27 | Discharge: 2022-05-27 | Disposition: A | Discharge status: Home / Self Care | Payer: PPO | Source: Ambulatory Visit | Attending: Family Medicine | Admitting: Family Medicine

## 2022-05-27 VITALS — BP 140/98 | HR 88 | Temp 97.9°F | Ht 62.0 in | Wt 173.0 lb

## 2022-05-27 DIAGNOSIS — R509 Fever, unspecified: Secondary | ICD-10-CM | POA: Diagnosis not present

## 2022-05-27 DIAGNOSIS — R051 Acute cough: Secondary | ICD-10-CM | POA: Insufficient documentation

## 2022-05-27 DIAGNOSIS — R059 Cough, unspecified: Secondary | ICD-10-CM | POA: Diagnosis not present

## 2022-05-27 MED ORDER — PROMETHAZINE-DM 6.25-15 MG/5ML PO SYRP: 5.0000 mL | ORAL_SOLUTION | Freq: Four times a day (QID) | ORAL | 0 refills | Status: DC | PRN

## 2022-05-27 NOTE — Progress Notes (Signed)
Subjective:  Patient ID: [name], female    DOB: Oct 11, 1956  Age: 66 y.o. MRN: [id number]  CC: Chief Complaint  Patient presents with   Pain    Pain and soreness all over body x 3 days, had a fever yesterday , ear pain left ear is worse    Headache   Cough    Dry     HPI:  66 year old female presents for evaluation of the above.  Patient states that she has been sick for the past 3 days.  Initially started with body aches.  Subsequently had fever, Tmax 100.  She states that she has had sore throat dry cough, headache, and left ear pain as well.  She has recently traveled to the beach.  No reported sick contacts.  She has taken Tylenol with improvement in fever.  No other associated symptoms.  No other complaints.  Patient Active Problem List   Diagnosis Date Noted   Acute cough 05/27/2022   Primary osteoarthritis involving multiple joints 05/15/2022   Fatigue 05/15/2022   Asthma 03/04/2022   CAD (coronary artery disease) 03/04/2022   Anxiety and depression 03/04/2022   Healthcare maintenance 09/18/2021   IBS (irritable bowel syndrome) 07/31/2021   Prediabetes 12/13/2017   Hyperlipidemia 12/04/2016   Gastroesophageal reflux disease 09/19/2012   Essential hypertension 07/25/2012   Hypothyroidism 07/25/2012    Social Hx   Social History   Socioeconomic History   Marital status: Married    Spouse name: Not on file   Number of children: Not on file   Years of education: 12   Highest education level: Not on file  Occupational History   Not on file  Tobacco Use   Smoking status: Former    Packs/day: 1.50    Years: 27.00    Total pack years: 40.50    Types: Cigarettes    Start date: 1973    Quit date: 2000    Years since quitting: 23.4   Smokeless tobacco: Never  Vaping Use   Vaping Use: Never used  Substance and Sexual Activity   Alcohol use: No   Drug use: No   Sexual activity: Not on file  Other Topics Concern   Not on file  Social History  Narrative   Not on file   Social Determinants of Health   Financial Resource Strain: Not on file  Food Insecurity: Not on file  Transportation Needs: Not on file  Physical Activity: Not on file  Stress: Not on file  Social Connections: Not on file    Review of Systems Per HPI  Objective:  BP (!) 140/98   Pulse 88   Temp 97.9 F (36.6 C)   Ht 5\' 2"  (1.575 m)   Wt 173 lb (78.5 kg)   SpO2 95%   BMI 31.64 kg/m      05/27/2022   11:07 AM 05/14/2022   11:05 AM 03/04/2022    1:21 PM  BP/Weight  Systolic BP 140 151 142  Diastolic BP 98 75 86  Wt. (Lbs) 173 171 174.6  BMI 31.64 kg/m2 31.28 kg/m2 31.93 kg/m2    Physical Exam Vitals and nursing note reviewed.  Constitutional:      General: She is not in acute distress.    Appearance: Normal appearance. She is not ill-appearing.  HENT:     Head: Normocephalic and atraumatic.     Right Ear: Tympanic membrane normal.     Left Ear: Tympanic membrane normal.     Mouth/Throat:  Pharynx: Oropharynx is clear.  Cardiovascular:     Rate and Rhythm: Normal rate and regular rhythm.  Pulmonary:     Effort: Pulmonary effort is normal.     Breath sounds: Normal breath sounds. No wheezing or rales.  Neurological:     Mental Status: She is alert.     Lab Results  Component Value Date   WBC 7.1 05/14/2022   HGB 15.0 05/14/2022   HCT 44.0 05/14/2022   PLT 217 05/14/2022   GLUCOSE 96 05/14/2022   CHOL 141 05/14/2022   TRIG 77 05/14/2022   HDL 62 05/14/2022   LDLCALC 64 05/14/2022   ALT 22 05/14/2022   AST 21 05/14/2022   NA 140 05/14/2022   K 4.5 05/14/2022   CL 102 05/14/2022   CREATININE 0.76 05/14/2022   BUN 10 05/14/2022   CO2 21 05/14/2022   TSH 0.485 05/14/2022   INR 0.9 10/21/2007   HGBA1C 5.8 (H) 06/16/2021     Assessment & Plan:   Problem List Items Addressed This Visit       Other   Acute cough - Primary    Likely viral illness.  Chest x-ray was obtained today and was independently reviewed by me.   Interpretation: Normal chest x-ray.  No evidence of pneumonia.  Promethazine DM for cough.  Awaiting COVID test results.      Relevant Orders   DG Chest 2 View (Completed)   COVID-19, Flu A+B and RSV    Meds ordered this encounter  Medications   promethazine-dextromethorphan (PROMETHAZINE-DM) 6.25-15 MG/5ML syrup    Sig: Take 5 mLs by mouth 4 (four) times daily as needed for cough.    Dispense:  118 mL    Refill:  0   Ellyn Rubiano DO [facility]

## 2022-05-27 NOTE — Assessment & Plan Note (Signed)
Likely viral illness.  Chest x-ray was obtained today and was independently reviewed by me.  Interpretation: Normal chest x-ray.  No evidence of pneumonia.  Promethazine DM for cough.  Awaiting COVID test results.

## 2022-05-27 NOTE — Patient Instructions (Signed)
Rest. Lots of fluids.  Tylenol as needed.  Cough medication as directed.  We will call with results.  Take care  Dr. Adriana Simas

## 2022-05-29 LAB — COVID-19, FLU A+B AND RSV
Influenza A, NAA: NOT DETECTED
Influenza B, NAA: NOT DETECTED
RSV, NAA: NOT DETECTED
SARS-CoV-2, NAA: NOT DETECTED

## 2022-07-23 ENCOUNTER — Ambulatory Visit: Payer: PPO | Admitting: Family Medicine

## 2022-07-29 DIAGNOSIS — M13842 Other specified arthritis, left hand: Secondary | ICD-10-CM | POA: Diagnosis not present

## 2022-07-29 DIAGNOSIS — M13849 Other specified arthritis, unspecified hand: Secondary | ICD-10-CM | POA: Diagnosis not present

## 2022-07-29 DIAGNOSIS — M1812 Unilateral primary osteoarthritis of first carpometacarpal joint, left hand: Secondary | ICD-10-CM | POA: Insufficient documentation

## 2022-07-29 DIAGNOSIS — M79642 Pain in left hand: Secondary | ICD-10-CM | POA: Diagnosis not present

## 2022-07-29 DIAGNOSIS — R52 Pain, unspecified: Secondary | ICD-10-CM | POA: Diagnosis not present

## 2022-08-11 DIAGNOSIS — L718 Other rosacea: Secondary | ICD-10-CM | POA: Diagnosis not present

## 2022-08-17 ENCOUNTER — Other Ambulatory Visit (HOSPITAL_COMMUNITY): Payer: Self-pay | Admitting: Family Medicine

## 2022-08-17 ENCOUNTER — Ambulatory Visit (HOSPITAL_COMMUNITY)
Admission: RE | Admit: 2022-08-17 | Discharge: 2022-08-17 | Disposition: A | Discharge status: Home / Self Care | Payer: PPO | Source: Ambulatory Visit | Attending: Family Medicine | Admitting: Family Medicine

## 2022-08-17 DIAGNOSIS — Z1231 Encounter for screening mammogram for malignant neoplasm of breast: Secondary | ICD-10-CM

## 2022-08-24 ENCOUNTER — Ambulatory Visit (INDEPENDENT_AMBULATORY_CARE_PROVIDER_SITE_OTHER): Payer: PPO | Admitting: Family Medicine

## 2022-08-24 ENCOUNTER — Encounter: Payer: Self-pay | Admitting: Family Medicine

## 2022-08-24 VITALS — BP 132/78 | HR 65 | Temp 97.7°F | Wt 173.6 lb

## 2022-08-24 DIAGNOSIS — R5383 Other fatigue: Secondary | ICD-10-CM | POA: Diagnosis not present

## 2022-08-24 NOTE — Patient Instructions (Signed)
Labs today.  COVID testing today as well.  We will call with the results.  Lots of fluids and rest.  Take care  Dr. Lacinda Axon

## 2022-08-25 LAB — CMP14+EGFR
ALT: 25 IU/L (ref 0–32)
AST: 22 IU/L (ref 0–40)
Albumin/Globulin Ratio: 1.8 (ref 1.2–2.2)
Albumin: 4.7 g/dL (ref 3.9–4.9)
Alkaline Phosphatase: 77 IU/L (ref 44–121)
BUN/Creatinine Ratio: 17 (ref 12–28)
BUN: 16 mg/dL (ref 8–27)
Bilirubin Total: 0.3 mg/dL (ref 0.0–1.2)
CO2: 22 mmol/L (ref 20–29)
Calcium: 10.1 mg/dL (ref 8.7–10.3)
Chloride: 100 mmol/L (ref 96–106)
Creatinine, Ser: 0.96 mg/dL (ref 0.57–1.00)
Globulin, Total: 2.6 g/dL (ref 1.5–4.5)
Glucose: 94 mg/dL (ref 70–99)
Potassium: 4.7 mmol/L (ref 3.5–5.2)
Sodium: 138 mmol/L (ref 134–144)
Total Protein: 7.3 g/dL (ref 6.0–8.5)
eGFR: 66 mL/min/{1.73_m2} (ref >59)

## 2022-08-25 LAB — CBC
Hematocrit: 42.8 % (ref 34.0–46.6)
Hemoglobin: 14.5 g/dL (ref 11.1–15.9)
MCH: 32.3 pg (ref 26.6–33.0)
MCHC: 33.9 g/dL (ref 31.5–35.7)
MCV: 95 fL (ref 79–97)
Platelets: 209 10*3/uL (ref 150–450)
RBC: 4.49 x10E6/uL (ref 3.77–5.28)
RDW: 13.3 % (ref 11.7–15.4)
WBC: 8.5 10*3/uL (ref 3.4–10.8)

## 2022-08-25 LAB — TSH: TSH: 1.11 u[IU]/mL (ref 0.450–4.500)

## 2022-08-25 NOTE — Assessment & Plan Note (Signed)
Exam unremarkable.  Labs and COVID testing for further evaluation.

## 2022-08-25 NOTE — Progress Notes (Signed)
Subjective:  Patient ID: [name], female    DOB: [birth year]  Age: 66 y.o. MRN: [id number]  CC: Chief Complaint  Patient presents with   Fatigue    Pt has slept for 3 days, feels bad, muscles are sore, ears feel like they have water in it, fatigue. Singular and Sudafed.     HPI:  66 year old female presents with fatigue.  Patient has a history of fatigue.  She reports that over the past 3 days she seems to be experiencing more fatigue.  Reports that she just feels drained and fatigued.  Sleeping a lot.  She has had some cough and occasional sneezing.  She has had some discomfort in the ears.  She has taken Sudafed and Singulair without resolution.  Overall she just does not feel well.  No relieving factors.  No known exacerbating factors.  Patient Active Problem List   Diagnosis Date Noted   Acute cough 05/27/2022   Primary osteoarthritis involving multiple joints 05/15/2022   Fatigue 05/15/2022   Asthma 03/04/2022   CAD (coronary artery disease) 03/04/2022   Anxiety and depression 03/04/2022   Healthcare maintenance 09/18/2021   IBS (irritable bowel syndrome) 07/31/2021   Prediabetes 12/13/2017   Hyperlipidemia 12/04/2016   Gastroesophageal reflux disease 09/19/2012   Essential hypertension 07/25/2012   Hypothyroidism 07/25/2012    Social Hx   Social History   Socioeconomic History   Marital status: Married    Spouse name: Not on file   Number of children: Not on file   Years of education: 12   Highest education level: Not on file  Occupational History   Not on file  Tobacco Use   Smoking status: Former    Packs/day: 1.50    Years: 27.00    Total pack years: 40.50    Types: Cigarettes    Start date: 1973    Quit date: 2000    Years since quitting: 23.7   Smokeless tobacco: Never  Vaping Use   Vaping Use: Never used  Substance and Sexual Activity   Alcohol use: No   Drug use: No   Sexual activity: Not on file  Other Topics Concern   Not on file   Social History Narrative   Not on file   Social Determinants of Health   Financial Resource Strain: Not on file  Food Insecurity: Not on file  Transportation Needs: Not on file  Physical Activity: Not on file  Stress: Not on file  Social Connections: Not on file    Review of Systems Per HPI  Objective:  BP 132/78   Pulse 65   Temp 97.7 F (36.5 C)   Wt 173 lb 9.6 oz (78.7 kg)   SpO2 95%   BMI 31.75 kg/m      08/24/2022    3:44 PM 05/27/2022   11:07 AM 05/14/2022   11:05 AM  BP/Weight  Systolic BP 409 811 914  Diastolic BP 78 98 75  Wt. (Lbs) 173.6 173 171  BMI 31.75 kg/m2 31.64 kg/m2 31.28 kg/m2    Physical Exam Vitals and nursing note reviewed.  Constitutional:      General: She is not in acute distress.    Appearance: Normal appearance.  HENT:     Head: Normocephalic and atraumatic.     Right Ear: Tympanic membrane normal.     Left Ear: Tympanic membrane normal.  Cardiovascular:     Rate and Rhythm: Normal rate and regular rhythm.  Pulmonary:     Effort:  Pulmonary effort is normal.     Breath sounds: Normal breath sounds. No wheezing, rhonchi or rales.  Neurological:     Mental Status: She is alert.     Lab Results  Component Value Date   WBC 8.5 08/24/2022   HGB 14.5 08/24/2022   HCT 42.8 08/24/2022   PLT 209 08/24/2022   GLUCOSE 94 08/24/2022   CHOL 141 05/14/2022   TRIG 77 05/14/2022   HDL 62 05/14/2022   LDLCALC 64 05/14/2022   ALT 25 08/24/2022   AST 22 08/24/2022   NA 138 08/24/2022   K 4.7 08/24/2022   CL 100 08/24/2022   CREATININE 0.96 08/24/2022   BUN 16 08/24/2022   CO2 22 08/24/2022   TSH 1.110 08/24/2022   INR 0.9 10/21/2007   HGBA1C 5.8 (H) 06/16/2021     Assessment & Plan:   Problem List Items Addressed This Visit       Other   Fatigue - Primary    Exam unremarkable.  Labs and COVID testing for further evaluation.      Relevant Orders   CBC (Completed)   CMP14+EGFR (Completed)   TSH (Completed)   COVID-19,  Flu A+B and RSV    Follow-up: Pending work-up  Victor

## 2022-08-26 LAB — COVID-19, FLU A+B AND RSV
Influenza A, NAA: NOT DETECTED
Influenza B, NAA: NOT DETECTED
RSV, NAA: NOT DETECTED
SARS-CoV-2, NAA: NOT DETECTED

## 2022-09-08 DIAGNOSIS — M659 Synovitis and tenosynovitis, unspecified: Secondary | ICD-10-CM | POA: Diagnosis not present

## 2022-09-08 DIAGNOSIS — M1812 Unilateral primary osteoarthritis of first carpometacarpal joint, left hand: Secondary | ICD-10-CM | POA: Diagnosis not present

## 2022-09-08 DIAGNOSIS — M65842 Other synovitis and tenosynovitis, left hand: Secondary | ICD-10-CM | POA: Diagnosis not present

## 2022-09-08 DIAGNOSIS — G8918 Other acute postprocedural pain: Secondary | ICD-10-CM | POA: Diagnosis not present

## 2022-09-08 DIAGNOSIS — M19032 Primary osteoarthritis, left wrist: Secondary | ICD-10-CM | POA: Diagnosis not present

## 2022-09-23 DIAGNOSIS — Z4789 Encounter for other orthopedic aftercare: Secondary | ICD-10-CM | POA: Diagnosis not present

## 2022-10-03 ENCOUNTER — Ambulatory Visit (INDEPENDENT_AMBULATORY_CARE_PROVIDER_SITE_OTHER): Payer: PPO

## 2022-10-03 DIAGNOSIS — Z23 Encounter for immunization: Secondary | ICD-10-CM | POA: Diagnosis not present

## 2022-10-05 IMAGING — DX DG CHEST 2V
2 series · 2 of 2 positions shown · non-contrast
Comparison: 12/12/2021

CLINICAL DATA: Chest pain

EXAM:
CHEST - 2 VIEW

[chest pa]
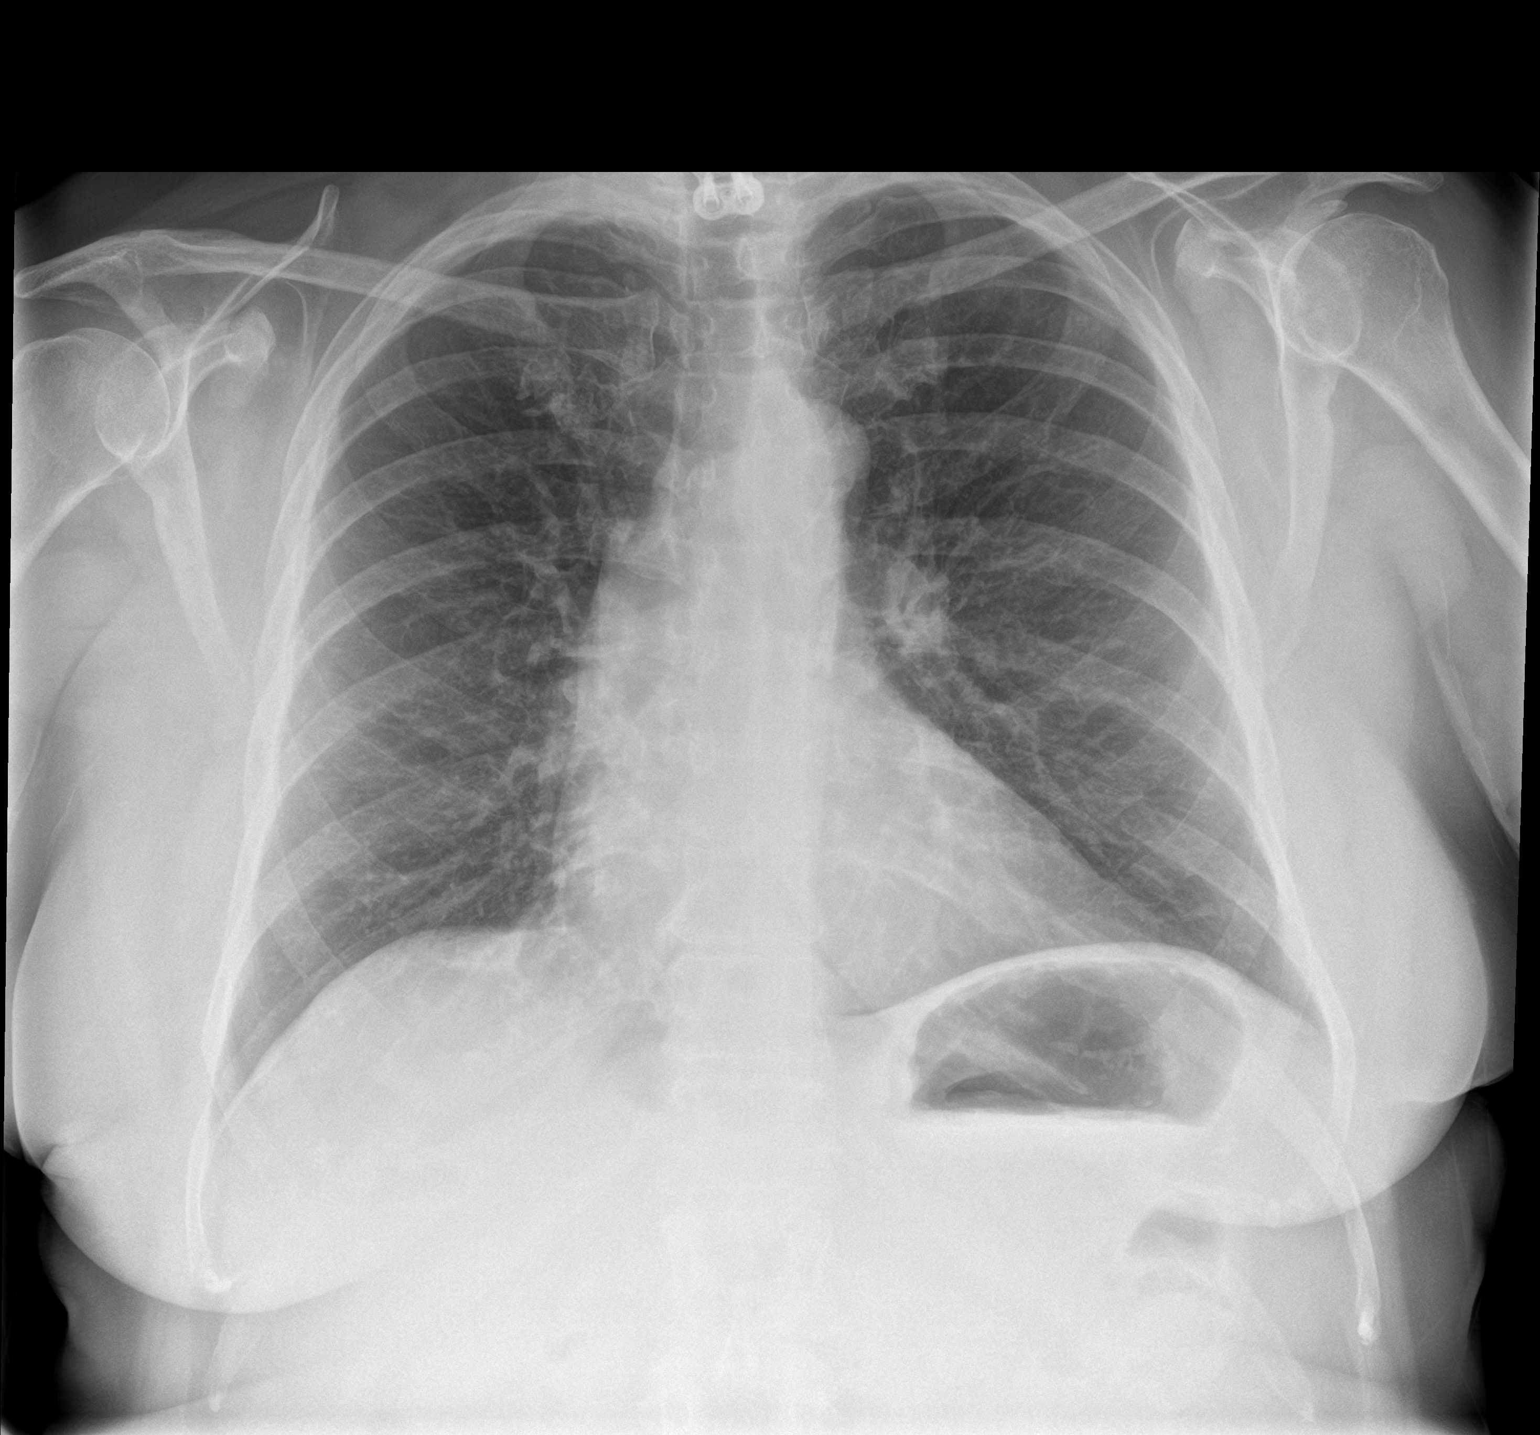

[chest lat]
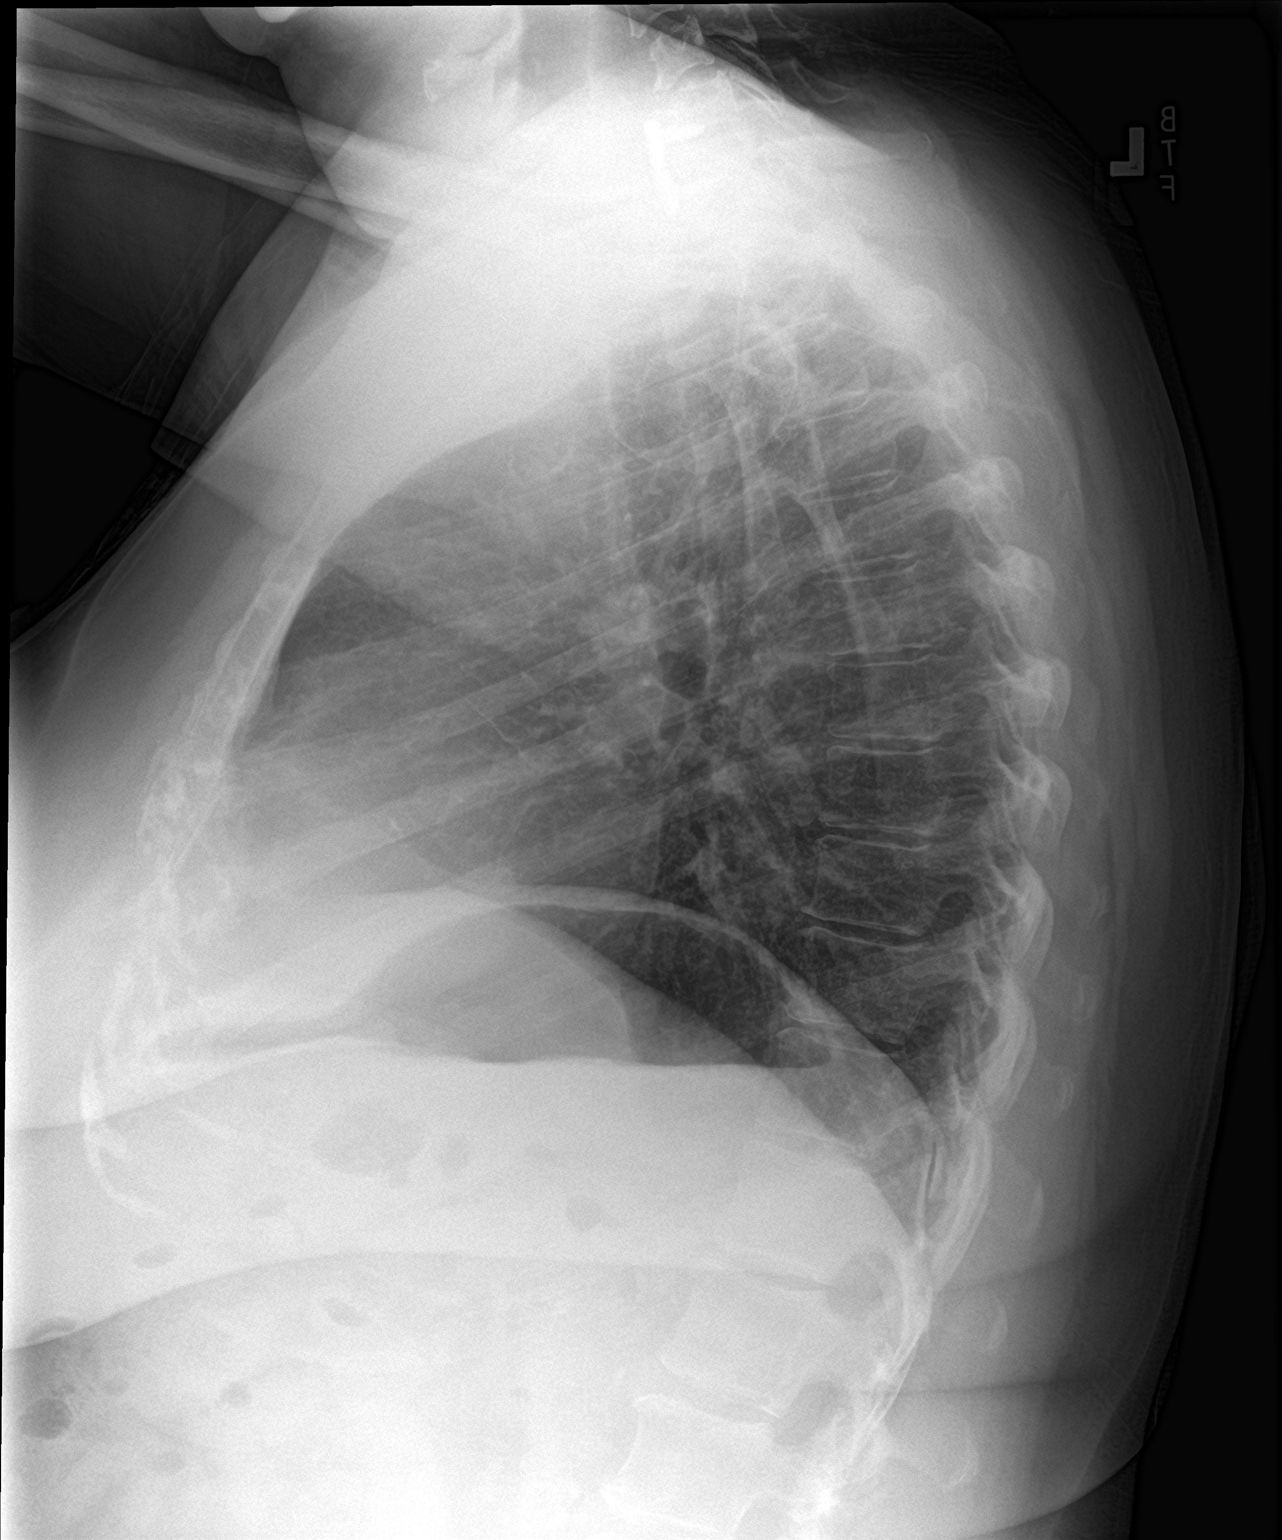

[2 of 2 positions shown; findings below may reference images not displayed]

FINDINGS: The heart size and mediastinal contours are within normal limits.
Both lungs are clear. No pleural effusion or pneumothorax. The
visualized skeletal structures are unremarkable.
IMPRESSION: No acute process in the chest.

## 2022-10-07 DIAGNOSIS — Z4789 Encounter for other orthopedic aftercare: Secondary | ICD-10-CM | POA: Diagnosis not present

## 2022-10-07 DIAGNOSIS — M65311 Trigger thumb, right thumb: Secondary | ICD-10-CM | POA: Insufficient documentation

## 2022-10-07 DIAGNOSIS — M13842 Other specified arthritis, left hand: Secondary | ICD-10-CM | POA: Diagnosis not present

## 2022-10-07 DIAGNOSIS — M79642 Pain in left hand: Secondary | ICD-10-CM | POA: Diagnosis not present

## 2022-10-07 DIAGNOSIS — M25642 Stiffness of left hand, not elsewhere classified: Secondary | ICD-10-CM | POA: Diagnosis not present

## 2022-10-12 DIAGNOSIS — H04123 Dry eye syndrome of bilateral lacrimal glands: Secondary | ICD-10-CM | POA: Diagnosis not present

## 2022-11-02 ENCOUNTER — Ambulatory Visit (INDEPENDENT_AMBULATORY_CARE_PROVIDER_SITE_OTHER): Payer: PPO | Admitting: Nurse Practitioner

## 2022-11-02 ENCOUNTER — Encounter: Payer: Self-pay | Admitting: Nurse Practitioner

## 2022-11-02 VITALS — BP 163/78 | HR 85 | Ht 62.0 in | Wt 173.6 lb

## 2022-11-02 DIAGNOSIS — Z0000 Encounter for general adult medical examination without abnormal findings: Secondary | ICD-10-CM

## 2022-11-02 DIAGNOSIS — Z23 Encounter for immunization: Secondary | ICD-10-CM | POA: Diagnosis not present

## 2022-11-02 NOTE — Progress Notes (Signed)
   Subjective:    Patient ID: [name], female    DOB: Sep 04, 1956, 66 y.o.   MRN: [id number]  HPI AWV- Annual Wellness Visit  The patient was seen for their annual wellness visit. The patient's past medical history, surgical history, and family history were reviewed. Pertinent vaccines were reviewed ( tetanus, pneumonia, shingles, flu) The patient's medication list was reviewed and updated.  The height and weight were entered.  BMI recorded in electronic record elsewhere  Cognitive screening was completed. Outcome of Mini - Cog: pass   Falls /depression screening electronically recorded within record elsewhere  Current tobacco usage:none  (All patients who use tobacco were given written and verbal information on quitting)  Recent listing of emergency department/hospitalizations over the past year were reviewed.  current specialist the patient sees on a regular basis: Just had wrist and shoulder surgery. Colonoscopy and mammogram up to date   Medicare annual wellness visit patient questionnaire was reviewed.  A written screening schedule for the patient for the next 5-10 years was given. Appropriate discussion of followup regarding next visit was discussed.      Review of Systems  All other systems reviewed and are negative.      Objective:   Physical Exam Vitals reviewed.  Constitutional:      General: She is not in acute distress.    Appearance: Normal appearance. She is obese. She is not ill-appearing, toxic-appearing or diaphoretic.  HENT:     Head: Normocephalic and atraumatic.  Neurological:     Mental Status: She is alert.  Psychiatric:        Mood and Affect: Mood normal.        Behavior: Behavior normal.           Assessment & Plan:   1. Medicare annual wellness visit, initial Adult wellness-complete.wellness physical was conducted today. Importance of diet and exercise were discussed in detail.  Importance of stress reduction and healthy  living were discussed.  In addition to this a discussion regarding safety was also covered.  We also reviewed over immunizations and gave recommendations regarding current immunization needed for age.   In addition to this additional areas were also touched on including: Preventative health exams needed:  Colonoscopy up to date. Due 05/2030 Mammogram up to date. Due 08/2023 COVID #4 declined today Shingles vaccine needed. Patient to schedule with local pharmacy Pneumonia vaccine done today  Patient was advised yearly wellness exam

## 2022-11-02 NOTE — Patient Instructions (Addendum)
Thank you for coming for your annual wellness visit.  Please follow through on any advice that was given to you by today's visit. Remember to maintain compliance with your medications as discussed today.  Also remember it is important to eat a healthy diet and to stay physically active on a daily basis.  Please follow through with any testing or recommended followup office visits as was discussed today. You are due the following test coming up:  Colonoscopy up to date. Due 05/2030 Mammogram up to date. Due 08/2023 COVID #4 declined today Shingles vaccine needed. Patient to schedule with local pharmacy Pneumonia vaccine done today      Finally remembered that the annual wellness visit does not take the place of regularly scheduled office visits  chronic health problems such as hypertension/diabetes/cholesterol visits.

## 2022-11-04 DIAGNOSIS — Z4789 Encounter for other orthopedic aftercare: Secondary | ICD-10-CM | POA: Diagnosis not present

## 2022-11-04 DIAGNOSIS — M189 Osteoarthritis of first carpometacarpal joint, unspecified: Secondary | ICD-10-CM | POA: Insufficient documentation

## 2022-11-04 DIAGNOSIS — M79642 Pain in left hand: Secondary | ICD-10-CM | POA: Diagnosis not present

## 2022-11-04 DIAGNOSIS — M65311 Trigger thumb, right thumb: Secondary | ICD-10-CM | POA: Diagnosis not present

## 2022-11-04 DIAGNOSIS — M67442 Ganglion, left hand: Secondary | ICD-10-CM | POA: Diagnosis not present

## 2022-11-04 DIAGNOSIS — M6740 Ganglion, unspecified site: Secondary | ICD-10-CM | POA: Insufficient documentation

## 2022-12-22 DIAGNOSIS — L2089 Other atopic dermatitis: Secondary | ICD-10-CM | POA: Diagnosis not present

## 2022-12-22 DIAGNOSIS — L718 Other rosacea: Secondary | ICD-10-CM | POA: Diagnosis not present

## 2023-01-06 ENCOUNTER — Other Ambulatory Visit: Payer: Self-pay | Admitting: Internal Medicine

## 2023-01-08 ENCOUNTER — Telehealth: Payer: Self-pay | Admitting: Internal Medicine

## 2023-01-08 MED ORDER — LOSARTAN POTASSIUM 100 MG PO TABS: ORAL_TABLET | ORAL | 0 refills | Status: DC

## 2023-01-08 MED ORDER — ROSUVASTATIN CALCIUM 20 MG PO TABS: 20.0000 mg | ORAL_TABLET | Freq: Every day | ORAL | 0 refills | Status: DC

## 2023-01-08 NOTE — Telephone Encounter (Signed)
Needs apt, front desk aware

## 2023-01-08 NOTE — Telephone Encounter (Signed)
*  STAT* If patient is at the pharmacy, call can be transferred to refill team.   1. Which medications need to be refilled? (please list name of each medication and dose if known)   losartan (COZAAR) 100 MG tablet  rosuvastatin (CRESTOR) 20 MG tablet   2. Which pharmacy/location (including street and city if local pharmacy) is medication to be sent to?  [place]   3. Do they need a 30 day or 90 day supply?   90 days   Patient state she just got these prescriptions refilled but they were refilled for 30 days and she needs the refills to be for 90 days.

## 2023-01-10 ENCOUNTER — Ambulatory Visit
Admission: EM | Admit: 2023-01-10 | Discharge: 2023-01-10 | Disposition: A | Discharge status: Home / Self Care | Payer: PPO | Attending: Nurse Practitioner | Admitting: Nurse Practitioner

## 2023-01-10 ENCOUNTER — Ambulatory Visit (INDEPENDENT_AMBULATORY_CARE_PROVIDER_SITE_OTHER): Payer: PPO

## 2023-01-10 DIAGNOSIS — J45909 Unspecified asthma, uncomplicated: Secondary | ICD-10-CM | POA: Diagnosis not present

## 2023-01-10 DIAGNOSIS — J45901 Unspecified asthma with (acute) exacerbation: Secondary | ICD-10-CM

## 2023-01-10 DIAGNOSIS — R0602 Shortness of breath: Secondary | ICD-10-CM | POA: Diagnosis not present

## 2023-01-10 DIAGNOSIS — R059 Cough, unspecified: Secondary | ICD-10-CM

## 2023-01-10 MED ORDER — PREDNISONE 20 MG PO TABS: 40.0000 mg | ORAL_TABLET | Freq: Every day | ORAL | 0 refills | Status: AC

## 2023-01-10 MED ORDER — PROMETHAZINE-DM 6.25-15 MG/5ML PO SYRP: 5.0000 mL | ORAL_SOLUTION | Freq: Four times a day (QID) | ORAL | 0 refills | Status: DC | PRN

## 2023-01-10 MED ORDER — METHYLPREDNISOLONE SODIUM SUCC 125 MG IJ SOLR
60.0000 mg | Freq: Once | INTRAMUSCULAR | Status: AC
  Administered 2023-01-10: 60 mg via INTRAMUSCULAR

## 2023-01-10 NOTE — Discharge Instructions (Addendum)
The chest x-ray does not show any signs of pneumonia. Take medication as prescribed.  As discussed, please begin taking your Singulair daily as this will also help with your asthma. Increase fluids and allow for plenty of rest.  May take over-the-counter Tylenol or ibuprofen as needed for pain, fever, or general discomfort. Recommend using a humidifier in your bedroom at nighttime during sleep and sleeping elevated on pillows while cough symptoms persist. If you develop worsening shortness of breath, difficulty breathing, or become unable to speak in a complete sentence, please follow-up in the emergency department.  If symptoms fail to improve, please contact your primary care physician for further evaluation. Follow-up as needed.

## 2023-01-10 NOTE — ED Provider Notes (Signed)
[facility]    CSN: [id number] Arrival date & time: 01/10/23  1329      History   Chief Complaint No chief complaint on file.   HPI [name] is a 67 y.o. female.   The history is provided by the patient.   Presents with a 2-day history of wheezing, cough, and shortness of breath.  Patient states that she has a history of asthma.  She states that symptoms started more than 1 week ago, but worsened over the past 48 hours.  She states that she continues to experience chest tightness that she describes as "heaviness".  She states that she has used 2 nebulizer treatments this morning and that has helped with her wheezing.  Also states that she had 20 mg of prednisone that she took.  She states that she does begin wheezing when she is active and moving around.  Patient denies fever, chills, sore throat, headache, trouble breathing, or GI symptoms.  Patient also uses Breztri for her asthma.  She reports history of seasonal allergies, states that she takes her Singulair as needed.  Past Medical History:  Diagnosis Date   Anxiety    Arthritis    Depression    GERD (gastroesophageal reflux disease)    Hypertension    Hypothyroid    Hypothyroidism    Moderate persistent asthma 01/20/2021   PONV (postoperative nausea and vomiting)    Recurrent upper respiratory infection (URI)    Urticaria     Patient Active Problem List   Diagnosis Date Noted   Acute cough 05/27/2022   Primary osteoarthritis involving multiple joints 05/15/2022   Fatigue 05/15/2022   Asthma 03/04/2022   CAD (coronary artery disease) 03/04/2022   Anxiety and depression 03/04/2022   Healthcare maintenance 09/18/2021   IBS (irritable bowel syndrome) 07/31/2021   Prediabetes 12/13/2017   Hyperlipidemia 12/04/2016   Gastroesophageal reflux disease 09/19/2012   Essential hypertension 07/25/2012   Hypothyroidism 07/25/2012    Past Surgical History:  Procedure Laterality Date   BACK SURGERY      BIOPSY  06/03/2020   Procedure: BIOPSY;  Surgeon: Malissa Hippo, MD;  Location: [facility];  Service: Endoscopy;;   CARPAL TUNNEL RELEASE  06/16/2012   Procedure: CARPAL TUNNEL RELEASE;  Surgeon: Dominica Severin, MD;  Location: [facility];  Service: Orthopedics;  Laterality: Right;  limited open carpal tunnel release   CERVICAL FUSION  2018   CHOLECYSTECTOMY     COLONOSCOPY     COLONOSCOPY WITH PROPOFOL N/A 06/03/2020   Procedure: COLONOSCOPY WITH PROPOFOL;  Surgeon: Malissa Hippo, MD;  Location: [facility];  Service: Endoscopy;  Laterality: N/A;  115   ESOPHAGOGASTRODUODENOSCOPY (EGD) WITH PROPOFOL N/A 06/03/2020   Procedure: ESOPHAGOGASTRODUODENOSCOPY (EGD) WITH PROPOFOL;  Surgeon: Malissa Hippo, MD;  Location: [facility];  Service: Endoscopy;  Laterality: N/A;   TONSILLECTOMY     TRIGGER FINGER RELEASE  06/16/2012   Procedure: RELEASE TRIGGER FINGER/A-1 PULLEY;  Surgeon: Dominica Severin, MD;  Location: [facility];  Service: Orthopedics;  Laterality: Right;  right middle a-1 release   TUBAL LIGATION     UPPER GASTROINTESTINAL ENDOSCOPY     VAGINAL HYSTERECTOMY      OB History   No obstetric history on file.      Home Medications    Prior to Admission medications   Medication Sig Start Date End Date Taking? Authorizing Provider  predniSONE (DELTASONE) 20 MG tablet Take 2 tablets (40 mg total)  by mouth daily with breakfast for 5 days. 01/10/23 01/15/23 Yes Bobby Barton-Warren, Alda Lea, NP  promethazine-dextromethorphan (PROMETHAZINE-DM) 6.25-15 MG/5ML syrup Take 5 mLs by mouth 4 (four) times daily as needed for cough. 01/10/23  Yes Correy Weidner-Warren, Alda Lea, NP  albuterol (VENTOLIN HFA) 108 (90 Base) MCG/ACT inhaler Inhale 1-2 puffs into the lungs every 6 (six) hours as needed for wheezing or shortness of breath. 03/11/20   Avegno, Darrelyn Hillock, FNP  Budeson-Glycopyrrol-Formoterol (BREZTRI AEROSPHERE) 160-9-4.8 MCG/ACT AERO Inhale 2 puffs into the lungs in  the morning and at bedtime. 12/24/21   Ambs, Kathrine Cords, FNP  budesonide (PULMICORT) 0.5 MG/2ML nebulizer solution Take 2 mLs (0.5 mg total) by nebulization in the morning and at bedtime. 12/24/21   Dara Hoyer, FNP  fluticasone (FLONASE) 50 MCG/ACT nasal spray Place 2 sprays into both nostrils daily. 12/24/21   Dara Hoyer, FNP  gabapentin (NEURONTIN) 100 MG capsule Take 100 mg by mouth at bedtime as needed (back pain).    [provider]  levothyroxine (SYNTHROID) 137 MCG tablet TAKE (1) TABLET BY MOUTH ONCE DAILY BEFORE BREAKFAST. 03/04/22   Coral Spikes, DO  linaclotide (LINZESS) 72 MCG capsule Take 1 capsule (72 mcg total) by mouth daily before breakfast. 12/25/21   Montez Morita, Quillian Quince, MD  losartan (COZAAR) 100 MG tablet TAKE (1) TABLET BY MOUTH ONCE DAILY. 01/08/23   Fay Records, MD  meloxicam (MOBIC) 15 MG tablet Take 1 tablet (15 mg total) by mouth daily as needed for pain. 05/26/22   Coral Spikes, DO  montelukast (SINGULAIR) 10 MG tablet Take 1 tablet (10 mg total) by mouth at bedtime. 12/24/21   Dara Hoyer, FNP  pantoprazole (PROTONIX) 40 MG tablet Take 1 tablet (40 mg total) by mouth daily. 12/15/21   Ambs, Kathrine Cords, FNP  polyethylene glycol powder (GLYCOLAX/MIRALAX) 17 GM/SCOOP powder Take 17 g by mouth daily. 06/03/20   Rehman, Mechele Dawley, MD  rosuvastatin (CRESTOR) 20 MG tablet Take 1 tablet (20 mg total) by mouth daily. 01/08/23   Fay Records, MD  saline (AYR) GEL Place 1 application into both nostrils daily as needed (dryness).    [provider]  sertraline (ZOLOFT) 100 MG tablet TAKE 1 AND 1/2 TABLETS BY MOUTH ONCE DAILY. 03/04/22   Coral Spikes, DO  Sodium Fluoride (PREVIDENT 5000 BOOSTER) 1.1 % PSTE Place 1 application onto teeth at bedtime. 10/09/20   Erven Colla, DO  traMADol Veatrice Bourbon) 50 MG tablet Take one tab po q 8 hrs prn pain 02/01/22   Nilda Simmer, NP    Family History Family History  Problem Relation Age of Onset   COPD Mother    Osteoporosis  Mother    Immunodeficiency Mother    COPD Father    Emphysema Father    Tuberculosis Father    Lung disease Father    COPD Sister    Healthy Daughter    Healthy Son    Arthritis Other    Lung disease Other    Cancer Other    Asthma Other    Eczema Neg Hx    Atopy Neg Hx    Angioedema Neg Hx    Allergic rhinitis Neg Hx     Social History Social History   Tobacco Use   Smoking status: Former    Packs/day: 1.50    Years: 27.00    Total pack years: 40.50    Types: Cigarettes    Start date: 1973    Quit  date: 2000    Years since quitting: 24.1   Smokeless tobacco: Never  Vaping Use   Vaping Use: Never used  Substance Use Topics   Alcohol use: No   Drug use: No     Allergies   Penicillins   Review of Systems Review of Systems Per HPI  Physical Exam Triage Vital Signs ED Triage Vitals  Enc Vitals Group     BP 01/10/23 1402 (!) 160/69     Pulse Rate 01/10/23 1402 (!) 101     Resp 01/10/23 1402 18     Temp 01/10/23 1402 97.7 F (36.5 C)     Temp Source 01/10/23 1402 Oral     SpO2 01/10/23 1402 96 %     Weight --      Height --      Head Circumference --      Peak Flow --      Pain Score 01/10/23 1404 0     Pain Loc --      Pain Edu? --      Excl. in [place]? --    No data found.  Updated Vital Signs BP (!) 160/69 (BP Location: Right Arm)   Pulse (!) 101   Temp 97.7 F (36.5 C) (Oral)   Resp 18   SpO2 96%   Visual Acuity Right Eye Distance:   Left Eye Distance:   Bilateral Distance:    Right Eye Near:   Left Eye Near:    Bilateral Near:     Physical Exam Vitals and nursing note reviewed.  Constitutional:      General: She is not in acute distress.    Appearance: Normal appearance.  HENT:     Head: Normocephalic.     Right Ear: Tympanic membrane, ear canal and external ear normal.     Left Ear: Tympanic membrane, ear canal and external ear normal.     Nose: Congestion present.     Right Turbinates: Enlarged and swollen.     Left  Turbinates: Enlarged and swollen.     Right Sinus: No maxillary sinus tenderness or frontal sinus tenderness.     Left Sinus: No maxillary sinus tenderness or frontal sinus tenderness.     Mouth/Throat:     Mouth: Mucous membranes are moist.     Pharynx: Oropharynx is clear. Uvula midline. No pharyngeal swelling or posterior oropharyngeal erythema.  Eyes:     Extraocular Movements: Extraocular movements intact.     Conjunctiva/sclera: Conjunctivae normal.     Pupils: Pupils are equal, round, and reactive to light.  Cardiovascular:     Rate and Rhythm: Normal rate and regular rhythm.     Pulses: Normal pulses.     Heart sounds: Normal heart sounds.  Pulmonary:     Effort: Pulmonary effort is normal. No respiratory distress.     Breath sounds: Normal breath sounds. No stridor. No wheezing, rhonchi or rales.  Abdominal:     General: Bowel sounds are normal.     Palpations: Abdomen is soft.     Tenderness: There is no abdominal tenderness.  Musculoskeletal:     Cervical back: Normal range of motion.  Lymphadenopathy:     Cervical: No cervical adenopathy.  Skin:    General: Skin is warm and dry.  Neurological:     General: No focal deficit present.     Mental Status: She is alert and oriented to person, place, and time.  Psychiatric:        Mood and  Affect: Mood normal.        Behavior: Behavior normal.      UC Treatments / Results  Labs (all labs ordered are listed, but only abnormal results are displayed) Labs Reviewed - No data to display  EKG   Radiology DG Chest 2 View  Result Date: 01/10/2023 CLINICAL DATA:  Cough and shortness of breath for 2 days. Asthma. Ex-smoker. EXAM: CHEST - 2 VIEW COMPARISON:  05/27/2022 FINDINGS: Mild hyperinflation. Cervical spine fixation. Midline trachea. Patient rotated minimally right. Normal heart size and mediastinal contours. No pleural effusion or pneumothorax. Clear lungs. IMPRESSION: Hyperinflation, without acute disease.  Electronically Signed   By: Abigail Miyamoto M.D.   On: 01/10/2023 14:52    Procedures Procedures (including critical care time)  Medications Ordered in UC Medications  methylPREDNISolone sodium succinate (SOLU-MEDROL) 125 mg/2 mL injection 60 mg (60 mg Intramuscular Given 01/10/23 1454)    Initial Impression / Assessment and Plan / UC Course  I have reviewed the triage vital signs and the nursing notes.  Pertinent labs & imaging results that were available during my care of the patient were reviewed by me and considered in my medical decision making (see chart for details).  The patient is well-appearing, she is in no acute distress, vital signs show that she is hypertensive and mildly tachycardic.  Patient is speaking in complete sentences without difficulty.  Symptoms consistent with an acute asthma exacerbation.  Patient was administered Solu-Medrol 60 mg IM in the clinic today.  Chest x-ray was negative for pneumonia, O2 sats at 96%.  Will start patient on prednisone 40 mg for asthma exacerbation, and Promethazine DM for bedtime for cough.  Patient was advised to continue her current asthma regimen.  Discussed strict ER precautions with the patient.  Recommended follow-up with her primary care physician for reevaluation.  Patient verbalizes understanding.  All questions were answered.  Patient stable for discharge.   Final Clinical Impressions(s) / UC Diagnoses   Final diagnoses:  Asthma with acute exacerbation, unspecified asthma severity, unspecified whether persistent     Discharge Instructions      The chest x-ray does not show any signs of pneumonia. Take medication as prescribed.  As discussed, please begin taking your Singulair daily as this will also help with your asthma. Increase fluids and allow for plenty of rest.  May take over-the-counter Tylenol or ibuprofen as needed for pain, fever, or general discomfort. Recommend using a humidifier in your bedroom at nighttime  during sleep and sleeping elevated on pillows while cough symptoms persist. If you develop worsening shortness of breath, difficulty breathing, or become unable to speak in a complete sentence, please follow-up in the emergency department.  If symptoms fail to improve, please contact your primary care physician for further evaluation. Follow-up as needed.     ED Prescriptions     Medication Sig Dispense Auth. Provider   predniSONE (DELTASONE) 20 MG tablet Take 2 tablets (40 mg total) by mouth daily with breakfast for 5 days. 10 tablet Masiyah Engen-Warren, Alda Lea, NP   promethazine-dextromethorphan (PROMETHAZINE-DM) 6.25-15 MG/5ML syrup Take 5 mLs by mouth 4 (four) times daily as needed for cough. 118 mL Lashia Niese-Warren, Alda Lea, NP      PDMP not reviewed this encounter.   Tish Men, NP 01/10/23 1609

## 2023-01-10 NOTE — ED Triage Notes (Signed)
Pt reports she is having trouble breathing x 2 days. Pt is wheezing and has some chest heaviness and is very hot Took neb treatment and steroid pill

## 2023-01-12 ENCOUNTER — Telehealth: Payer: Self-pay

## 2023-01-12 DIAGNOSIS — I10 Essential (primary) hypertension: Secondary | ICD-10-CM

## 2023-01-12 DIAGNOSIS — E7849 Other hyperlipidemia: Secondary | ICD-10-CM

## 2023-01-12 DIAGNOSIS — E038 Other specified hypothyroidism: Secondary | ICD-10-CM

## 2023-01-12 NOTE — Telephone Encounter (Signed)
Pt called she has appt Friday afternoon with Dr Lacinda Axon for check up she is not feeling well having dizziness want blood work order so that her and Dr Lacinda Axon can go over on Friday.  Pt call back is [phone]

## 2023-01-12 NOTE — Telephone Encounter (Signed)
Lab orders placed and pt is aware 

## 2023-01-12 NOTE — Telephone Encounter (Signed)
Please advise. Thank you

## 2023-01-14 DIAGNOSIS — E7849 Other hyperlipidemia: Secondary | ICD-10-CM | POA: Diagnosis not present

## 2023-01-14 DIAGNOSIS — E038 Other specified hypothyroidism: Secondary | ICD-10-CM | POA: Diagnosis not present

## 2023-01-14 DIAGNOSIS — I10 Essential (primary) hypertension: Secondary | ICD-10-CM | POA: Diagnosis not present

## 2023-01-15 ENCOUNTER — Other Ambulatory Visit (HOSPITAL_COMMUNITY): Payer: Self-pay

## 2023-01-15 ENCOUNTER — Ambulatory Visit (INDEPENDENT_AMBULATORY_CARE_PROVIDER_SITE_OTHER): Payer: PPO | Admitting: Family Medicine

## 2023-01-15 DIAGNOSIS — E669 Obesity, unspecified: Secondary | ICD-10-CM | POA: Diagnosis not present

## 2023-01-15 DIAGNOSIS — E038 Other specified hypothyroidism: Secondary | ICD-10-CM | POA: Diagnosis not present

## 2023-01-15 DIAGNOSIS — I10 Essential (primary) hypertension: Secondary | ICD-10-CM | POA: Diagnosis not present

## 2023-01-15 DIAGNOSIS — E7849 Other hyperlipidemia: Secondary | ICD-10-CM | POA: Diagnosis not present

## 2023-01-15 DIAGNOSIS — H8110 Benign paroxysmal vertigo, unspecified ear: Secondary | ICD-10-CM | POA: Diagnosis not present

## 2023-01-15 LAB — COMPREHENSIVE METABOLIC PANEL
ALT: 27 IU/L (ref 0–32)
AST: 21 IU/L (ref 0–40)
Albumin/Globulin Ratio: 1.7 (ref 1.2–2.2)
Albumin: 4.3 g/dL (ref 3.9–4.9)
Alkaline Phosphatase: 71 IU/L (ref 44–121)
BUN/Creatinine Ratio: 18 (ref 12–28)
BUN: 15 mg/dL (ref 8–27)
Bilirubin Total: <0.2 mg/dL (ref 0.0–1.2)
CO2: 25 mmol/L (ref 20–29)
Calcium: 9.4 mg/dL (ref 8.7–10.3)
Chloride: 104 mmol/L (ref 96–106)
Creatinine, Ser: 0.84 mg/dL (ref 0.57–1.00)
Globulin, Total: 2.5 g/dL (ref 1.5–4.5)
Glucose: 84 mg/dL (ref 70–99)
Potassium: 4.4 mmol/L (ref 3.5–5.2)
Sodium: 141 mmol/L (ref 134–144)
Total Protein: 6.8 g/dL (ref 6.0–8.5)
eGFR: 77 mL/min/{1.73_m2} (ref >59)

## 2023-01-15 LAB — CBC WITH DIFFERENTIAL/PLATELET
Basophils Absolute: 0.1 10*3/uL (ref 0.0–0.2)
Basos: 1 %
EOS (ABSOLUTE): 0.1 10*3/uL (ref 0.0–0.4)
Eos: 1 %
Hematocrit: 42.6 % (ref 34.0–46.6)
Hemoglobin: 13.9 g/dL (ref 11.1–15.9)
Immature Grans (Abs): 0 10*3/uL (ref 0.0–0.1)
Immature Granulocytes: 0 %
Lymphocytes Absolute: 5.2 10*3/uL — ABNORMAL HIGH (ref 0.7–3.1)
Lymphs: 47 %
MCH: 31.2 pg (ref 26.6–33.0)
MCHC: 32.6 g/dL (ref 31.5–35.7)
MCV: 96 fL (ref 79–97)
Monocytes Absolute: 0.9 10*3/uL (ref 0.1–0.9)
Monocytes: 8 %
Neutrophils Absolute: 4.7 10*3/uL (ref 1.4–7.0)
Neutrophils: 43 %
Platelets: 237 10*3/uL (ref 150–450)
RBC: 4.46 x10E6/uL (ref 3.77–5.28)
RDW: 12.7 % (ref 11.7–15.4)
WBC: 11 10*3/uL — ABNORMAL HIGH (ref 3.4–10.8)

## 2023-01-15 LAB — LIPID PANEL
Chol/HDL Ratio: 2 ratio (ref 0.0–4.4)
Cholesterol, Total: 147 mg/dL (ref 100–199)
HDL: 74 mg/dL (ref >39)
LDL Chol Calc (NIH): 58 mg/dL (ref 0–99)
Triglycerides: 76 mg/dL (ref 0–149)
VLDL Cholesterol Cal: 15 mg/dL (ref 5–40)

## 2023-01-15 LAB — TSH: TSH: 0.358 u[IU]/mL — ABNORMAL LOW (ref 0.450–4.500)

## 2023-01-15 MED ORDER — SEMAGLUTIDE-WEIGHT MANAGEMENT 0.25 MG/0.5ML ~~LOC~~ SOAJ
0.2500 mg | SUBCUTANEOUS | 0 refills | Status: AC
  Filled 2023-01-15: qty 2, 28d supply, fill #0

## 2023-01-15 MED ORDER — SEMAGLUTIDE-WEIGHT MANAGEMENT 0.5 MG/0.5ML ~~LOC~~ SOAJ: 0.5000 mg | SUBCUTANEOUS | 0 refills | Status: DC

## 2023-01-15 MED ORDER — SEMAGLUTIDE-WEIGHT MANAGEMENT 0.25 MG/0.5ML ~~LOC~~ SOAJ: 0.2500 mg | SUBCUTANEOUS | 0 refills | Status: DC

## 2023-01-15 MED ORDER — SEMAGLUTIDE-WEIGHT MANAGEMENT 0.5 MG/0.5ML ~~LOC~~ SOAJ
0.5000 mg | SUBCUTANEOUS | 0 refills | Status: AC
  Filled 2023-01-15: qty 2, 28d supply, fill #0

## 2023-01-15 NOTE — Assessment & Plan Note (Signed)
Well-controlled on Crestor.  Continue.

## 2023-01-15 NOTE — Assessment & Plan Note (Signed)
Well-controlled.  Continue losartan.

## 2023-01-15 NOTE — Assessment & Plan Note (Signed)
Discussed weight loss medication.  We will start [facility].  We will see if we get this approved.  Advised that she needs to stay active and watch her diet.

## 2023-01-15 NOTE — Assessment & Plan Note (Signed)
Recent TSH was mildly suppressed.  Advised the patient that given her recent illness I do not recommend changing her therapy.  Recommend repeat TSH in 6 weeks.

## 2023-01-15 NOTE — Progress Notes (Signed)
Subjective:  Patient ID: [name], female    DOB: 04-24-1956  Age: 67 y.o. MRN: [id number]  CC: Chief Complaint  Patient presents with   Dizziness    Has been taking prednizone and promethazine for asthma attack , has been taking prednisone, promethazine, singulair   Obesity    Discuss bloodwork     HPI:  67 year old female with nonobstructive coronary artery disease, hypertension, asthma, hypothyroidism, hyperlipidemia, prediabetes presents for follow-up  Patient reports that she recently had an asthma attack and had to go to urgent care.  She is feeling better currently.  She also reports that she recently had vertigo as well.  She states that she is not sure what caused this and would like to discuss this today.  Patient's hypertension is well-controlled on losartan.  Recent labs reveal that her lipids are well-controlled on Crestor.  Patient states that she is interested in weight loss medication.  She is requesting phentermine.  I advised her that there are better alternatives currently.  We will discuss today.  Patient Active Problem List   Diagnosis Date Noted   Obesity (BMI 30-39.9) 01/15/2023   BPPV (benign paroxysmal positional vertigo) 01/15/2023   Primary osteoarthritis involving multiple joints 05/15/2022   Asthma 03/04/2022   CAD (coronary artery disease) 03/04/2022   Anxiety and depression 03/04/2022   Healthcare maintenance 09/18/2021   IBS (irritable bowel syndrome) 07/31/2021   Prediabetes 12/13/2017   Hyperlipidemia 12/04/2016   Gastroesophageal reflux disease 09/19/2012   Essential hypertension 07/25/2012   Hypothyroidism 07/25/2012    Social Hx   Social History   Socioeconomic History   Marital status: Married    Spouse name: Not on file   Number of children: Not on file   Years of education: 12   Highest education level: Not on file  Occupational History   Not on file  Tobacco Use   Smoking status: Former    Packs/day: 1.50     Years: 27.00    Total pack years: 40.50    Types: Cigarettes    Start date: 1973    Quit date: 2000    Years since quitting: 24.1   Smokeless tobacco: Never  Vaping Use   Vaping Use: Never used  Substance and Sexual Activity   Alcohol use: No   Drug use: No   Sexual activity: Not on file  Other Topics Concern   Not on file  Social History Narrative   Not on file   Social Determinants of Health   Financial Resource Strain: Not on file  Food Insecurity: Not on file  Transportation Needs: Not on file  Physical Activity: Not on file  Stress: Not on file  Social Connections: Not on file    Review of Systems Per HPI  Objective:  BP 139/87   Pulse 85   Temp 97.9 F (36.6 C)   Ht 5' 2"$  (1.575 m)   Wt 178 lb (80.7 kg)   SpO2 95%   BMI 32.56 kg/m      01/15/2023    3:47 PM 01/15/2023    3:40 PM 01/10/2023    2:02 PM  BP/Weight  Systolic BP [id number]  Diastolic BP 87 82 69  Wt. (Lbs)  178   BMI  32.56 kg/m2     Physical Exam Vitals and nursing note reviewed.  Constitutional:      General: She is not in acute distress.    Appearance: Normal appearance.  HENT:     Head:  Normocephalic and atraumatic.  Cardiovascular:     Rate and Rhythm: Normal rate and regular rhythm.  Pulmonary:     Effort: Pulmonary effort is normal.     Breath sounds: Normal breath sounds. No wheezing, rhonchi or rales.  Neurological:     Mental Status: She is alert.  Psychiatric:        Mood and Affect: Mood normal.        Behavior: Behavior normal.     Lab Results  Component Value Date   WBC 11.0 (H) 01/14/2023   HGB 13.9 01/14/2023   HCT 42.6 01/14/2023   PLT 237 01/14/2023   GLUCOSE 84 01/14/2023   CHOL 147 01/14/2023   TRIG 76 01/14/2023   HDL 74 01/14/2023   LDLCALC 58 01/14/2023   ALT 27 01/14/2023   AST 21 01/14/2023   NA 141 01/14/2023   K 4.4 01/14/2023   CL 104 01/14/2023   CREATININE 0.84 01/14/2023   BUN 15 01/14/2023   CO2 25 01/14/2023   TSH 0.358 (L)  01/14/2023   INR 0.9 10/21/2007   HGBA1C 5.8 (H) 06/16/2021     Assessment & Plan:   Problem List Items Addressed This Visit       Cardiovascular and Mediastinum   Essential hypertension    Well-controlled.  Continue losartan.        Endocrine   Hypothyroidism    Recent TSH was mildly suppressed.  Advised the patient that given her recent illness I do not recommend changing her therapy.  Recommend repeat TSH in 6 weeks.      Relevant Orders   TSH     Nervous and Auditory   BPPV (benign paroxysmal positional vertigo)    Recent vertigo.  Doing well at this time.  Supportive care.        Other   Hyperlipidemia    Well-controlled on Crestor.  Continue.      Obesity (BMI 30-39.9)    Discussed weight loss medication.  We will start [facility].  We will see if we get this approved.  Advised that she needs to stay active and watch her diet.      Relevant Medications   Semaglutide-Weight Management 0.25 MG/0.5ML SOAJ   Semaglutide-Weight Management 0.5 MG/0.5ML SOAJ (Start on 02/13/2023)    Meds ordered this encounter  Medications   DISCONTD: Semaglutide-Weight Management 0.25 MG/0.5ML SOAJ    Sig: Inject 0.25 mg into the skin once a week for 28 days.    Dispense:  2 mL    Refill:  0   DISCONTD: Semaglutide-Weight Management 0.5 MG/0.5ML SOAJ    Sig: Inject 0.5 mg into the skin once a week for 28 days.    Dispense:  2 mL    Refill:  0   Semaglutide-Weight Management 0.25 MG/0.5ML SOAJ    Sig: Inject 0.25 mg into the skin once a week for 28 days.    Dispense:  2 mL    Refill:  0   Semaglutide-Weight Management 0.5 MG/0.5ML SOAJ    Sig: Inject 0.5 mg into the skin once a week for 28 days.    Dispense:  2 mL    Refill:  0    Follow-up:  Return in about 6 months (around 07/16/2023).  [place]

## 2023-01-15 NOTE — Patient Instructions (Signed)
TSH in 6 weeks.  I sent in the [facility].  We will see if we can get it approved.  Take care  Dr. Lacinda Axon

## 2023-01-15 NOTE — Assessment & Plan Note (Signed)
Recent vertigo.  Doing well at this time.  Supportive care.

## 2023-01-18 ENCOUNTER — Other Ambulatory Visit: Payer: Self-pay | Admitting: Family Medicine

## 2023-01-18 ENCOUNTER — Other Ambulatory Visit (HOSPITAL_COMMUNITY): Payer: Self-pay

## 2023-01-20 ENCOUNTER — Telehealth: Payer: Self-pay | Admitting: Family Medicine

## 2023-01-20 NOTE — Telephone Encounter (Signed)
PA completed for [employer]-PA denied. Denied because drug is not a covered benefit. Message sent to patient.

## 2023-02-11 IMAGING — DX DG HAND COMPLETE 3+V*R*
3 series · 3 of 3 positions shown · non-contrast
Comparison: Right hand radiographs 12/08/2019

CLINICAL DATA: Hand pain.  Osteoarthritis.  First metacarpal.

EXAM:
RIGHT HAND - COMPLETE 3+ VIEW

[hand pa]
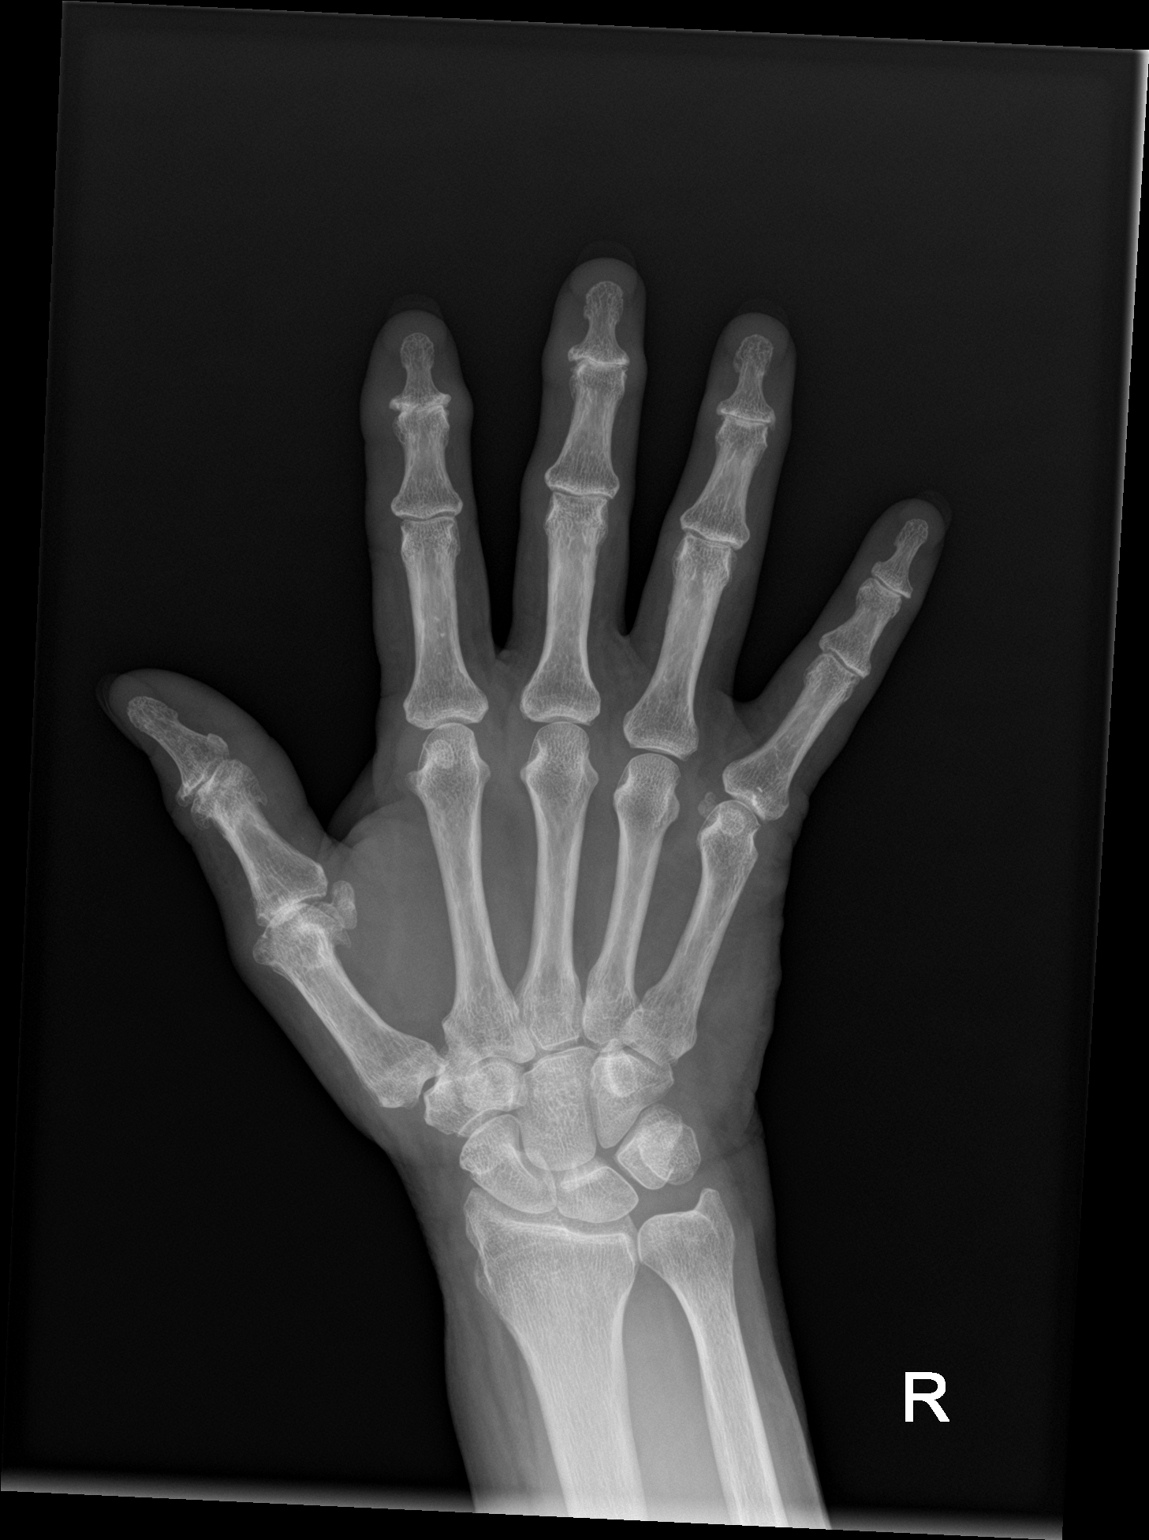

[hand obl]
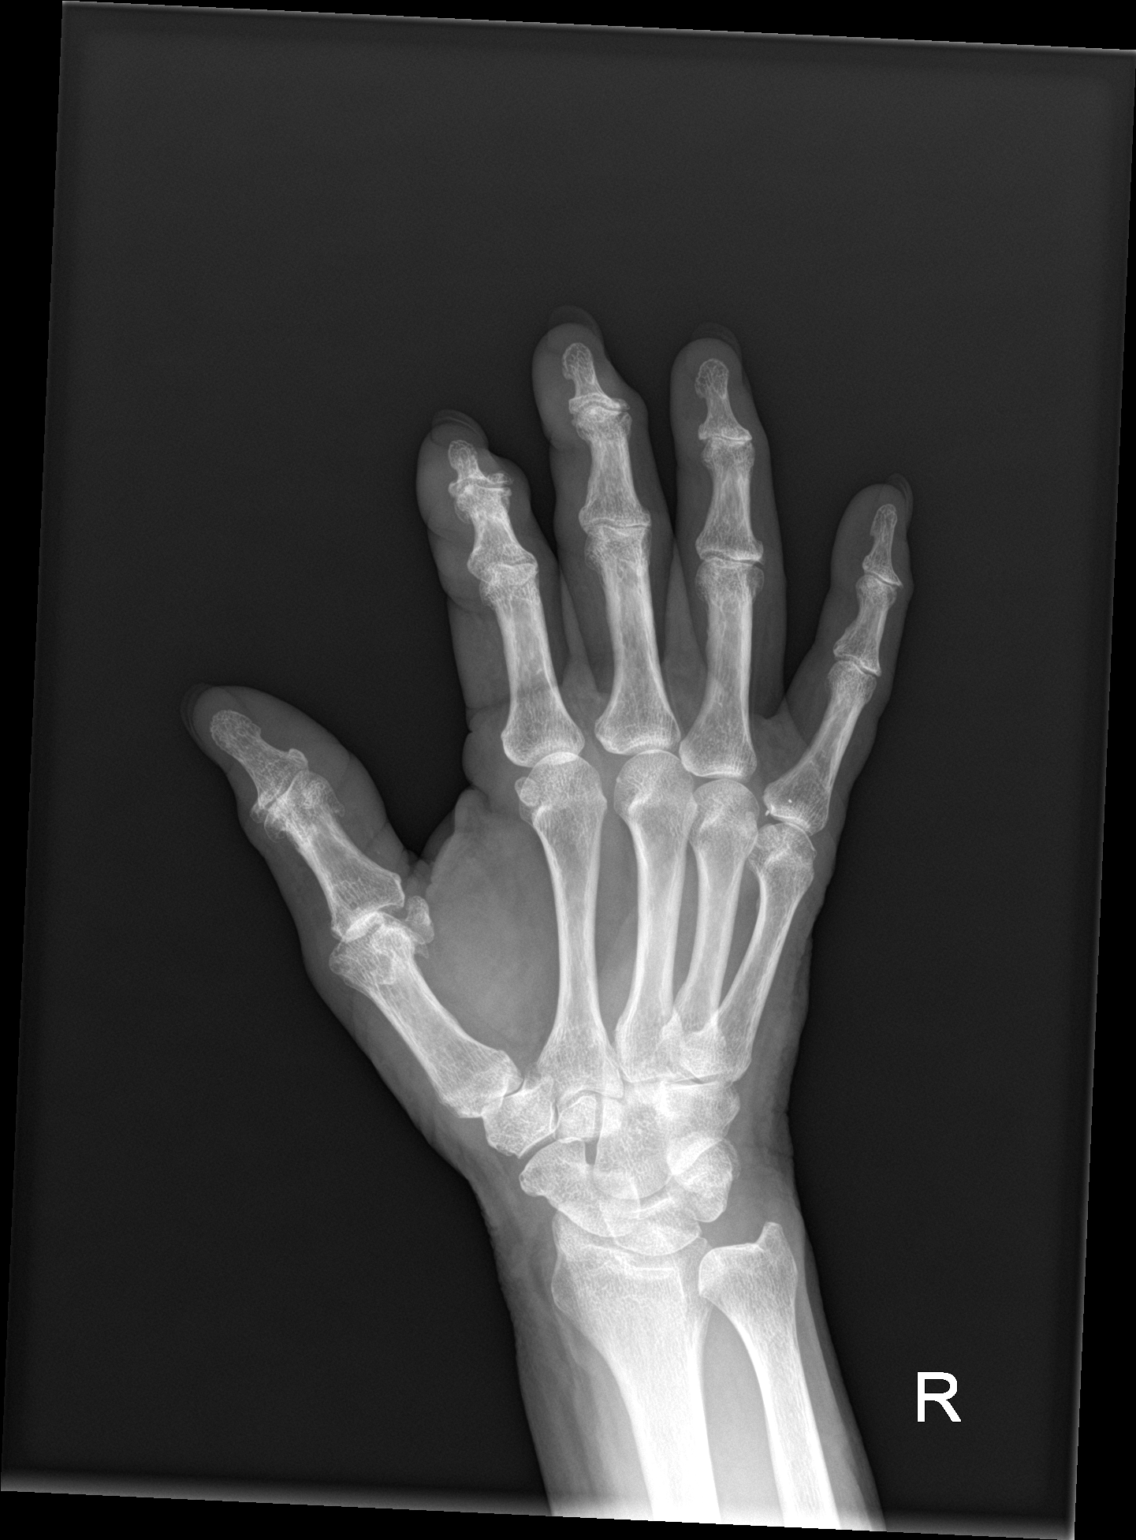

[hand lat]
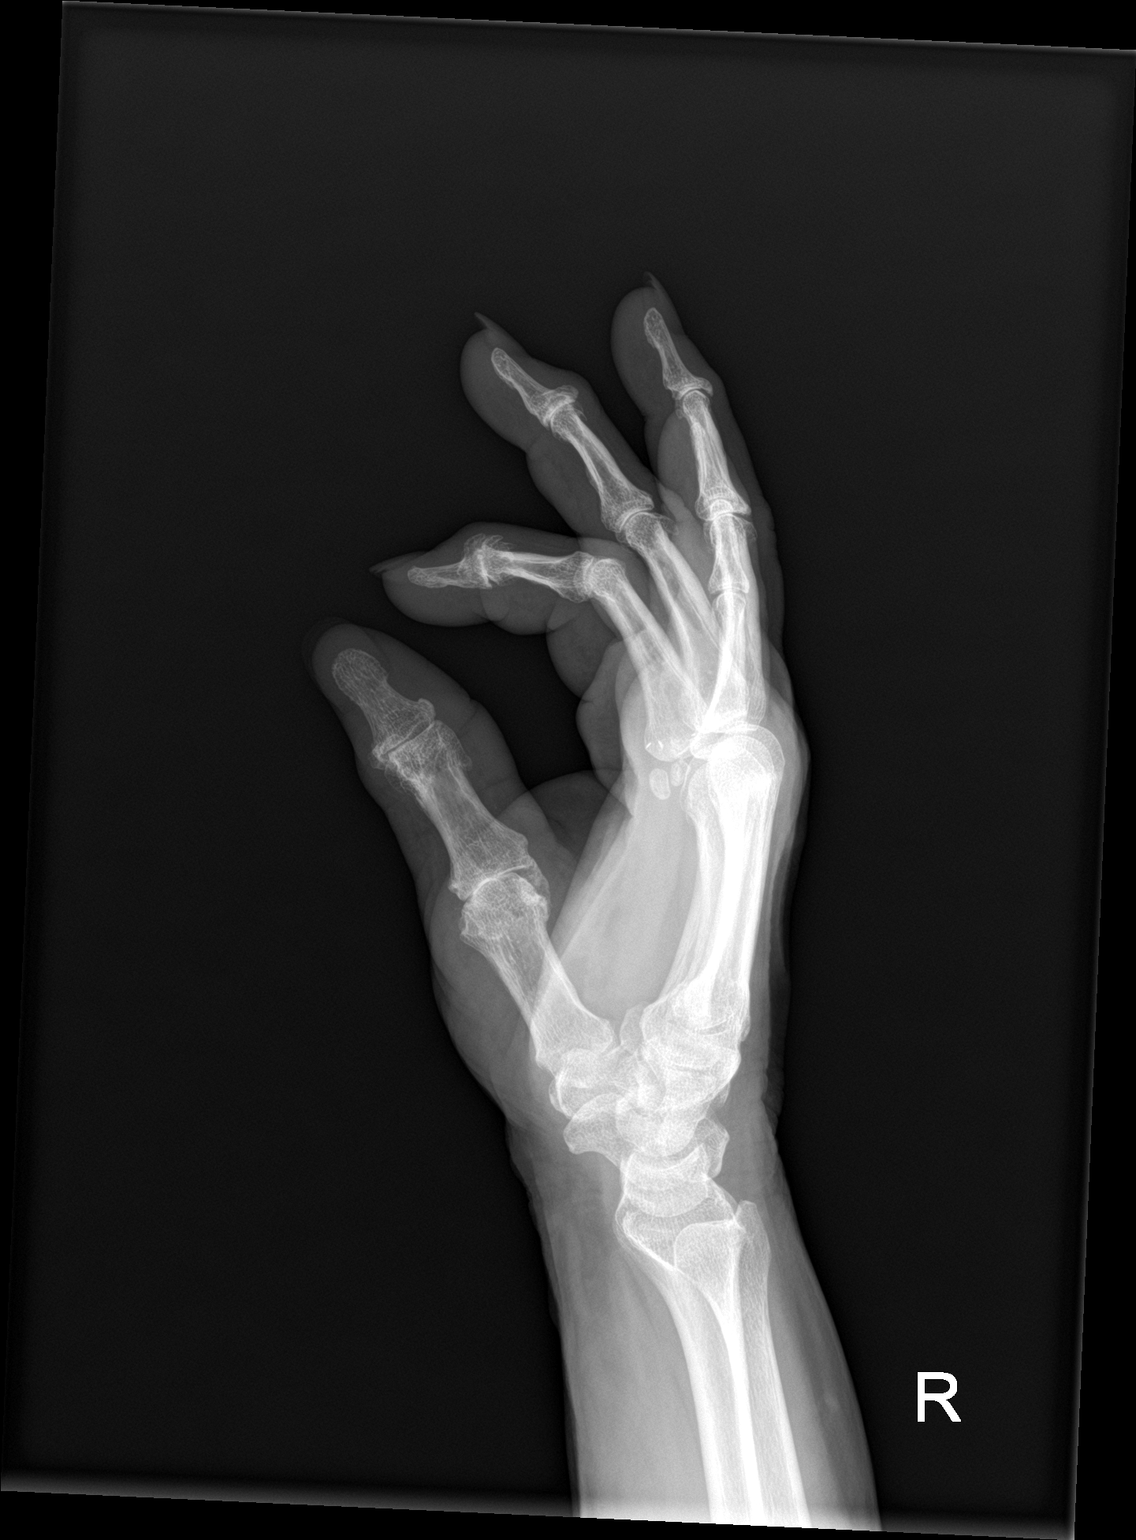

[3 of 3 positions shown; findings below may reference images not displayed]

FINDINGS: Severe thumb metacarpophalangeal and second and third DIP joint
space narrowing. Moderate to severe thumb interphalangeal and fourth
and fifth DIP joint space narrowing. Moderate thumb carpometacarpal
joint space narrowing. Peripheral degenerative osteophytes in these
regions of joint space narrowing. No acute fracture or dislocation.
Mild developmental ulnar positive variance.
IMPRESSION: Osteoarthritis, greatest within the thumb metacarpophalangeal, thumb
interphalangeal, and second and third DIP joints.

## 2023-03-05 ENCOUNTER — Ambulatory Visit: Payer: PPO | Admitting: Internal Medicine

## 2023-03-23 ENCOUNTER — Ambulatory Visit (INDEPENDENT_AMBULATORY_CARE_PROVIDER_SITE_OTHER): Payer: PPO | Admitting: Family Medicine

## 2023-03-23 ENCOUNTER — Encounter: Payer: Self-pay | Admitting: Family Medicine

## 2023-03-23 DIAGNOSIS — M159 Polyosteoarthritis, unspecified: Secondary | ICD-10-CM | POA: Diagnosis not present

## 2023-03-23 DIAGNOSIS — E7849 Other hyperlipidemia: Secondary | ICD-10-CM | POA: Diagnosis not present

## 2023-03-23 DIAGNOSIS — E038 Other specified hypothyroidism: Secondary | ICD-10-CM | POA: Diagnosis not present

## 2023-03-23 DIAGNOSIS — I10 Essential (primary) hypertension: Secondary | ICD-10-CM

## 2023-03-23 DIAGNOSIS — K219 Gastro-esophageal reflux disease without esophagitis: Secondary | ICD-10-CM

## 2023-03-23 DIAGNOSIS — R5383 Other fatigue: Secondary | ICD-10-CM | POA: Insufficient documentation

## 2023-03-23 DIAGNOSIS — G4700 Insomnia, unspecified: Secondary | ICD-10-CM | POA: Diagnosis not present

## 2023-03-23 MED ORDER — ROSUVASTATIN CALCIUM 20 MG PO TABS: 20.0000 mg | ORAL_TABLET | Freq: Every day | ORAL | 1 refills | Status: DC

## 2023-03-23 MED ORDER — PANTOPRAZOLE SODIUM 40 MG PO TBEC: 40.0000 mg | DELAYED_RELEASE_TABLET | Freq: Every day | ORAL | 1 refills | Status: DC

## 2023-03-23 MED ORDER — LOSARTAN POTASSIUM 100 MG PO TABS: ORAL_TABLET | ORAL | 3 refills | Status: DC

## 2023-03-23 NOTE — Assessment & Plan Note (Signed)
Chronic fatigue.  Likely multifactorial.  Checking B12.  TSH today.

## 2023-03-23 NOTE — Assessment & Plan Note (Signed)
Well-controlled.  Patient is losartan was refilled.

## 2023-03-23 NOTE — Patient Instructions (Signed)
Meds refilled.  Labs today.  Use tylenol PM instead of Advil PM.  Okay to use the supplement.  Take care  Dr. Adriana Simas

## 2023-03-23 NOTE — Progress Notes (Signed)
Subjective:  Patient ID: [name], female    DOB: 1956/08/14  Age: 67 y.o. MRN: [id number]  CC: Chief Complaint  Patient presents with   discuss thyroid and other meds taking    Will taking advil pm interfere as well joint    Fatigue    Would like to check B12 and possible reasons for being very tired and insomina    HPI:  66 year old female with the below mentioned medical problems presents for evaluation of the above.  Patient endorses chronic fatigue.  She states that she has a prior history of vitamin B12 deficiency.  She would like her B12 level to be drawn.  Patient states that she currently takes Advil PM which helps her sleep well.  She wants to know if this medication is safe for her to take and if I am okay with that.  We will discuss this today.  Patient's most recent TSH was abnormal.  Will recheck.  Patient is taking a supplement regarding joint health.  She wants to know if she can take this with her other medications.  Lastly, patient states that she needs a couple refills on her medications.  Patient Active Problem List   Diagnosis Date Noted   Other fatigue 03/23/2023   Insomnia 03/23/2023   Obesity (BMI 30-39.9) 01/15/2023   BPPV (benign paroxysmal positional vertigo) 01/15/2023   Primary osteoarthritis involving multiple joints 05/15/2022   Asthma 03/04/2022   CAD (coronary artery disease) 03/04/2022   Anxiety and depression 03/04/2022   Healthcare maintenance 09/18/2021   IBS (irritable bowel syndrome) 07/31/2021   Prediabetes 12/13/2017   Hyperlipidemia 12/04/2016   Gastroesophageal reflux disease 09/19/2012   Essential hypertension 07/25/2012   Hypothyroidism 07/25/2012    Social Hx   Social History   Socioeconomic History   Marital status: Married    Spouse name: Not on file   Number of children: Not on file   Years of education: 12   Highest education level: Not on file  Occupational History   Not on file  Tobacco Use   Smoking  status: Former    Packs/day: 1.50    Years: 27.00    Additional pack years: 0.00    Total pack years: 40.50    Types: Cigarettes    Start date: 87    Quit date: 2000    Years since quitting: 24.3   Smokeless tobacco: Never  Vaping Use   Vaping Use: Never used  Substance and Sexual Activity   Alcohol use: No   Drug use: No   Sexual activity: Not on file  Other Topics Concern   Not on file  Social History Narrative   Not on file   Social Determinants of Health   Financial Resource Strain: Not on file  Food Insecurity: Not on file  Transportation Needs: Not on file  Physical Activity: Not on file  Stress: Not on file  Social Connections: Not on file    Review of Systems Per HPI  Objective:  BP 118/80   Pulse (!) 101   Temp 97.7 F (36.5 C)   Ht  (1.575 m)   Wt 176 lb (79.8 kg)   SpO2 95%   BMI 32.19 kg/m      03/23/2023    1:40 PM 01/15/2023    3:47 PM 01/15/2023    3:40 PM  BP/Weight  Systolic BP 118 139 158  Diastolic BP 80 87 82  Wt. (Lbs) 176  178  BMI 32.19 kg/m2  32.56 kg/m2    Physical Exam Vitals and nursing note reviewed.  Constitutional:      General: She is not in acute distress. HENT:     Head: Normocephalic and atraumatic.  Cardiovascular:     Rate and Rhythm: Normal rate and regular rhythm.  Pulmonary:     Effort: Pulmonary effort is normal.     Breath sounds: Normal breath sounds.  Neurological:     Mental Status: She is alert.  Psychiatric:        Mood and Affect: Mood normal.        Behavior: Behavior normal.     Lab Results  Component Value Date   WBC 11.0 (H) 01/14/2023   HGB 13.9 01/14/2023   HCT 42.6 01/14/2023   PLT 237 01/14/2023   GLUCOSE 84 01/14/2023   CHOL 147 01/14/2023   TRIG 76 01/14/2023   HDL 74 01/14/2023   LDLCALC 58 01/14/2023   ALT 27 01/14/2023   AST 21 01/14/2023   NA 141 01/14/2023   K 4.4 01/14/2023   CL 104 01/14/2023   CREATININE 0.84 01/14/2023   BUN 15 01/14/2023   CO2 25  01/14/2023   TSH 0.358 (L) 01/14/2023   INR 0.9 10/21/2007   HGBA1C 5.8 (H) 06/16/2021     Assessment & Plan:   Problem List Items Addressed This Visit       Cardiovascular and Mediastinum   Essential hypertension    Well-controlled.  Patient is losartan was refilled.      Relevant Medications   losartan (COZAAR) 100 MG tablet   rosuvastatin (CRESTOR) 20 MG tablet     Digestive   Gastroesophageal reflux disease    Stable.  Patient needs refill on Protonix.  Refilled today.      Relevant Medications   pantoprazole (PROTONIX) 40 MG tablet     Endocrine   Hypothyroidism    Rechecking TSH today.      Relevant Orders   TSH     Musculoskeletal and Integument   Primary osteoarthritis involving multiple joints    I advised the patient that her supplements should not cause any harm but does not need to be taken close to the time that she takes her Synthroid.        Other   Other fatigue    Chronic fatigue.  Likely multifactorial.  Checking B12.  TSH today.      Relevant Orders   Vitamin B12   Insomnia    Advised against use of Advil PM.  I advised the patient that the diphenhydramine and Tylenol and Advil PM can cause dizziness may lead to falls.  Patient states that it works well for her.  I advised to try Tylenol PM if she is okay with the risk.      Hyperlipidemia    Statin refilled.      Relevant Medications   losartan (COZAAR) 100 MG tablet   rosuvastatin (CRESTOR) 20 MG tablet    Meds ordered this encounter  Medications   losartan (COZAAR) 100 MG tablet    Sig: TAKE (1) TABLET BY MOUTH ONCE DAILY.    Dispense:  90 tablet    Refill:  3   rosuvastatin (CRESTOR) 20 MG tablet    Sig: Take 1 tablet (20 mg total) by mouth daily.    Dispense:  90 tablet    Refill:  1   pantoprazole (PROTONIX) 40 MG tablet    Sig: Take 1 tablet (40 mg total) by mouth daily.  Dispense:  90 tablet    Refill:  1    Ilea Hilton DO [facility]

## 2023-03-23 NOTE — Assessment & Plan Note (Signed)
Stable.  Patient needs refill on Protonix.  Refilled today.

## 2023-03-23 NOTE — Assessment & Plan Note (Signed)
Rechecking TSH today

## 2023-03-23 NOTE — Assessment & Plan Note (Signed)
Statin refilled.

## 2023-03-23 NOTE — Assessment & Plan Note (Signed)
Advised against use of Advil PM.  I advised the patient that the diphenhydramine and Tylenol and Advil PM can cause dizziness may lead to falls.  Patient states that it works well for her.  I advised to try Tylenol PM if she is okay with the risk.

## 2023-03-23 NOTE — Assessment & Plan Note (Signed)
I advised the patient that her supplements should not cause any harm but does not need to be taken close to the time that she takes her Synthroid.

## 2023-03-24 ENCOUNTER — Other Ambulatory Visit: Payer: Self-pay | Admitting: Family Medicine

## 2023-03-24 LAB — TSH: TSH: 0.294 u[IU]/mL — ABNORMAL LOW (ref 0.450–4.500)

## 2023-03-24 LAB — VITAMIN B12: Vitamin B-12: 364 pg/mL (ref 232–1245)

## 2023-03-24 MED ORDER — LEVOTHYROXINE SODIUM 125 MCG PO TABS: ORAL_TABLET | ORAL | 0 refills | Status: DC

## 2023-03-31 ENCOUNTER — Ambulatory Visit (INDEPENDENT_AMBULATORY_CARE_PROVIDER_SITE_OTHER): Payer: PPO | Admitting: Allergy & Immunology

## 2023-03-31 ENCOUNTER — Other Ambulatory Visit: Payer: Self-pay

## 2023-03-31 ENCOUNTER — Encounter: Payer: Self-pay | Admitting: Allergy & Immunology

## 2023-03-31 VITALS — BP 130/66 | HR 102 | Temp 98.7°F | Resp 18 | Ht 61.5 in | Wt 175.5 lb

## 2023-03-31 DIAGNOSIS — J302 Other seasonal allergic rhinitis: Secondary | ICD-10-CM | POA: Diagnosis not present

## 2023-03-31 DIAGNOSIS — K219 Gastro-esophageal reflux disease without esophagitis: Secondary | ICD-10-CM | POA: Diagnosis not present

## 2023-03-31 DIAGNOSIS — J4541 Moderate persistent asthma with (acute) exacerbation: Secondary | ICD-10-CM

## 2023-03-31 DIAGNOSIS — J4540 Moderate persistent asthma, uncomplicated: Secondary | ICD-10-CM

## 2023-03-31 DIAGNOSIS — J3089 Other allergic rhinitis: Secondary | ICD-10-CM

## 2023-03-31 MED ORDER — ALBUTEROL SULFATE HFA 108 (90 BASE) MCG/ACT IN AERS: 1.0000 | INHALATION_SPRAY | Freq: Four times a day (QID) | RESPIRATORY_TRACT | 0 refills | Status: DC | PRN

## 2023-03-31 MED ORDER — BREZTRI AEROSPHERE 160-9-4.8 MCG/ACT IN AERO: 2.0000 | INHALATION_SPRAY | Freq: Two times a day (BID) | RESPIRATORY_TRACT | 5 refills | Status: DC

## 2023-03-31 MED ORDER — ALBUTEROL SULFATE HFA 108 (90 BASE) MCG/ACT IN AERS: 1.0000 | INHALATION_SPRAY | Freq: Four times a day (QID) | RESPIRATORY_TRACT | 1 refills | Status: DC | PRN

## 2023-03-31 MED ORDER — MONTELUKAST SODIUM 10 MG PO TABS: 10.0000 mg | ORAL_TABLET | Freq: Every day | ORAL | 5 refills | Status: DC

## 2023-03-31 MED ORDER — BUDESONIDE 0.5 MG/2ML IN SUSP: 0.5000 mg | Freq: Two times a day (BID) | RESPIRATORY_TRACT | 2 refills | Status: DC

## 2023-03-31 MED ORDER — IPRATROPIUM-ALBUTEROL 0.5-2.5 (3) MG/3ML IN SOLN
RESPIRATORY_TRACT | 5 refills | Status: DC
Start: 2023-03-31 — End: 2023-06-21

## 2023-03-31 MED ORDER — FLUTICASONE PROPIONATE 50 MCG/ACT NA SUSP: 2.0000 | Freq: Every day | NASAL | 5 refills | Status: DC

## 2023-03-31 MED ORDER — TEZEPELUMAB-EKKO 210 MG/1.91ML ~~LOC~~ SOSY
210.0000 mg | PREFILLED_SYRINGE | SUBCUTANEOUS | Status: DC
  Administered 2023-03-31: 210 mg via SUBCUTANEOUS

## 2023-03-31 MED ORDER — BUDESONIDE 0.5 MG/2ML IN SUSP: RESPIRATORY_TRACT | 2 refills | Status: DC

## 2023-03-31 NOTE — Progress Notes (Signed)
Immunotherapy   Patient Details  Name: [name] MRN: [id number] Date of Birth: [birth year]  03/31/2023  [name] started injections for  Tezspire Following schedule: Every twenty eight days Frequency:Every four weeks Epi-Pen:Not needed Consent signed in office today and patient instructions given. Patient waited in room one for thirty mintes without an issue.   [name] 03/31/2023, 4:23 PM

## 2023-03-31 NOTE — Patient Instructions (Addendum)
1. Moderate persistent asthma, uncomplicated - Lung testing looked great today. - We started Tezspire today to see if this would help your [name] work more effectively. - Tammy will reach out about the approval process for Tezspire. - Daily controller medication(s): Breztri two puffs twice daily with spacer  - Prior to physical activity: albuterol 2 puffs 10-15 minutes before physical activity. - Rescue medications: albuterol 4 puffs every 4-6 hours as needed - Changes during respiratory infections or worsening symptoms: Add on DuoNeb/budesonide one treatment via nebulizer twice daily for ONE TO TWO WEEKS.  - Asthma control goals:  * Full participation in all desired activities (may need albuterol before activity) * Albuterol use two time or less a week on average (not counting use with activity) * Cough interfering with sleep two time or less a month * Oral steroids no more than once a year * No hospitalizations  2. Gastroesophageal reflux disease - I would consider taking a daily reflux medication in case you have "silent reflux" that is making your lungs angrier. - You could try something like Pepcid (famotidine)  daily (this is less strong than the Protonix that you were taking).  3. Seasonal and perennial allergic rhinitis - Continue with Flonase two sprays per nostril daily.  - Continue with Singulair  daily.   4. Return in about 8 weeks (around 05/26/2023). You can have the follow up appointment with Dr. Dellis Anes or a Nurse Practicioner (our Nurse Practitioners are excellent and always have Physician oversight!).    Please inform us of any Emergency Department visits, hospitalizations, or changes in symptoms. Call us before going to the ED for breathing or allergy symptoms since we might be able to fit you in for a sick visit. Feel free to contact us anytime with any questions, problems, or concerns.  It was a pleasure to see you again today!  Websites that have reliable  patient information: 1. American Academy of Asthma, Allergy, and Immunology: www.aaaai.org 2. Food Allergy Research and Education (FARE): foodallergy.org 3. Mothers of Asthmatics: http://www.asthmacommunitynetwork.org 4. American College of Allergy, Asthma, and Immunology: www.acaai.org   COVID-19 Vaccine Information can be found at: [email or web address].[email or web address] For questions related to vaccine distribution or appointments, please email [email or web address] or call [phone].   We realize that you might be concerned about having an allergic reaction to the COVID19 vaccines. To help with that concern, WE ARE OFFERING THE COVID19 VACCINES IN OUR OFFICE! Ask the front desk for dates!     "Like" [name] on Facebook and Instagram for our latest updates!      A healthy democracy works best when [employer] participate! Make sure you are registered to vote! If you have moved or changed any of your contact information, you will need to get this updated before voting!  In some cases, you MAY be able to register to vote online: [email or web address]

## 2023-03-31 NOTE — Progress Notes (Signed)
FOLLOW UP  Date of Service/Encounter:  03/31/23   Assessment:   Moderate persistent asthma - with worsening control as of late (gave sample of Tezspire today)   Absolute eosinophil count ranging from 200-300   Seasonal and perennial allergic rhinitis (ragweed, weeds, trees and dust mites)   Inability to tolerate antihistamines (overly drying)    Plan/Recommendations:   1. Moderate persistent asthma, uncomplicated - Lung testing looked great today. - We started Tezspire today to see if this would help your [name] work more effectively. - Tammy will reach out about the approval process for Tezspire. - Daily controller medication(s): Breztri two puffs twice daily with spacer  - Prior to physical activity: albuterol 2 puffs 10-15 minutes before physical activity. - Rescue medications: albuterol 4 puffs every 4-6 hours as needed - Changes during respiratory infections or worsening symptoms: Add on DuoNeb/budesonide one treatment via nebulizer twice daily for ONE TO TWO WEEKS.  - Asthma control goals:  * Full participation in all desired activities (may need albuterol before activity) * Albuterol use two time or less a week on average (not counting use with activity) * Cough interfering with sleep two time or less a month * Oral steroids no more than once a year * No hospitalizations  2. Gastroesophageal reflux disease - I would consider taking a daily reflux medication in case you have "silent reflux" that is making your lungs angrier. - You could try something like Pepcid (famotidine) 40mg  daily (this is less strong than the Protonix that you were taking).  3. Seasonal and perennial allergic rhinitis - Continue with Flonase two sprays per nostril daily.  - Continue with Singulair 10mg  daily.   4. Return in about 8 weeks (around 05/26/2023). You can have the follow up appointment with Dr. Dellis Anes or a Nurse Practicioner (our Nurse Practitioners are excellent and always have  Physician oversight!).    Subjective:   [name] is a 67 y.o. female presenting today for follow up of  Chief Complaint  Patient presents with   Follow-up    Has had two asthma flares since she was last seen. Having a hard time breathing when the temperature gets hot.     [name] has a history of the following: Patient Active Problem List   Diagnosis Date Noted   Other fatigue 03/23/2023   Insomnia 03/23/2023   Obesity (BMI 30-39.9) 01/15/2023   BPPV (benign paroxysmal positional vertigo) 01/15/2023   Primary osteoarthritis involving multiple joints 05/15/2022   Asthma 03/04/2022   CAD (coronary artery disease) 03/04/2022   Anxiety and depression 03/04/2022   Healthcare maintenance 09/18/2021   IBS (irritable bowel syndrome) 07/31/2021   Prediabetes 12/13/2017   Hyperlipidemia 12/04/2016   Gastroesophageal reflux disease 09/19/2012   Essential hypertension 07/25/2012   Hypothyroidism 07/25/2012    History obtained from: chart review and patient.  [name] is a 67 y.o. female presenting for a follow up visit.  She was last seen by me in January 2023.  At that time, her lung testing looked good.  We started her on a longer prednisone taper as well as cefdinir 30 mg twice daily for 2 weeks.  We had a chest x-ray at [facility].  She saw our nurse practitioner 3 days later and her [name] was increased to 2 puffs twice a day she was started on montelukast.  For her allergic rhinitis, we continued with avoidance measures as well as Flonase.  For her reflux, we restarted pantoprazole.  Since last  visit, she has done well.   Asthma/Respiratory Symptom History: She has remains on Breztri two puffs twice daily. She will use some extra puffs when she is outdoors. She  has had two asthma attacks since we saw her. These were all triggered by going outside. She had one on February 4th. Another time was related to cleaning out her basement. The other time, she does not remember what  was brought. But it feels like a cold but then worsens to asthma. She does get steroids and it helps her to breathe better. Her symptoms are definitely responsive to prednisone.  She has been needing prednisone more more often.  She does not feel like her asthma is under excellent control.  Allergic Rhinitis Symptom History: She remains on Flonase as well as Singulair.  This seems to be controlling her symptoms very well.  She has not been on antibiotics.  GERD Symptom History: She does not think she has much in the way of reflux.  I did explain the process of silent reflux to her, she does not really want to start anything for her reflux.  If she did decide to start something, I recommended starting with Pepcid 40 mg daily.  Otherwise, there have been no changes to her past medical history, surgical history, family history, or social history.    Review of Systems  Constitutional:  Negative for chills, fever, malaise/fatigue and weight loss.  HENT: Negative.  Negative for congestion, ear discharge, ear pain and sinus pain.   Eyes:  Negative for pain, discharge and redness.  Respiratory:  Positive for cough and shortness of breath. Negative for sputum production and wheezing.   Cardiovascular: Negative.  Negative for chest pain and palpitations.  Gastrointestinal:  Negative for abdominal pain, constipation, diarrhea, heartburn, nausea and vomiting.  Skin: Negative.  Negative for itching and rash.  Neurological:  Negative for dizziness and headaches.  Endo/Heme/Allergies:  Positive for environmental allergies. Does not bruise/bleed easily.       Objective:   Blood pressure 130/66, pulse (!) 102, temperature 98.7 F (37.1 C), resp. rate 18, height 5' 1.5" (1.562 m), weight 175 lb 8 oz (79.6 kg), SpO2 94 %. Body mass index is 32.62 kg/m.    Physical Exam Vitals reviewed.  Constitutional:      Appearance: She is well-developed. She is not ill-appearing.     Comments: Smiling.  HENT:      Head: Normocephalic and atraumatic.     Right Ear: Tympanic membrane, ear canal and external ear normal.     Left Ear: Tympanic membrane, ear canal and external ear normal.     Nose: No nasal deformity, septal deviation, mucosal edema or rhinorrhea.     Right Turbinates: Enlarged, swollen and pale.     Left Turbinates: Enlarged and pale.     Right Sinus: No maxillary sinus tenderness or frontal sinus tenderness.     Left Sinus: No maxillary sinus tenderness or frontal sinus tenderness.     Comments: Turbinates erythematous.    Mouth/Throat:     Mouth: Mucous membranes are not pale and not dry.     Pharynx: Uvula midline.  Eyes:     General: Lids are normal. No allergic shiner.       Right eye: No discharge.        Left eye: No discharge.     Conjunctiva/sclera: Conjunctivae normal.     Right eye: Right conjunctiva is not injected. No chemosis.    Left eye: Left conjunctiva is  not injected. No chemosis.    Pupils: Pupils are equal, round, and reactive to light.  Cardiovascular:     Rate and Rhythm: Normal rate and regular rhythm.     Heart sounds: Normal heart sounds.  Pulmonary:     Effort: Pulmonary effort is normal. No tachypnea, accessory muscle usage or respiratory distress.     Breath sounds: Examination of the right-lower field reveals wheezing and rhonchi. Examination of the left-lower field reveals wheezing and rhonchi. Wheezing and rhonchi present. No rales.     Comments: She is able to make full sentences. Chest:     Chest wall: No tenderness.  Lymphadenopathy:     Cervical: No cervical adenopathy.  Skin:    Coloration: Skin is not pale.     Findings: No abrasion, erythema, petechiae or rash. Rash is not papular, urticarial or vesicular.  Neurological:     Mental Status: She is alert.  Psychiatric:        Behavior: Behavior is cooperative.      Diagnostic studies:    Spirometry: results normal (FEV1: 1.81/87%, FVC: 2.27/85%, FEV1/FVC: 80%).    Spirometry  consistent with normal pattern.   Allergy Studies: none    Tezspire sample given in clinic.  She tolerated this without a problem.      Malachi Bonds, MD  [facility]

## 2023-04-08 ENCOUNTER — Telehealth: Payer: Self-pay | Admitting: *Deleted

## 2023-04-08 NOTE — Telephone Encounter (Signed)
Spoke to patient and discussed PAP for Tezspire and will mail her appt and be in touch regarding status

## 2023-04-08 NOTE — Telephone Encounter (Signed)
-----   Message from Alfonse Spruce, MD sent at 04/02/2023  5:34 AM EDT ----- [employer] TEZSPIRE

## 2023-04-09 NOTE — Telephone Encounter (Signed)
Thanks, Tammy!   Shanee Batch, MD [facility]  

## 2023-04-27 ENCOUNTER — Encounter: Payer: Self-pay | Admitting: Emergency Medicine

## 2023-04-27 ENCOUNTER — Ambulatory Visit
Admission: EM | Admit: 2023-04-27 | Discharge: 2023-04-27 | Disposition: A | Discharge status: Home / Self Care | Payer: PPO | Attending: Nurse Practitioner | Admitting: Nurse Practitioner

## 2023-04-27 DIAGNOSIS — B372 Candidiasis of skin and nail: Secondary | ICD-10-CM | POA: Diagnosis not present

## 2023-04-27 DIAGNOSIS — N760 Acute vaginitis: Secondary | ICD-10-CM

## 2023-04-27 LAB — POCT URINALYSIS DIP (MANUAL ENTRY)
Bilirubin, UA: NEGATIVE
Blood, UA: NEGATIVE
Glucose, UA: NEGATIVE mg/dL
Ketones, POC UA: NEGATIVE mg/dL
Nitrite, UA: NEGATIVE
Protein Ur, POC: NEGATIVE mg/dL
Spec Grav, UA: 1.02 (ref 1.010–1.025)
Urobilinogen, UA: 0.2 E.U./dL
pH, UA: 7 (ref 5.0–8.0)

## 2023-04-27 MED ORDER — NYSTATIN 100000 UNIT/GM EX POWD: 1.0000 | Freq: Three times a day (TID) | CUTANEOUS | 0 refills | Status: DC

## 2023-04-27 MED ORDER — FLUCONAZOLE 150 MG PO TABS: ORAL_TABLET | ORAL | 0 refills | Status: DC

## 2023-04-27 MED ORDER — NYSTATIN 100000 UNIT/GM EX CREA: TOPICAL_CREAM | CUTANEOUS | 0 refills | Status: DC

## 2023-04-27 NOTE — Discharge Instructions (Addendum)
Use medication as prescribed. May take over-the-counter Benadryl as needed for itching or discomfort. Continue use of cotton underwear.  Also recommend sun dresses, or clothing that reads. Avoid scratching or rubbing the areas while symptoms persist. Wipe from front to back after using the toilet. Keep the areas clean and dry. Recommend eating yogurt to help with vaginal symptoms. As discussed, if symptoms or not improving over the next 48 hours, please follow-up in this clinic for further evaluation. Follow-up as needed.

## 2023-04-27 NOTE — ED Provider Notes (Signed)
[facility]    CSN: [id number] Arrival date & time: 04/27/23  1330      History   Chief Complaint No chief complaint on file.   HPI [name] is a 67 y.o. female.   The history is provided by the patient.   Patient presents for complaints of vaginal itching and itching in the bilateral groin area that has been present for 1 week..  Patient states that she started with pressure in her lower pelvic region with the vaginal itching.  Patient states that the pressure in her lower pelvic region has since resolved.  She states she tried over-the-counter vaginal creams on 2 occasions with no relief of her symptoms.  Patient states over the last several days, the itching has become worse.  Patient reports prior to her vaginal symptoms, she did complete a course of clindamycin for her face.  Patient denies fever, chills, abdominal pain, vaginal discharge, vaginal odor, urinary frequency, urgency, hesitancy, or flank pain.  Patient denies history of recurrent yeast infections.    Past Medical History:  Diagnosis Date   Anxiety    Arthritis    Depression    GERD (gastroesophageal reflux disease)    Hypertension    Hypothyroid    Hypothyroidism    Moderate persistent asthma 01/20/2021   PONV (postoperative nausea and vomiting)    Recurrent upper respiratory infection (URI)    Urticaria     Patient Active Problem List   Diagnosis Date Noted   Other fatigue 03/23/2023   Insomnia 03/23/2023   Obesity (BMI 30-39.9) 01/15/2023   BPPV (benign paroxysmal positional vertigo) 01/15/2023   Primary osteoarthritis involving multiple joints 05/15/2022   Asthma 03/04/2022   CAD (coronary artery disease) 03/04/2022   Anxiety and depression 03/04/2022   Healthcare maintenance 09/18/2021   IBS (irritable bowel syndrome) 07/31/2021   Prediabetes 12/13/2017   Hyperlipidemia 12/04/2016   Gastroesophageal reflux disease 09/19/2012   Essential hypertension 07/25/2012   Hypothyroidism  07/25/2012    Past Surgical History:  Procedure Laterality Date   BACK SURGERY     BIOPSY  06/03/2020   Procedure: BIOPSY;  Surgeon: Malissa Hippo, MD;  Location: [facility];  Service: Endoscopy;;   CARPAL TUNNEL RELEASE  06/16/2012   Procedure: CARPAL TUNNEL RELEASE;  Surgeon: Dominica Severin, MD;  Location: [facility];  Service: Orthopedics;  Laterality: Right;  limited open carpal tunnel release   CERVICAL FUSION  2018   CHOLECYSTECTOMY     COLONOSCOPY     COLONOSCOPY WITH PROPOFOL N/A 06/03/2020   Procedure: COLONOSCOPY WITH PROPOFOL;  Surgeon: Malissa Hippo, MD;  Location: [facility];  Service: Endoscopy;  Laterality: N/A;  115   ESOPHAGOGASTRODUODENOSCOPY (EGD) WITH PROPOFOL N/A 06/03/2020   Procedure: ESOPHAGOGASTRODUODENOSCOPY (EGD) WITH PROPOFOL;  Surgeon: Malissa Hippo, MD;  Location: [facility];  Service: Endoscopy;  Laterality: N/A;   TONSILLECTOMY     TRIGGER FINGER RELEASE  06/16/2012   Procedure: RELEASE TRIGGER FINGER/A-1 PULLEY;  Surgeon: Dominica Severin, MD;  Location: [facility];  Service: Orthopedics;  Laterality: Right;  right middle a-1 release   TUBAL LIGATION     UPPER GASTROINTESTINAL ENDOSCOPY     VAGINAL HYSTERECTOMY      OB History   No obstetric history on file.      Home Medications    Prior to Admission medications   Medication Sig Start Date End Date Taking? Authorizing Provider  fluconazole (DIFLUCAN) 150 MG tablet Take 1  tablet by mouth every 72 hours as needed. 04/27/23  Yes Jeniel Slauson-Warren, Sadie Haber, NP  nystatin (MYCOSTATIN/NYSTOP) powder Apply 1 Application topically 3 (three) times daily. 04/27/23  Yes Kieu Quiggle-Warren, Sadie Haber, NP  nystatin cream (MYCOSTATIN) Apply to affected area 2 times daily 04/27/23  Yes Aryan Sparks-Warren, Sadie Haber, NP  albuterol (VENTOLIN HFA) 108 (90 Base) MCG/ACT inhaler Inhale 1-2 puffs into the lungs every 6 (six) hours as needed for wheezing or shortness of breath. 03/31/23    Alfonse Spruce, MD  Budeson-Glycopyrrol-Formoterol (BREZTRI AEROSPHERE) 160-9-4.8 MCG/ACT AERO Inhale 2 puffs into the lungs in the morning and at bedtime. 03/31/23   Alfonse Spruce, MD  budesonide (PULMICORT) 0.5 MG/2ML nebulizer solution Mix one vial with DuoNeb solution and administer via nebulizer every 4 hours as needed for respiratory flares. 03/31/23   Alfonse Spruce, MD  fluorometholone (FML) 0.1 % ophthalmic suspension SMARTSIG:In Eye(s) 03/24/23   [provider]  fluticasone (FLONASE) 50 MCG/ACT nasal spray Place 2 sprays into both nostrils daily. 03/31/23   Alfonse Spruce, MD  gabapentin (NEURONTIN) 100 MG capsule Take 100 mg by mouth at bedtime as needed (back pain).    [provider]  ipratropium-albuterol (DUONEB) 0.5-2.5 (3) MG/3ML SOLN Mix one vial with budesonide solution and administer via nebulizer every 4 hours as needed for respiratory flares. 03/31/23   Alfonse Spruce, MD  levothyroxine (SYNTHROID) 125 MCG tablet TAKE (1) TABLET BY MOUTH ONCE DAILY BEFORE BREAKFAST. 03/24/23   [name], DO  linaclotide (LINZESS) 72 MCG capsule Take 1 capsule (72 mcg total) by mouth daily before breakfast. 12/25/21   Marguerita Merles, Reuel Boom, MD  losartan (COZAAR) 100 MG tablet TAKE (1) TABLET BY MOUTH ONCE DAILY. 03/23/23   [name], DO  montelukast (SINGULAIR) 10 MG tablet Take 1 tablet (10 mg total) by mouth at bedtime. 03/31/23   Alfonse Spruce, MD  polyethylene glycol powder [facility]) 17 GM/SCOOP powder Take 17 g by mouth daily. 06/03/20   Rehman, Joline Maxcy, MD  rosuvastatin (CRESTOR) 20 MG tablet Take 1 tablet (20 mg total) by mouth daily. 03/23/23   [name], DO  saline (AYR) GEL Place 1 application into both nostrils daily as needed (dryness).    [provider]  sertraline (ZOLOFT) 100 MG tablet TAKE 1 AND 1/2 TABLETS BY MOUTH ONCE DAILY. 03/04/22   [name], DO  Sodium Fluoride (PREVIDENT 5000  BOOSTER) 1.1 % PSTE Place 1 application onto teeth at bedtime. 10/09/20   Annalee Genta, DO    Family History Family History  Problem Relation Age of Onset   COPD Mother    Osteoporosis Mother    Immunodeficiency Mother    COPD Father    Emphysema Father    Tuberculosis Father    Lung disease Father    COPD Sister    Healthy Daughter    Healthy Son    Arthritis Other    Lung disease Other    Cancer Other    Asthma Other    Eczema Neg Hx    Atopy Neg Hx    Angioedema Neg Hx    Allergic rhinitis Neg Hx     Social History Social History   Tobacco Use   Smoking status: Former    Packs/day: 1.50    Years: 27.00    Additional pack years: 0.00    Total pack years: 40.50    Types: Cigarettes    Start date: 1973    Quit date:  2000    Years since quitting: 24.4   Smokeless tobacco: Never  Vaping Use   Vaping Use: Never used  Substance Use Topics   Alcohol use: No   Drug use: No     Allergies   Penicillins   Review of Systems Review of Systems Per HPI  Physical Exam Triage Vital Signs ED Triage Vitals  Enc Vitals Group     BP 04/27/23 1334 (!) 170/78     Pulse Rate 04/27/23 1334 87     Resp 04/27/23 1334 18     Temp 04/27/23 1334 97.6 F (36.4 C)     Temp Source 04/27/23 1334 Oral     SpO2 04/27/23 1334 92 %     Weight --      Height --      Head Circumference --      Peak Flow --      Pain Score 04/27/23 1336 0     Pain Loc --      Pain Edu? --      Excl. in GC? --    No data found.  Updated Vital Signs BP (!) 170/78 (BP Location: Right Arm)   Pulse 87   Temp 97.6 F (36.4 C) (Oral)   Resp 18   SpO2 92%   Visual Acuity Right Eye Distance:   Left Eye Distance:   Bilateral Distance:    Right Eye Near:   Left Eye Near:    Bilateral Near:     Physical Exam Vitals and nursing note reviewed.  Constitutional:      Appearance: Normal appearance. She is not toxic-appearing.  Eyes:     Extraocular Movements: Extraocular movements  intact.     Pupils: Pupils are equal, round, and reactive to light.  Pulmonary:     Effort: Pulmonary effort is normal.  Genitourinary:    Comments: Redness and erythema noted to the bilateral labia majora.  Mild swelling noted. Musculoskeletal:     Cervical back: Normal range of motion.  Skin:    General: Skin is warm and dry.     Comments: Erythema and scaling noted in the bilateral groin region.  Neurological:     General: No focal deficit present.     Mental Status: She is alert and oriented to person, place, and time.  Psychiatric:        Mood and Affect: Mood normal.        Behavior: Behavior normal.      UC Treatments / Results  Labs (all labs ordered are listed, but only abnormal results are displayed) Labs Reviewed  POCT URINALYSIS DIP (MANUAL ENTRY) - Abnormal; Notable for the following components:      Result Value   Leukocytes, UA Trace (*)    All other components within normal limits  URINE CULTURE  CERVICOVAGINAL ANCILLARY ONLY    EKG   Radiology No results found.  Procedures Procedures (including critical care time)  Medications Ordered in UC Medications - No data to display  Initial Impression / Assessment and Plan / UC Course  I have reviewed the triage vital signs and the nursing notes.  Pertinent labs & imaging results that were available during my care of the patient were reviewed by me and considered in my medical decision making (see chart for details).  The patient is well-appearing, she is in no acute distress, vital signs are stable.  Patient is well-appearing, she is in no acute distress, vital signs are stable. Suspect skin yeast and  vulvovaginitis.  Will treat with Diflucan 150 mg tablets, Mycostatin cream, and nystatin powder.  Patient was provided supportive care recommendations to include keeping the areas clean and dry, wearing loosefitting clothing, and wearing cotton underwear.  Patient was advised to follow-up if symptoms do not  improve.  Patient is in agreement with this plan of care and verbalizes understanding.  All questions were answered.  Patient stable for discharge.   Final Clinical Impressions(s) / UC Diagnoses   Final diagnoses:  Vaginitis and vulvovaginitis  Skin yeast infection     Discharge Instructions      Use medication as prescribed. May take over-the-counter Benadryl as needed for itching or discomfort. Continue use of cotton underwear.  Also recommend sun dresses, or clothing that reads. Avoid scratching or rubbing the areas while symptoms persist. Wipe from front to back after using the toilet. Keep the areas clean and dry. Recommend eating yogurt to help with vaginal symptoms. As discussed, if symptoms or not improving over the next 48 hours, please follow-up in this clinic for further evaluation. Follow-up as needed.     ED Prescriptions     Medication Sig Dispense Auth. Provider   fluconazole (DIFLUCAN) 150 MG tablet Take 1 tablet by mouth every 72 hours as needed. 3 tablet Almin Livingstone-Warren, Sadie Haber, NP   nystatin cream (MYCOSTATIN) Apply to affected area 2 times daily 45 g Adalei Novell-Warren, Sadie Haber, NP   nystatin (MYCOSTATIN/NYSTOP) powder Apply 1 Application topically 3 (three) times daily. 30 g Zalma Channing-Warren, Sadie Haber, NP      PDMP not reviewed this encounter.   Abran Cantor, NP 04/27/23 1429

## 2023-04-27 NOTE — ED Triage Notes (Signed)
Vaginal itching and pressure x 1 week.  Has tried over the counter yeast medication with no relief.  Was recently on clindamycin for facial acne.

## 2023-04-28 LAB — CERVICOVAGINAL ANCILLARY ONLY
Bacterial Vaginitis (gardnerella): NEGATIVE
Candida Glabrata: NEGATIVE
Candida Vaginitis: NEGATIVE
Comment: NEGATIVE
Comment: NEGATIVE
Comment: NEGATIVE

## 2023-04-28 LAB — URINE CULTURE: Culture: NO GROWTH

## 2023-04-29 ENCOUNTER — Ambulatory Visit (INDEPENDENT_AMBULATORY_CARE_PROVIDER_SITE_OTHER): Payer: PPO | Admitting: Nurse Practitioner

## 2023-04-29 VITALS — BP 107/71 | Ht 61.5 in | Wt 175.6 lb

## 2023-04-29 DIAGNOSIS — B3731 Acute candidiasis of vulva and vagina: Secondary | ICD-10-CM | POA: Diagnosis not present

## 2023-04-29 DIAGNOSIS — N9089 Other specified noninflammatory disorders of vulva and perineum: Secondary | ICD-10-CM

## 2023-04-29 MED ORDER — TERCONAZOLE 0.4 % VA CREA: 1.0000 | TOPICAL_CREAM | Freq: Every day | VAGINAL | 0 refills | Status: DC

## 2023-04-29 NOTE — Progress Notes (Signed)
Subjective:    Patient ID: [name], female    DOB: 20-Jan-1956, 67 y.o.   MRN: [id number]  HPI  Patient arrives for a follow up from urgent care visit for yeast infection. Patient states she is still having problems with yeast infection. Began 2 weeks ago after working in the yard in the heat.  Started with vaginal itching then spread across the vulvar into the upper thigh area bilaterally..  Used Monistat 3-day therapy with minimal relief.  Itching was extremely intense with lots of scratching in the external vaginal area.  Went to urgent care on 04/27/2023 diagnosed with vulvovaginitis, given oral fluconazole and Mycostatin powder and cream for external use in the thigh area.  Urine culture was done patient has not received results.  Came in today because she saw some blood when she voided couple of days ago, minimal yesterday and none today.  Unsure the source of whether it is urinary or vaginal.  External discomfort with urination.  Has been doing intense scratching at times although this has improved.  No pelvic pain.  No new sexual partners.  PSH: Vaginal hysterectomy.  Review of Systems  Constitutional:  Negative for fever.  Respiratory:  Negative for chest tightness and shortness of breath.   Genitourinary:  Positive for dysuria and vaginal bleeding. Negative for frequency, genital sores, pelvic pain and vaginal discharge.       Objective:   Physical Exam NAD.  Alert, oriented.  Lungs clear.  Heart regular rate rhythm.  External GU no rashes, lesions or discoloration.  Rash on her intertriginous area upper thighs has resolved.  Tiny erythematous linear areas noted near the urinary meatus consistent with scratching.  Areas are healing with no active bleeding.  Vagina no discharge or bleeding noted.  Bimanual exam no tenderness or masses. Today's Vitals   04/29/23 1507  BP: 107/71  Weight: 175 lb 9.6 oz (79.7 kg)  Height: 5' 1.5" (1.562 m)   Body mass index is 32.64  kg/m.  Results for orders placed or performed during the hospital encounter of 04/27/23  Urine Culture   Specimen: Urine, Clean Catch  Result Value Ref Range   Specimen Description      URINE, CLEAN CATCH Performed at [facility], [address] [place], [place] [address]    Special Requests      NONE Performed at [facility], [address], [place], [place] [address]    Culture      NO GROWTH Performed at [facility], [address] [place], [place] [address]    Report Status 04/28/2023 FINAL   POCT urinalysis dipstick  Result Value Ref Range   Color, UA yellow yellow   Clarity, UA clear clear   Glucose, UA negative negative mg/dL   Bilirubin, UA negative negative   Ketones, POC UA negative negative mg/dL   Spec Grav, UA 2.130 8.657 - 1.025   Blood, UA negative negative   pH, UA 7.0 5.0 - 8.0   Protein Ur, POC negative negative mg/dL   Urobilinogen, UA 0.2 0.2 or 1.0 E.U./dL   Nitrite, UA Negative Negative   Leukocytes, UA Trace (A) Negative  Cervicovaginal ancillary only  Result Value Ref Range   Bacterial Vaginitis (gardnerella) Negative    Candida Vaginitis Negative    Candida Glabrata Negative    Comment      Normal Reference Range Bacterial Vaginosis - Negative   Comment Normal Reference Range Candida Species - Negative    Comment  Normal Reference Range Candida Galbrata - Negative    Reviewed urine culture with patient which indicated no infection.        Assessment & Plan:  Vaginal candidiasis  Vulvar bleeding Meds ordered this encounter  Medications   terconazole (TERAZOL 7) 0.4 % vaginal cream    Sig: Place 1 applicator vaginally at bedtime. Prn yeast infection    Dispense:  45 g    Refill:  0    Order Specific Question:   Supervising Provider    Answer:   Lilyan Punt A [[id number]]   Terazol cream once at nighttime for the next few days.  Apply to vulvar area near the urinary meatus.  Avoid any scratching.  This appears to be the source of  any bleeding.  Healing without difficulty.  Expect continued gradual resolution of symptoms, call back if worsens or persist.

## 2023-04-29 NOTE — Patient Instructions (Signed)

## 2023-04-30 ENCOUNTER — Encounter: Payer: Self-pay | Admitting: Nurse Practitioner

## 2023-05-12 ENCOUNTER — Other Ambulatory Visit: Payer: Self-pay | Admitting: Family Medicine

## 2023-05-12 DIAGNOSIS — F32A Depression, unspecified: Secondary | ICD-10-CM

## 2023-05-26 ENCOUNTER — Ambulatory Visit: Payer: PPO | Admitting: Allergy & Immunology

## 2023-05-28 ENCOUNTER — Ambulatory Visit (INDEPENDENT_AMBULATORY_CARE_PROVIDER_SITE_OTHER): Payer: PPO | Admitting: Family Medicine

## 2023-05-28 VITALS — BP 122/74 | HR 91 | Temp 98.2°F | Wt 178.0 lb

## 2023-05-28 DIAGNOSIS — E7849 Other hyperlipidemia: Secondary | ICD-10-CM

## 2023-05-28 DIAGNOSIS — J45909 Unspecified asthma, uncomplicated: Secondary | ICD-10-CM | POA: Diagnosis not present

## 2023-05-28 DIAGNOSIS — I10 Essential (primary) hypertension: Secondary | ICD-10-CM | POA: Diagnosis not present

## 2023-05-28 DIAGNOSIS — M79671 Pain in right foot: Secondary | ICD-10-CM | POA: Diagnosis not present

## 2023-05-28 DIAGNOSIS — E038 Other specified hypothyroidism: Secondary | ICD-10-CM | POA: Diagnosis not present

## 2023-05-28 MED ORDER — PREDNISONE 10 MG PO TABS
ORAL_TABLET | ORAL | 0 refills | Status: DC
Start: 2023-05-28 — End: 2023-06-28

## 2023-05-28 NOTE — Patient Instructions (Signed)
Rest, ice.  Medication as prescribed.  Thyroid labs today.  Follow up in 6 months or sooner if needed.

## 2023-05-29 LAB — TSH+FREE T4
Free T4: 1.34 ng/dL (ref 0.82–1.77)
TSH: 1.79 u[IU]/mL (ref 0.450–4.500)

## 2023-05-30 DIAGNOSIS — M79673 Pain in unspecified foot: Secondary | ICD-10-CM | POA: Insufficient documentation

## 2023-05-30 NOTE — Assessment & Plan Note (Addendum)
Stable on Breztri.

## 2023-05-30 NOTE — Assessment & Plan Note (Signed)
Achilles tendinopathy versus retrocalcaneal bursitis.  Treating with prednisone.  This should also help patient's knee pain and back pain.

## 2023-05-30 NOTE — Assessment & Plan Note (Signed)
Thyroid testing done and was normal.  Continue current dosing of Synthroid.

## 2023-05-30 NOTE — Assessment & Plan Note (Signed)
Stable on Crestor. Continue. 

## 2023-05-30 NOTE — Assessment & Plan Note (Signed)
Well controlled. Continue losartan. 

## 2023-05-30 NOTE — Progress Notes (Signed)
Subjective:  Patient ID: [name], female    DOB: 01-23-1956  Age: 67 y.o. MRN: [id number]  CC: Follow up  HPI:  67 year old female with the below mentioned medical problems presents for follow-up.  Patient reports ongoing joint pain particular the knees and low back.  Patient also reports that she is having pain to the right heel.  Worse with activity.  Asthma is stable on Breztri.  Hypertension stable on losartan.  Last thyroid testing was abnormal.  Needs thyroid labs today.  Compliant with levothyroxine.  Current dosing is 125 mcg.  Hyperlipidemia has been well-controlled on Crestor.  Patient Active Problem List   Diagnosis Date Noted   Heel pain 05/30/2023   Other fatigue 03/23/2023   Insomnia 03/23/2023   Obesity (BMI 30-39.9) 01/15/2023   Primary osteoarthritis involving multiple joints 05/15/2022   Asthma 03/04/2022   CAD (coronary artery disease) 03/04/2022   Anxiety and depression 03/04/2022   Healthcare maintenance 09/18/2021   IBS (irritable bowel syndrome) 07/31/2021   Prediabetes 12/13/2017   Hyperlipidemia 12/04/2016   Gastroesophageal reflux disease 09/19/2012   Essential hypertension 07/25/2012   Hypothyroidism 07/25/2012    Social Hx   Social History   Socioeconomic History   Marital status: Married    Spouse name: Not on file   Number of children: Not on file   Years of education: 12   Highest education level: 12th grade  Occupational History   Not on file  Tobacco Use   Smoking status: Former    Packs/day: 1.50    Years: 27.00    Additional pack years: 0.00    Total pack years: 40.50    Types: Cigarettes    Start date: 1973    Quit date: 2000    Years since quitting: 24.4   Smokeless tobacco: Never  Vaping Use   Vaping Use: Never used  Substance and Sexual Activity   Alcohol use: No   Drug use: No   Sexual activity: Not on file  Other Topics Concern   Not on file  Social History Narrative   Not on file   Social  Determinants of Health   Financial Resource Strain: Low Risk  (04/28/2023)   Overall Financial Resource Strain (CARDIA)    Difficulty of Paying Living Expenses: Not hard at all  Food Insecurity: No Food Insecurity (04/28/2023)   Hunger Vital Sign    Worried About Running Out of Food in the Last Year: Never true    Ran Out of Food in the Last Year: Never true  Transportation Needs: No Transportation Needs (04/28/2023)   PRAPARE - Administrator, Civil Service (Medical): No    Lack of Transportation (Non-Medical): No  Physical Activity: Insufficiently Active (04/28/2023)   Exercise Vital Sign    Days of Exercise per Week: 3 days    Minutes of Exercise per Session: 20 min  Stress: No Stress Concern Present (04/28/2023)   [employer] of Occupational Health - Occupational Stress Questionnaire    Feeling of Stress : Only a little  Social Connections: Moderately Isolated (04/28/2023)   Social Connection and Isolation Panel [NHANES]    Frequency of Communication with Friends and Family: Three times a week    Frequency of Social Gatherings with Friends and Family: Once a week    Attends Religious Services: Never    Database administrator or Organizations: No    Attends Engineer, structural: Not on file    Marital Status: Married  Review of Systems Per HPI  Objective:  BP 122/74   Pulse 91   Temp 98.2 F (36.8 C) (Oral)   Wt 178 lb (80.7 kg)   SpO2 95%   BMI 33.09 kg/m      05/28/2023    2:15 PM 04/29/2023    3:07 PM 04/27/2023    1:34 PM  BP/Weight  Systolic BP 122 107 170  Diastolic BP 74 71 78  Wt. (Lbs) 178 175.6   BMI 33.09 kg/m2 32.64 kg/m2     Physical Exam Vitals and nursing note reviewed.  Constitutional:      General: She is not in acute distress.    Appearance: Normal appearance.  HENT:     Head: Normocephalic and atraumatic.  Eyes:     General:        Right eye: No discharge.        Left eye: No discharge.     Conjunctiva/sclera:  Conjunctivae normal.  Cardiovascular:     Rate and Rhythm: Normal rate and regular rhythm.  Pulmonary:     Effort: Pulmonary effort is normal.     Breath sounds: Normal breath sounds. No wheezing, rhonchi or rales.  Feet:     Comments: Right heel with tenderness just below the attachment site for the Achilles on the calcaneus. Neurological:     Mental Status: She is alert.  Psychiatric:        Mood and Affect: Mood normal.        Behavior: Behavior normal.     Lab Results  Component Value Date   WBC 11.0 (H) 01/14/2023   HGB 13.9 01/14/2023   HCT 42.6 01/14/2023   PLT 237 01/14/2023   GLUCOSE 84 01/14/2023   CHOL 147 01/14/2023   TRIG 76 01/14/2023   HDL 74 01/14/2023   LDLCALC 58 01/14/2023   ALT 27 01/14/2023   AST 21 01/14/2023   NA 141 01/14/2023   K 4.4 01/14/2023   CL 104 01/14/2023   CREATININE 0.84 01/14/2023   BUN 15 01/14/2023   CO2 25 01/14/2023   TSH 1.790 05/28/2023   INR 0.9 10/21/2007   HGBA1C 5.8 (H) 06/16/2021     Assessment & Plan:   Problem List Items Addressed This Visit       Cardiovascular and Mediastinum   Essential hypertension - Primary    Well-controlled.  Continue losartan.        Respiratory   Asthma    Stable on Breztri.      Relevant Medications   predniSONE (DELTASONE) 10 MG tablet     Endocrine   Hypothyroidism    Thyroid testing done and was normal.  Continue current dosing of Synthroid.      Relevant Orders   TSH + free T4 (Completed)     Other   Hyperlipidemia    Stable on Crestor.  Continue.      Heel pain    Achilles tendinopathy versus retrocalcaneal bursitis.  Treating with prednisone.  This should also help patient's knee pain and back pain.       Meds ordered this encounter  Medications   predniSONE (DELTASONE) 10 MG tablet    Sig: 50 mg daily x 2 days, then 40 mg daily x 2 days, then 30 mg daily x 2 days, then 20 mg daily x 2 days, then 10 mg daily x 2 days.    Dispense:  30 tablet    Refill:   0    Follow-up:  Return  in about 6 months (around 11/27/2023).  Everlene Other DO [facility]

## 2023-06-09 ENCOUNTER — Telehealth: Payer: Self-pay

## 2023-06-09 NOTE — Telephone Encounter (Signed)
Thanks, Tam Tam!   Weltha Hartlyn, MD [facility]  

## 2023-06-09 NOTE — Telephone Encounter (Signed)
Called and spoke to patient and advise still issues with getting PAP for Tezspire and they have changed forms so she will need to sing new forms again they willemail to her

## 2023-06-09 NOTE — Telephone Encounter (Signed)
Patient called the [facility] office back wanting to know when she could start tezspire injections. I looked and we do not have her medication her at the [place] office and I let her know I would send a message to Tammy.   Patient stated she called several times and have not received a call back. She said she left a message when we transferred her to Tammy today. I did inform her Tammy was either talking to a patient or a pharmacy at that time,but she will get back to her.

## 2023-06-14 ENCOUNTER — Other Ambulatory Visit: Payer: Self-pay | Admitting: Family Medicine

## 2023-06-15 ENCOUNTER — Ambulatory Visit
Admission: EM | Admit: 2023-06-15 | Discharge: 2023-06-15 | Disposition: A | Discharge status: Home / Self Care | Payer: PPO | Attending: Family Medicine | Admitting: Family Medicine

## 2023-06-15 ENCOUNTER — Other Ambulatory Visit: Payer: Self-pay

## 2023-06-15 ENCOUNTER — Encounter: Payer: Self-pay | Admitting: Emergency Medicine

## 2023-06-15 DIAGNOSIS — J4541 Moderate persistent asthma with (acute) exacerbation: Secondary | ICD-10-CM

## 2023-06-15 DIAGNOSIS — J069 Acute upper respiratory infection, unspecified: Secondary | ICD-10-CM

## 2023-06-15 MED ORDER — PROMETHAZINE-DM 6.25-15 MG/5ML PO SYRP: 5.0000 mL | ORAL_SOLUTION | Freq: Four times a day (QID) | ORAL | 0 refills | Status: DC | PRN

## 2023-06-15 MED ORDER — PREDNISONE 20 MG PO TABS: 40.0000 mg | ORAL_TABLET | Freq: Every day | ORAL | 0 refills | Status: DC

## 2023-06-15 MED ORDER — IPRATROPIUM-ALBUTEROL 0.5-2.5 (3) MG/3ML IN SOLN
3.0000 mL | Freq: Once | RESPIRATORY_TRACT | Status: AC
  Administered 2023-06-15: 3 mL via RESPIRATORY_TRACT

## 2023-06-15 NOTE — ED Provider Notes (Signed)
[facility]    CSN: [id number] Arrival date & time: 06/15/23  1211      History   Chief Complaint Chief Complaint  Patient presents with   Cough    HPI [name] is a 67 y.o. female.   Patient presenting today with 4-day history of cough, wheezing, chest tightness, bilateral ear pressure, congestion, chills, fatigue.  Denies chest pain, abdominal pain, nausea vomiting diarrhea, fevers.  So far trying albuterol inhaler, nasal sprays and antihistamines with no relief.  No known sick contacts recently.  History of asthma on budesonide and albuterol as needed, former smoker.    Past Medical History:  Diagnosis Date   Anxiety    Arthritis    Depression    GERD (gastroesophageal reflux disease)    Hypertension    Hypothyroid    Hypothyroidism    Moderate persistent asthma 01/20/2021   PONV (postoperative nausea and vomiting)    Recurrent upper respiratory infection (URI)    Urticaria     Patient Active Problem List   Diagnosis Date Noted   Heel pain 05/30/2023   Other fatigue 03/23/2023   Insomnia 03/23/2023   Obesity (BMI 30-39.9) 01/15/2023   Primary osteoarthritis involving multiple joints 05/15/2022   Asthma 03/04/2022   CAD (coronary artery disease) 03/04/2022   Anxiety and depression 03/04/2022   Healthcare maintenance 09/18/2021   IBS (irritable bowel syndrome) 07/31/2021   Prediabetes 12/13/2017   Hyperlipidemia 12/04/2016   Gastroesophageal reflux disease 09/19/2012   Essential hypertension 07/25/2012   Hypothyroidism 07/25/2012    Past Surgical History:  Procedure Laterality Date   BACK SURGERY     BIOPSY  06/03/2020   Procedure: BIOPSY;  Surgeon: Malissa Hippo, MD;  [facility];  Service: Endoscopy;;   CARPAL TUNNEL RELEASE  06/16/2012   Procedure: CARPAL TUNNEL RELEASE;  Surgeon: Dominica Severin, MD;  Location: [facility];  Service: Orthopedics;  Laterality: Right;  limited open carpal tunnel release    CERVICAL FUSION  2018   CHOLECYSTECTOMY     COLONOSCOPY     COLONOSCOPY WITH PROPOFOL N/A 06/03/2020   Procedure: COLONOSCOPY WITH PROPOFOL;  Surgeon: Malissa Hippo, MD;  Location: [facility];  Service: Endoscopy;  Laterality: N/A;  115   ESOPHAGOGASTRODUODENOSCOPY (EGD) WITH PROPOFOL N/A 06/03/2020   Procedure: ESOPHAGOGASTRODUODENOSCOPY (EGD) WITH PROPOFOL;  Surgeon: Malissa Hippo, MD;  Location: [facility];  Service: Endoscopy;  Laterality: N/A;   TONSILLECTOMY     TRIGGER FINGER RELEASE  06/16/2012   Procedure: RELEASE TRIGGER FINGER/A-1 PULLEY;  Surgeon: Dominica Severin, MD;  Location: [facility];  Service: Orthopedics;  Laterality: Right;  right middle a-1 release   TUBAL LIGATION     UPPER GASTROINTESTINAL ENDOSCOPY     VAGINAL HYSTERECTOMY      OB History   No obstetric history on file.      Home Medications    Prior to Admission medications   Medication Sig Start Date End Date Taking? Authorizing Provider  predniSONE (DELTASONE) 20 MG tablet Take 2 tablets (40 mg total) by mouth daily with breakfast. 06/15/23  Yes Particia Nearing, PA-C  promethazine-dextromethorphan (PROMETHAZINE-DM) 6.25-15 MG/5ML syrup Take 5 mLs by mouth 4 (four) times daily as needed. 06/15/23  Yes Particia Nearing, PA-C  albuterol (VENTOLIN HFA) 108 (90 Base) MCG/ACT inhaler Inhale 1-2 puffs into the lungs every 6 (six) hours as needed for wheezing or shortness of breath. 03/31/23   Alfonse Spruce, MD  Budeson-Glycopyrrol-Formoterol (BREZTRI AEROSPHERE) 160-9-4.8 MCG/ACT AERO Inhale 2 puffs into the lungs in the morning and at bedtime. 03/31/23   Alfonse Spruce, MD  budesonide (PULMICORT) 0.5 MG/2ML nebulizer solution Mix one vial with DuoNeb solution and administer via nebulizer every 4 hours as needed for respiratory flares. 03/31/23   Alfonse Spruce, MD  fluconazole (DIFLUCAN) 150 MG tablet Take 1 tablet by mouth every 72 hours as needed. 04/27/23    Leath-Warren, Sadie Haber, NP  fluorometholone (FML) 0.1 % ophthalmic suspension SMARTSIG:In Eye(s) 03/24/23   [provider]  fluticasone (FLONASE) 50 MCG/ACT nasal spray Place 2 sprays into both nostrils daily. 03/31/23   Alfonse Spruce, MD  gabapentin (NEURONTIN) 100 MG capsule Take 100 mg by mouth at bedtime as needed (back pain).    [provider]  ipratropium-albuterol (DUONEB) 0.5-2.5 (3) MG/3ML SOLN Mix one vial with budesonide solution and administer via nebulizer every 4 hours as needed for respiratory flares. 03/31/23   Alfonse Spruce, MD  levothyroxine (SYNTHROID) 125 MCG tablet TAKE (1) TABLET BY MOUTH ONCE DAILY BEFORE BREAKFAST. 06/14/23   [name], DO  linaclotide (LINZESS) 72 MCG capsule Take 1 capsule (72 mcg total) by mouth daily before breakfast. Patient taking differently: Take 72 mcg by mouth as needed. 12/25/21   Dolores Frame, MD  losartan (COZAAR) 100 MG tablet TAKE (1) TABLET BY MOUTH ONCE DAILY. 03/23/23   [name], DO  montelukast (SINGULAIR) 10 MG tablet Take 1 tablet (10 mg total) by mouth at bedtime. Patient not taking: Reported on 06/15/2023 03/31/23   Alfonse Spruce, MD  nystatin (MYCOSTATIN/NYSTOP) powder Apply 1 Application topically 3 (three) times daily. 04/27/23   Leath-Warren, Sadie Haber, NP  nystatin cream (MYCOSTATIN) Apply to affected area 2 times daily 04/27/23   Leath-Warren, Sadie Haber, NP  polyethylene glycol powder (GLYCOLAX/MIRALAX) 17 GM/SCOOP powder Take 17 g by mouth daily. 06/03/20   Rehman, Joline Maxcy, MD  predniSONE (DELTASONE) 10 MG tablet 50 mg daily x 2 days, then 40 mg daily x 2 days, then 30 mg daily x 2 days, then 20 mg daily x 2 days, then 10 mg daily x 2 days. Patient not taking: Reported on 06/15/2023 05/28/23   [name], DO  rosuvastatin (CRESTOR) 20 MG tablet Take 1 tablet (20 mg total) by mouth daily. 03/23/23   [name], DO  saline (AYR) GEL Place 1 application into both nostrils  daily as needed (dryness).    [provider]  sertraline (ZOLOFT) 100 MG tablet TAKE 1 AND 1/2 TABLETS BY MOUTH ONCE DAILY. 05/13/23   [name], DO  Sodium Fluoride (PREVIDENT 5000 BOOSTER) 1.1 % PSTE Place 1 application onto teeth at bedtime. 10/09/20   Laroy Apple M, DO  terconazole (TERAZOL 7) 0.4 % vaginal cream Place 1 applicator vaginally at bedtime. Prn yeast infection Patient not taking: Reported on 06/15/2023 04/29/23   Campbell Riches, NP    Family History Family History  Problem Relation Age of Onset   COPD Mother    Osteoporosis Mother    Immunodeficiency Mother    COPD Father    Emphysema Father    Tuberculosis Father    Lung disease Father    COPD Sister    Healthy Daughter    Healthy Son    Arthritis Other    Lung disease Other    Cancer Other    Asthma Other    Eczema Neg Hx    Atopy  Neg Hx    Angioedema Neg Hx    Allergic rhinitis Neg Hx     Social History Social History   Tobacco Use   Smoking status: Former    Packs/day: 1.50    Years: 27.00    Additional pack years: 0.00    Total pack years: 40.50    Types: Cigarettes    Start date: 24    Quit date: 2000    Years since quitting: 24.5   Smokeless tobacco: Never  Vaping Use   Vaping Use: Never used  Substance Use Topics   Alcohol use: No   Drug use: No     Allergies   Penicillins   Review of Systems Review of Systems Per HPI  Physical Exam Triage Vital Signs ED Triage Vitals  Enc Vitals Group     BP 06/15/23 1321 (!) 151/72     Pulse Rate 06/15/23 1321 91     Resp 06/15/23 1321 20     Temp 06/15/23 1321 98.2 F (36.8 C)     Temp Source 06/15/23 1321 Oral     SpO2 06/15/23 1321 91 %     Weight --      Height --      Head Circumference --      Peak Flow --      Pain Score 06/15/23 1317 8     Pain Loc --      Pain Edu? --      Excl. in GC? --    No data found.  Updated Vital Signs BP (!) 151/72 (BP Location: Right Arm)   Pulse 91   Temp 98.2 F (36.8  C) (Oral)   Resp 20   SpO2 94%   Visual Acuity Right Eye Distance:   Left Eye Distance:   Bilateral Distance:    Right Eye Near:   Left Eye Near:    Bilateral Near:     Physical Exam Vitals and nursing note reviewed.  Constitutional:      Appearance: Normal appearance.  HENT:     Head: Atraumatic.     Right Ear: Tympanic membrane and external ear normal.     Left Ear: Tympanic membrane and external ear normal.     Nose: Rhinorrhea present.     Mouth/Throat:     Mouth: Mucous membranes are moist.     Pharynx: Posterior oropharyngeal erythema present.  Eyes:     Extraocular Movements: Extraocular movements intact.     Conjunctiva/sclera: Conjunctivae normal.  Cardiovascular:     Rate and Rhythm: Normal rate and regular rhythm.     Heart sounds: Normal heart sounds.  Pulmonary:     Effort: Pulmonary effort is normal.     Breath sounds: Wheezing present. No rales.  Musculoskeletal:        General: Normal range of motion.     Cervical back: Normal range of motion and neck supple.  Skin:    General: Skin is warm and dry.  Neurological:     Mental Status: She is alert and oriented to person, place, and time.  Psychiatric:        Mood and Affect: Mood normal.        Thought Content: Thought content normal.      UC Treatments / Results  Labs (all labs ordered are listed, but only abnormal results are displayed) Labs Reviewed - No data to display  EKG   Radiology No results found.  Procedures Procedures (including critical care time)  Medications  Ordered in UC Medications  ipratropium-albuterol (DUONEB) 0.5-2.5 (3) MG/3ML nebulizer solution 3 mL (3 mLs Nebulization Given 06/15/23 1340)    Initial Impression / Assessment and Plan / UC Course  I have reviewed the triage vital signs and the nursing notes.  Pertinent labs & imaging results that were available during my care of the patient were reviewed by me and considered in my medical decision making (see  chart for details).     Significant improvement symptomatically and on exam after DuoNeb treatment, oxygen saturation 94% at time of discharge.  Suspect viral upper respiratory infection causing asthma exacerbation.  Treat with prednisone, Phenergan DM, Mucinex, home inhaler regimen.  Return for worsening symptoms.  Final Clinical Impressions(s) / UC Diagnoses   Final diagnoses:  Viral URI with cough  Moderate persistent asthma with acute exacerbation     Discharge Instructions      Use your budesonide Nebulizer solution once to twice daily, albuterol solution every 4 hours as needed.  I have sent over prednisone and a cough syrup and you may keep taking the Mucinex.  Follow-up for worsening symptoms.    ED Prescriptions     Medication Sig Dispense Auth. Provider   predniSONE (DELTASONE) 20 MG tablet Take 2 tablets (40 mg total) by mouth daily with breakfast. 10 tablet Particia Nearing, PA-C   promethazine-dextromethorphan (PROMETHAZINE-DM) 6.25-15 MG/5ML syrup Take 5 mLs by mouth 4 (four) times daily as needed. 100 mL Particia Nearing, [place]      PDMP not reviewed this encounter.   Particia Nearing, [place] 06/15/23 [phone]

## 2023-06-15 NOTE — ED Triage Notes (Addendum)
Pt reports cough, bilateral ear pressure, chills, fatigue x4 days. Has used inhaler, otc nasal sprays and medications with no change in symptoms.

## 2023-06-15 NOTE — Discharge Instructions (Signed)
Use your budesonide Nebulizer solution once to twice daily, albuterol solution every 4 hours as needed.  I have sent over prednisone and a cough syrup and you may keep taking the Mucinex.  Follow-up for worsening symptoms.

## 2023-06-21 ENCOUNTER — Ambulatory Visit: Payer: PPO | Admitting: Internal Medicine

## 2023-06-21 ENCOUNTER — Encounter: Payer: Self-pay | Admitting: Internal Medicine

## 2023-06-21 ENCOUNTER — Other Ambulatory Visit: Payer: Self-pay

## 2023-06-21 VITALS — BP 146/82 | HR 97 | Temp 98.3°F | Resp 22 | Wt 178.1 lb

## 2023-06-21 DIAGNOSIS — J069 Acute upper respiratory infection, unspecified: Secondary | ICD-10-CM

## 2023-06-21 DIAGNOSIS — J302 Other seasonal allergic rhinitis: Secondary | ICD-10-CM

## 2023-06-21 DIAGNOSIS — J3089 Other allergic rhinitis: Secondary | ICD-10-CM | POA: Diagnosis not present

## 2023-06-21 DIAGNOSIS — J4541 Moderate persistent asthma with (acute) exacerbation: Secondary | ICD-10-CM | POA: Diagnosis not present

## 2023-06-21 MED ORDER — FLUTICASONE PROPIONATE 50 MCG/ACT NA SUSP: 2.0000 | Freq: Every day | NASAL | 5 refills | Status: AC

## 2023-06-21 MED ORDER — ALBUTEROL SULFATE HFA 108 (90 BASE) MCG/ACT IN AERS: 1.0000 | INHALATION_SPRAY | Freq: Four times a day (QID) | RESPIRATORY_TRACT | 1 refills | Status: DC | PRN

## 2023-06-21 MED ORDER — BUDESONIDE 0.5 MG/2ML IN SUSP: RESPIRATORY_TRACT | 3 refills | Status: DC

## 2023-06-21 MED ORDER — BREZTRI AEROSPHERE 160-9-4.8 MCG/ACT IN AERO: 2.0000 | INHALATION_SPRAY | Freq: Two times a day (BID) | RESPIRATORY_TRACT | 5 refills | Status: DC

## 2023-06-21 MED ORDER — IPRATROPIUM-ALBUTEROL 0.5-2.5 (3) MG/3ML IN SOLN: 3.0000 mL | Freq: Four times a day (QID) | RESPIRATORY_TRACT | 1 refills | Status: DC | PRN

## 2023-06-21 MED ORDER — PREDNISONE 10 MG PO TABS: ORAL_TABLET | ORAL | 0 refills | Status: AC

## 2023-06-21 MED ORDER — MONTELUKAST SODIUM 10 MG PO TABS: 10.0000 mg | ORAL_TABLET | Freq: Every day | ORAL | 5 refills | Status: AC

## 2023-06-21 NOTE — Progress Notes (Signed)
   FOLLOW UP Date of Service/Encounter:  06/21/23   Subjective:  [name] (DOB: 1956-06-02) is a 67 y.o. female [facility] for follow up for an acute visit.   History obtained from: chart review and patient. Last seen on 03/31/2023 with Dr. Dellis Anes and was uncontrolled at the time due to frequent flare ups requiring prednisone so discussed starting Tezpsire in addition to Breztri/Singulair .  For allergic rhinitis, she is on Flonase and Singulair.   Asthma: Reports getting sick about 8 days ago with sore throat, eat pain and cough. This then progressed to dyspnea and wheezing.  Went and saw urgent care on 7/9 and was given 40mg  prednisone x5 days which helped with the cough but she continues to have significant wheezing and SOB especially at nighttime.  Was COVID negative at home. Taking Mucinex, Breztri 2 puffs BID, Albuterol nebs throughout the day and Budesonide nebs BID  Rhinitis: Has not had much congestion, rhinorrhea but does have some PND and cough.  Taking Flonase and Singulair daily.   Past Medical History: Past Medical History:  Diagnosis Date   Anxiety    Arthritis    Depression    GERD (gastroesophageal reflux disease)    Hypertension    Hypothyroid    Hypothyroidism    Moderate persistent asthma 01/20/2021   PONV (postoperative nausea and vomiting)    Recurrent upper respiratory infection (URI)    Urticaria     Objective:  BP (!) 146/82   Pulse 97   Temp 98.3 F (36.8 C)   Resp (!) 22   Wt 178 lb 2 oz (80.8 kg)   SpO2 92%   BMI 33.11 kg/m  Body mass index is 33.11 kg/m. Physical Exam: GEN: alert, well developed HEENT: clear conjunctiva, MMM HEART: regular rate and rhythm, no murmur LUNGS: +wheezing in lower fields with forced exhalation, + dry coughing, unlabored respiration SKIN: no rashes or lesions  Assessment:   1. Moderate persistent asthma with acute exacerbation   2. Seasonal and  perennial allergic rhinitis   3. Viral URI with cough     Plan/Recommendations:  1. Moderate persistent asthma exacerbation Viral URI with cough - Will reach out to Tammy about the approval process for Tezspire. - Wheezing on exam with dyspnea and cough after a likely viral URI. Start Prednisone 60mg  x1 day, 40mg  x 2 days, 20mg  x2 days and 10mg  x2 days.  - Daily controller medication(s): Breztri 160-9-4.13mcg 2 puffs twice daily with spacer  - Prior to physical activity: albuterol 2 puffs 10-15 minutes before physical activity. - Rescue medications: Albuterol 2 puffs or 1 nebulizer vial every 4-6 hours as needed for shortness of breath or wheezing  - Changes during respiratory infections or worsening symptoms: Add on Budesonide one treatment via nebulizer twice daily for ONE TO TWO WEEKS.  - Asthma control goals:  * Full participation in all desired activities (may need albuterol before activity) * Albuterol use two time or less a week on average (not counting use with activity) * Cough interfering with sleep two time or less a month * Oral steroids no more than once a year * No hospitalizations  2. Seasonal and perennial allergic rhinitis - Continue with Flonase two sprays per nostril daily.  - Continue with Singulair 10mg  daily.     Return in about 2 weeks (around 07/05/2023).  Alesia Morin, MD [facility]

## 2023-06-21 NOTE — Patient Instructions (Addendum)
1. Moderate persistent asthma exacerbation - Will reach out to Tammy about the approval process for Tezspire. - Start Prednisone 60mg  x1 day, 40mg  x 2 days, 20mg  x2 days and 10mg  x2 days.  - Daily controller medication(s): Breztri two puffs twice daily with spacer  - Prior to physical activity: albuterol 2 puffs 10-15 minutes before physical activity. - Rescue medications: Albuterol 2 puffs or Duoneb 1 vial nebulizer every 4-6 hours as needed for shortness of breath or wheezing.  - Changes during respiratory infections or worsening symptoms: Add on Budesonide one treatment via nebulizer twice daily for ONE TO TWO WEEKS.  - Asthma control goals:  * Full participation in all desired activities (may need albuterol before activity) * Albuterol use two time or less a week on average (not counting use with activity) * Cough interfering with sleep two time or less a month * Oral steroids no more than once a year * No hospitalizations  2. Gastroesophageal reflux disease - I would consider taking a daily reflux medication in case you have "silent reflux" that is making your lungs angrier. - You could try something like Pepcid (famotidine) 40mg  daily (this is less strong than the Protonix that you were taking).  3. Seasonal and perennial allergic rhinitis - Continue with Flonase two sprays per nostril daily.  - Continue with Singulair 10mg  daily.   4. Return in about 2 weeks (around 07/05/2023).

## 2023-06-28 ENCOUNTER — Ambulatory Visit (HOSPITAL_COMMUNITY)
Admission: RE | Admit: 2023-06-28 | Discharge: 2023-06-28 | Disposition: A | Discharge status: Home / Self Care | Payer: PPO | Source: Ambulatory Visit | Attending: Family Medicine | Admitting: Family Medicine

## 2023-06-28 ENCOUNTER — Other Ambulatory Visit: Payer: Self-pay | Admitting: Family Medicine

## 2023-06-28 DIAGNOSIS — R059 Cough, unspecified: Secondary | ICD-10-CM

## 2023-06-28 DIAGNOSIS — R0602 Shortness of breath: Secondary | ICD-10-CM | POA: Diagnosis not present

## 2023-06-28 DIAGNOSIS — Z981 Arthrodesis status: Secondary | ICD-10-CM | POA: Diagnosis not present

## 2023-06-29 NOTE — Telephone Encounter (Signed)
Patient called requesting for the name of the injection that she is suppose to be getting on. Patient stated that she has been waiting since May to get the injections. Patient also stated someone told her that she would get an email and has not received anything.

## 2023-07-01 ENCOUNTER — Ambulatory Visit (INDEPENDENT_AMBULATORY_CARE_PROVIDER_SITE_OTHER): Payer: PPO | Admitting: Family Medicine

## 2023-07-01 VITALS — BP 119/74 | HR 85 | Temp 97.9°F | Ht 61.5 in | Wt 178.0 lb

## 2023-07-01 DIAGNOSIS — J45909 Unspecified asthma, uncomplicated: Secondary | ICD-10-CM | POA: Diagnosis not present

## 2023-07-01 MED ORDER — TRELEGY ELLIPTA 100-62.5-25 MCG/ACT IN AEPB: INHALATION_SPRAY | RESPIRATORY_TRACT | 3 refills | Status: DC

## 2023-07-01 NOTE — Progress Notes (Signed)
Subjective:  Patient ID: [name], female    DOB: [birth year]  Age: 67 y.o. MRN: [id number]  CC: Chief Complaint  Patient presents with   Cough    Dry cough 3 weeks, no fever, sob w/ activity  Has been wheezing and using inhaler and neb tx   Nasal Congestion    Started singulair 2 days ago, ears fullness with pain     HPI:  67 year old female with history of asthma presents for evaluation of the above.  Patient reports that she has not been feeling well for the past 3 weeks.  She reports ongoing shortness of breath, cough, and wheezing.  Wheezing is primarily at night.  She also reports ear fullness and congestion.  Has been seen recently by urgent care and allergy.  Has been treated with prednisone and is still having symptoms.  Patient reports that she is currently using albuterol and budesonide nebulizer.  She has been prescribed Breztri.  I am not sure why she is not taking this medication or if it has not been approved.  She is also on Flonase and Singulair.  Patient Active Problem List   Diagnosis Date Noted   Heel pain 05/30/2023   Other fatigue 03/23/2023   Insomnia 03/23/2023   Obesity (BMI 30-39.9) 01/15/2023   Primary osteoarthritis involving multiple joints 05/15/2022   Asthma 03/04/2022   CAD (coronary artery disease) 03/04/2022   Anxiety and depression 03/04/2022   Healthcare maintenance 09/18/2021   IBS (irritable bowel syndrome) 07/31/2021   Prediabetes 12/13/2017   Hyperlipidemia 12/04/2016   Gastroesophageal reflux disease 09/19/2012   Essential hypertension 07/25/2012   Hypothyroidism 07/25/2012    Social Hx   Social History   Socioeconomic History   Marital status: Married    Spouse name: Not on file   Number of children: Not on file   Years of education: 12   Highest education level: 12th grade  Occupational History   Not on file  Tobacco Use   Smoking status: Former    Current packs/day: 0.00    Average packs/day: 1.5 packs/day for  27.0 years (40.5 ttl pk-yrs)    Types: Cigarettes    Start date: 76    Quit date: 2000    Years since quitting: 24.5   Smokeless tobacco: Never  Vaping Use   Vaping status: Never Used  Substance and Sexual Activity   Alcohol use: No   Drug use: No   Sexual activity: Not on file  Other Topics Concern   Not on file  Social History Narrative   Not on file   Social Determinants of Health   Financial Resource Strain: Low Risk  (04/28/2023)   Overall Financial Resource Strain (CARDIA)    Difficulty of Paying Living Expenses: Not hard at all  Food Insecurity: No Food Insecurity (04/28/2023)   Hunger Vital Sign    Worried About Running Out of Food in the Last Year: Never true    Ran Out of Food in the Last Year: Never true  Transportation Needs: No Transportation Needs (04/28/2023)   PRAPARE - Administrator, Civil Service (Medical): No    Lack of Transportation (Non-Medical): No  Physical Activity: Insufficiently Active (04/28/2023)   Exercise Vital Sign    Days of Exercise per Week: 3 days    Minutes of Exercise per Session: 20 min  Stress: No Stress Concern Present (04/28/2023)   [employer] of Occupational Health - Occupational Stress Questionnaire    Feeling of Stress :  Only a little  Social Connections: Moderately Isolated (04/28/2023)   Social Connection and Isolation Panel [NHANES]    Frequency of Communication with Friends and Family: Three times a week    Frequency of Social Gatherings with Friends and Family: Once a week    Attends Religious Services: Never    Diplomatic Services operational officer: No    Attends Engineer, structural: Not on file    Marital Status: Married    Review of Systems Per HPI  Objective:  BP 119/74   Pulse 85   Temp 97.9 F (36.6 C)   Ht 5' 1.5" (1.562 m)   Wt 178 lb (80.7 kg)   SpO2 97%   BMI 33.09 kg/m      07/01/2023   10:37 AM 06/21/2023   11:19 AM 06/15/2023    1:21 PM  BP/Weight  Systolic BP 119  782 151  Diastolic BP 74 82 72  Wt. (Lbs) 178 178.13   BMI 33.09 kg/m2 33.11 kg/m2     Physical Exam Vitals and nursing note reviewed.  Constitutional:      General: She is not in acute distress.    Appearance: Normal appearance.  Eyes:     General:        Right eye: No discharge.        Left eye: No discharge.     Conjunctiva/sclera: Conjunctivae normal.  Cardiovascular:     Rate and Rhythm: Normal rate and regular rhythm.  Pulmonary:     Effort: Pulmonary effort is normal.     Breath sounds: Normal breath sounds. No wheezing or rales.  Neurological:     Mental Status: She is alert.     Lab Results  Component Value Date   WBC 11.0 (H) 01/14/2023   HGB 13.9 01/14/2023   HCT 42.6 01/14/2023   PLT 237 01/14/2023   GLUCOSE 84 01/14/2023   CHOL 147 01/14/2023   TRIG 76 01/14/2023   HDL 74 01/14/2023   LDLCALC 58 01/14/2023   ALT 27 01/14/2023   AST 21 01/14/2023   NA 141 01/14/2023   K 4.4 01/14/2023   CL 104 01/14/2023   CREATININE 0.84 01/14/2023   BUN 15 01/14/2023   CO2 25 01/14/2023   TSH 1.790 05/28/2023   INR 0.9 10/21/2007   HGBA1C 5.8 (H) 06/16/2021     Assessment & Plan:   Problem List Items Addressed This Visit       Respiratory   Asthma - Primary    Uncontrolled/worsening at this time.  This is likely due to the fact that she has recently been sick and is he is not taking her controller medication.  I have sent in Trelegy in case the [name] is not covered.  She needs 1 of these 2 medications given her uncontrolled asthma.  No additional steroids at this time.  Referring to pulmonology.      Relevant Medications   Fluticasone-Umeclidin-Vilant (TRELEGY ELLIPTA) 100-62.5-25 MCG/ACT AEPB   Other Relevant Orders   Ambulatory referral to Pulmonology    Meds ordered this encounter  Medications   Fluticasone-Umeclidin-Vilant (TRELEGY ELLIPTA) 100-62.5-25 MCG/ACT AEPB    Sig: 1 inhalation daily.    Dispense:  60 each    Refill:  3     Follow-up:  Return if symptoms worsen or fail to improve.  Everlene Other DO [facility]

## 2023-07-01 NOTE — Assessment & Plan Note (Signed)
Uncontrolled/worsening at this time.  This is likely due to the fact that she has recently been sick and is he is not taking her controller medication.  I have sent in Trelegy in case the [name] is not covered.  She needs 1 of these 2 medications given her uncontrolled asthma.  No additional steroids at this time.  Referring to pulmonology.

## 2023-07-01 NOTE — Patient Instructions (Addendum)
I am sending in another medication.  For now continue the Budesonide.  Referring to Pulmonology.

## 2023-07-07 ENCOUNTER — Ambulatory Visit: Payer: PPO | Admitting: Family Medicine

## 2023-07-16 ENCOUNTER — Ambulatory Visit: Payer: PPO | Admitting: Family Medicine

## 2023-07-22 ENCOUNTER — Telehealth: Payer: Self-pay | Admitting: Allergy & Immunology

## 2023-07-22 MED ORDER — SPACER/AERO-HOLDING CHAMBERS DEVI: 1.0000 | 1 refills | Status: AC

## 2023-07-22 NOTE — Telephone Encounter (Signed)
Patient notified that spacer was sent to [facility] in [place].

## 2023-07-22 NOTE — Telephone Encounter (Signed)
Spacer sent to [facility]

## 2023-07-26 NOTE — Telephone Encounter (Signed)
Received the update consent forms [employer] is now requiring and will resubmit to them

## 2023-07-28 NOTE — Progress Notes (Unsigned)
[name], female    DOB: 10-20-1956    MRN: [id number]   Brief patient profile:  55  yowf quit smoking 2002  with lots of exp as child and sinusitis/bronchitis and improved some until 2009 then downhill  referred to pulmonary clinic in [place]  07/29/2023 by Everlene Other  DO   Spirometry wnl   03/31/23    History of Present Illness  07/29/2023  Pulmonary/ 1st office eval/ Naveen Clardy / [facility]  Chief Complaint  Patient presents with   Establish Care   Asthma  Dyspnea:  on a flat a/c environment walks at  [facility] Cough: "always cough" = bad uri always goes to chest takes pred and abx to clear/ bad am throat clearing/ dry mouth but denies using H1  Sleep: level bed / 1 pillow  SABA use: may help some / duoneb once a month at most  02: none  Pred helps some for severe flares of cough but causes wt gain  No obvious day to day or daytime pattern/variability or assoc excess/ purulent sputum or mucus plugs or hemoptysis or cp or chest tightness, subjective wheeze or overt sinus or hb symptoms.     Also denies any obvious fluctuation of symptoms with weather or environmental changes or other aggravating or alleviating factors except as outlined above   No unusual exposure hx or h/o childhood pna/ asthma or knowledge of premature birth.  Current Allergies, Complete Past Medical History, Past Surgical History, Family History, and Social History were reviewed in [employer] record.  ROS  The following are not active complaints unless bolded Hoarseness, sore throat/globus, dysphagia, dental problems, itching, sneezing,  nasal congestion or discharge of excess mucus or purulent secretions, ear ache,   fever, chills, sweats, unintended wt loss or wt gain, classically pleuritic or exertional cp,  orthopnea pnd or arm/hand swelling  or leg swelling, presyncope, palpitations, abdominal pain, anorexia, nausea, vomiting, diarrhea  or change in bowel habits or change in  bladder habits, change in stools or change in urine, dysuria, hematuria,  rash, arthralgias, visual complaints, headache, numbness, weakness or ataxia or problems with walking or coordination,  change in mood or  memory.              Outpatient Medications Prior to Visit - - NOTE:   Unable to verify as accurately reflecting what pt takes    Medication Sig Dispense Refill   albuterol (VENTOLIN HFA) 108 (90 Base) MCG/ACT inhaler Inhale 1-2 puffs into the lungs every 6 (six) hours as needed for wheezing or shortness of breath. 18 g 1   Budeson-Glycopyrrol-Formoterol (BREZTRI AEROSPHERE) 160-9-4.8 MCG/ACT AERO Inhale into the lungs.     fluorometholone (FML) 0.1 % ophthalmic suspension SMARTSIG:In Eye(s)     fluticasone (FLONASE) 50 MCG/ACT nasal spray Place 2 sprays into both nostrils daily. 16 g 5   gabapentin (NEURONTIN) 100 MG capsule Take 100 mg by mouth at bedtime as needed (back pain).     levothyroxine (SYNTHROID) 125 MCG tablet TAKE (1) TABLET BY MOUTH ONCE DAILY BEFORE BREAKFAST. 90 tablet 0   linaclotide (LINZESS) 72 MCG capsule Take 1 capsule (72 mcg total) by mouth daily before breakfast. 90 capsule 3   losartan (COZAAR) 100 MG tablet TAKE (1) TABLET BY MOUTH ONCE DAILY. 90 tablet 3   montelukast (SINGULAIR) 10 MG tablet Take 1 tablet (10 mg total) by mouth at bedtime. 30 tablet 5   polyethylene glycol powder (GLYCOLAX/MIRALAX) 17 GM/SCOOP powder Take 17 g  by mouth daily. 507 g 5   rosuvastatin (CRESTOR) 20 MG tablet Take 1 tablet (20 mg total) by mouth daily. 90 tablet 1   saline (AYR) GEL Place 1 application into both nostrils daily as needed (dryness).     sertraline (ZOLOFT) 100 MG tablet TAKE 1 AND 1/2 TABLETS BY MOUTH ONCE DAILY. 135 tablet 0   Sodium Fluoride (PREVIDENT 5000 BOOSTER) 1.1 % PSTE Place 1 application onto teeth at bedtime. 100 g 3   Spacer/Aero-Holding Chambers DEVI 1 Device by Does not apply route as directed. 1 each 1   budesonide (PULMICORT) 0.5 MG/2ML  nebulizer solution With respiratory illness or flare up, use 0.5mg  twice daily for 1-2 weeks. 75 mL 3   Fluticasone-Umeclidin-Vilant (TRELEGY ELLIPTA) 100-62.5-25 MCG/ACT AEPB 1 inhalation daily. 60 each 3   ipratropium-albuterol (DUONEB) 0.5-2.5 (3) MG/3ML SOLN Take 3 mLs by nebulization every 6 (six) hours as needed (wheezing or shortness of breath). 75 mL 1   Facility-Administered Medications Prior to Visit  Medication Dose Route Frequency Provider Last Rate Last Admin   tezepelumab-ekko (TEZSPIRE) 210 MG/1. syringe 210 mg  210 mg Subcutaneous Q28 days Alfonse Spruce, MD   210 mg at 03/31/23 1621    Past Medical History:  Diagnosis Date   Anxiety    Arthritis    Depression    GERD (gastroesophageal reflux disease)    Hypertension    Hypothyroid    Hypothyroidism    Moderate persistent asthma 01/20/2021   PONV (postoperative nausea and vomiting)    Recurrent upper respiratory infection (URI)    Urticaria       Objective:     BP 137/74   Pulse 90   Ht 5' 1.5" (1.562 m)   Wt 179 lb (81.2 kg)   SpO2 93%   BMI 33.27 kg/m   SpO2: 93 % RA  Pleasant but hoarse amb wf freq throat clearing   HEENT : Oropharynx  clear      Nasal turbinates nl    NECK :  without  apparent JVD/ palpable Nodes/TM    LUNGS: no acc muscle use,  Nl contour chest which is clear to A and P bilaterally without cough on insp or exp maneuvers   CV:  RRR  no s3 or murmur or increase in P2, and no edema   ABD:  soft and nontender with nl inspiratory excursion in the supine position. No bruits or organomegaly appreciated   MS:  Nl gait/ ext warm without deformities Or obvious joint restrictions  calf tenderness, cyanosis or clubbing    SKIN: warm and dry without lesions    NEURO:  alert, approp, nl sensorium with  no motor or cerebellar deficits apparent.     I personally reviewed images and agree with radiology impression as follows:  CXR:   Pa and alteral 06/28/23 There are no new  infiltrates or signs of pulmonary edema.     Assessment   Chronic asthma, mild intermittent, uncomplicated  Assessment & Plan: Quit smoking 2002 with "acute bronchitis than improved" then 50 lb wt gain > 2009 started needing inhalers  - Spirometry wnl   03/31/23   - 07/29/2023  After extensive coaching inhaler device,  effectiveness =    50% from baseline nearly 0 > try max rx for gerd and airsupra up to 2 every 4 hours prn   I'm not at all convinced she has chronic asthma and may be mostly VCD but so far inhalers may have backfired on her  due to component of Upper airway cough syndrome (previously labeled PNDS),  is so named because it's frequently impossible to sort out how much is  CR/sinusitis with freq throat clearing (which can be related to primary GERD)   vs  causing  secondary (" extra esophageal")  GERD from wide swings in gastric pressure that occur with throat clearing, often  promoting self use of mint and menthol lozenges that reduce the lower esophageal sphincter tone and exacerbate the problem further in a cyclical fashion.   These are the same pts (now being labeled as having "irritable larynx syndrome" by some cough centers) who not infrequently have a history of having failed to tolerate ace inhibitors,  dry powder inhalers or biphosphonates or report having atypical/extraesophageal reflux symptoms that don't respond to standard doses of PPI  and are easily confused as having aecopd or asthma flares by even experienced allergists/ pulmonologists (myself included).   Rec: Max gerd rx  see avs for instructions unique to this ov   Airsupra up to 2 q4h  prn with duoneb as back up   F/u 4 weeks with all meds in hand using a trust but verify approach to confirm accurate Medication  Reconciliation The principal here is that until we are certain that the  patients are doing what we've asked, it makes no sense to ask them to do more.    DOE (dyspnea on exertion) Overview: Spirometry  wnl   03/31/23    Assessment & Plan: 07/29/2023   Walked on 3  x  3  lap(s) =  approx 450  ft  @ very brisk pace, stopped due to end of study  with lowest 02 sats 92% and no sob    Not able to reproduce doe in office   Advised on approp use of saba: Re SABA :  I spent extra time with pt today reviewing appropriate use of albuterol for prn use on exertion with the following points: 1) saba is for relief of sob that does not improve by walking a slower pace or resting but rather if the pt does not improve after trying this first. 2) If the pt is convinced, as many are, that saba helps recover from activity faster then it's easy to tell if this is the case by re-challenging : ie stop, take the inhaler, then p 5 minutes try the exact same activity (intensity of workload) that just caused the symptoms and see if they are substantially diminished or not after saba 3) if there is an activity that reproducibly causes the symptoms, try the saba 15 min before the activity on alternate days   If in fact the saba really does help, then fine to continue to use it prn but advised may need to look closer at the maintenance regimen (for now nothing) being used to achieve better control of airways disease with exertion.   Each maintenance medication was reviewed in detail including emphasizing most importantly the difference between maintenance and prns and under what circumstances the prns are to be triggered using an action plan format where appropriate.  Total time for H and P, chart review, counseling, reviewing hfa/neb device(s) , directly observing portions of ambulatory 02 saturation study/ and generating customized AVS unique to this office visit / same day charting = 60 min new pt eval                 Other orders -     Airsupra; Inhale 2 puffs into the  lungs every 6 (six) hours as needed. -     Famotidine; One after supper  Dispense: 30 tablet; Refill: 11    Sandrea Hughs, MD Pulmonary and  Critical Care Medicine [facility] Cell [phone]

## 2023-07-29 ENCOUNTER — Encounter: Payer: Self-pay | Admitting: Internal Medicine

## 2023-07-29 ENCOUNTER — Ambulatory Visit: Payer: PPO | Admitting: Internal Medicine

## 2023-07-29 VITALS — BP 137/74 | HR 90 | Ht 61.5 in | Wt 179.0 lb

## 2023-07-29 DIAGNOSIS — R0609 Other forms of dyspnea: Secondary | ICD-10-CM | POA: Diagnosis not present

## 2023-07-29 DIAGNOSIS — J4520 Mild intermittent asthma, uncomplicated: Secondary | ICD-10-CM

## 2023-07-29 MED ORDER — FAMOTIDINE 20 MG PO TABS: ORAL_TABLET | ORAL | 11 refills | Status: DC

## 2023-07-29 MED ORDER — AIRSUPRA 90-80 MCG/ACT IN AERO: 2.0000 | INHALATION_SPRAY | Freq: Four times a day (QID) | RESPIRATORY_TRACT | Status: DC | PRN

## 2023-07-29 NOTE — Assessment & Plan Note (Signed)
07/29/2023   Walked on 3  x  3  lap(s) =  approx 450  ft  @ very brisk pace, stopped due to end of study  with lowest 02 sats 92% and no sob    Not able to reproduce doe in office   Advised on approp use of saba: Re SABA :  I spent extra time with pt today reviewing appropriate use of albuterol for prn use on exertion with the following points: 1) saba is for relief of sob that does not improve by walking a slower pace or resting but rather if the pt does not improve after trying this first. 2) If the pt is convinced, as many are, that saba helps recover from activity faster then it's easy to tell if this is the case by re-challenging : ie stop, take the inhaler, then p 5 minutes try the exact same activity (intensity of workload) that just caused the symptoms and see if they are substantially diminished or not after saba 3) if there is an activity that reproducibly causes the symptoms, try the saba 15 min before the activity on alternate days   If in fact the saba really does help, then fine to continue to use it prn but advised may need to look closer at the maintenance regimen (for now nothing) being used to achieve better control of airways disease with exertion.   Each maintenance medication was reviewed in detail including emphasizing most importantly the difference between maintenance and prns and under what circumstances the prns are to be triggered using an action plan format where appropriate.  Total time for H and P, chart review, counseling, reviewing hfa/neb device(s) , directly observing portions of ambulatory 02 saturation study/ and generating customized AVS unique to this office visit / same day charting = 60 min new pt eval

## 2023-07-29 NOTE — Patient Instructions (Addendum)
Only use your Airsupra  as a rescue medication to be used if you can't catch your breath by resting or doing a relaxed purse lip breathing pattern.  - The less you use it, the better it will work when you need it. - Ok to use up to 2 puffs  every 4 hours   - Don't leave home without it !!  (think of it like starter fluid for a car engine)   If can't get comfortable use nebulized albuterol up to every 4 hours if needed    Also  Ok to try albuterol x 2 pffs x 15 min before an activity (on alternating days with nebulizer and nothing)  that you know would usually make you short of breath and see if it makes any difference and if makes none then don't take albuterol after activity unless you can't catch your breath as this means it's the resting that helps, not the albuterol.  Work on inhaler technique:  relax and gently blow all the way out then take a nice smooth full deep breath back in, triggering the inhaler at same time you start breathing in.  Hold breath in for at least  5 seconds if you can. Blow out [place]  thru nose. Rinse and gargle with water when done.  If mouth or throat bother you at all,  try brushing teeth/gums/tongue with arm and hammer toothpaste/ make a slurry and gargle and spit out.    Pantoprazole (protonix) 40 mg  Take  30-60 min before first meal of the day and Pepcid (famotidine)  20 mg after supper until return to office   GERD (REFLUX)  is an extremely common cause of respiratory symptoms just like yours , many times with no obvious heartburn at all.    It can be treated with medication, but also with lifestyle changes including elevation of the head of your bed (ideally with 6 -8inch blocks under the headboard of your bed),  Smoking cessation, avoidance of late meals, excessive alcohol, and avoid fatty foods, chocolate, peppermint, colas, red wine, and acidic juices such as orange juice.  NO MINT OR MENTHOL PRODUCTS SO NO COUGH DROPS - LUDENs USE SUGARLESS CANDY INSTEAD  (Jolley ranchers or Stover's or Environmental manager) or even ice chips will also do - the key is to swallow to prevent all throat clearing. NO OIL BASED VITAMINS - use powdered substitutes.  Avoid fish oil when coughing.    Please schedule a follow up office visit in 4 weeks, call sooner if needed with all medications /inhalers/ solutions in hand so we can verify exactly what you are taking. This includes all medications from all doctors and over the counters - PLEASE separate them into two bags:  the ones you take automatically, no matter what, vs the ones you take just when you feel you need them "BAG #2 is UP TO YOU"  - this will really help [name] help you take your medications more effectively.

## 2023-07-29 NOTE — Assessment & Plan Note (Addendum)
Quit smoking 2002 with "acute bronchitis than improved" then 50 lb wt gain > 2009 started needing inhalers  - Spirometry wnl   03/31/23   - 07/29/2023  After extensive coaching inhaler device,  effectiveness =    50% from baseline nearly 0 > try max rx for gerd and airsupra up to 2 every 4 hours prn   I'm not at all convinced she has chronic asthma and may be mostly VCD but so far inhalers may have backfired on her due to component of Upper airway cough syndrome (previously labeled PNDS),  is so named because it's frequently impossible to sort out how much is  CR/sinusitis with freq throat clearing (which can be related to primary GERD)   vs  causing  secondary (" extra esophageal")  GERD from wide swings in gastric pressure that occur with throat clearing, often  promoting self use of mint and menthol lozenges that reduce the lower esophageal sphincter tone and exacerbate the problem further in a cyclical fashion.   These are the same pts (now being labeled as having "irritable larynx syndrome" by some cough centers) who not infrequently have a history of having failed to tolerate ace inhibitors,  dry powder inhalers or biphosphonates or report having atypical/extraesophageal reflux symptoms that don't respond to standard doses of PPI  and are easily confused as having aecopd or asthma flares by even experienced allergists/ pulmonologists (myself included).   Rec: Max gerd rx  see avs for instructions unique to this ov   Airsupra up to 2 q4h  prn with duoneb as back up   F/u 4 weeks with all meds in hand using a trust but verify approach to confirm accurate Medication  Reconciliation The principal here is that until we are certain that the  patients are doing what we've asked, it makes no sense to ask them to do more.

## 2023-08-13 ENCOUNTER — Other Ambulatory Visit (HOSPITAL_COMMUNITY): Payer: Self-pay | Admitting: Family Medicine

## 2023-08-13 DIAGNOSIS — Z1231 Encounter for screening mammogram for malignant neoplasm of breast: Secondary | ICD-10-CM

## 2023-08-23 ENCOUNTER — Encounter (HOSPITAL_COMMUNITY): Payer: Self-pay

## 2023-08-23 ENCOUNTER — Ambulatory Visit (HOSPITAL_COMMUNITY)
Admission: RE | Admit: 2023-08-23 | Discharge: 2023-08-23 | Disposition: A | Discharge status: Home / Self Care | Payer: PPO | Source: Ambulatory Visit | Attending: Family Medicine | Admitting: Family Medicine

## 2023-08-23 DIAGNOSIS — Z1231 Encounter for screening mammogram for malignant neoplasm of breast: Secondary | ICD-10-CM | POA: Insufficient documentation

## 2023-08-25 NOTE — Progress Notes (Signed)
[name], female    DOB: 1955/12/23    MRN: [id number]   Brief patient profile:  24  yowf quit smoking 2002  with lots of exp as child and sinusitis/bronchitis and improved some until 2009 then downhill  referred to pulmonary clinic in [place]  07/29/2023 by Everlene Other  DO   Spirometry wnl   03/31/23    Has seen [facility]   History of Present Illness  07/29/2023  Pulmonary/ 1st office eval/ Jancarlo Biermann / [place] [facility]  Chief Complaint  Patient presents with   Establish Care   Asthma  Dyspnea:  on a flat a/c environment walks at  [employer] Cough: "always cough" = bad uri always goes to chest takes pred and abx to clear/ bad am throat clearing/ dry mouth but denies using H1  Sleep: level bed / 1 pillow  SABA use: may help some / duoneb once a month at most  02: none  Pred helps some for severe flares of cough but causes wt gain Rec Only use your Airsupra  as a rescue medication   If can't get comfortable use nebulized albuterol up to every 4 hours if needed  Also  Ok to try albuterol x 2 pffs x 15 min before an activity (on alternating days with nebulizer and nothing)  that you know would usually make you short of breath  Work on inhaler technique:  Pantoprazole (protonix) 40 mg  Take  30-60 min before first meal of the day and Pepcid (famotidine)  20 mg after supper until return to office  GERD diet reviewed, bed blocks rec   Please schedule a follow up office visit in 4 weeks, call sooner if needed with all medications /inhalers/ solutions in hand      08/26/2023  f/u ov/[place] [facility]/Dennies Coate re: cough /doe  brought meds / completely disorganized maint vs prn  eg singulair prn  Chief Complaint  Patient presents with   Follow-up  Dyspnea:  does fine at [employer] / does not like walkng  heat  Cough: varies along but when starts up is dry and  very harsh  Sleeping: flat  bed s  resp cc / on benadryl qh x years  SABA use: rarely uses  02: none    No  obvious  patterns (other than hot weather intol) in day to day or daytime variability of cough/sob or assoc excess/ purulent sputum or mucus plugs or hemoptysis or cp or chest tightness, subjective wheeze or overt sinus or hb symptoms.   Also denies any obvious fluctuation of symptoms with weather or environmental changes or other aggravating or alleviating factors except as outlined above   No unusual exposure hx or h/o childhood pna/ asthma or knowledge of premature birth.  Current Allergies, Complete Past Medical History, Past Surgical History, Family History, and Social History were reviewed in [employer] record.  ROS  The following are not active complaints unless bolded Hoarseness, sore throat, dysphagia, dental problems, itching, sneezing,  nasal congestion or discharge of excess mucus or purulent secretions, ear ache,   fever, chills, sweats, unintended wt loss or wt gain, classically pleuritic or exertional cp,  orthopnea pnd or arm/hand swelling  or leg swelling, presyncope, palpitations, abdominal pain, anorexia, nausea, vomiting, diarrhea  or change in bowel habits or change in bladder habits, change in stools or change in urine, dysuria, hematuria,  rash, arthralgias, visual complaints, headache, numbness, weakness or ataxia or problems with walking or coordination,  change  in mood or  memory.        Current Meds - - NOTE:   Unable to verify as accurately reflecting what pt takes    Medication Sig   albuterol (VENTOLIN HFA) 108 (90 Base) MCG/ACT inhaler Inhale 1-2 puffs into the lungs every 6 (six) hours as needed for wheezing or shortness of breath.   Albuterol-Budesonide (AIRSUPRA) 90-80 MCG/ACT AERO Inhale 2 puffs into the lungs every 6 (six) hours as needed.   famotidine (PEPCID) 20 MG tablet One after supper   fluorometholone (FML) 0.1 % ophthalmic suspension SMARTSIG:In Eye(s)   fluticasone (FLONASE) 50 MCG/ACT nasal spray Place 2 sprays into both nostrils  daily.   gabapentin (NEURONTIN) 100 MG capsule Take 100 mg by mouth at bedtime as needed (back pain).   levothyroxine (SYNTHROID) 125 MCG tablet TAKE (1) TABLET BY MOUTH ONCE DAILY BEFORE BREAKFAST.   linaclotide (LINZESS) 72 MCG capsule Take 1 capsule (72 mcg total) by mouth daily before breakfast.   losartan (COZAAR) 100 MG tablet TAKE (1) TABLET BY MOUTH ONCE DAILY.   montelukast (SINGULAIR) 10 MG tablet Take 1 tablet (10 mg total) by mouth at bedtime.   polyethylene glycol powder (GLYCOLAX/MIRALAX) 17 GM/SCOOP powder Take 17 g by mouth daily.   rosuvastatin (CRESTOR) 20 MG tablet Take 1 tablet (20 mg total) by mouth daily.   saline (AYR) GEL Place 1 application into both nostrils daily as needed (dryness).   sertraline (ZOLOFT) 100 MG tablet TAKE 1 AND 1/2 TABLETS BY MOUTH ONCE DAILY.   Sodium Fluoride (PREVIDENT 5000 BOOSTER) 1.1 % PSTE Place 1 application onto teeth at bedtime.   Spacer/Aero-Holding Chambers DEVI 1 Device by Does not apply route as directed.   Current Facility-Administered Medications for the 08/26/23 encounter (Office Visit) with Nyoka Cowden, MD  Medication   tezepelumab-ekko (TEZSPIRE) 210 MG/1. syringe 210 mg                Past Medical History:  Diagnosis Date   Anxiety    Arthritis    Depression    GERD (gastroesophageal reflux disease)    Hypertension    Hypothyroid    Hypothyroidism    Moderate persistent asthma 01/20/2021   PONV (postoperative nausea and vomiting)    Recurrent upper respiratory infection (URI)    Urticaria       Objective:     Wt Readings from Last 3 Encounters:  08/26/23 177 lb 9.6 oz (80.6 kg)  07/29/23 179 lb (81.2 kg)  07/01/23 178 lb (80.7 kg)      Vital signs reviewed  08/26/2023  - Note at rest 02 sats  93% on RA   General appearance:    mod obese wf/ cough with insp  maneuvers early on insp eg practicing with empyt hfa container    HEENT : Oropharynx  clear      Nasal turbinates nl    NECK :  without   apparent JVD/ palpable Nodes/TM    LUNGS: no acc muscle use,  Nl contour chest which is clear to A and P bilaterally without cough on insp or exp maneuvers   CV:  RRR  no s3 or murmur or increase in P2, and no edema   ABD:  soft and nontender with nl inspiratory excursion in the supine position. No bruits or organomegaly appreciated   MS:  Nl gait/ ext warm without deformities Or obvious joint restrictions  calf tenderness, cyanosis or clubbing    SKIN: warm and dry without lesions  NEURO:  alert, approp, nl sensorium with  no motor or cerebellar deficits apparent.       Assessment

## 2023-08-26 ENCOUNTER — Telehealth: Payer: Self-pay | Admitting: Family Medicine

## 2023-08-26 ENCOUNTER — Other Ambulatory Visit: Payer: Self-pay

## 2023-08-26 ENCOUNTER — Encounter: Payer: Self-pay | Admitting: Internal Medicine

## 2023-08-26 ENCOUNTER — Ambulatory Visit: Payer: PPO | Admitting: Internal Medicine

## 2023-08-26 VITALS — BP 121/80 | HR 89 | Ht 61.5 in | Wt 177.6 lb

## 2023-08-26 DIAGNOSIS — J4520 Mild intermittent asthma, uncomplicated: Secondary | ICD-10-CM

## 2023-08-26 MED ORDER — BUDESONIDE-FORMOTEROL FUMARATE 80-4.5 MCG/ACT IN AERO: INHALATION_SPRAY | RESPIRATORY_TRACT | 12 refills | Status: AC

## 2023-08-26 MED ORDER — LEVOTHYROXINE SODIUM 125 MCG PO TABS: ORAL_TABLET | ORAL | 0 refills | Status: DC

## 2023-08-26 NOTE — Telephone Encounter (Signed)
Refill on  levothyroxine (SYNTHROID) 125 MCG tablet  send to [facility]

## 2023-08-26 NOTE — Patient Instructions (Addendum)
Work on inhaler technique:  relax and gently blow all the way out then take a nice smooth full deep breath back in, triggering the inhaler at same time you start breathing in.  Hold breath in for at least  5 seconds if you can. Blow out inhalers  thru nose. Rinse and gargle with water when done.  If mouth or throat bother you at all,  try brushing teeth/gums/tongue with arm and hammer toothpaste/ make a slurry and gargle and spit out.   >>>  Remember how golfers warm up by taking practice swings - do this with an empty inhaler    Also  Ok to try Symbicort (breztri or air supra) x 2 puffs  15 min before an activity (on alternating days)  that you know would usually make you short of breath and see if it makes any difference and if makes none then don't take albuterol after activity unless you can't catch your breath as this means it's the resting that helps, not the albuterol.     Start gabapentin 300mg  at least an hour before bed time and 100 mg in am   Pantoprazole (protonix) 40 mg   Take  30-60 min before first meal of the day and Pepcid (famotidine)  20 mg after supper until return to office - this is the best way to tell whether stomach acid is contributing to your problem.    GERD (REFLUX)  is an extremely common cause of respiratory symptoms just like yours , many times with no obvious heartburn at all.    It can be treated with medication, but also with lifestyle changes including elevation of the head of your bed (ideally with 6 -8inch blocks under the headboard of your bed),  Smoking cessation, avoidance of late meals, excessive alcohol, and avoid fatty foods, chocolate, peppermint, colas, red wine, and acidic juices such as orange juice.  NO MINT OR MENTHOL PRODUCTS SO NO COUGH DROPS  USE SUGARLESS CANDY INSTEAD (Jolley ranchers or Stover's or Life Savers) or even ice chips will also do - the key is to swallow to prevent all throat clearing.  Take tylenol instead of tylenol pm with  benadryl       Please schedule a follow up office visit in 4 weeks, sooner if needed- bring inhalers

## 2023-08-27 ENCOUNTER — Telehealth: Payer: Self-pay | Admitting: Internal Medicine

## 2023-08-27 ENCOUNTER — Telehealth: Payer: Self-pay

## 2023-08-27 NOTE — Telephone Encounter (Signed)
Called and informed pt  , per Dr Sherene Sires note this is to help with cough and open up her air way , pt wanted to confirm this, I advised this was only  short term. Pt stated that she was just a little scared of taking this medication, because it make her sleepy , she stated that she is going to try it . I told pt call us back if she had any concerns.

## 2023-08-27 NOTE — Assessment & Plan Note (Signed)
Quit smoking 2002 with "acute bronchitis that improved" then 50 lb wt gain  > 2009 started needing inhalers  - Spirometry wnl   03/31/23   - 07/29/2023   try max rx for gerd and airsupra up to 2 every 4 hours prn  - Tezpire rx  07/29/23 >>> - 08/26/2023  After extensive coaching inhaler device,  effectiveness =    60%   Not at all clear to me what is asthma vs Upper airway cough syndrome (previously labeled PNDS),  is so named because it's frequently impossible to sort out how much is  CR/sinusitis with freq throat clearing (which can be related to primary GERD)   vs  causing  secondary (" extra esophageal")  GERD from wide swings in gastric pressure that occur with throat clearing, often  promoting self use of mint and menthol lozenges that reduce the lower esophageal sphincter tone and exacerbate the problem further in a cyclical fashion.   These are the same pts (now being labeled as having "irritable larynx syndrome" by some cough centers) who not infrequently have a history of having failed to tolerate ace inhibitors,  dry powder inhalers or biphosphonates or report having atypical/extraesophageal reflux symptoms that don't respond to standard doses of PPI  and are easily confused as having aecopd or asthma flares by even experienced allergists/ pulmonologists (myself included).   Rec Try change to symbicort 80 to be used up to 2 bid but especially try to time 15 min prior to activity (wouild do this with airspra but she can't afford it)  Add gabapentin 300 mg hs and 100 mg q am for uacs/ ? Vcd component  Work on sub max ex daily   Continue max gerd rx / diet   F/u  4 weeks with all resp meds in hand using a trust but verify approach to confirm accurate Medication  Reconciliation The principal here is that until we are certain that the  patients are doing what we've asked, it makes no sense to ask them to do more.          Each maintenance medication was reviewed in detail including  emphasizing most importantly the difference between maintenance and prns and under what circumstances the prns are to be triggered using an action plan format where appropriate -  she was completely disorganized at baseline on how and when to take to take what meds so needs a trust but verify approach here.   Total time for H and P, chart review, counseling, reviewing hfa/spacer device(s) and generating customized AVS unique to this office visit / same day charting   > 40 min for   refractory respiratory  symptoms of uncertain etiology /polypharmacy.

## 2023-08-27 NOTE — Telephone Encounter (Signed)
Patient was in yesterday and saw Dr. Sherene Sires. She is taking Gabapentin 300 mg at night and  100 mg in the am.  She is not sure why she is taking this medication.  Please call [phone]

## 2023-08-27 NOTE — Telephone Encounter (Signed)
[id number] Delcastillo [birth year]  Symbicort 80-4.34mcg  [facility]  Cover My meds

## 2023-09-02 ENCOUNTER — Other Ambulatory Visit: Payer: Self-pay

## 2023-09-02 ENCOUNTER — Other Ambulatory Visit (HOSPITAL_COMMUNITY): Payer: Self-pay

## 2023-09-02 ENCOUNTER — Other Ambulatory Visit: Payer: Self-pay | Admitting: Family Medicine

## 2023-09-02 MED ORDER — FLUTICASONE-SALMETEROL 45-21 MCG/ACT IN AERO: 2.0000 | INHALATION_SPRAY | Freq: Two times a day (BID) | RESPIRATORY_TRACT | 12 refills | Status: AC

## 2023-09-02 NOTE — Telephone Encounter (Signed)
Did you want to switch patient to a different  inhaler.

## 2023-09-02 NOTE — Telephone Encounter (Signed)
Called and informed pt that her inhaler has been changed ,pt is call back if she can't afford inhaler

## 2023-09-02 NOTE — Telephone Encounter (Signed)
Advair 45-21  Take 2 puffs first thing in am and then another 2 puffs about 12 hours later.

## 2023-09-02 NOTE — Telephone Encounter (Signed)
No notation of patient having tried any other therapies.  Preferred alternatives are as follows:  Generic Advair Diskus/Wixela-$5.00 Advair HFA-$47.00 Breo Ellipta-$47.00

## 2023-09-03 ENCOUNTER — Other Ambulatory Visit: Payer: Self-pay | Admitting: Family Medicine

## 2023-09-03 ENCOUNTER — Other Ambulatory Visit (HOSPITAL_COMMUNITY): Payer: Self-pay

## 2023-09-09 ENCOUNTER — Ambulatory Visit (INDEPENDENT_AMBULATORY_CARE_PROVIDER_SITE_OTHER): Payer: PPO | Admitting: Nurse Practitioner

## 2023-09-09 VITALS — BP 139/92 | HR 72 | Temp 97.7°F | Ht 61.5 in | Wt 175.4 lb

## 2023-09-09 DIAGNOSIS — L719 Rosacea, unspecified: Secondary | ICD-10-CM

## 2023-09-09 DIAGNOSIS — Z23 Encounter for immunization: Secondary | ICD-10-CM

## 2023-09-09 MED ORDER — DOXYCYCLINE HYCLATE 100 MG PO CAPS: 100.0000 mg | ORAL_CAPSULE | Freq: Two times a day (BID) | ORAL | 0 refills | Status: DC

## 2023-09-09 NOTE — Progress Notes (Signed)
Acute Office Visit  Subjective:     Patient ID: [name], female    DOB: June 10, 1956, 67 y.o.   MRN: [id number]   HPI Patient is in today for redness, itching, and pain on both sides of her nose x 2 weeks. Pt also reports itching and watery eyes with clear drainage, itching bilateral ears, internally and externally. Pt states she has a scratchy throat, non-productive cough, rhinorrhea with clear drainage. Reports feeling tired over the last two weeks. Denies fever or chills. She has taken Tylenol PM and Singulair with minimal relief. Has been applying Vaseline in nasal folds with no improvement. Was diagnosed with rosacea in the past. Has been using Metrogel BID to the face for a long time.   Past Medical History:  Diagnosis Date   Anxiety    Arthritis    Depression    GERD (gastroesophageal reflux disease)    Hypertension    Hypothyroid    Hypothyroidism    Moderate persistent asthma 01/20/2021   PONV (postoperative nausea and vomiting)    Recurrent upper respiratory infection (URI)    Urticaria      Review of Systems  Constitutional:  Positive for malaise/fatigue. Negative for chills and fever.  HENT:  Positive for sore throat. Negative for congestion, ear discharge, ear pain, sinus pain and tinnitus.   Eyes:  Positive for discharge. Negative for blurred vision, photophobia, pain and redness.       Has seen optometrist for eye exams. Diagnosed with dry eyes. Cannot afford prescription eye drops. Using OTC Systane which helps.   Respiratory:  Positive for cough. Negative for shortness of breath and wheezing.        Regular follow up with pulmonology.   Cardiovascular:  Negative for chest pain.  Gastrointestinal:  Negative for constipation, diarrhea, nausea and vomiting.  Skin:  Positive for itching and rash.       Around bilateral nares, in nasal folds, bilateral ears  Neurological:  Negative for headaches.        Objective:    BP (!) 139/92   Pulse 72   Temp  97.7 F (36.5 C)   Ht 5' 1.5" (1.562 m)   Wt 79.6 kg   SpO2 95%   BMI 32.60 kg/m    Physical Exam Constitutional:      General: She is not in acute distress.    Appearance: Normal appearance.  HENT:     Right Ear: Tympanic membrane is retracted.     Left Ear: Tympanic membrane is retracted.     Nose:     Comments: Erythematous nasal folds with excoriation    Mouth/Throat:     Pharynx: Postnasal drip present.  Eyes:     General:        Right eye: No discharge.        Left eye: No discharge.     Comments: Clear, watery discharge bilaterally. Conjunctivae mildly injected bilaterally.   Cardiovascular:     Rate and Rhythm: Normal rate and regular rhythm.     Heart sounds: Normal heart sounds. No murmur heard. Pulmonary:     Effort: Pulmonary effort is normal.     Breath sounds: Normal breath sounds. No wheezing.  Lymphadenopathy:     Cervical: No cervical adenopathy.  Skin:    General: Skin is warm and dry.     Findings: Rash present.     Comments: Dry papular rash mainly around the nose with scattered lesions on the face and ears.  Neurological:     Mental Status: She is alert.    Today's Vitals   09/09/23 1120  BP: (!) 139/92  Pulse: 72  Temp: 97.7 F (36.5 C)  SpO2: 95%  Weight: 175 lb 6.4 oz (79.6 kg)  Height: 5' 1.5" (1.562 m)   Body mass index is 32.6 kg/m.         Assessment & Plan:   1. Immunization due - Flu Vaccine Trivalent High Dose (Fluad)  2. Acne rosacea -Doxycycline 100 mg PO BID x 7 days  -Continue using prescribed Metrogel -Continue using Systane eye drops and Singulair for symptom relief. Call back if worsens or persists.      Meds ordered this encounter  Medications   doxycycline (VIBRAMYCIN) 100 MG capsule    Sig: Take 1 capsule (100 mg total) by mouth 2 (two) times daily.    Dispense:  14 capsule    Refill:  0    Order Specific Question:   Supervising Provider    Answer:   Lilyan Punt A [[id number]]    Return if  symptoms worsen or fail to improve.

## 2023-09-10 ENCOUNTER — Encounter: Payer: Self-pay | Admitting: Nurse Practitioner

## 2023-09-10 NOTE — Progress Notes (Signed)
Subjective:    Patient ID: [name], female    DOB: 02/22/1956, 67 y.o.   MRN: [id number]  HPI    Review of Systems     Objective:   Physical Exam        Assessment & Plan:

## 2023-09-22 ENCOUNTER — Telehealth: Payer: Self-pay | Admitting: Family Medicine

## 2023-09-22 ENCOUNTER — Telehealth: Payer: Self-pay

## 2023-09-22 ENCOUNTER — Other Ambulatory Visit: Payer: Self-pay | Admitting: Nurse Practitioner

## 2023-09-22 ENCOUNTER — Other Ambulatory Visit: Payer: Self-pay | Admitting: Family Medicine

## 2023-09-22 MED ORDER — VALACYCLOVIR HCL 1 G PO TABS: ORAL_TABLET | ORAL | 6 refills | Status: DC

## 2023-09-22 MED ORDER — VALACYCLOVIR HCL 1 G PO TABS: ORAL_TABLET | ORAL | 2 refills | Status: DC

## 2023-09-22 NOTE — Progress Notes (Deleted)
[name], female    DOB: [birth year]    MRN: [id number]   Brief patient profile:  79  yowf quit smoking 2002  with lots of exp as child and sinusitis/bronchitis and improved some until 2009 then downhill  referred to pulmonary clinic in [place]  07/29/2023 by Everlene Other  DO   Spirometry wnl   03/31/23    Has seen [facility]   History of Present Illness  07/29/2023  Pulmonary/ 1st office eval/ [name] / [place] [facility]  Chief Complaint  Patient presents with   Establish Care   Asthma  Dyspnea:  on a flat a/c environment walks at  [employer] Cough: "always cough" = bad uri always goes to chest takes pred and abx to clear/ bad am throat clearing/ dry mouth but denies using H1  Sleep: level bed / 1 pillow  SABA use: may help some / duoneb once a month at most  02: none  Pred helps some for severe flares of cough but causes wt gain Rec Only use your Airsupra  as a rescue medication   If can't get comfortable use nebulized albuterol up to every 4 hours if needed  Also  Ok to try albuterol x 2 pffs x 15 min before an activity (on alternating days with nebulizer and nothing)  that you know would usually make you short of breath  Work on inhaler technique:  Pantoprazole (protonix) 40 mg  Take  30-60 min before first meal of the day and Pepcid (famotidine)  20 mg after supper until return to office  GERD diet reviewed, bed blocks rec   Please schedule a follow up office visit in 4 weeks, call sooner if needed with all medications /inhalers/ solutions in hand      08/26/2023  f/u ov/[place] office/[name] re: cough /doe  brought meds / completely disorganized maint vs prn  eg singulair prn  Chief Complaint  Patient presents with   Follow-up  Dyspnea:  does fine at [employer] / does not like walkng  heat  Cough: varies along but when starts up is dry and  very harsh  Sleeping: flat  bed s  resp cc / on benadryl qh x years  SABA use: rarely uses  02: none Rec Work on  inhaler technique:  >>>  Remember how golfers warm up by taking practice swings - do this with an empty inhaler  Also  Ok to try Symbicort (breztri or air supra) x 2 puffs  15 min before an activity  Start gabapentin 300mg  at least an hour before bed time and 100 mg in am  Pantoprazole (protonix) 40 mg   Take  30-60 min before first meal of the day and Pepcid (famotidine)  20 mg after supper until return to office GERD diet reviewed, bed blocks rec  Take tylenol instead of tylenol pm with benadryl   Please schedule a follow up office visit in 4 weeks, sooner if needed- bring inhalers    09/23/2023  f/u ov/ office/[name] re: *** maint on ***  No chief complaint on file.   Dyspnea:  *** Cough: *** Sleeping: ***   resp cc  SABA use: *** 02: ***  Lung cancer screening: ***   No obvious day to day or daytime variability or assoc excess/ purulent sputum or mucus plugs or hemoptysis or cp or chest tightness, subjective wheeze or overt sinus or hb symptoms.    Also denies any obvious fluctuation of symptoms with weather or environmental  changes or other aggravating or alleviating factors except as outlined above   No unusual exposure hx or h/o childhood pna/ asthma or knowledge of premature birth.  Current Allergies, Complete Past Medical History, Past Surgical History, Family History, and Social History were reviewed in [employer] record.  ROS  The following are not active complaints unless bolded Hoarseness, sore throat, dysphagia, dental problems, itching, sneezing,  nasal congestion or discharge of excess mucus or purulent secretions, ear ache,   fever, chills, sweats, unintended wt loss or wt gain, classically pleuritic or exertional cp,  orthopnea pnd or arm/hand swelling  or leg swelling, presyncope, palpitations, abdominal pain, anorexia, nausea, vomiting, diarrhea  or change in bowel habits or change in bladder habits, change in stools or change in  urine, dysuria, hematuria,  rash, arthralgias, visual complaints, headache, numbness, weakness or ataxia or problems with walking or coordination,  change in mood or  memory.        No outpatient medications have been marked as taking for the 09/23/23 encounter (Appointment) with Nyoka Cowden, MD.                 Past Medical History:  Diagnosis Date   Anxiety    Arthritis    Depression    GERD (gastroesophageal reflux disease)    Hypertension    Hypothyroid    Hypothyroidism    Moderate persistent asthma 01/20/2021   PONV (postoperative nausea and vomiting)    Recurrent upper respiratory infection (URI)    Urticaria       Objective:    Wts  09/23/2023      ***  08/26/23 177 lb 9.6 oz (80.6 kg)  07/29/23 179 lb (81.2 kg)  07/01/23 178 lb (80.7 kg)    Vital signs reviewed  09/23/2023  - Note at rest 02 sats  ***% on ***   General appearance:    ***          Assessment

## 2023-09-22 NOTE — Telephone Encounter (Signed)
Prescription Request  09/22/2023  LOV: Visit date not found  What is the name of the medication or equipment? Valtrex 1 gm  Have you contacted your pharmacy to request a refill? Yes   Which pharmacy would you like this sent to?   [facility]   Patient notified that their request is being sent to the clinical staff for review and that they should receive a response within 2 business days.   Please advise at Mobile [phone] (mobile)

## 2023-09-22 NOTE — Telephone Encounter (Signed)
Patient was seen last week and now how a fever blister. She is requesting something called into [facility]

## 2023-09-23 ENCOUNTER — Ambulatory Visit: Payer: PPO | Admitting: Internal Medicine

## 2023-09-28 ENCOUNTER — Ambulatory Visit
Admission: RE | Admit: 2023-09-28 | Discharge: 2023-09-28 | Disposition: A | Discharge status: Home / Self Care | Payer: PPO | Source: Ambulatory Visit | Attending: Nurse Practitioner | Admitting: Nurse Practitioner

## 2023-09-28 VITALS — BP 132/82 | HR 77 | Temp 98.3°F | Resp 18

## 2023-09-28 DIAGNOSIS — J0140 Acute pansinusitis, unspecified: Secondary | ICD-10-CM

## 2023-09-28 DIAGNOSIS — L719 Rosacea, unspecified: Secondary | ICD-10-CM

## 2023-09-28 MED ORDER — TRIAMCINOLONE ACETONIDE 0.025 % EX CREA: TOPICAL_CREAM | Freq: Two times a day (BID) | CUTANEOUS | 0 refills | Status: AC | PRN

## 2023-09-28 MED ORDER — DOXYCYCLINE HYCLATE 100 MG PO TABS: 100.0000 mg | ORAL_TABLET | Freq: Two times a day (BID) | ORAL | 0 refills | Status: AC

## 2023-09-28 NOTE — Discharge Instructions (Signed)
Take medication as prescribed. Begin cetirizine (Zyrtec) daily each morning.  You can resume your Singulair at bedtime. Continue your current allergy medications. Increase fluids and allow for plenty of rest. Normal saline nasal spray throughout the day to help with nasal congestion and runny nose. If you develop a worsening cough, recommend using a humidifier in your bedroom at nighttime during sleep and sleeping elevated on pillows while symptoms persist. Apply cool compresses to the face as needed to help with redness, itching, and irritation. Keep the face clean and dry.  You may need to avoid wearing make-up while symptoms persist. As discussed, it is recommended that you follow-up with dermatology to discuss maintenance medications for control of your rosacea. If symptoms fail to improve with this treatment, you may follow-up with your PCP or in this clinic for further evaluation. Follow-up as needed.

## 2023-09-28 NOTE — ED Provider Notes (Signed)
[facility]    CSN: [id number] Arrival date & time: 09/28/23  1356      History   Chief Complaint Chief Complaint  Patient presents with   Facial Pain    HPI [name] is a 67 y.o. female.   The history is provided by the patient.   Patient presents for complaints of headache, dizziness, nasal congestion, runny nose, and a rosacea flare.  Patient states her upper respiratory symptoms have been present for more than 2 weeks.  States that she was seen by her PCP at the beginning of this month, at which time, symptoms have been present for 2 weeks.  Patient states she was started on doxycycline 100 mg to cover for a sinus infection and for her rosacea flare.  Patient states she did take 5 days of the prescription (chart reports patient was prescribed a 7-day course), and states symptoms improved.  She states immediately after stopping the doxycycline, her upper respiratory symptoms and rosacea began to flare again.  She states that she has sinus pressure on the right side of her face, itching to her face, redness, and also complains of throat scratchiness and drainage.  Patient denies fever, chills, ear pain, wheezing, difficulty breathing, abdominal pain, nausea, vomiting, or diarrhea.  Patient reports that she has seen dermatology in the past for her rosacea, but she has been unable to get an appointment.  She states that she normally takes Singulair, but stopped the Singulair when she started the doxycycline as she remembers being told by physician that she cannot take Singulair with an antibiotic.  Patient reports she has been using Flonase, hydrocortisone cream, and other over-the-counter medications for her symptoms.  Past Medical History:  Diagnosis Date   Anxiety    Arthritis    Depression    GERD (gastroesophageal reflux disease)    Hypertension    Hypothyroid    Hypothyroidism    Moderate persistent asthma 01/20/2021   PONV (postoperative nausea and vomiting)     Recurrent upper respiratory infection (URI)    Urticaria     Patient Active Problem List   Diagnosis Date Noted   Chronic asthma, mild intermittent, uncomplicated 07/29/2023   DOE (dyspnea on exertion) 07/29/2023   Heel pain 05/30/2023   Other fatigue 03/23/2023   Insomnia 03/23/2023   Obesity (BMI 30-39.9) 01/15/2023   Osteoarthritis of carpometacarpal (CMC) joint of thumb 11/04/2022   Ganglion cyst 11/04/2022   Trigger thumb of right hand 10/07/2022   Arthritis of carpometacarpal (CMC) joint of left thumb 07/29/2022   Primary osteoarthritis involving multiple joints 05/15/2022   Asthma 03/04/2022   CAD (coronary artery disease) 03/04/2022   Anxiety and depression 03/04/2022   Tear of right rotator cuff 02/11/2022   Healthcare maintenance 09/18/2021   IBS (irritable bowel syndrome) 07/31/2021   Impingement syndrome of right shoulder region 01/02/2020   Prediabetes 12/13/2017   Cervical spondylolysis 07/28/2017   Herniated lumbar disc without myelopathy 07/28/2017   Lumbar spondylosis 07/28/2017   Hyperlipidemia 12/04/2016   Acne rosacea 03/07/2014   Gastroesophageal reflux disease 09/19/2012   Essential hypertension 07/25/2012   Hypothyroidism 07/25/2012    Past Surgical History:  Procedure Laterality Date   BACK SURGERY     BIOPSY  06/03/2020   Procedure: BIOPSY;  Surgeon: Malissa Hippo, MD;  [facility]: [facility];  Service: Endoscopy;;   CARPAL TUNNEL RELEASE  06/16/2012   Procedure: CARPAL TUNNEL RELEASE;  Surgeon: Dominica Severin, MD;  Location: [facility];  Service: Orthopedics;  Laterality: Right;  limited open carpal tunnel release   CERVICAL FUSION  2018   CHOLECYSTECTOMY     COLONOSCOPY     COLONOSCOPY WITH PROPOFOL N/A 06/03/2020   Procedure: COLONOSCOPY WITH PROPOFOL;  Surgeon: Malissa Hippo, MD;  Location: [facility];  Service: Endoscopy;  Laterality: N/A;  115   ESOPHAGOGASTRODUODENOSCOPY (EGD) WITH PROPOFOL N/A 06/03/2020    Procedure: ESOPHAGOGASTRODUODENOSCOPY (EGD) WITH PROPOFOL;  Surgeon: Malissa Hippo, MD;  Location: [facility];  Service: Endoscopy;  Laterality: N/A;   TONSILLECTOMY     TRIGGER FINGER RELEASE  06/16/2012   Procedure: RELEASE TRIGGER FINGER/A-1 PULLEY;  Surgeon: Dominica Severin, MD;  Location: [facility];  Service: Orthopedics;  Laterality: Right;  right middle a-1 release   TUBAL LIGATION     UPPER GASTROINTESTINAL ENDOSCOPY     VAGINAL HYSTERECTOMY      OB History   No obstetric history on file.      Home Medications    Prior to Admission medications   Medication Sig Start Date End Date Taking? Authorizing Provider  doxycycline (VIBRA-TABS) 100 MG tablet Take 1 tablet (100 mg total) by mouth 2 (two) times daily for 10 days. 09/28/23 10/08/23 Yes Leath-Warren, Sadie Haber, NP  triamcinolone 0.025%-Cerave equivalent 1:1 cream mixture Apply topically 2 (two) times daily as needed. 09/28/23  Yes Leath-Warren, Sadie Haber, NP  budesonide-formoterol (SYMBICORT) 80-4.5 MCG/ACT inhaler 2 puff every 12 hours 08/26/23   Nyoka Cowden, MD  famotidine (PEPCID) 20 MG tablet One after supper 07/29/23   Nyoka Cowden, MD  fluorometholone (FML) 0.1 % ophthalmic suspension SMARTSIG:In Eye(s) 03/24/23   [provider]  fluticasone (FLONASE) 50 MCG/ACT nasal spray Place 2 sprays into both nostrils daily. 06/21/23   Birder Robson, MD  fluticasone-salmeterol (ADVAIR HFA) [phone] MCG/ACT inhaler Inhale 2 puffs into the lungs 2 (two) times daily. take 2 puffs first thing in am and then another 2 puffs about 12 hours later. 09/02/23   Nyoka Cowden, MD  gabapentin (NEURONTIN) 100 MG capsule Take 100 mg by mouth at bedtime as needed (back pain).    [provider]  levothyroxine (SYNTHROID) 125 MCG tablet TAKE (1) TABLET BY MOUTH ONCE DAILY BEFORE BREAKFAST. 08/26/23   [name], DO  linaclotide (LINZESS) 72 MCG capsule Take 1 capsule (72 mcg total) by mouth daily before  breakfast. 12/25/21   Marguerita Merles, Reuel Boom, MD  losartan (COZAAR) 100 MG tablet TAKE (1) TABLET BY MOUTH ONCE DAILY. 03/23/23   [name], DO  montelukast (SINGULAIR) 10 MG tablet Take 1 tablet (10 mg total) by mouth at bedtime. 06/21/23   Birder Robson, MD  polyethylene glycol powder (GLYCOLAX/MIRALAX) 17 GM/SCOOP powder Take 17 g by mouth daily. 06/03/20   Rehman, Joline Maxcy, MD  rosuvastatin (CRESTOR) 20 MG tablet Take 1 tablet (20 mg total) by mouth daily. 03/23/23   [name], DO  saline (AYR) GEL Place 1 application into both nostrils daily as needed (dryness).    [provider]  sertraline (ZOLOFT) 100 MG tablet TAKE 1 AND 1/2 TABLETS BY MOUTH ONCE DAILY. 05/13/23   [name], DO  Sodium Fluoride (PREVIDENT 5000 BOOSTER) 1.1 % PSTE Place 1 application onto teeth at bedtime. 10/09/20   Annalee Genta, DO  Spacer/Aero-Holding Chambers DEVI 1 Device by Does not apply route as directed. 07/22/23   Alfonse Spruce, MD  valACYclovir (VALTREX) 1000 MG tablet Take 2 tabs po  now then repeat dose in 12 hours for fever blisters 09/22/23   [name], DO    Family History Family History  Problem Relation Age of Onset   COPD Mother    Osteoporosis Mother    Immunodeficiency Mother    COPD Father    Emphysema Father    Tuberculosis Father    Lung disease Father    COPD Sister    Healthy Daughter    Healthy Son    Arthritis Other    Lung disease Other    Cancer Other    Asthma Other    Eczema Neg Hx    Atopy Neg Hx    Angioedema Neg Hx    Allergic rhinitis Neg Hx     Social History Social History   Tobacco Use   Smoking status: Former    Current packs/day: 0.00    Average packs/day: 1.5 packs/day for 27.0 years (40.5 ttl pk-yrs)    Types: Cigarettes    Start date: 85    Quit date: 2000    Years since quitting: 24.8   Smokeless tobacco: Never  Vaping Use   Vaping status: Never Used  Substance Use Topics   Alcohol use: No   Drug use: No      Allergies   Penicillins   Review of Systems Review of Systems Per HPI  Physical Exam Triage Vital Signs ED Triage Vitals  Encounter Vitals Group     BP 09/28/23 1408 132/82     Systolic BP Percentile --      Diastolic BP Percentile --      Pulse Rate 09/28/23 1408 77     Resp 09/28/23 1408 18     Temp 09/28/23 1408 98.3 F (36.8 C)     Temp src --      SpO2 09/28/23 1408 95 %     Weight --      Height --      Head Circumference --      Peak Flow --      Pain Score 09/28/23 1406 3     Pain Loc --      Pain Education --      Exclude from Growth Chart --    No data found.  Updated Vital Signs BP 132/82   Pulse 77   Temp 98.3 F (36.8 C)   Resp 18   SpO2 95%   Visual Acuity Right Eye Distance:   Left Eye Distance:   Bilateral Distance:    Right Eye Near:   Left Eye Near:    Bilateral Near:     Physical Exam Vitals and nursing note reviewed.  Constitutional:      General: She is not in acute distress.    Appearance: Normal appearance.  HENT:     Head: Normocephalic.     Right Ear: Tympanic membrane, ear canal and external ear normal.     Left Ear: Tympanic membrane, ear canal and external ear normal.     Nose:     Right Turbinates: Enlarged and swollen.     Left Turbinates: Enlarged and swollen.     Right Sinus: Maxillary sinus tenderness and frontal sinus tenderness present.     Left Sinus: No maxillary sinus tenderness or frontal sinus tenderness.     Mouth/Throat:     Mouth: Mucous membranes are moist.     Pharynx: No posterior oropharyngeal erythema.  Eyes:     Extraocular Movements: Extraocular movements intact.  Pupils: Pupils are equal, round, and reactive to light.  Cardiovascular:     Rate and Rhythm: Normal rate and regular rhythm.     Pulses: Normal pulses.     Heart sounds: Normal heart sounds.  Pulmonary:     Effort: Pulmonary effort is normal. No respiratory distress.     Breath sounds: Normal breath sounds. No stridor. No  wheezing, rhonchi or rales.  Abdominal:     General: Bowel sounds are normal.     Palpations: Abdomen is soft.  Musculoskeletal:     Cervical back: Normal range of motion.  Lymphadenopathy:     Cervical: No cervical adenopathy.  Skin:    General: Skin is warm and dry.     Findings: Erythema and rash present.     Comments: Generalized redness noted to the face.  Increased redness noted to the right side of the face that extends from underneath the right eye along side of the bridge of the nose.  There is no oozing, fluctuance, or drainage present.  Neurological:     General: No focal deficit present.     Mental Status: She is alert and oriented to person, place, and time.  Psychiatric:        Mood and Affect: Mood normal.        Behavior: Behavior normal.      UC Treatments / Results  Labs (all labs ordered are listed, but only abnormal results are displayed) Labs Reviewed - No data to display  EKG   Radiology No results found.  Procedures Procedures (including critical care time)  Medications Ordered in UC Medications - No data to display  Initial Impression / Assessment and Plan / UC Course  I have reviewed the triage vital signs and the nursing notes.  Pertinent labs & imaging results that were available during my care of the patient were reviewed by me and considered in my medical decision making (see chart for details).  On exam, patient has continued right-sided facial sinus tenderness, she is also exhibiting signs of headache and dizziness.  Suspect continued sinusitis.  With regard to her facial rash, do suspect patient is having another acute flare of her rosacea.  Will treat with doxycycline 100 mg twice daily for the next 10 days, and triamcinolone cream 0.025% with CeraVe to apply to the face as needed.  Supportive care recommendations were provided and discussed with the patient to include beginning use of over-the-counter antihistamine, and restarting her  Singulair, keeping the face clean and dry, and recommendations to follow-up with dermatology for continued maintenance and control of her rosacea.  Patient was advised to follow-up with her PCP/dermatology if symptoms do not improve.  Patient is in agreement with this plan of care and verbalizes understanding.  All questions were answered.  Patient stable for discharge.  Final Clinical Impressions(s) / UC Diagnoses   Final diagnoses:  Acute pansinusitis, recurrence not specified  Rosacea, acne     Discharge Instructions      Take medication as prescribed. Begin cetirizine (Zyrtec) daily each morning.  You can resume your Singulair at bedtime. Continue your current allergy medications. Increase fluids and allow for plenty of rest. Normal saline nasal spray throughout the day to help with nasal congestion and runny nose. If you develop a worsening cough, recommend using a humidifier in your bedroom at nighttime during sleep and sleeping elevated on pillows while symptoms persist. Apply cool compresses to the face as needed to help with redness, itching, and irritation.  Keep the face clean and dry.  You may need to avoid wearing make-up while symptoms persist. As discussed, it is recommended that you follow-up with dermatology to discuss maintenance medications for control of your rosacea. If symptoms fail to improve with this treatment, you may follow-up with your PCP or in this clinic for further evaluation. Follow-up as needed.     ED Prescriptions     Medication Sig Dispense Auth. Provider   doxycycline (VIBRA-TABS) 100 MG tablet Take 1 tablet (100 mg total) by mouth 2 (two) times daily for 10 days. 20 tablet Leath-Warren, Sadie Haber, NP   triamcinolone 0.025%-Cerave equivalent 1:1 cream mixture Apply topically 2 (two) times daily as needed. 454 g Leath-Warren, Sadie Haber, NP      PDMP not reviewed this encounter.   Abran Cantor, NP 09/28/23 [phone]

## 2023-09-28 NOTE — ED Triage Notes (Signed)
Pt presents with complaints of sinus issues.  Reports 2 weeks ago she had a rosacea flare up and the creases of her nose was peeling.   Now patient is having pain on the right side of her face under her eye, itching on her face, redness on her face, throat feels scratchy and drainage out of her right nose.   She took doxycycline for 5 days and started feeling better but now feels worse again.

## 2023-09-30 DIAGNOSIS — L2989 Other pruritus: Secondary | ICD-10-CM | POA: Diagnosis not present

## 2023-09-30 DIAGNOSIS — L2089 Other atopic dermatitis: Secondary | ICD-10-CM | POA: Diagnosis not present

## 2023-09-30 DIAGNOSIS — L718 Other rosacea: Secondary | ICD-10-CM | POA: Diagnosis not present

## 2023-09-30 DIAGNOSIS — L708 Other acne: Secondary | ICD-10-CM | POA: Diagnosis not present

## 2023-10-20 ENCOUNTER — Telehealth: Payer: Self-pay | Admitting: *Deleted

## 2023-10-20 MED ORDER — FAMOTIDINE 20 MG PO TABS: ORAL_TABLET | ORAL | 1 refills | Status: AC

## 2023-10-20 NOTE — Telephone Encounter (Signed)
Patient would like a 90 day prescription of Famotidine 20mg  sent over to [facility] in [place].  Call patient if needed. [phone]

## 2023-10-20 NOTE — Telephone Encounter (Signed)
Rx sent to pharmacy   

## 2023-11-15 ENCOUNTER — Other Ambulatory Visit: Payer: Self-pay | Admitting: Family Medicine

## 2023-12-06 ENCOUNTER — Other Ambulatory Visit: Payer: Self-pay | Admitting: Family Medicine

## 2023-12-06 DIAGNOSIS — F419 Anxiety disorder, unspecified: Secondary | ICD-10-CM

## 2023-12-20 ENCOUNTER — Ambulatory Visit: Payer: PPO | Admitting: Family Medicine

## 2023-12-20 VITALS — BP 146/88 | HR 81 | Temp 97.5°F | Ht 61.5 in | Wt 179.0 lb

## 2023-12-20 DIAGNOSIS — R5383 Other fatigue: Secondary | ICD-10-CM

## 2023-12-20 DIAGNOSIS — F32A Depression, unspecified: Secondary | ICD-10-CM | POA: Diagnosis not present

## 2023-12-20 DIAGNOSIS — F419 Anxiety disorder, unspecified: Secondary | ICD-10-CM | POA: Diagnosis not present

## 2023-12-20 DIAGNOSIS — R7303 Prediabetes: Secondary | ICD-10-CM

## 2023-12-20 DIAGNOSIS — E038 Other specified hypothyroidism: Secondary | ICD-10-CM

## 2023-12-20 DIAGNOSIS — N898 Other specified noninflammatory disorders of vagina: Secondary | ICD-10-CM

## 2023-12-20 DIAGNOSIS — E7849 Other hyperlipidemia: Secondary | ICD-10-CM

## 2023-12-20 DIAGNOSIS — I10 Essential (primary) hypertension: Secondary | ICD-10-CM | POA: Diagnosis not present

## 2023-12-20 MED ORDER — BUPROPION HCL ER (XL) 150 MG PO TB24: 150.0000 mg | ORAL_TABLET | Freq: Every day | ORAL | 1 refills | Status: DC

## 2023-12-20 MED ORDER — SERTRALINE HCL 100 MG PO TABS
100.0000 mg | ORAL_TABLET | Freq: Every day | ORAL | Status: DC
Start: 2023-12-20 — End: 2024-08-03

## 2023-12-20 MED ORDER — ESTRADIOL 0.1 MG/GM VA CREA: 1.0000 | TOPICAL_CREAM | Freq: Every day | VAGINAL | 12 refills | Status: DC

## 2023-12-20 NOTE — Patient Instructions (Signed)
 Medications as prescribed.  Labs ordered.  Follow up in 3-6 months.

## 2023-12-20 NOTE — Telephone Encounter (Signed)
 Copied from CRM [id number]. Topic: Clinical - Medication Refill >> Dec 20, 2023  2:24 PM Benton KIDD wrote: Most Recent Primary Care Visit:  Provider: MAURO ELVERIA BROCKS  Department: [facility]  Visit Type: OFFICE VISIT  Date: 09/09/2023  Medication: nystatin  30 g 100,000 units per gram cream nystatin  30 g 100,000 units per gram powder  Has the patient contacted their pharmacy? Yes they said to contact doctor (Agent: If no, request that the patient contact the pharmacy for the refill. If patient does not wish to contact the pharmacy document the reason why and proceed with request.) (Agent: If yes, when and what did the pharmacy advise?)  Is this the correct pharmacy for this prescription? Yes If no, delete pharmacy and type the correct one.  This is the patient's preferred pharmacy:  [facility] [place], [place] - [id number] [place] [address] Phone: [phone] Fax: [phone]  [place] [facility] [address] [place] [address] Phone: [phone] Fax: [phone]  [facility] - [place], [place] - [address] [place] [address] Phone: [phone] Fax: [phone]   Has the prescription been filled recently? No  Is the patient out of the medication? Yes  Has the patient been seen for an appointment in the last year OR does the patient have an upcoming appointment? Yes  Can we respond through MyChart? Yes  Agent: Please be advised that Rx refills may take up to 3 business days. We ask that you follow-up with your pharmacy.

## 2023-12-21 DIAGNOSIS — E038 Other specified hypothyroidism: Secondary | ICD-10-CM | POA: Diagnosis not present

## 2023-12-21 DIAGNOSIS — N898 Other specified noninflammatory disorders of vagina: Secondary | ICD-10-CM | POA: Insufficient documentation

## 2023-12-21 DIAGNOSIS — E7849 Other hyperlipidemia: Secondary | ICD-10-CM | POA: Diagnosis not present

## 2023-12-21 DIAGNOSIS — R7303 Prediabetes: Secondary | ICD-10-CM | POA: Diagnosis not present

## 2023-12-21 DIAGNOSIS — R5383 Other fatigue: Secondary | ICD-10-CM | POA: Diagnosis not present

## 2023-12-21 DIAGNOSIS — I10 Essential (primary) hypertension: Secondary | ICD-10-CM | POA: Diagnosis not present

## 2023-12-21 NOTE — Assessment & Plan Note (Signed)
 Uncontrolled.  Adding Wellbutrin.

## 2023-12-21 NOTE — Progress Notes (Signed)
 Subjective:  Patient ID: [name], female    DOB: 07-25-1956  Age: 68 y.o. MRN: [id number]  CC:   Chief Complaint  Patient presents with   check thyroid     Very dry mouth, Hair falling out by the handfulls, sleeping 12 to 14 hrs   refill nystatin  cream and powder    HPI:  68 year old female presents for evaluation of the above.  Patient states that she would like her thyroid  to be checked.  She states that she is having hair loss, fatigue, dry mouth.  She is concerned that her thyroid  dosing needs to be changed.  Patient also states that she is having vaginal itching and irritation.  No discharge.  She has been using nystatin  cream.  Additionally, patient states that she is concerned that her Zoloft  is not working.  She states that 150 mg is too much but 100 mg seems to not be enough.  She states that she is irritable anxiety and depression are not controlled.  Patient Active Problem List   Diagnosis Date Noted   Vaginal itching 12/21/2023   Chronic asthma, mild intermittent, uncomplicated 07/29/2023   Other fatigue 03/23/2023   Insomnia 03/23/2023   Obesity (BMI 30-39.9) 01/15/2023   Ganglion cyst 11/04/2022   Primary osteoarthritis involving multiple joints 05/15/2022   CAD (coronary artery disease) 03/04/2022   Anxiety and depression 03/04/2022   Healthcare maintenance 09/18/2021   IBS (irritable bowel syndrome) 07/31/2021   Prediabetes 12/13/2017   Cervical spondylolysis 07/28/2017   Lumbar spondylosis 07/28/2017   Hyperlipidemia 12/04/2016   Acne rosacea 03/07/2014   Gastroesophageal reflux disease 09/19/2012   Essential hypertension 07/25/2012   Hypothyroidism 07/25/2012    Social Hx   Social History   Socioeconomic History   Marital status: Married    Spouse name: Not on file   Number of children: Not on file   Years of education: 12   Highest education level: 12th grade  Occupational History   Not on file  Tobacco Use   Smoking status: Former     Current packs/day: 0.00    Average packs/day: 1.5 packs/day for 27.0 years (40.5 ttl pk-yrs)    Types: Cigarettes    Start date: 42    Quit date: 2000    Years since quitting: 25.0   Smokeless tobacco: Never  Vaping Use   Vaping status: Never Used  Substance and Sexual Activity   Alcohol use: No   Drug use: No   Sexual activity: Not on file  Other Topics Concern   Not on file  Social History Narrative   Not on file   Social Drivers of Health   Financial Resource Strain: Low Risk  (04/28/2023)   Overall Financial Resource Strain (CARDIA)    Difficulty of Paying Living Expenses: Not hard at all  Food Insecurity: No Food Insecurity (04/28/2023)   Hunger Vital Sign    Worried About Running Out of Food in the Last Year: Never true    Ran Out of Food in the Last Year: Never true  Transportation Needs: No Transportation Needs (04/28/2023)   PRAPARE - Administrator, Civil Service (Medical): No    Lack of Transportation (Non-Medical): No  Physical Activity: Insufficiently Active (04/28/2023)   Exercise Vital Sign    Days of Exercise per Week: 3 days    Minutes of Exercise per Session: 20 min  Stress: No Stress Concern Present (04/28/2023)   [employer] of Occupational Health - Occupational Stress Questionnaire  Feeling of Stress : Only a little  Social Connections: Moderately Isolated (04/28/2023)   Social Connection and Isolation Panel [NHANES]    Frequency of Communication with Friends and Family: Three times a week    Frequency of Social Gatherings with Friends and Family: Once a week    Attends Religious Services: Never    Diplomatic Services Operational Officer: No    Attends Engineer, Structural: Not on file    Marital Status: Married    Review of Systems Per HPI  Objective:  BP (!) 146/88   Pulse 81   Temp (!) 97.5 F (36.4 C)   Ht 5' 1.5 (1.562 m)   Wt 179 lb (81.2 kg)   SpO2 95%   BMI 33.27 kg/m      12/20/2023    4:31 PM  09/28/2023    2:08 PM 09/09/2023   11:20 AM  BP/Weight  Systolic BP 146 132 139  Diastolic BP 88 82 92  Wt. (Lbs) 179  175.4  BMI 33.27 kg/m2  32.6 kg/m2    Physical Exam Vitals and nursing note reviewed. Exam conducted with a chaperone present.  Constitutional:      General: She is not in acute distress.    Appearance: Normal appearance.  HENT:     Head: Normocephalic and atraumatic.  Cardiovascular:     Rate and Rhythm: Normal rate and regular rhythm.  Pulmonary:     Effort: Pulmonary effort is normal.     Breath sounds: Normal breath sounds. No wheezing, rhonchi or rales.  Genitourinary:    Comments: No appreciable vaginal discharge.  Inner aspect of the labia and the introitus is erythematous and dry. Neurological:     Mental Status: She is alert.  Psychiatric:        Mood and Affect: Mood normal.        Behavior: Behavior normal.     Lab Results  Component Value Date   WBC 11.0 (H) 01/14/2023   HGB 13.9 01/14/2023   HCT 42.6 01/14/2023   PLT 237 01/14/2023   GLUCOSE 84 01/14/2023   CHOL 147 01/14/2023   TRIG 76 01/14/2023   HDL 74 01/14/2023   LDLCALC 58 01/14/2023   ALT 27 01/14/2023   AST 21 01/14/2023   NA 141 01/14/2023   K 4.4 01/14/2023   CL 104 01/14/2023   CREATININE 0.84 01/14/2023   BUN 15 01/14/2023   CO2 25 01/14/2023   TSH 1.790 05/28/2023   INR 0.9 10/21/2007   HGBA1C 5.8 (H) 06/16/2021     Assessment & Plan:   Problem List Items Addressed This Visit       Cardiovascular and Mediastinum   Essential hypertension   Relevant Orders   CMP14+EGFR     Endocrine   Hypothyroidism   Labs for further evaluation.  Will alter dosing of Synthroid  if needed based on labs.      Relevant Orders   TSH + free T4     Genitourinary   Vaginal itching   No evidence of yeast vaginitis.  Likely from low estrogen state.  Trial of Estrace .        Other   Prediabetes   Relevant Orders   CMP14+EGFR   Other fatigue   Relevant Orders   CBC    Hyperlipidemia   Relevant Orders   Lipid panel   Anxiety and depression   Uncontrolled.  Adding Wellbutrin .      Relevant Medications   sertraline  (ZOLOFT ) 100 MG  tablet   buPROPion  (WELLBUTRIN  XL) 150 MG 24 hr tablet    Meds ordered this encounter  Medications   sertraline  (ZOLOFT ) 100 MG tablet    Sig: Take 1 tablet (100 mg total) by mouth daily.   buPROPion  (WELLBUTRIN  XL) 150 MG 24 hr tablet    Sig: Take 1 tablet (150 mg total) by mouth daily.    Dispense:  90 tablet    Refill:  1   estradiol  (ESTRACE  VAGINAL) 0.1 MG/GM vaginal cream    Sig: Place 1 Applicatorful vaginally at bedtime. For 2 weeks, then 3 times per week. May also be applied directly to vulvar and vestibular tissue as well.    Dispense:  42.5 g    Refill:  12    Follow-up:  3-6 months  Tykiera Raven Bluford DO [facility]

## 2023-12-21 NOTE — Assessment & Plan Note (Signed)
 No evidence of yeast vaginitis.  Likely from low estrogen state.  Trial of Estrace.

## 2023-12-21 NOTE — Assessment & Plan Note (Signed)
 Labs for further evaluation.  Will alter dosing of Synthroid if needed based on labs.

## 2023-12-22 ENCOUNTER — Other Ambulatory Visit: Payer: Self-pay | Admitting: Family Medicine

## 2023-12-22 ENCOUNTER — Ambulatory Visit: Payer: Self-pay | Admitting: Family Medicine

## 2023-12-22 LAB — CBC
Hematocrit: 45.1 % (ref 34.0–46.6)
Hemoglobin: 14.8 g/dL (ref 11.1–15.9)
MCH: 31.9 pg (ref 26.6–33.0)
MCHC: 32.8 g/dL (ref 31.5–35.7)
MCV: 97 fL (ref 79–97)
Platelets: 194 10*3/uL (ref 150–450)
RBC: 4.64 x10E6/uL (ref 3.77–5.28)
RDW: 12.8 % (ref 11.7–15.4)
WBC: 6.7 10*3/uL (ref 3.4–10.8)

## 2023-12-22 LAB — CMP14+EGFR
ALT: 24 [IU]/L (ref 0–32)
AST: 21 [IU]/L (ref 0–40)
Albumin: 4.5 g/dL (ref 3.9–4.9)
Alkaline Phosphatase: 79 [IU]/L (ref 44–121)
BUN/Creatinine Ratio: 13 (ref 12–28)
BUN: 12 mg/dL (ref 8–27)
Bilirubin Total: 0.3 mg/dL (ref 0.0–1.2)
CO2: 23 mmol/L (ref 20–29)
Calcium: 9.6 mg/dL (ref 8.7–10.3)
Chloride: 103 mmol/L (ref 96–106)
Creatinine, Ser: 0.96 mg/dL (ref 0.57–1.00)
Globulin, Total: 2.9 g/dL (ref 1.5–4.5)
Glucose: 97 mg/dL (ref 70–99)
Potassium: 4.6 mmol/L (ref 3.5–5.2)
Sodium: 141 mmol/L (ref 134–144)
Total Protein: 7.4 g/dL (ref 6.0–8.5)
eGFR: 65 mL/min/{1.73_m2} (ref >59)

## 2023-12-22 LAB — TSH+FREE T4
Free T4: 1.2 ng/dL (ref 0.82–1.77)
TSH: 5.18 u[IU]/mL — ABNORMAL HIGH (ref 0.450–4.500)

## 2023-12-22 LAB — LIPID PANEL
Chol/HDL Ratio: 2.6 {ratio} (ref 0.0–4.4)
Cholesterol, Total: 152 mg/dL (ref 100–199)
HDL: 58 mg/dL (ref >39)
LDL Chol Calc (NIH): 72 mg/dL (ref 0–99)
Triglycerides: 128 mg/dL (ref 0–149)
VLDL Cholesterol Cal: 22 mg/dL (ref 5–40)

## 2023-12-22 MED ORDER — LEVOTHYROXINE SODIUM 137 MCG PO TABS: ORAL_TABLET | ORAL | 0 refills | Status: DC

## 2023-12-22 MED ORDER — FLUCONAZOLE 150 MG PO TABS: 150.0000 mg | ORAL_TABLET | Freq: Once | ORAL | 0 refills | Status: AC

## 2023-12-22 NOTE — Telephone Encounter (Signed)
 Cook, Jayce G, DO     Rx sent for diflucan .

## 2023-12-22 NOTE — Telephone Encounter (Signed)
**Note De-identified  [place] Obfuscation** Please advise 

## 2023-12-22 NOTE — Telephone Encounter (Signed)
  Chief Complaint: Vaginal itching Symptoms: Vaginal itching Frequency: Worsening since yesterday afternoon Pertinent Negatives: Patient denies drainage, odor, Disposition: [] ED /[] Urgent Care (no appt availability in office) / [] Appointment(In office/virtual)/ []  [facility]/ [] Home Care/ [] Refused Recommended Disposition /[] [facility]/ [x]  Follow-up with PCP Additional Notes: Pt reports she was seen in office yesterday for vaginal itching and dryness. She reports the provider did not believe it was yeast and prescribed estradiol  cream. Pt reports she began this medication last evening as prescribed, she notes itching has worsened significantly. She states it was so severe overnight it awoken her from sleep and she had some bleeding from the scratching. Pt denies any drainage, foul odor, dysuria, hematuria, abd pain. Pt reports she is trying OTC/home remedies with little to no relief. Pt request provider provide recommendations/new prescription to help relieve the itching. This RN educated pt on home care, new-worsening symptoms, when to call back/seek emergent care. Pt verbalized understanding and agrees to plan. Forwarding to provider for review.   Copied from CRM [id number]. Topic: Clinical - Red Word Triage >> Dec 22, 2023 12:45 PM Twila L wrote: Red Word that prompted transfer to Nurse Triage: Doct gave patient cream (estradiol  vaginal cream), itching and now bloody Reason for Disposition  Mild localized itching  Answer Assessment - Initial Assessment Questions 1. DESCRIPTION: "Describe the itching you are having." "Where is it located?"     Vaginal itching, worsening since appt 2. SEVERITY: "How bad is it?"    - MILD: Doesn't interfere with normal activities.   - MODERATE-SEVERE: Interferes with work, school, sleep, or other activities.      Mod-Severe, waking her from sleep 3. SCRATCHING: "Are there any scratch marks? Bleeding?"     Yes 4. ONSET: "When did the  itching begin?"      Worsening since yesterday 5. CAUSE: "What do you think is causing the itching?"      Unknown 6. OTHER SYMPTOMS: "Do you have any other symptoms?"      None  Protocols used: Itching - Localized-A-AH

## 2023-12-22 NOTE — Telephone Encounter (Signed)
 Patient notified

## 2023-12-28 ENCOUNTER — Telehealth: Payer: Self-pay

## 2023-12-28 NOTE — Telephone Encounter (Signed)
Spoke with pt regarding pharmacy refill of cream, pt asked if there is anything else she can do to help her situation, dr Adriana Simas is recommending she be seen by Gynecology

## 2023-12-28 NOTE — Telephone Encounter (Signed)
Communication  Reason for CRM: estradiol (ESTRACE VAGINAL) 0.1 MG/GM vaginal cream says to use every night for two weeks, she said she's is completely out of it and she had been using it since January 13th. Patient called the pharmacy and they stated they cannot refill until February.   Please Advise

## 2023-12-29 NOTE — Telephone Encounter (Signed)
Spoke with pt and she said she is doing better today but will call back if it worsens

## 2024-02-01 ENCOUNTER — Telehealth: Payer: Self-pay | Admitting: Family Medicine

## 2024-02-01 ENCOUNTER — Other Ambulatory Visit: Payer: Self-pay

## 2024-02-01 DIAGNOSIS — N898 Other specified noninflammatory disorders of vagina: Secondary | ICD-10-CM

## 2024-02-01 NOTE — Telephone Encounter (Signed)
 Copied from CRM [id number]. Topic: Referral - Status >> Feb 01, 2024 10:14 AM Kristie Cowman wrote: Reason for CRM: Patient would like to be called on her cell phone [phone])

## 2024-02-01 NOTE — Telephone Encounter (Signed)
 Copied from CRM [id number]. Topic: Referral - Request for Referral >> Feb 01, 2024 10:09 AM [name] wrote: Did the patient discuss referral with their provider in the last year? Yes (If No - schedule appointment) (If Yes - send message)  Appointment offered? Yes  Type of order/referral and detailed reason for visit: The patent states she needs to see a gynecologist ASAP as she is having problems (wouldn't elaborate). The patient states she would like to get a the first available appointment.    Preference of office, provider, location: Dr. Tyrell Antonio   If referral order, have you been seen by this specialty before? No (If Yes, this issue or another issue? When? Where?  Can we respond through MyChart? No

## 2024-02-04 ENCOUNTER — Ambulatory Visit: Payer: PPO | Admitting: Obstetrics & Gynecology

## 2024-02-04 ENCOUNTER — Encounter: Payer: Self-pay | Admitting: Obstetrics & Gynecology

## 2024-02-04 VITALS — BP 163/92 | HR 81 | Ht 62.0 in | Wt 178.0 lb

## 2024-02-04 DIAGNOSIS — L900 Lichen sclerosus et atrophicus: Secondary | ICD-10-CM | POA: Diagnosis not present

## 2024-02-04 MED ORDER — CLOBETASOL PROPIONATE 0.05 % EX CREA: 1.0000 | TOPICAL_CREAM | Freq: Two times a day (BID) | CUTANEOUS | 11 refills | Status: AC

## 2024-02-04 NOTE — Progress Notes (Signed)
 Chief Complaint  Patient presents with   Vaginal Itching      68 y.o. No obstetric history on file. No LMP recorded. Patient has had a hysterectomy. The current method of family planning is status post hysterectomy.  Outpatient Encounter Medications as of 02/04/2024  Medication Sig Note   buPROPion (WELLBUTRIN XL) 150 MG 24 hr tablet Take 1 tablet (150 mg total) by mouth daily.    clobetasol cream (TEMOVATE) 0.05 % Apply 1 Application topically 2 (two) times daily.    estradiol (ESTRACE VAGINAL) 0.1 MG/GM vaginal cream Place 1 Applicatorful vaginally at bedtime. For 2 weeks, then 3 times per week. May also be applied directly to vulvar and vestibular tissue as well.    famotidine (PEPCID) 20 MG tablet One after supper    fluorometholone (FML) 0.1 % ophthalmic suspension SMARTSIG:In Eye(s)    fluticasone (FLONASE) 50 MCG/ACT nasal spray Place 2 sprays into both nostrils daily.    fluticasone-salmeterol (ADVAIR HFA) 45-21 MCG/ACT inhaler Inhale 2 puffs into the lungs 2 (two) times daily. take 2 puffs first thing in am and then another 2 puffs about 12 hours later.    gabapentin (NEURONTIN) 100 MG capsule Take 100 mg by mouth at bedtime as needed (back pain). 01/08/2022: PRN   levothyroxine (SYNTHROID) 137 MCG tablet TAKE (1) TABLET BY MOUTH ONCE DAILY BEFORE BREAKFAST.    linaclotide (LINZESS) 72 MCG capsule Take 1 capsule (72 mcg total) by mouth daily before breakfast.    losartan (COZAAR) 100 MG tablet TAKE (1) TABLET BY MOUTH ONCE DAILY.    montelukast (SINGULAIR) 10 MG tablet Take 1 tablet (10 mg total) by mouth at bedtime.    nystatin powder Apply 1 Application topically 3 (three) times daily.    polyethylene glycol powder (GLYCOLAX/MIRALAX) 17 GM/SCOOP powder Take 17 g by mouth daily.    rosuvastatin (CRESTOR) 20 MG tablet TAKE ONE TABLET BY MOUTH ONCE DAILY.    saline (AYR) GEL Place 1 application into both nostrils daily as needed (dryness).    sertraline (ZOLOFT) 100 MG  tablet Take 1 tablet (100 mg total) by mouth daily.    Sodium Fluoride (PREVIDENT 5000 BOOSTER) 1.1 % PSTE Place 1 application onto teeth at bedtime.    Spacer/Aero-Holding Chambers DEVI 1 Device by Does not apply route as directed.    terconazole (TERAZOL 7) 0.4 % vaginal cream Place 1 applicator vaginally at bedtime.    triamcinolone 0.025%-Cerave equivalent 1:1 cream mixture Apply topically 2 (two) times daily as needed.    budesonide-formoterol (SYMBICORT) 80-4.5 MCG/ACT inhaler 2 puff every 12 hours (Patient not taking: Reported on 02/04/2024)    valACYclovir (VALTREX) 1000 MG tablet Take 2 tabs po now then repeat dose in 12 hours for fever blisters (Patient not taking: Reported on 02/04/2024)    No facility-administered encounter medications on file as of 02/04/2024.    Subjective Severe itching irritation for nearly 1 year Has been on E2 + anti fungals Past Medical History:  Diagnosis Date   Anxiety    Arthritis    Depression    GERD (gastroesophageal reflux disease)    Hypertension    Hypothyroid    Hypothyroidism    Moderate persistent asthma 01/20/2021   PONV (postoperative nausea and vomiting)    Recurrent upper respiratory infection (URI)    Urticaria     Past Surgical History:  Procedure Laterality Date   BACK SURGERY     BIOPSY  06/03/2020   Procedure: BIOPSY;  Surgeon: Karilyn Cota,  Joline Maxcy, MD;  [facility];  Service: Endoscopy;;   CARPAL TUNNEL RELEASE  06/16/2012   Procedure: CARPAL TUNNEL RELEASE;  Surgeon: Dominica Severin, MD;  Location: [facility];  Service: Orthopedics;  Laterality: Right;  limited open carpal tunnel release   CERVICAL FUSION  2018   CHOLECYSTECTOMY     COLONOSCOPY     COLONOSCOPY WITH PROPOFOL N/A 06/03/2020   Procedure: COLONOSCOPY WITH PROPOFOL;  Surgeon: Malissa Hippo, MD;  [facility]: [facility];  Service: Endoscopy;  Laterality: N/A;  115   ESOPHAGOGASTRODUODENOSCOPY (EGD) WITH PROPOFOL N/A 06/03/2020   Procedure:  ESOPHAGOGASTRODUODENOSCOPY (EGD) WITH PROPOFOL;  Surgeon: Malissa Hippo, MD;  Location: [facility];  Service: Endoscopy;  Laterality: N/A;   TONSILLECTOMY     TRIGGER FINGER RELEASE  06/16/2012   Procedure: RELEASE TRIGGER FINGER/A-1 PULLEY;  Surgeon: Dominica Severin, MD;  Location: [facility];  Service: Orthopedics;  Laterality: Right;  right middle a-1 release   TUBAL LIGATION     UPPER GASTROINTESTINAL ENDOSCOPY     VAGINAL HYSTERECTOMY      OB History   No obstetric history on file.     Allergies  Allergen Reactions   Penicillins Hives    Social History   Socioeconomic History   Marital status: Married    Spouse name: Not on file   Number of children: Not on file   Years of education: 12   Highest education level: 12th grade  Occupational History   Not on file  Tobacco Use   Smoking status: Former    Current packs/day: 0.00    Average packs/day: 1.5 packs/day for 27.0 years (40.5 ttl pk-yrs)    Types: Cigarettes    Start date: 79    Quit date: 2000    Years since quitting: 25.2   Smokeless tobacco: Never  Vaping Use   Vaping status: Never Used  Substance and Sexual Activity   Alcohol use: No   Drug use: No   Sexual activity: Not on file  Other Topics Concern   Not on file  Social History Narrative   Not on file   Social Drivers of Health   Financial Resource Strain: Low Risk  (02/04/2024)   Overall Financial Resource Strain (CARDIA)    Difficulty of Paying Living Expenses: Not hard at all  Food Insecurity: No Food Insecurity (02/04/2024)   Hunger Vital Sign    Worried About Running Out of Food in the Last Year: Never true    Ran Out of Food in the Last Year: Never true  Transportation Needs: No Transportation Needs (02/04/2024)   PRAPARE - Administrator, Civil Service (Medical): No    Lack of Transportation (Non-Medical): No  Physical Activity: Insufficiently Active (02/04/2024)   Exercise Vital Sign    Days of Exercise  per Week: 2 days    Minutes of Exercise per Session: 20 min  Stress: No Stress Concern Present (02/04/2024)   [employer] of Occupational Health - Occupational Stress Questionnaire    Feeling of Stress : Only a little  Social Connections: Socially Integrated (02/04/2024)   Social Connection and Isolation Panel [NHANES]    Frequency of Communication with Friends and Family: Twice a week    Frequency of Social Gatherings with Friends and Family: Once a week    Attends Religious Services: 1 to 4 times per year    Active Member of [employer] or Organizations: Yes    Attends Banker  Meetings: 1 to 4 times per year    Marital Status: Married    Family History  Problem Relation Age of Onset   COPD Mother    Osteoporosis Mother    Immunodeficiency Mother    COPD Father    Emphysema Father    Tuberculosis Father    Lung disease Father    COPD Sister    Healthy Daughter    Healthy Son    Arthritis Other    Lung disease Other    Cancer Other    Asthma Other    Eczema Neg Hx    Atopy Neg Hx    Angioedema Neg Hx    Allergic rhinitis Neg Hx     Medications:       Current Outpatient Medications:    buPROPion (WELLBUTRIN XL) 150 MG 24 hr tablet, Take 1 tablet (150 mg total) by mouth daily., Disp: 90 tablet, Rfl: 1   clobetasol cream (TEMOVATE) 0.05 %, Apply 1 Application topically 2 (two) times daily., Disp: 30 g, Rfl: 11   estradiol (ESTRACE VAGINAL) 0.1 MG/GM vaginal cream, Place 1 Applicatorful vaginally at bedtime. For 2 weeks, then 3 times per week. May also be applied directly to vulvar and vestibular tissue as well., Disp: 42.5 g, Rfl: 12   famotidine (PEPCID) 20 MG tablet, One after supper, Disp: 90 tablet, Rfl: 1   fluorometholone (FML) 0.1 % ophthalmic suspension, SMARTSIG:In Eye(s), Disp: , Rfl:    fluticasone (FLONASE) 50 MCG/ACT nasal spray, Place 2 sprays into both nostrils daily., Disp: 16 g, Rfl: 5   fluticasone-salmeterol (ADVAIR HFA) 45-21 MCG/ACT inhaler,  Inhale 2 puffs into the lungs 2 (two) times daily. take 2 puffs first thing in am and then another 2 puffs about 12 hours later., Disp: 1 each, Rfl: 12   gabapentin (NEURONTIN) 100 MG capsule, Take 100 mg by mouth at bedtime as needed (back pain)., Disp: , Rfl:    levothyroxine (SYNTHROID) 137 MCG tablet, TAKE (1) TABLET BY MOUTH ONCE DAILY BEFORE BREAKFAST., Disp: 90 tablet, Rfl: 0   linaclotide (LINZESS) 72 MCG capsule, Take 1 capsule (72 mcg total) by mouth daily before breakfast., Disp: 90 capsule, Rfl: 3   losartan (COZAAR) 100 MG tablet, TAKE (1) TABLET BY MOUTH ONCE DAILY., Disp: 90 tablet, Rfl: 3   montelukast (SINGULAIR) 10 MG tablet, Take 1 tablet (10 mg total) by mouth at bedtime., Disp: 30 tablet, Rfl: 5   nystatin powder, Apply 1 Application topically 3 (three) times daily., Disp: , Rfl:    polyethylene glycol powder (GLYCOLAX/MIRALAX) 17 GM/SCOOP powder, Take 17 g by mouth daily., Disp: 507 g, Rfl: 5   rosuvastatin (CRESTOR) 20 MG tablet, TAKE ONE TABLET BY MOUTH ONCE DAILY., Disp: 90 tablet, Rfl: 1   saline (AYR) GEL, Place 1 application into both nostrils daily as needed (dryness)., Disp: , Rfl:    sertraline (ZOLOFT) 100 MG tablet, Take 1 tablet (100 mg total) by mouth daily., Disp: , Rfl:    Sodium Fluoride (PREVIDENT 5000 BOOSTER) 1.1 % PSTE, Place 1 application onto teeth at bedtime., Disp: 100 g, Rfl: 3   Spacer/Aero-Holding Chambers DEVI, 1 Device by Does not apply route as directed., Disp: 1 each, Rfl: 1   terconazole (TERAZOL 7) 0.4 % vaginal cream, Place 1 applicator vaginally at bedtime., Disp: , Rfl:    triamcinolone 0.025%-Cerave equivalent 1:1 cream mixture, Apply topically 2 (two) times daily as needed., Disp: 454 g, Rfl: 0   budesonide-formoterol (SYMBICORT) 80-4.5 MCG/ACT inhaler, 2 puff every  12 hours (Patient not taking: Reported on 02/04/2024), Disp: 1 each, Rfl: 12   valACYclovir (VALTREX) 1000 MG tablet, Take 2 tabs po now then repeat dose in 12 hours for fever  blisters (Patient not taking: Reported on 02/04/2024), Disp: 4 tablet, Rfl: 6  Objective Blood pressure (!) 163/92, pulse 81, height 5\' 2"  (1.575 m), weight 178 lb (80.7 kg).  Severe changes consistent with LSA no othe rabnormalities  Pertinent ROS No burning with urination, frequency or urgency No nausea, vomiting or diarrhea Nor fever chills or other constitutional symptoms   Labs or studies     Impression + Management Plan: Diagnoses this Encounter::   ICD-10-CM   1. Lichen sclerosus et atrophicus: flares since 04/2023  L90.0    has been prescribed estrogen cream, anti fungals without any success        Medications prescribed during  this encounter: Meds ordered this encounter  Medications   clobetasol cream (TEMOVATE) 0.05 %    Sig: Apply 1 Application topically 2 (two) times daily.    Dispense:  30 g    Refill:  11    Labs or Scans Ordered during this encounter: No orders of the defined types were placed in this encounter.     Follow up Return in about 1 month (around 03/03/2024) for removal of vulvar skin lesions and follow up, with Dr Despina Hidden.

## 2024-03-06 ENCOUNTER — Encounter: Payer: Self-pay | Admitting: Obstetrics & Gynecology

## 2024-03-06 ENCOUNTER — Ambulatory Visit: Payer: PPO | Admitting: Obstetrics & Gynecology

## 2024-03-06 VITALS — BP 140/75 | HR 67

## 2024-03-06 DIAGNOSIS — L900 Lichen sclerosus et atrophicus: Secondary | ICD-10-CM

## 2024-03-06 NOTE — Progress Notes (Signed)
 Follow up appointment for results: LSA  Chief Complaint  Patient presents with   Follow-up    Blood pressure (!) 140/75, pulse 67.  Pt states her symptoms have completely resolved with the clobetasol therapy  Exam improvement in tissue appearance  MEDS ordered this encounter: No orders of the defined types were placed in this encounter.   Orders for this encounter: No orders of the defined types were placed in this encounter.   Impression + Management Plan   ICD-10-CM   1. Lichen sclerosus et atrophicus: flares since 04/2023: decrease clobetasol to nightly from BID  L90.0       Follow Up: Return in about 3 months (around 06/05/2024) for Follow up, with Dr Despina Hidden.     All questions were answered.  Past Medical History:  Diagnosis Date   Anxiety    Arthritis    Depression    GERD (gastroesophageal reflux disease)    Hypertension    Hypothyroid    Hypothyroidism    Moderate persistent asthma 01/20/2021   PONV (postoperative nausea and vomiting)    Recurrent upper respiratory infection (URI)    Urticaria     Past Surgical History:  Procedure Laterality Date   BACK SURGERY     BIOPSY  06/03/2020   Procedure: BIOPSY;  Surgeon: Malissa Hippo, MD;  [facility]: [facility];  Service: Endoscopy;;   CARPAL TUNNEL RELEASE  06/16/2012   Procedure: CARPAL TUNNEL RELEASE;  Surgeon: Dominica Severin, MD;  Location: [facility];  Service: Orthopedics;  Laterality: Right;  limited open carpal tunnel release   CERVICAL FUSION  2018   CHOLECYSTECTOMY     COLONOSCOPY     COLONOSCOPY WITH PROPOFOL N/A 06/03/2020   Procedure: COLONOSCOPY WITH PROPOFOL;  Surgeon: Malissa Hippo, MD;  [facility];  Service: Endoscopy;  Laterality: N/A;  115   ESOPHAGOGASTRODUODENOSCOPY (EGD) WITH PROPOFOL N/A 06/03/2020   Procedure: ESOPHAGOGASTRODUODENOSCOPY (EGD) WITH PROPOFOL;  Surgeon: Malissa Hippo, MD;  [facility]: [facility];  Service: Endoscopy;  Laterality: N/A;    TONSILLECTOMY     TRIGGER FINGER RELEASE  06/16/2012   Procedure: RELEASE TRIGGER FINGER/A-1 PULLEY;  Surgeon: Dominica Severin, MD;  Location: [facility];  Service: Orthopedics;  Laterality: Right;  right middle a-1 release   TUBAL LIGATION     UPPER GASTROINTESTINAL ENDOSCOPY     VAGINAL HYSTERECTOMY      OB History   No obstetric history on file.     Allergies  Allergen Reactions   Penicillins Hives    Social History   Socioeconomic History   Marital status: Married    Spouse name: Not on file   Number of children: Not on file   Years of education: 12   Highest education level: 12th grade  Occupational History   Not on file  Tobacco Use   Smoking status: Former    Current packs/day: 0.00    Average packs/day: 1.5 packs/day for 27.0 years (40.5 ttl pk-yrs)    Types: Cigarettes    Start date: 58    Quit date: 2000    Years since quitting: 25.2   Smokeless tobacco: Never  Vaping Use   Vaping status: Never Used  Substance and Sexual Activity   Alcohol use: No   Drug use: No   Sexual activity: Not on file  Other Topics Concern   Not on file  Social History Narrative   Not on file   Social Drivers of Health  Financial Resource Strain: Low Risk  (02/04/2024)   Overall Financial Resource Strain (CARDIA)    Difficulty of Paying Living Expenses: Not hard at all  Food Insecurity: No Food Insecurity (02/04/2024)   Hunger Vital Sign    Worried About Running Out of Food in the Last Year: Never true    Ran Out of Food in the Last Year: Never true  Transportation Needs: No Transportation Needs (02/04/2024)   PRAPARE - Administrator, Civil Service (Medical): No    Lack of Transportation (Non-Medical): No  Physical Activity: Insufficiently Active (02/04/2024)   Exercise Vital Sign    Days of Exercise per Week: 2 days    Minutes of Exercise per Session: 20 min  Stress: No Stress Concern Present (02/04/2024)   [employer] of Occupational  Health - Occupational Stress Questionnaire    Feeling of Stress : Only a little  Social Connections: Socially Integrated (02/04/2024)   Social Connection and Isolation Panel [NHANES]    Frequency of Communication with Friends and Family: Twice a week    Frequency of Social Gatherings with Friends and Family: Once a week    Attends Religious Services: 1 to 4 times per year    Active Member of [employer] or Organizations: Yes    Attends Engineer, structural: 1 to 4 times per year    Marital Status: Married    Family History  Problem Relation Age of Onset   COPD Mother    Osteoporosis Mother    Immunodeficiency Mother    COPD Father    Emphysema Father    Tuberculosis Father    Lung disease Father    COPD Sister    Healthy Daughter    Healthy Son    Arthritis Other    Lung disease Other    Cancer Other    Asthma Other    Eczema Neg Hx    Atopy Neg Hx    Angioedema Neg Hx    Allergic rhinitis Neg Hx

## 2024-04-12 ENCOUNTER — Ambulatory Visit: Admitting: Family Medicine

## 2024-04-13 ENCOUNTER — Encounter (INDEPENDENT_AMBULATORY_CARE_PROVIDER_SITE_OTHER): Payer: Self-pay | Admitting: Gastroenterology

## 2024-04-13 ENCOUNTER — Telehealth (INDEPENDENT_AMBULATORY_CARE_PROVIDER_SITE_OTHER): Payer: Self-pay | Admitting: *Deleted

## 2024-04-13 ENCOUNTER — Encounter (INDEPENDENT_AMBULATORY_CARE_PROVIDER_SITE_OTHER): Payer: Self-pay

## 2024-04-13 ENCOUNTER — Ambulatory Visit (INDEPENDENT_AMBULATORY_CARE_PROVIDER_SITE_OTHER): Admitting: Gastroenterology

## 2024-04-13 ENCOUNTER — Other Ambulatory Visit (INDEPENDENT_AMBULATORY_CARE_PROVIDER_SITE_OTHER): Payer: Self-pay | Admitting: *Deleted

## 2024-04-13 VITALS — BP 149/86 | HR 80 | Temp 97.4°F | Ht 62.0 in | Wt 177.6 lb

## 2024-04-13 DIAGNOSIS — K581 Irritable bowel syndrome with constipation: Secondary | ICD-10-CM

## 2024-04-13 DIAGNOSIS — K638219 Small intestinal bacterial overgrowth, unspecified: Secondary | ICD-10-CM | POA: Diagnosis not present

## 2024-04-13 MED ORDER — CIPROFLOXACIN HCL 500 MG PO TABS: 500.0000 mg | ORAL_TABLET | Freq: Two times a day (BID) | ORAL | 0 refills | Status: DC

## 2024-04-13 MED ORDER — RIFAXIMIN 550 MG PO TABS: 550.0000 mg | ORAL_TABLET | Freq: Three times a day (TID) | ORAL | 0 refills | Status: AC

## 2024-04-13 MED ORDER — PRUCALOPRIDE SUCCINATE 2 MG PO TABS
2.0000 mg | ORAL_TABLET | Freq: Every day | ORAL | 1 refills | Status: DC
Start: 2024-04-13 — End: 2024-07-17

## 2024-04-13 NOTE — Patient Instructions (Signed)
 I am recommending a bowel cleanse, please mix 64 oz of gatorade/crystal light or propel with 8.3 oz of miralax  and consume this. Once you feel cleaned out, you can start motegrity  2mg  daily for constipation (let me know if this medication is too expensive) I am also sending xifaxan to help with your bloating/SIBO/IBS flare. If this is too expensive or not covered by insurance we can send in cipro . Increase water intake, aim for atleast 64 oz per day Increase fruits, veggies and whole grains, kiwi and prunes are especially good for constipation Please let me know if symptoms do not improve  Follow up 2 months  It was a pleasure to see you today. I want to create trusting relationships with patients and provide genuine, compassionate, and quality care. I truly value your feedback! please be on the lookout for a survey regarding your visit with me today. I appreciate your input about our visit and your time in completing this!    Marlin Brys L. Chava Dulac, MSN, APRN, AGNP-C Adult-Gerontology Nurse Practitioner [facility]

## 2024-04-13 NOTE — Progress Notes (Addendum)
 Referring Provider: Cook, Jayce G, DO Primary Care Physician:  Cook, Jayce G, DO Primary GI Physician: DR. Sammi Crick   Chief Complaint  Patient presents with   Irritable Bowel Syndrome    Follow up on IBS with constipation. Having some issues with constipation. Linzess  72 mcg did not provide relief.    HPI:   [name] is a 68 y.o. female with past medical history of  IBS-C, anxiety, depression, GERD, hypertension, hypothyroidism, asthma, recurrent SIBO   Patient presenting today for:  IBS and history of recurrent sibo  Last seen August 2022, at that time chronic episodes of constipation for over 30 years.  Taking MiraLAX  to Every day he comes in complaining of flatulence.  Some watery diarrhea at times.  Occasional epigastric pain.  Did not tolerate Metamucil in the past.  Had some scalp itching on Linzess .  Patient recommended to start Amitiza  8 mcg every 12 hours, stop MiraLAX   Unfortunately amitiza  was too expensive therefore she was advised to take linzess  and miralax  if needed   Present: States that she has had long history of IBS and intermittent flares of SIBO, she notes that in the past she was given cipro  by Dr. Homero Luster for recurrent SIBO which helped. She has tried linzess  in the past without improvement. Amitiza  was too expensive, as was motegrity . For the past 2 weeks she has had more bloating, constipation. Similar presentation as in the past with SIBO flares.  She notes that she has done a gentle laxative and miralax  for about 1 week but no good BMs for some time. She is unsure of her last good BM. She has tried increasing water intake. She is very bloated, passing small amount of flatus. She has some nausea due to not moving her bowels. Appetite is good. Currently taking 2 capfuls of miralax  daily, very small amount of stool passing. No rectal bleeding or melena. She has not tried a higher dose of linzess     Last EGD: 06/03/2020 - Normal hypopharynx. - Normal  esophagus. - Z-line irregular, 36 cm from the incisors. - Erythematous mucosa in the antrum. Biopsied. - Normal duodenal bulb and second portion of the duodenum. Biopsied Last Colonoscopy: 06/03/2020  -  Diverticulosis in the sigmoid colon. - External hemorrhoids. - No specimens collected.   Filed Weights   04/13/24 1152  Weight: 177 lb 9.6 oz (80.6 kg)     Past Medical History:  Diagnosis Date   Anxiety    Arthritis    Depression    GERD (gastroesophageal reflux disease)    Hypertension    Hypothyroid    Hypothyroidism    Moderate persistent asthma 01/20/2021   PONV (postoperative nausea and vomiting)    Recurrent upper respiratory infection (URI)    Urticaria     Past Surgical History:  Procedure Laterality Date   BACK SURGERY     BIOPSY  06/03/2020   Procedure: BIOPSY;  Surgeon: Ruby Corporal, MD;  [facility];  Service: Endoscopy;;   CARPAL TUNNEL RELEASE  06/16/2012   Procedure: CARPAL TUNNEL RELEASE;  Surgeon: Ronn Cohn, MD;  Location: [facility];  Service: Orthopedics;  Laterality: Right;  limited open carpal tunnel release   CERVICAL FUSION  2018   CHOLECYSTECTOMY     COLONOSCOPY     COLONOSCOPY WITH PROPOFOL  N/A 06/03/2020   Procedure: COLONOSCOPY WITH PROPOFOL ;  Surgeon: Ruby Corporal, MD;  Location: [facility];  Service: Endoscopy;  Laterality: N/A;  115  ESOPHAGOGASTRODUODENOSCOPY (EGD) WITH PROPOFOL  N/A 06/03/2020   Procedure: ESOPHAGOGASTRODUODENOSCOPY (EGD) WITH PROPOFOL ;  Surgeon: Ruby Corporal, MD;  Location: [facility];  Service: Endoscopy;  Laterality: N/A;   TONSILLECTOMY     TRIGGER FINGER RELEASE  06/16/2012   Procedure: RELEASE TRIGGER FINGER/A-1 PULLEY;  Surgeon: Ronn Cohn, MD;  Location: [facility];  Service: Orthopedics;  Laterality: Right;  right middle a-1 release   TUBAL LIGATION     UPPER GASTROINTESTINAL ENDOSCOPY     VAGINAL HYSTERECTOMY      Current Outpatient Medications   Medication Sig Dispense Refill   budesonide -formoterol  (SYMBICORT ) 80-4.5 MCG/ACT inhaler 2 puff every 12 hours 1 each 12   buPROPion  (WELLBUTRIN  XL) 150 MG 24 hr tablet Take 1 tablet (150 mg total) by mouth daily. 90 tablet 1   clobetasol  cream (TEMOVATE ) 0.05 % Apply 1 Application topically 2 (two) times daily. 30 g 11   estradiol  (ESTRACE  VAGINAL) 0.1 MG/GM vaginal cream Place 1 Applicatorful vaginally at bedtime. For 2 weeks, then 3 times per week. May also be applied directly to vulvar and vestibular tissue as well. 42.5 g 12   famotidine  (PEPCID ) 20 MG tablet One after supper 90 tablet 1   fluticasone  (FLONASE ) 50 MCG/ACT nasal spray Place 2 sprays into both nostrils daily. 16 g 5   fluticasone -salmeterol (ADVAIR HFA) 45-21 MCG/ACT inhaler Inhale 2 puffs into the lungs 2 (two) times daily. take 2 puffs first thing in am and then another 2 puffs about 12 hours later. 1 each 12   gabapentin  (NEURONTIN ) 100 MG capsule Take 100 mg by mouth at bedtime as needed (back pain).     levothyroxine  (SYNTHROID ) 137 MCG tablet TAKE (1) TABLET BY MOUTH ONCE DAILY BEFORE BREAKFAST. 90 tablet 0   losartan  (COZAAR ) 100 MG tablet TAKE (1) TABLET BY MOUTH ONCE DAILY. 90 tablet 3   montelukast  (SINGULAIR ) 10 MG tablet Take 1 tablet (10 mg total) by mouth at bedtime. 30 tablet 5   nystatin  powder Apply 1 Application topically 3 (three) times daily.     polyethylene glycol powder (GLYCOLAX /MIRALAX ) 17 GM/SCOOP powder Take 17 g by mouth daily. 507 g 5   rosuvastatin  (CRESTOR ) 20 MG tablet TAKE ONE TABLET BY MOUTH ONCE DAILY. 90 tablet 1   saline (AYR) GEL Place 1 application into both nostrils daily as needed (dryness).     sertraline  (ZOLOFT ) 100 MG tablet Take 1 tablet (100 mg total) by mouth daily.     Sodium Fluoride  (PREVIDENT 5000 BOOSTER) 1.1 % PSTE Place 1 application onto teeth at bedtime. 100 g 3   Spacer/Aero-Holding Chambers DEVI 1 Device by Does not apply route as directed. 1 each 1   triamcinolone   0.025%-Cerave equivalent 1:1 cream mixture Apply topically 2 (two) times daily as needed. 454 g 0   valACYclovir  (VALTREX ) 1000 MG tablet Take 2 tabs po now then repeat dose in 12 hours for fever blisters 4 tablet 6   linaclotide  (LINZESS ) 72 MCG capsule Take 1 capsule (72 mcg total) by mouth daily before breakfast. (Patient not taking: Reported on 04/13/2024) 90 capsule 3   No current facility-administered medications for this visit.    Allergies as of 04/13/2024 - Review Complete 04/13/2024  Allergen Reaction Noted   Penicillins Hives 04/20/2012    Social History   Socioeconomic History   Marital status: Married    Spouse name: Not on file   Number of children: Not on file   Years of education: 12   Highest education  level: 12th grade  Occupational History   Not on file  Tobacco Use   Smoking status: Former    Current packs/day: 0.00    Average packs/day: 1.5 packs/day for 27.0 years (40.5 ttl pk-yrs)    Types: Cigarettes    Start date: 38    Quit date: 2000    Years since quitting: 25.3   Smokeless tobacco: Never  Vaping Use   Vaping status: Never Used  Substance and Sexual Activity   Alcohol use: No   Drug use: No   Sexual activity: Not on file  Other Topics Concern   Not on file  Social History Narrative   Not on file   Social Drivers of Health   Financial Resource Strain: Low Risk  (02/04/2024)   Overall Financial Resource Strain (CARDIA)    Difficulty of Paying Living Expenses: Not hard at all  Food Insecurity: No Food Insecurity (02/04/2024)   Hunger Vital Sign    Worried About Running Out of Food in the Last Year: Never true    Ran Out of Food in the Last Year: Never true  Transportation Needs: No Transportation Needs (02/04/2024)   PRAPARE - Administrator, Civil Service (Medical): No    Lack of Transportation (Non-Medical): No  Physical Activity: Insufficiently Active (02/04/2024)   Exercise Vital Sign    Days of Exercise per Week: 2 days     Minutes of Exercise per Session: 20 min  Stress: No Stress Concern Present (02/04/2024)   [employer] of Occupational Health - Occupational Stress Questionnaire    Feeling of Stress : Only a little  Social Connections: Socially Integrated (02/04/2024)   Social Connection and Isolation Panel [NHANES]    Frequency of Communication with Friends and Family: Twice a week    Frequency of Social Gatherings with Friends and Family: Once a week    Attends Religious Services: 1 to 4 times per year    Active Member of [employer] or Organizations: Yes    Attends Banker Meetings: 1 to 4 times per year    Marital Status: Married    Review of systems General: negative for malaise, night sweats, fever, chills, weight loss Neck: Negative for lumps, goiter, pain and significant neck swelling Resp: Negative for cough, wheezing, dyspnea at rest CV: Negative for chest pain, leg swelling, palpitations, orthopnea GI: denies melena, hematochezia, nausea, diarrhea, dysphagia, odyonophagia, early satiety or unintentional weight loss. +abdominal bloating +constipation +nausea  The remainder of the review of systems is noncontributory.  Physical Exam: BP (!) 149/86   Pulse 80   Temp (!) 97.4 F (36.3 C)   Ht 5\' 2"  (1.575 m)   Wt 177 lb 9.6 oz (80.6 kg)   BMI 32.48 kg/m  General:   Alert and oriented. No distress noted. Pleasant and cooperative.  Head:  Normocephalic and atraumatic. Eyes:  Conjuctiva clear without scleral icterus. Mouth:  Oral mucosa pink and moist. Good dentition. No lesions. Heart: Normal rate and rhythm, s1 and s2 heart sounds present.  Lungs: Clear lung sounds in all lobes. Respirations equal and unlabored. Abdomen:  good BS, abdomen is mildly distended but soft. Non tender. No rebound or guarding. No HSM or masses noted. Neurologic:  Alert and  oriented x4 Psych:  Alert and cooperative. Normal mood and affect.  Invalid input(s): "6 MONTHS"   ASSESSMENT: [name] is a 67 y.o. female presenting today for IBS-C and recurrent SIBO  History of IBS with constipation and recurrent  SIBO, initially diagnosed via breath testing back in the 90s, in the past she has had good results with course of cipro . Xifaxan  in 2012 also provided some improvement. She notes recent flare over the past few weeks with worsening constipation, significant bloating. On linzess  72mcg previously which did not work. Does not appear she was ever on a higher dose of linzess . Amitiza  and motegrity  too expensive in the past, though motegrity  actually appears covered now whereas linzess  does not. Unsure of last good BM, is passing flatus and small amount of stool. No vomiting. She cannot tolerate bowel prep, therefore will do miralax  cleanse and start motegerity 2mg  daily thereafter. I will send xifaxan  course 550mg   TID x14 days for possible recurrent SIBO/IBS flare given her history and very similar presentation of previous episodes, if this is not covered by insurance will do 2 week course of cipro  500 mg BID.    PLAN:  -Increase water intake, aim for atleast 64 oz per day Increase fruits, veggies and whole grains, kiwi and prunes are especially good for constipation -miralax  bowel cleanse -start motegrity  2mg  daily after bowel cleanse -xifaxan  550mg  TID x 14 days, cipro  500mg  BID if xifaxan  not covered  All questions were answered, patient verbalized understanding and is in agreement with plan as outlined above.    Follow Up: 2 months   Hooper Petteway L. Adrien Alberta, MSN, APRN, AGNP-C Adult-Gerontology Nurse Practitioner [facility]  I have reviewed the note and agree with the APP's assessment as described in this progress note  Samantha Cress, MD Gastroenterology and Hepatology [facility]

## 2024-04-13 NOTE — Telephone Encounter (Signed)
 Patient seen today and prescribed xifiaxan. She said too expensive. She wanted changed to cipro  if you agree.   [place] [facility]  [phone]

## 2024-04-13 NOTE — Telephone Encounter (Signed)
 Patient notified on voicemail and med sent to pharmacy.

## 2024-04-20 ENCOUNTER — Telehealth: Payer: Self-pay | Admitting: *Deleted

## 2024-04-20 NOTE — Telephone Encounter (Signed)
 Copied from CRM [id number]. Topic: Clinical - Medication Question >> Apr 20, 2024  1:38 PM Oddis Bench wrote: Reason for CRM: Patient is calling about valACYclovir  (VALTREX ) 1000 MG tablet she is stating that the amount is not enough. She would like a call back from the nurse.

## 2024-04-21 NOTE — Telephone Encounter (Signed)
 Cook, Jayce G, DO     Please call the patient and see what the issue is.

## 2024-04-21 NOTE — Telephone Encounter (Signed)
 Left a message for a return call from patient to get more information about what changes are needed with her valtrex  prescriptions

## 2024-04-24 ENCOUNTER — Other Ambulatory Visit: Payer: Self-pay | Admitting: Family Medicine

## 2024-04-24 ENCOUNTER — Telehealth: Payer: Self-pay

## 2024-04-24 NOTE — Telephone Encounter (Signed)
 Communication  Reason for CRM: Patient is calling in because she is requesting an increase in her Valtrex  dosage. Patient says due to the current season she is more prone to flare ups. Patient is asking for a multi-dosage so she can take the meds as they blisters come in. Patient is requesting a call back.

## 2024-04-25 ENCOUNTER — Other Ambulatory Visit: Payer: Self-pay | Admitting: Family Medicine

## 2024-04-25 MED ORDER — VALACYCLOVIR HCL 1 G PO TABS: ORAL_TABLET | ORAL | 6 refills | Status: AC

## 2024-04-27 ENCOUNTER — Other Ambulatory Visit: Payer: Self-pay | Admitting: Family Medicine

## 2024-05-02 ENCOUNTER — Ambulatory Visit (INDEPENDENT_AMBULATORY_CARE_PROVIDER_SITE_OTHER): Admitting: Nurse Practitioner

## 2024-05-02 VITALS — BP 135/81 | Ht 62.0 in | Wt 176.8 lb

## 2024-05-02 DIAGNOSIS — F419 Anxiety disorder, unspecified: Secondary | ICD-10-CM | POA: Diagnosis not present

## 2024-05-02 DIAGNOSIS — R079 Chest pain, unspecified: Secondary | ICD-10-CM | POA: Diagnosis not present

## 2024-05-02 DIAGNOSIS — K219 Gastro-esophageal reflux disease without esophagitis: Secondary | ICD-10-CM

## 2024-05-02 DIAGNOSIS — F32A Depression, unspecified: Secondary | ICD-10-CM

## 2024-05-02 DIAGNOSIS — E038 Other specified hypothyroidism: Secondary | ICD-10-CM | POA: Diagnosis not present

## 2024-05-03 ENCOUNTER — Ambulatory Visit: Payer: Self-pay | Admitting: Nurse Practitioner

## 2024-05-03 ENCOUNTER — Encounter: Payer: Self-pay | Admitting: Nurse Practitioner

## 2024-05-03 LAB — TSH: TSH: 0.682 u[IU]/mL (ref 0.450–4.500)

## 2024-05-03 LAB — T4, FREE: Free T4: 1.32 ng/dL (ref 0.82–1.77)

## 2024-05-03 MED ORDER — BUPROPION HCL ER (XL) 300 MG PO TB24: 300.0000 mg | ORAL_TABLET | Freq: Every day | ORAL | 0 refills | Status: DC

## 2024-05-03 NOTE — Progress Notes (Signed)
 Subjective:    Patient ID: [name], female    DOB: 27-Nov-1956, 68 y.o.   MRN: [id number]  HPI Presents for recheck after an episode of chest pain 2 days ago.  Describes as a sudden sharp pain across her chest into her shoulder.  Lasted around 10 seconds, definitely less than 1 minute.  Has not had any further symptoms before or since.  Slight nausea.  No shortness of breath.  Took one of her husbands nitroglycerin  at the time it happened and the symptoms resolved.  States he has a history of heart disease and was worried about this being related to her heart.  Has been under more stress lately.  Has a history of reflux disease, usually only has flareups with certain foods.  No longer on daily medication but takes famotidine  as needed.  Drinks 4 to 5 cups of coffee each morning.  Non-smoker.  No alcohol use.  No excessive NSAID use.  Has noticed more fatigue lately, sleeping 12 to 15 hours a day.  Currently on bupropion  and sertraline  which has been working well in the past but not as well lately.   Review of Systems  Constitutional:  Positive for fatigue.  HENT:  Negative for sore throat and trouble swallowing.   Respiratory:  Negative for cough, chest tightness, shortness of breath and wheezing.        No unusual cough.  No orthopnea.  Cardiovascular:  Positive for chest pain. Negative for leg swelling.  Gastrointestinal:  Positive for abdominal pain and nausea. Negative for vomiting.       Occasional mild epigastric area discomfort.      05/02/2024    3:05 PM  Depression screen PHQ 2/9  Decreased Interest 2  Down, Depressed, Hopeless 2  PHQ - 2 Score 4  Altered sleeping 3  Tired, decreased energy 3  Change in appetite 2  Feeling bad or failure about yourself  1  Trouble concentrating 2  Moving slowly or fidgety/restless 1  Suicidal thoughts 0  PHQ-9 Score 16  Difficult doing work/chores Somewhat difficult      05/02/2024    3:05 PM 12/20/2023    4:33 PM 09/09/2023    11:31 AM 07/01/2023   10:45 AM  GAD 7 : Generalized Anxiety Score  Nervous, Anxious, on Edge 2 2 0 1  Control/stop worrying 2 0 0 0  Worry too much - different things 2 0 0 0  Trouble relaxing 2 2 1 1   Restless 2 2 1 1   Easily annoyed or irritable 3 0 1 1  Afraid - awful might happen 0 0 0 0  Total GAD 7 Score 13 6 3 4   Anxiety Difficulty Somewhat difficult Somewhat difficult Not difficult at all Somewhat difficult         Objective:   Physical Exam NAD.  Alert, oriented.  Calm cheerful affect.  Making good eye contact.  Speech clear.  Thoughts logical coherent and relevant.  Normal judgment and behavior.  TMs mild clear effusion, no erythema.  Pharynx clear and moist.  Neck supple with mild soft anterior cervical adenopathy.  Lungs clear.  Heart regular rate rhythm.  EKG NSR.  Abdomen soft nondistended with minimal mid epigastric area discomfort on exam.  No rebound or guarding.  No obvious masses.  Lower extremities no edema. Today's Vitals   05/02/24 1455  BP: 135/81  Weight: 176 lb 12.8 oz (80.2 kg)  Height: 5\' 2"  (1.575 m)   Body mass index is  32.34 kg/m.       Assessment & Plan:   Problem List Items Addressed This Visit       Digestive   Gastroesophageal reflux disease     Endocrine   Hypothyroidism   Relevant Orders   TSH (Completed)   T4, free (Completed)     Other   Anxiety and depression   Relevant Medications   buPROPion  (WELLBUTRIN  XL) 300 MG 24 hr tablet   Other Visit Diagnoses       Chest pain, unspecified type    -  Primary   Relevant Orders   EKG 12-Lead (Completed)      Meds ordered this encounter  Medications   buPROPion  (WELLBUTRIN  XL) 300 MG 24 hr tablet    Sig: Take 1 tablet (300 mg total) by mouth daily.    Dispense:  90 tablet    Refill:  0    Supervising Provider:   Charlotta Cook A [[id number]]   Based on patient's description and physical findings, do not feel the chest pain is cardiac or respiratory in nature.  Most likely caused  by esophageal spasm.  Encourage patient to restart her famotidine  daily.  Also discussed lifestyle factors affecting her reflux including excessive caffeine intake and stress.  Increase bupropion  to 300 mg daily.  Warning signs reviewed.  Call or go to ED if any new or worsening symptoms.  Also call back if no improvement in her anxiety and depression with new dose of bupropion .

## 2024-05-09 NOTE — Telephone Encounter (Signed)
 See 04/24/24 message with response

## 2024-06-01 ENCOUNTER — Other Ambulatory Visit: Payer: Self-pay | Admitting: Family Medicine

## 2024-06-05 ENCOUNTER — Ambulatory Visit: Admitting: Family Medicine

## 2024-06-05 VITALS — BP 123/82 | HR 86 | Temp 98.4°F | Ht 62.0 in | Wt 175.0 lb

## 2024-06-05 DIAGNOSIS — R42 Dizziness and giddiness: Secondary | ICD-10-CM

## 2024-06-05 MED ORDER — ONDANSETRON 4 MG PO TBDP: 4.0000 mg | ORAL_TABLET | Freq: Three times a day (TID) | ORAL | 0 refills | Status: DC | PRN

## 2024-06-05 MED ORDER — MECLIZINE HCL 25 MG PO TABS: 25.0000 mg | ORAL_TABLET | Freq: Three times a day (TID) | ORAL | 0 refills | Status: DC | PRN

## 2024-06-05 NOTE — Assessment & Plan Note (Signed)
 Treating with Meclizine  and Zofran .

## 2024-06-05 NOTE — Progress Notes (Signed)
 Subjective:  Patient ID: [name], female    DOB: February 12, 1956  Age: 68 y.o. MRN: [id number]  CC:   Chief Complaint  Patient presents with   Dizziness    2 episodes in past week. One on Friday and one on Sunday    HPI:  68 year old female presents with dizziness.   Reports 2 episodes last week (Friday and Sunday). Sudden onset dizziness with associated nausea. Classic vertigo with sensation of spinning. Resolved with Meclizine  and Zofran . Zofran  expired. Meclizine  expires in July. Requesting new meds.   Patient Active Problem List   Diagnosis Date Noted   Vertigo 06/05/2024   Chronic asthma, mild intermittent, uncomplicated 07/29/2023   Insomnia 03/23/2023   Obesity (BMI 30-39.9) 01/15/2023   Ganglion cyst 11/04/2022   Primary osteoarthritis involving multiple joints 05/15/2022   CAD (coronary artery disease) 03/04/2022   Anxiety and depression 03/04/2022   Healthcare maintenance 09/18/2021   Irritable bowel syndrome with constipation 07/31/2021   Prediabetes 12/13/2017   Cervical spondylolysis 07/28/2017   Lumbar spondylosis 07/28/2017   Hyperlipidemia 12/04/2016   Acne rosacea 03/07/2014   Gastroesophageal reflux disease 09/19/2012   Small intestinal bacterial overgrowth (SIBO) 07/25/2012   Essential hypertension 07/25/2012   Hypothyroidism 07/25/2012    Social Hx   Social History   Socioeconomic History   Marital status: Married    Spouse name: Not on file   Number of children: Not on file   Years of education: 12   Highest education level: 12th grade  Occupational History   Not on file  Tobacco Use   Smoking status: Former    Current packs/day: 0.00    Average packs/day: 1.5 packs/day for 27.0 years (40.5 ttl pk-yrs)    Types: Cigarettes    Start date: 31    Quit date: 2000    Years since quitting: 25.5   Smokeless tobacco: Never  Vaping Use   Vaping status: Never Used  Substance and Sexual Activity   Alcohol use: No   Drug use: No    Sexual activity: Not on file  Other Topics Concern   Not on file  Social History Narrative   Not on file   Social Drivers of Health   Financial Resource Strain: Low Risk  (02/04/2024)   Overall Financial Resource Strain (CARDIA)    Difficulty of Paying Living Expenses: Not hard at all  Food Insecurity: No Food Insecurity (02/04/2024)   Hunger Vital Sign    Worried About Running Out of Food in the Last Year: Never true    Ran Out of Food in the Last Year: Never true  Transportation Needs: No Transportation Needs (02/04/2024)   PRAPARE - Administrator, Civil Service (Medical): No    Lack of Transportation (Non-Medical): No  Physical Activity: Insufficiently Active (02/04/2024)   Exercise Vital Sign    Days of Exercise per Week: 2 days    Minutes of Exercise per Session: 20 min  Stress: No Stress Concern Present (02/04/2024)   [employer] of Occupational Health - Occupational Stress Questionnaire    Feeling of Stress : Only a little  Social Connections: Socially Integrated (02/04/2024)   Social Connection and Isolation Panel    Frequency of Communication with Friends and Family: Twice a week    Frequency of Social Gatherings with Friends and Family: Once a week    Attends Religious Services: 1 to 4 times per year    Active Member of [employer] or Organizations: Yes  Attends Banker Meetings: 1 to 4 times per year    Marital Status: Married    Review of Systems Per HPI  Objective:  BP 123/82   Pulse 86   Temp 98.4 F (36.9 C)   Ht 5' 2 (1.575 m)   Wt 175 lb (79.4 kg)   SpO2 94%   BMI 32.01 kg/m      06/05/2024    3:53 PM 05/02/2024    2:55 PM 04/13/2024   11:58 AM  BP/Weight  Systolic BP 123 135 149  Diastolic BP 82 81 86  Wt. (Lbs) 175 176.8   BMI 32.01 kg/m2 32.34 kg/m2     Physical Exam Vitals and nursing note reviewed.  Constitutional:      General: She is not in acute distress.    Appearance: Normal appearance.  HENT:     Head:  Normocephalic and atraumatic.   Cardiovascular:     Rate and Rhythm: Normal rate and regular rhythm.  Pulmonary:     Effort: Pulmonary effort is normal.     Breath sounds: Normal breath sounds.   Neurological:     Mental Status: She is alert.   Psychiatric:        Mood and Affect: Mood normal.        Behavior: Behavior normal.     Lab Results  Component Value Date   WBC 6.7 12/21/2023   HGB 14.8 12/21/2023   HCT 45.1 12/21/2023   PLT 194 12/21/2023   GLUCOSE 97 12/21/2023   CHOL 152 12/21/2023   TRIG 128 12/21/2023   HDL 58 12/21/2023   LDLCALC 72 12/21/2023   ALT 24 12/21/2023   AST 21 12/21/2023   NA 141 12/21/2023   K 4.6 12/21/2023   CL 103 12/21/2023   CREATININE 0.96 12/21/2023   BUN 12 12/21/2023   CO2 23 12/21/2023   TSH 0.682 05/02/2024   INR 0.9 10/21/2007   HGBA1C 5.8 (H) 06/16/2021     Assessment & Plan:  Vertigo Assessment & Plan: Treating with Meclizine  and Zofran .  Orders: -     Meclizine  HCl; Take 1 tablet (25 mg total) by mouth 3 (three) times daily as needed for dizziness.  Dispense: 30 tablet; Refill: 0 -     Ondansetron ; Take 1 tablet (4 mg total) by mouth every 8 (eight) hours as needed for nausea or vomiting.  Dispense: 20 tablet; Refill: 0    Follow-up:  Return if symptoms worsen or fail to improve.  Jacqulyn Ahle DO [facility]

## 2024-06-06 ENCOUNTER — Telehealth: Payer: Self-pay | Admitting: Pharmacy Technician

## 2024-06-06 ENCOUNTER — Other Ambulatory Visit (HOSPITAL_COMMUNITY): Payer: Self-pay

## 2024-06-06 NOTE — Telephone Encounter (Signed)
 Pharmacy Patient Advocate Encounter   Received notification from CoverMyMeds that prior authorization for Ondansetron  4MG  dispersible tablets is required/requested.   Insurance verification completed.   The patient is insured through [facility] ADVANTAGE/RX ADVANCE .   Per test claim: Associated diagnosis for medication is not covered by the plan. Patient is using for Vertigo. [employer] will only cover for chemotherapy induced nausea/vomiting. Patient and pharmacy has been made aware.

## 2024-06-19 ENCOUNTER — Other Ambulatory Visit: Payer: Self-pay | Admitting: Family Medicine

## 2024-06-19 ENCOUNTER — Ambulatory Visit: Payer: PPO | Admitting: Family Medicine

## 2024-06-19 ENCOUNTER — Telehealth: Payer: Self-pay

## 2024-06-19 MED ORDER — GABAPENTIN 300 MG PO CAPS: 300.0000 mg | ORAL_CAPSULE | Freq: Every evening | ORAL | 1 refills | Status: AC | PRN

## 2024-06-19 NOTE — Telephone Encounter (Signed)
 Reason for CRM: gabapentin  (NEURONTIN ) 100 MG capsule is on her list.. But she is asking if she can get script for 300 mg.. she says she has been taking 3 of the 100 - as needed.  Does she need to be seen for the 300 mg?  Pls call her or refill?

## 2024-06-23 ENCOUNTER — Ambulatory Visit

## 2024-06-23 VITALS — Ht 62.0 in | Wt 175.0 lb

## 2024-06-23 DIAGNOSIS — Z0000 Encounter for general adult medical examination without abnormal findings: Secondary | ICD-10-CM | POA: Diagnosis not present

## 2024-06-23 DIAGNOSIS — Z1231 Encounter for screening mammogram for malignant neoplasm of breast: Secondary | ICD-10-CM

## 2024-06-23 NOTE — Progress Notes (Signed)
 Subjective:   [name] is a 68 y.o. who presents for a Medicare Wellness preventive visit.  As a reminder, Annual Wellness Visits don't include a physical exam, and some assessments may be limited, especially if this visit is performed virtually. We may recommend an in-person follow-up visit with your provider if needed.  Visit Complete: Virtual I connected with  [name] on 06/23/24 by a audio enabled telemedicine application and verified that I am speaking with the correct person using two identifiers.  Patient Location: Home  Provider Location: Home Office  I discussed the limitations of evaluation and management by telemedicine. The patient expressed understanding and agreed to proceed.  Vital Signs: Because this visit was a virtual/telehealth visit, some criteria may be missing or patient reported. Any vitals not documented were not able to be obtained and vitals that have been documented are patient reported.  VideoDeclined- This patient declined Librarian, academic. Therefore the visit was completed with audio only.  Persons Participating in Visit: Patient.  AWV Questionnaire: No: Patient Medicare AWV questionnaire was not completed prior to this visit.  Cardiac Risk Factors include: advanced age (>2men, >45 women);hypertension;dyslipidemia     Objective:    Today's Vitals   06/23/24 0804  Weight: 175 lb (79.4 kg)  Height: 5' 2 (1.575 m)   Body mass index is 32.01 kg/m.     06/23/2024    8:10 AM 02/25/2022    4:56 PM 03/11/2020    8:28 AM 12/05/2014   11:55 AM 11/23/2014    4:26 PM 06/16/2012   11:49 AM 06/14/2012    3:04 PM  Advanced Directives  Does Patient Have a Medical Advance Directive? No No No No  No  Patient does not have advance directive  Patient does not have advance directive   Would patient like information on creating a medical advance directive? Yes (MAU/Ambulatory/Procedural Areas - Information given) No -  Patient declined  No - patient declined information  No - patient declined information     Pre-existing out of facility DNR order (yellow form or pink MOST form)      No       Data saved with a previous flowsheet row definition    Current Medications (verified) Outpatient Encounter Medications as of 06/23/2024  Medication Sig   budesonide -formoterol  (SYMBICORT ) 80-4.5 MCG/ACT inhaler 2 puff every 12 hours   buPROPion  (WELLBUTRIN  XL) 300 MG 24 hr tablet Take 1 tablet (300 mg total) by mouth daily.   clobetasol  cream (TEMOVATE ) 0.05 % Apply 1 Application topically 2 (two) times daily.   estradiol  (ESTRACE  VAGINAL) 0.1 MG/GM vaginal cream Place 1 Applicatorful vaginally at bedtime. For 2 weeks, then 3 times per week. May also be applied directly to vulvar and vestibular tissue as well.   famotidine  (PEPCID ) 20 MG tablet One after supper   fluticasone  (FLONASE ) 50 MCG/ACT nasal spray Place 2 sprays into both nostrils daily.   fluticasone -salmeterol (ADVAIR HFA) 45-21 MCG/ACT inhaler Inhale 2 puffs into the lungs 2 (two) times daily. take 2 puffs first thing in am and then another 2 puffs about 12 hours later.   gabapentin  (NEURONTIN ) 300 MG capsule Take 1 capsule (300 mg total) by mouth at bedtime as needed (back pain).   levothyroxine  (SYNTHROID ) 137 MCG tablet TAKE (1) TABLET BY MOUTH ONCE DAILY BEFORE BREAKFAST.   losartan  (COZAAR ) 100 MG tablet TAKE (1) TABLET BY MOUTH ONCE DAILY.   meclizine  (ANTIVERT ) 25 MG tablet Take 1 tablet (25 mg  total) by mouth 3 (three) times daily as needed for dizziness.   montelukast  (SINGULAIR ) 10 MG tablet Take 1 tablet (10 mg total) by mouth at bedtime.   nystatin  powder Apply 1 Application topically 3 (three) times daily.   ondansetron  (ZOFRAN -ODT) 4 MG disintegrating tablet Take 1 tablet (4 mg total) by mouth every 8 (eight) hours as needed for nausea or vomiting.   polyethylene glycol powder (GLYCOLAX /MIRALAX ) 17 GM/SCOOP powder Take 17 g by mouth daily.    Prucalopride Succinate  (MOTEGRITY ) 2 MG TABS Take 1 tablet (2 mg total) by mouth daily.   rosuvastatin  (CRESTOR ) 20 MG tablet TAKE ONE TABLET BY MOUTH ONCE DAILY.   saline (AYR) GEL Place 1 application into both nostrils daily as needed (dryness).   sertraline  (ZOLOFT ) 100 MG tablet Take 1 tablet (100 mg total) by mouth daily.   Spacer/Aero-Holding Chambers DEVI 1 Device by Does not apply route as directed.   triamcinolone  0.025%-Cerave equivalent 1:1 cream mixture Apply topically 2 (two) times daily as needed.   valACYclovir  (VALTREX ) 1000 MG tablet Take 2 tabs po now then repeat dose in 12 hours for fever blisters   No facility-administered encounter medications on file as of 06/23/2024.    Allergies (verified) Penicillins   History: Past Medical History:  Diagnosis Date   Anxiety    Arthritis    Depression    GERD (gastroesophageal reflux disease)    Hypertension    Hypothyroid    Hypothyroidism    Moderate persistent asthma 01/20/2021   PONV (postoperative nausea and vomiting)    Recurrent upper respiratory infection (URI)    Urticaria    Past Surgical History:  Procedure Laterality Date   BACK SURGERY     BIOPSY  06/03/2020   Procedure: BIOPSY;  Surgeon: Golda Claudis PENNER, MD;  [facility];  Service: Endoscopy;;   CARPAL TUNNEL RELEASE  06/16/2012   Procedure: CARPAL TUNNEL RELEASE;  Surgeon: Elsie Mussel, MD;  Location: [facility];  Service: Orthopedics;  Laterality: Right;  limited open carpal tunnel release   CERVICAL FUSION  2018   CHOLECYSTECTOMY     COLONOSCOPY     COLONOSCOPY WITH PROPOFOL  N/A 06/03/2020   Procedure: COLONOSCOPY WITH PROPOFOL ;  Surgeon: Golda Claudis PENNER, MD;  [facility]: [facility];  Service: Endoscopy;  Laterality: N/A;  115   ESOPHAGOGASTRODUODENOSCOPY (EGD) WITH PROPOFOL  N/A 06/03/2020   Procedure: ESOPHAGOGASTRODUODENOSCOPY (EGD) WITH PROPOFOL ;  Surgeon: Golda Claudis PENNER, MD;  Location: [facility];  Service:  Endoscopy;  Laterality: N/A;   TONSILLECTOMY     TRIGGER FINGER RELEASE  06/16/2012   Procedure: RELEASE TRIGGER FINGER/A-1 PULLEY;  Surgeon: Elsie Mussel, MD;  Location: [facility];  Service: Orthopedics;  Laterality: Right;  right middle a-1 release   TUBAL LIGATION     UPPER GASTROINTESTINAL ENDOSCOPY     VAGINAL HYSTERECTOMY     Family History  Problem Relation Age of Onset   COPD Mother    Osteoporosis Mother    Immunodeficiency Mother    COPD Father    Emphysema Father    Tuberculosis Father    Lung disease Father    COPD Sister    Healthy Daughter    Healthy Son    Arthritis Other    Lung disease Other    Cancer Other    Asthma Other    Eczema Neg Hx    Atopy Neg Hx    Angioedema Neg Hx    Allergic rhinitis Neg Hx  Social History   Socioeconomic History   Marital status: Married    Spouse name: Not on file   Number of children: Not on file   Years of education: 12   Highest education level: 12th grade  Occupational History   Not on file  Tobacco Use   Smoking status: Former    Current packs/day: 0.00    Average packs/day: 1.5 packs/day for 27.0 years (40.5 ttl pk-yrs)    Types: Cigarettes    Start date: 82    Quit date: 2000    Years since quitting: 25.5   Smokeless tobacco: Never  Vaping Use   Vaping status: Never Used  Substance and Sexual Activity   Alcohol use: No   Drug use: No   Sexual activity: Not on file  Other Topics Concern   Not on file  Social History Narrative   Not on file   Social Drivers of Health   Financial Resource Strain: Low Risk  (06/23/2024)   Overall Financial Resource Strain (CARDIA)    Difficulty of Paying Living Expenses: Not hard at all  Food Insecurity: No Food Insecurity (06/23/2024)   Hunger Vital Sign    Worried About Running Out of Food in the Last Year: Never true    Ran Out of Food in the Last Year: Never true  Transportation Needs: No Transportation Needs (06/23/2024)   PRAPARE -  Administrator, Civil Service (Medical): No    Lack of Transportation (Non-Medical): No  Physical Activity: Insufficiently Active (06/23/2024)   Exercise Vital Sign    Days of Exercise per Week: 3 days    Minutes of Exercise per Session: 30 min  Stress: No Stress Concern Present (06/23/2024)   [employer] of Occupational Health - Occupational Stress Questionnaire    Feeling of Stress: Not at all  Social Connections: Socially Integrated (06/23/2024)   Social Connection and Isolation Panel    Frequency of Communication with Friends and Family: Twice a week    Frequency of Social Gatherings with Friends and Family: Once a week    Attends Religious Services: 1 to 4 times per year    Active Member of [employer] or Organizations: Yes    Attends Banker Meetings: 1 to 4 times per year    Marital Status: Married    Tobacco Counseling Counseling given: Not Answered    Clinical Intake:  Pre-visit preparation completed: Yes  Pain : No/denies pain     Diabetes: No  Lab Results  Component Value Date   HGBA1C 5.8 (H) 06/16/2021   HGBA1C 5.6 04/10/2019   HGBA1C 5.8 (H) 12/03/2017     How often do you need to have someone help you when you read instructions, pamphlets, or other written materials from your doctor or pharmacy?: 1 - Never  Interpreter Needed?: No  Information entered by :: Charmaine Bloodgood LPN   Activities of Daily Living     06/23/2024    8:08 AM  In your present state of health, do you have any difficulty performing the following activities:  Hearing? 0  Vision? 0  Difficulty concentrating or making decisions? 0  Walking or climbing stairs? 0  Dressing or bathing? 0  Doing errands, shopping? 0  Preparing Food and eating ? N  Using the Toilet? N  In the past six months, have you accidently leaked urine? N  Do you have problems with loss of bowel control? N  Managing your Medications? N  Managing your Finances? N  Housekeeping or  managing your Housekeeping? N    Patient Care Team: Cook, Jayce G, DO as PCP - General (Family Medicine[facility], [facility]   Eure, Vonn DEL, MD as Consulting Physician (Obstetrics and Gynecology) Donzella Moats, AUD (Audiology) Mariette Mitzie CROME, NP as Nurse Practitioner (Gastroenterology)  I have updated your Care Teams any recent Medical Services you may have received from other providers in the past year.     Assessment:   This is a routine wellness examination for [facility].  Hearing/Vision screen Hearing Screening - Comments:: Some hearing difficulties; has hearing aids but doesn't wear them  Vision Screening - Comments:: Wears rx glasses - up to date with routine eye exams with [facility].    Goals Addressed             This Visit's Progress    Maintain health and independence   On track      Depression Screen     06/23/2024    8:15 AM 06/05/2024    4:02 PM 05/02/2024    3:05 PM 12/20/2023    4:33 PM 09/09/2023   11:31 AM 07/01/2023   10:44 AM 05/28/2023    2:19 PM  PHQ 2/9 Scores  PHQ - 2 Score 2 2 4 1 4 2  0  PHQ- 9 Score 12 12 16 13 11 8 4     Fall Risk     06/23/2024    8:08 AM 06/05/2024    4:02 PM 05/02/2024    3:05 PM 05/28/2023    2:19 PM 05/14/2022   11:11 AM  Fall Risk   Falls in the past year? 0 0 0 0 0  Number falls in past yr: 0   0 0  Injury with Fall? 0   0 0  Risk for fall due to : No Fall Risks    No Fall Risks  Follow up Falls prevention discussed;Education provided;Falls evaluation completed    Falls evaluation completed      Data saved with a previous flowsheet row definition    MEDICARE RISK AT HOME:  Medicare Risk at Home Any stairs in or around the home?: No If so, are there any without handrails?: No Home free of loose throw rugs in walkways, pet beds, electrical cords, etc?: Yes Adequate lighting in your home to reduce risk of falls?: Yes Life alert?: No Use of a cane, walker or w/c?: No Grab bars in the bathroom?:  Yes Shower chair or bench in shower?: No Elevated toilet seat or a handicapped toilet?: Yes  TIMED UP AND GO:  Was the test performed?  No  Cognitive Function: Declined/Normal: No cognitive concerns noted by patient or family. Patient alert, oriented, able to answer questions appropriately and recall recent events. No signs of memory loss or confusion.        Immunizations Immunization History  Administered Date(s) Administered   Fluad Quad(high Dose 65+) 10/03/2022   Fluad Trivalent(High Dose 65+) 09/09/2023   Influenza,inj,Quad PF,6+ Mos 09/21/2014, 11/22/2015, 09/25/2019, 10/02/2020   Influenza-Unspecified 12/08/2013, 09/21/2019   Moderna SARS-COV2 Booster Vaccination 11/07/2020   Moderna Sars-Covid-2 Vaccination 03/07/2020, 04/09/2020   PNEUMOCOCCAL CONJUGATE-20 11/02/2022    Screening Tests Health Maintenance  Topic Date Due   COVID-19 Vaccine (4 - 2024-25 season) 08/08/2023   Zoster Vaccines- Shingrix (1 of 2) 08/03/2024 (Originally 11/17/2006)   DTaP/Tdap/Td (1 - Tdap) 05/03/2025 (Originally 11/18/1975)   INFLUENZA VACCINE  07/07/2024   Medicare Annual Wellness (AWV)  06/23/2025   MAMMOGRAM  08/22/2025  Colonoscopy  06/03/2030   Pneumococcal Vaccine: 50+ Years  Completed   DEXA SCAN  Completed   Hepatitis B Vaccines  Aged Out   HPV VACCINES  Aged Out   Meningococcal B Vaccine  Aged Out   Hepatitis C Screening  Discontinued    Health Maintenance  Health Maintenance Due  Topic Date Due   COVID-19 Vaccine (4 - 2024-25 season) 08/08/2023   Health Maintenance Items Addressed: Information provided on shingrix vaccine; mammogram ordered   Additional Screening:  Vision Screening: Recommended annual ophthalmology exams for early detection of glaucoma and other disorders of the eye. Would you like a referral to an eye doctor? No    Dental Screening: Recommended annual dental exams for proper oral hygiene  Community Resource Referral / Chronic Care  Management: CRR required this visit?  No   CCM required this visit?  No   Plan:    I have personally reviewed and noted the following in the patient's chart:   Medical and social history Use of alcohol, tobacco or illicit drugs  Current medications and supplements including opioid prescriptions. Patient is not currently taking opioid prescriptions. Functional ability and status Nutritional status Physical activity Advanced directives List of other physicians Hospitalizations, surgeries, and ER visits in previous 12 months Vitals Screenings to include cognitive, depression, and falls Referrals and appointments  In addition, I have reviewed and discussed with patient certain preventive protocols, quality metrics, and best practice recommendations. A written personalized care plan for preventive services as well as general preventive health recommendations were provided to patient.   Lavelle Pfeiffer [place], [place]   2/81/7974   After Visit Summary: (MyChart) Due to this being a telephonic visit, the after visit summary with patients personalized plan was offered to patient via MyChart   Notes: Nothing significant to report at this time.

## 2024-06-23 NOTE — Patient Instructions (Signed)
 [name] , Thank you for taking time out of your busy schedule to complete your Annual Wellness Visit with me. I enjoyed our conversation and look forward to speaking with you again next year. I, as well as your care team,  appreciate your ongoing commitment to your health goals. Please review the following plan we discussed and let me know if I can assist you in the future. Your Game plan/ To Do List    Referrals:  You have an order for:  []   2D Mammogram  [x]   3D Mammogram  []   Bone Density     Please call for appointment:   [facility] at [facility] [address] [place], [place] [address] [phone]  Make sure to wear two-piece clothing.  No lotions, powders, or deodorants the day of the appointment. Make sure to bring picture ID and insurance card.  Bring list of medications you are currently taking including any supplements.   Follow up Visits: Next Medicare AWV with our clinical staff: In 1 year    Have you seen your provider in the last 6 months (3 months if uncontrolled diabetes)? Yes Next Office Visit with your provider: To be scheduled   Clinician Recommendations:  Aim for 30 minutes of exercise or brisk walking, 6-8 glasses of water, and 5 servings of fruits and vegetables each day.       This is a list of the screening recommended for you and due dates:  Health Maintenance  Topic Date Due   COVID-19 Vaccine (4 - 2024-25 season) 08/08/2023   Zoster (Shingles) Vaccine (1 of 2) 08/03/2024*   DTaP/Tdap/Td vaccine (1 - Tdap) 05/03/2025*   Flu Shot  07/07/2024   Medicare Annual Wellness Visit  06/23/2025   Mammogram  08/22/2025   Colon Cancer Screening  06/03/2030   Pneumococcal Vaccine for age over 13  Completed   DEXA scan (bone density measurement)  Completed   Hepatitis B Vaccine  Aged Out   HPV Vaccine  Aged Out   Meningitis B Vaccine  Aged Out   Hepatitis C Screening  Discontinued  *Topic was postponed. The date shown is not the  original due date.    Advanced directives: (ACP Link)Information on Advanced Care Planning can be found at   Secretary of [facility] Advance Health Care Directives Advance Health Care Directives. [email or web address]   Advance Care Planning is important because it:  [x]  Makes sure you receive the medical care that is consistent with your values, goals, and preferences  [x]  It provides guidance to your family and loved ones and reduces their decisional burden about whether or not they are making the right decisions based on your wishes.  Follow the link provided in your after visit summary or read over the paperwork we have mailed to you to help you started getting your Advance Directives in place. If you need assistance in completing these, please reach out to us  so that we can help you!  See attachments for Preventive Care and Fall Prevention Tips.

## 2024-06-27 ENCOUNTER — Encounter: Payer: Self-pay | Admitting: Emergency Medicine

## 2024-06-27 ENCOUNTER — Other Ambulatory Visit: Payer: Self-pay

## 2024-06-27 ENCOUNTER — Ambulatory Visit: Payer: Self-pay

## 2024-06-27 ENCOUNTER — Ambulatory Visit
Admission: EM | Admit: 2024-06-27 | Discharge: 2024-06-27 | Disposition: A | Discharge status: Home / Self Care | Attending: Family Medicine | Admitting: Family Medicine

## 2024-06-27 DIAGNOSIS — H65191 Other acute nonsuppurative otitis media, right ear: Secondary | ICD-10-CM

## 2024-06-27 DIAGNOSIS — R040 Epistaxis: Secondary | ICD-10-CM

## 2024-06-27 DIAGNOSIS — J0100 Acute maxillary sinusitis, unspecified: Secondary | ICD-10-CM | POA: Diagnosis not present

## 2024-06-27 MED ORDER — CETIRIZINE HCL 10 MG PO TABS: 10.0000 mg | ORAL_TABLET | Freq: Every day | ORAL | 2 refills | Status: AC

## 2024-06-27 MED ORDER — AZELASTINE HCL 0.1 % NA SOLN: 1.0000 | Freq: Two times a day (BID) | NASAL | 2 refills | Status: AC

## 2024-06-27 MED ORDER — AZITHROMYCIN 250 MG PO TABS: ORAL_TABLET | ORAL | 0 refills | Status: DC

## 2024-06-27 NOTE — ED Provider Notes (Signed)
 [facility]    CSN: [id number] Arrival date & time: 06/27/24  1219      History   Chief Complaint Chief Complaint  Patient presents with   Headache    HPI [name] is a 68 y.o. female.   Patient presenting today with about 3 weeks of intermittent episodes of epistaxis on the right, bilateral ear pain worse on the right, sinus pain and congestion on the right and right-sided headache that for started last night.  Has been having some vertigo episodes off and on additionally.  Denies fever, chills, chest pain, nausea, vomiting, mental status changes.  States history of chronic sinusitis, allergic rhinitis on Singulair , not currently on antihistamines or nasal sprays.    Past Medical History:  Diagnosis Date   Anxiety    Arthritis    Depression    GERD (gastroesophageal reflux disease)    Hypertension    Hypothyroid    Hypothyroidism    Moderate persistent asthma 01/20/2021   PONV (postoperative nausea and vomiting)    Recurrent upper respiratory infection (URI)    Urticaria     Patient Active Problem List   Diagnosis Date Noted   Vertigo 06/05/2024   Chronic asthma, mild intermittent, uncomplicated 07/29/2023   Insomnia 03/23/2023   Obesity (BMI 30-39.9) 01/15/2023   Ganglion cyst 11/04/2022   Primary osteoarthritis involving multiple joints 05/15/2022   CAD (coronary artery disease) 03/04/2022   Anxiety and depression 03/04/2022   Healthcare maintenance 09/18/2021   Irritable bowel syndrome with constipation 07/31/2021   Prediabetes 12/13/2017   Cervical spondylolysis 07/28/2017   Lumbar spondylosis 07/28/2017   Hyperlipidemia 12/04/2016   Acne rosacea 03/07/2014   Gastroesophageal reflux disease 09/19/2012   Small intestinal bacterial overgrowth (SIBO) 07/25/2012   Essential hypertension 07/25/2012   Hypothyroidism 07/25/2012    Past Surgical History:  Procedure Laterality Date   BACK SURGERY     BIOPSY  06/03/2020   Procedure: BIOPSY;   Surgeon: Golda Claudis PENNER, MD;  Location: [facility];  Service: Endoscopy;;   CARPAL TUNNEL RELEASE  06/16/2012   Procedure: CARPAL TUNNEL RELEASE;  Surgeon: Elsie Mussel, MD;  Location: [facility];  Service: Orthopedics;  Laterality: Right;  limited open carpal tunnel release   CERVICAL FUSION  2018   CHOLECYSTECTOMY     COLONOSCOPY     COLONOSCOPY WITH PROPOFOL  N/A 06/03/2020   Procedure: COLONOSCOPY WITH PROPOFOL ;  Surgeon: Golda Claudis PENNER, MD;  Location: [facility];  Service: Endoscopy;  Laterality: N/A;  115   ESOPHAGOGASTRODUODENOSCOPY (EGD) WITH PROPOFOL  N/A 06/03/2020   Procedure: ESOPHAGOGASTRODUODENOSCOPY (EGD) WITH PROPOFOL ;  Surgeon: Golda Claudis PENNER, MD;  Location: [facility];  Service: Endoscopy;  Laterality: N/A;   TONSILLECTOMY     TRIGGER FINGER RELEASE  06/16/2012   Procedure: RELEASE TRIGGER FINGER/A-1 PULLEY;  Surgeon: Elsie Mussel, MD;  Location: [facility];  Service: Orthopedics;  Laterality: Right;  right middle a-1 release   TUBAL LIGATION     UPPER GASTROINTESTINAL ENDOSCOPY     VAGINAL HYSTERECTOMY      OB History   No obstetric history on file.      Home Medications    Prior to Admission medications   Medication Sig Start Date End Date Taking? Authorizing Provider  azelastine  (ASTELIN ) 0.1 % nasal spray Place 1 spray into both nostrils 2 (two) times daily. Use in each nostril as directed 06/27/24  Yes Stuart Vernell Norris, PA-C  azithromycin  (ZITHROMAX ) 250 MG tablet Take first  2 tablets together, then 1 every day until finished. 06/27/24  Yes Stuart Vernell Norris, PA-C  cetirizine  (ZYRTEC  ALLERGY) 10 MG tablet Take 1 tablet (10 mg total) by mouth daily. 06/27/24  Yes Stuart Vernell Norris, PA-C  budesonide -formoterol  (SYMBICORT ) 80-4.5 MCG/ACT inhaler 2 puff every 12 hours 08/26/23   Darlean Ozell NOVAK, MD  buPROPion  (WELLBUTRIN  XL) 300 MG 24 hr tablet Take 1 tablet (300 mg total) by mouth daily. 05/03/24   Hoskins,  [name] C, NP  clobetasol  cream (TEMOVATE ) 0.05 % Apply 1 Application topically 2 (two) times daily. 02/04/24   Jayne Vonn DEL, MD  estradiol  (ESTRACE  VAGINAL) 0.1 MG/GM vaginal cream Place 1 Applicatorful vaginally at bedtime. For 2 weeks, then 3 times per week. May also be applied directly to vulvar and vestibular tissue as well. 12/20/23   Cook, Jayce G, DO  famotidine  (PEPCID ) 20 MG tablet One after supper 10/20/23   Darlean Ozell NOVAK, MD  fluticasone  (FLONASE ) 50 MCG/ACT nasal spray Place 2 sprays into both nostrils daily. 06/21/23   Tobie Arleta SQUIBB, MD  fluticasone -salmeterol (ADVAIR HFA) 45-21 MCG/ACT inhaler Inhale 2 puffs into the lungs 2 (two) times daily. take 2 puffs first thing in am and then another 2 puffs about 12 hours later. 09/02/23   Darlean Ozell NOVAK, MD  gabapentin  (NEURONTIN ) 300 MG capsule Take 1 capsule (300 mg total) by mouth at bedtime as needed (back pain). 06/19/24   Cook, Jayce G, DO  levothyroxine  (SYNTHROID ) 137 MCG tablet TAKE (1) TABLET BY MOUTH ONCE DAILY BEFORE BREAKFAST. 04/24/24   Cook, Jayce G, DO  losartan  (COZAAR ) 100 MG tablet TAKE (1) TABLET BY MOUTH ONCE DAILY. 04/27/24   Cook, Jayce G, DO  meclizine  (ANTIVERT ) 25 MG tablet Take 1 tablet (25 mg total) by mouth 3 (three) times daily as needed for dizziness. 06/05/24   Cook, Jayce G, DO  montelukast  (SINGULAIR ) 10 MG tablet Take 1 tablet (10 mg total) by mouth at bedtime. 06/21/23   Tobie Arleta SQUIBB, MD  nystatin  powder Apply 1 Application topically 3 (three) times daily.    [provider]  ondansetron  (ZOFRAN -ODT) 4 MG disintegrating tablet Take 1 tablet (4 mg total) by mouth every 8 (eight) hours as needed for nausea or vomiting. 06/05/24   Cook, Jayce G, DO  polyethylene glycol powder (GLYCOLAX /MIRALAX ) 17 GM/SCOOP powder Take 17 g by mouth daily. 06/03/20   Rehman, Claudis PENNER, MD  Prucalopride Succinate  (MOTEGRITY ) 2 MG TABS Take 1 tablet (2 mg total) by mouth daily. 04/13/24   Carlan, Chelsea L, NP  rosuvastatin   (CRESTOR ) 20 MG tablet TAKE ONE TABLET BY MOUTH ONCE DAILY. 04/27/24   Cook, Jayce G, DO  saline (AYR) GEL Place 1 application into both nostrils daily as needed (dryness).    [provider]  sertraline  (ZOLOFT ) 100 MG tablet Take 1 tablet (100 mg total) by mouth daily. 12/20/23   Cook, Jayce G, DO  Spacer/Aero-Holding Chambers DEVI 1 Device by Does not apply route as directed. 07/22/23   Iva Marty Saltness, MD  triamcinolone  0.025%-Cerave equivalent 1:1 cream mixture Apply topically 2 (two) times daily as needed. 09/28/23   Leath-Warren, Etta PARAS, NP  valACYclovir  (VALTREX ) 1000 MG tablet Take 2 tabs po now then repeat dose in 12 hours for fever blisters 04/25/24   Cook, Jayce G, DO    Family History Family History  Problem Relation Age of Onset   COPD Mother    Osteoporosis Mother    Immunodeficiency Mother    COPD Father  Emphysema Father    Tuberculosis Father    Lung disease Father    COPD Sister    Healthy Daughter    Healthy Son    Arthritis Other    Lung disease Other    Cancer Other    Asthma Other    Eczema Neg Hx    Atopy Neg Hx    Angioedema Neg Hx    Allergic rhinitis Neg Hx     Social History Social History   Tobacco Use   Smoking status: Former    Current packs/day: 0.00    Average packs/day: 1.5 packs/day for 27.0 years (40.5 ttl pk-yrs)    Types: Cigarettes    Start date: 31    Quit date: 2000    Years since quitting: 25.5   Smokeless tobacco: Never  Vaping Use   Vaping status: Never Used  Substance Use Topics   Alcohol use: No   Drug use: No     Allergies   Penicillins   Review of Systems Review of Systems Per HPI  Physical Exam Triage Vital Signs ED Triage Vitals  Encounter Vitals Group     BP 06/27/24 1237 (!) 151/78     Girls Systolic BP Percentile --      Girls Diastolic BP Percentile --      Boys Systolic BP Percentile --      Boys Diastolic BP Percentile --      Pulse Rate 06/27/24 1237 74     Resp 06/27/24  1237 20     Temp 06/27/24 1237 97.8 F (36.6 C)     Temp Source 06/27/24 1237 Oral     SpO2 06/27/24 1237 93 %     Weight --      Height --      Head Circumference --      Peak Flow --      Pain Score 06/27/24 1235 7     Pain Loc --      Pain Education --      Exclude from Growth Chart --    No data found.  Updated Vital Signs BP (!) 151/78 (BP Location: Right Arm)   Pulse 74   Temp 97.8 F (36.6 C) (Oral)   Resp 20   SpO2 93%   Visual Acuity Right Eye Distance:   Left Eye Distance:   Bilateral Distance:    Right Eye Near:   Left Eye Near:    Bilateral Near:     Physical Exam Vitals and nursing note reviewed.  Constitutional:      Appearance: Normal appearance.  HENT:     Head: Atraumatic.     Right Ear: External ear normal.     Left Ear: Tympanic membrane and external ear normal.     Ears:     Comments: Right middle ear effusion    Nose: Congestion present.     Comments: No appreciable polyps, sores or lesions to the right nostril    Mouth/Throat:     Mouth: Mucous membranes are moist.     Pharynx: Posterior oropharyngeal erythema present.  Eyes:     Extraocular Movements: Extraocular movements intact.     Conjunctiva/sclera: Conjunctivae normal.  Cardiovascular:     Rate and Rhythm: Normal rate and regular rhythm.     Heart sounds: Normal heart sounds.  Pulmonary:     Effort: Pulmonary effort is normal.     Breath sounds: Normal breath sounds. No wheezing.  Musculoskeletal:  General: Normal range of motion.     Cervical back: Normal range of motion and neck supple.  Skin:    General: Skin is warm and dry.  Neurological:     Mental Status: She is alert and oriented to person, place, and time.  Psychiatric:        Mood and Affect: Mood normal.        Thought Content: Thought content normal.      UC Treatments / Results  Labs (all labs ordered are listed, but only abnormal results are displayed) Labs Reviewed - No data to  display  EKG   Radiology No results found.  Procedures Procedures (including critical care time)  Medications Ordered in UC Medications - No data to display  Initial Impression / Assessment and Plan / UC Course  I have reviewed the triage vital signs and the nursing notes.  Pertinent labs & imaging results that were available during my care of the patient were reviewed by me and considered in my medical decision making (see chart for details).     Given duration and worsening course, treat with Zithromax  for sinusitis and start Zyrtec  and Astelin  in addition to Singulair  for allergy control.  Discussed nasal saline, humidifiers, Vaseline to the nares to help with the epistaxis that I suspect to be secondary to ongoing inflammation.  Return for worsening symptoms.  Final Clinical Impressions(s) / UC Diagnoses   Final diagnoses:  Acute non-recurrent maxillary sinusitis  Epistaxis  Acute MEE (middle ear effusion), right   Discharge Instructions   None    ED Prescriptions     Medication Sig Dispense Auth. Provider   azelastine  (ASTELIN ) 0.1 % nasal spray Place 1 spray into both nostrils 2 (two) times daily. Use in each nostril as directed 30 mL Stuart Vernell Norris, PA-C   azithromycin  (ZITHROMAX ) 250 MG tablet Take first 2 tablets together, then 1 every day until finished. 6 tablet Stuart Vernell Norris, PA-C   cetirizine  (ZYRTEC  ALLERGY) 10 MG tablet Take 1 tablet (10 mg total) by mouth daily. 30 tablet Stuart Vernell Norris, [place]      PDMP not reviewed this encounter.   Stuart Vernell Norris, PA-C 06/27/24 1423

## 2024-06-27 NOTE — ED Triage Notes (Addendum)
 Pt reports intermittent epistaxis x3 weeks, bilateral ear discomfort for last several days, right sided headache x2-3 days, cough since last night. Pt reports history of vertigo. Was told by pcp to come to ED or UC today but was able to schedule appt with pcp tomorrow.

## 2024-06-27 NOTE — Telephone Encounter (Signed)
 FYI Only or Action Required?: FYI only for provider.  Patient was last seen in primary care on 06/05/2024 by Cook, Jayce G, DO.  Called Nurse Triage reporting Epistaxis.  Symptoms began several weeks ago.  Interventions attempted: Ice/heat application.  Symptoms are: gradually worsening. Now experiencing dizziness, BP 172/98  Triage Disposition: Go to ED Now (Notify PCP)- initially refused, prefers to see PCP but is willing to go to UC instead.   Patient/caregiver understands and will follow disposition?: Yes, but will waitCopied from CRM [id number]. Topic: Clinical - Red Word Triage >> Jun 27, 2024  9:56 AM Avram MATSU wrote: Red Word that prompted transfer to Nurse Triage: bad nose bleed/dizziness    ----------------------------------------------------------------------- From previous Reason for Contact - Scheduling: Patient/patient representative is calling to schedule an appointment. Refer to attachments for appointment information. Reason for Disposition  Feeling weak or lightheaded (e.g., woozy, feeling like they might faint)  Answer Assessment - Initial Assessment Questions 1. AMOUNT OF BLEEDING: How bad is the bleeding? How much blood was lost? Has the bleeding stopped?     Has stopped   2. ONSET: When did the nosebleed start?      Off/on x 2 weeks  3. FREQUENCY: How many nosebleeds have you had in the last 24 hours?      2  4. RECURRENT SYMPTOMS: Have there been other recent nosebleeds? If Yes, ask: How long did it take you to stop the bleeding? What worked best?      Put a wet paper towel in nose to absorb, took 10 minutes to stop  5. CAUSE: What do you think caused this nosebleed?     Unsure of what caused this  6. LOCAL FACTORS: Do you have any cold symptoms?, Have you been rubbing or picking at your nose?     No, but Picked nose and pulled out what looked like a finger nail while it was bleeding  7. SYSTEMIC FACTORS: Do you have high blood  pressure or any bleeding problems?     Yes has history of high blood pressure, takes Losartan   8. BLOOD THINNERS: Do you take any blood thinners? (e.g., aspirin, clopidogrel / Plavix, coumadin, heparin). Notes: Other strong blood thinners include: Arixtra (fondaparinux), Eliquis (apixaban), Pradaxa (dabigatran), and Xarelto (rivaroxaban).     No  9. OTHER SYMPTOMS: Do you have any other symptoms? (e.g., lightheadedness)     Dizziness, but has history of vertigo  10. PREGNANCY: Is there any chance you are pregnant? When was your last menstrual period?       no  Protocols used: Nosebleed-A-AH

## 2024-06-28 ENCOUNTER — Ambulatory Visit: Admitting: Physician Assistant

## 2024-07-17 ENCOUNTER — Ambulatory Visit (INDEPENDENT_AMBULATORY_CARE_PROVIDER_SITE_OTHER): Admitting: Gastroenterology

## 2024-07-17 ENCOUNTER — Encounter (INDEPENDENT_AMBULATORY_CARE_PROVIDER_SITE_OTHER): Payer: Self-pay | Admitting: Gastroenterology

## 2024-07-17 VITALS — BP 124/79 | HR 116 | Temp 97.8°F | Ht 62.0 in | Wt 176.0 lb

## 2024-07-17 DIAGNOSIS — K581 Irritable bowel syndrome with constipation: Secondary | ICD-10-CM

## 2024-07-17 DIAGNOSIS — Z8619 Personal history of other infectious and parasitic diseases: Secondary | ICD-10-CM | POA: Diagnosis not present

## 2024-07-17 MED ORDER — LINACLOTIDE 290 MCG PO CAPS: 290.0000 ug | ORAL_CAPSULE | Freq: Every day | ORAL | 1 refills | Status: DC

## 2024-07-17 NOTE — Progress Notes (Deleted)
 Error

## 2024-07-17 NOTE — Patient Instructions (Signed)
-  Increase water intake, aim for atleast 64 oz per day Increase fruits, veggies and whole grains, kiwi and prunes are especially good for constipation -start linzess  290mcg daily -can use preparation H suppository as needed  Follow up 3 months  It was a pleasure to see you today. I want to create trusting relationships with patients and provide genuine, compassionate, and quality care. I truly value your feedback! please be on the lookout for a survey regarding your visit with me today. I appreciate your input about our visit and your time in completing this!    Mikiah Demond L. Linzy Laury, MSN, APRN, AGNP-C Adult-Gerontology Nurse Practitioner [facility]

## 2024-07-17 NOTE — Progress Notes (Signed)
 Referring Provider: Cook, Jayce G, DO Primary Care Physician:  Cook, Jayce G, DO Primary GI Physician: Dr. Eartha   Chief Complaint  Patient presents with   Follow-up    Patient here today for a follow up on IBS with constipation. Patient is still having issues with this. She is taking miralax  prn. She does not know if she is taking Motegrity  as she does not recognize the name.  She says she will use Linzess  prn.   HPI:   SKYLYN SLEZAK is a 68 y.o. female with past medical history of  IBS-C, anxiety, depression, GERD, hypertension, hypothyroidism, asthma, recurrent SIBO   Patient presenting today for:  Follow up of IBS and history of recurrent SIBO  Last seen may, at that time reported on linzess  without improvement in constipation, amitiza  and motegrity  were too expensive. Endorsed more bloating. Similar presentation in the past with SIBO flares.   Recommended to increase water, fruits, veggies, miralax  bowel cleanse, start motegrity  2mg  daily, xifaxan  course (cipro  if not covered)  Present:  States that she did course of cipro  and had some improvement for about 3 days and then started having more bloating again. She also reports she had a flare up of hemorrhoids and used a preparation H suppository which seemed to help her move her bowels very sufficiently. She has not had a BM in about 5 days. She is having maybe 1 BM per week. She is taking miralax  with 2 capfuls of miralax  PRN. She is not taking linzess , she thinks it expired and she is not sure why she did not use it. She did not get motegrity  as it was too expensive. She is not taking anything daily for her constipation. She only has dark stools if she takes pepto bismol. Appetite is good. No nausea, vomiting, rectal bleeding. Bloating improves with a good BM.   Last EGD: 06/03/2020 - Normal hypopharynx. - Normal esophagus. - Z-line irregular, 36 cm from the incisors. - Erythematous mucosa in the antrum. Biopsied. - Normal  duodenal bulb and second portion of the duodenum. Biopsied Last Colonoscopy: 06/03/2020  -  Diverticulosis in the sigmoid colon. - External hemorrhoids. - No specimens collected. Filed Weights   07/17/24 1526  Weight: 176 lb (79.8 kg)     Past Medical History:  Diagnosis Date   Anxiety    Arthritis    Depression    GERD (gastroesophageal reflux disease)    Hypertension    Hypothyroid    Hypothyroidism    Moderate persistent asthma 01/20/2021   PONV (postoperative nausea and vomiting)    Recurrent upper respiratory infection (URI)    Urticaria     Past Surgical History:  Procedure Laterality Date   BACK SURGERY     BIOPSY  06/03/2020   Procedure: BIOPSY;  Surgeon: Golda Claudis PENNER, MD;  [facility];  Service: Endoscopy;;   CARPAL TUNNEL RELEASE  06/16/2012   Procedure: CARPAL TUNNEL RELEASE;  Surgeon: Elsie Mussel, MD;  Location: [facility];  Service: Orthopedics;  Laterality: Right;  limited open carpal tunnel release   CERVICAL FUSION  2018   CHOLECYSTECTOMY     COLONOSCOPY     COLONOSCOPY WITH PROPOFOL  N/A 06/03/2020   Procedure: COLONOSCOPY WITH PROPOFOL ;  Surgeon: Golda Claudis PENNER, MD;  [facility];  Service: Endoscopy;  Laterality: N/A;  115   ESOPHAGOGASTRODUODENOSCOPY (EGD) WITH PROPOFOL  N/A 06/03/2020   Procedure: ESOPHAGOGASTRODUODENOSCOPY (EGD) WITH PROPOFOL ;  Surgeon: Golda Claudis PENNER, MD;  [facility];  Service: Endoscopy;  Laterality: N/A;   TONSILLECTOMY     TRIGGER FINGER RELEASE  06/16/2012   Procedure: RELEASE TRIGGER FINGER/A-1 PULLEY;  Surgeon: Elsie Mussel, MD;  Location: [facility];  Service: Orthopedics;  Laterality: Right;  right middle a-1 release   TUBAL LIGATION     UPPER GASTROINTESTINAL ENDOSCOPY     VAGINAL HYSTERECTOMY      Current Outpatient Medications  Medication Sig Dispense Refill   azelastine  (ASTELIN ) 0.1 % nasal spray Place 1 spray into both nostrils 2 (two) times  daily. Use in each nostril as directed 30 mL 2   budesonide -formoterol  (SYMBICORT ) 80-4.5 MCG/ACT inhaler 2 puff every 12 hours (Patient taking differently: 2 puff every 12 hours prn) 1 each 12   buPROPion  (WELLBUTRIN  XL) 300 MG 24 hr tablet Take 1 tablet (300 mg total) by mouth daily. 90 tablet 0   cetirizine  (ZYRTEC  ALLERGY) 10 MG tablet Take 1 tablet (10 mg total) by mouth daily. 30 tablet 2   clobetasol  cream (TEMOVATE ) 0.05 % Apply 1 Application topically 2 (two) times daily. 30 g 11   famotidine  (PEPCID ) 20 MG tablet One after supper 90 tablet 1   fluticasone  (FLONASE ) 50 MCG/ACT nasal spray Place 2 sprays into both nostrils daily. 16 g 5   fluticasone -salmeterol (ADVAIR HFA) 45-21 MCG/ACT inhaler Inhale 2 puffs into the lungs 2 (two) times daily. take 2 puffs first thing in am and then another 2 puffs about 12 hours later. (Patient taking differently: Inhale 2 puffs into the lungs as needed. take 2 puffs first thing in am and then another 2 puffs about 12 hours later.) 1 each 12   gabapentin  (NEURONTIN ) 300 MG capsule Take 1 capsule (300 mg total) by mouth at bedtime as needed (back pain). 90 capsule 1   levothyroxine  (SYNTHROID ) 137 MCG tablet TAKE (1) TABLET BY MOUTH ONCE DAILY BEFORE BREAKFAST. 90 tablet 1   losartan  (COZAAR ) 100 MG tablet TAKE (1) TABLET BY MOUTH ONCE DAILY. 90 tablet 3   meclizine  (ANTIVERT ) 25 MG tablet Take 1 tablet (25 mg total) by mouth 3 (three) times daily as needed for dizziness. 30 tablet 0   montelukast  (SINGULAIR ) 10 MG tablet Take 1 tablet (10 mg total) by mouth at bedtime. 30 tablet 5   ondansetron  (ZOFRAN -ODT) 4 MG disintegrating tablet Take 1 tablet (4 mg total) by mouth every 8 (eight) hours as needed for nausea or vomiting. 20 tablet 0   polyethylene glycol powder (GLYCOLAX /MIRALAX ) 17 GM/SCOOP powder Take 17 g by mouth daily. 507 g 5   rosuvastatin  (CRESTOR ) 20 MG tablet TAKE ONE TABLET BY MOUTH ONCE DAILY. 90 tablet 3   saline (AYR) GEL Place 1  application into both nostrils daily as needed (dryness).     sertraline  (ZOLOFT ) 100 MG tablet Take 1 tablet (100 mg total) by mouth daily.     Spacer/Aero-Holding Chambers DEVI 1 Device by Does not apply route as directed. 1 each 1   triamcinolone  0.025%-Cerave equivalent 1:1 cream mixture Apply topically 2 (two) times daily as needed. 454 g 0   valACYclovir  (VALTREX ) 1000 MG tablet Take 2 tabs po now then repeat dose in 12 hours for fever blisters 20 tablet 6   nystatin  powder Apply 1 Application topically 3 (three) times daily. (Patient not taking: Reported on 07/17/2024)     Prucalopride Succinate  (MOTEGRITY ) 2 MG TABS Take 1 tablet (2 mg total) by mouth daily. (Patient not taking: Reported on 07/17/2024) 30 tablet 1  No current facility-administered medications for this visit.    Allergies as of 07/17/2024 - Review Complete 07/17/2024  Allergen Reaction Noted   Penicillins Hives 04/20/2012    Social History   Socioeconomic History   Marital status: Married    Spouse name: Not on file   Number of children: Not on file   Years of education: 12   Highest education level: 12th grade  Occupational History   Not on file  Tobacco Use   Smoking status: Former    Current packs/day: 0.00    Average packs/day: 1.5 packs/day for 27.0 years (40.5 ttl pk-yrs)    Types: Cigarettes    Start date: 19    Quit date: 2000    Years since quitting: 25.6   Smokeless tobacco: Never  Vaping Use   Vaping status: Never Used  Substance and Sexual Activity   Alcohol use: No   Drug use: No   Sexual activity: Not on file  Other Topics Concern   Not on file  Social History Narrative   Not on file   Social Drivers of Health   Financial Resource Strain: Low Risk  (06/23/2024)   Overall Financial Resource Strain (CARDIA)    Difficulty of Paying Living Expenses: Not hard at all  Food Insecurity: No Food Insecurity (06/23/2024)   Hunger Vital Sign    Worried About Running Out of Food in the Last  Year: Never true    Ran Out of Food in the Last Year: Never true  Transportation Needs: No Transportation Needs (06/23/2024)   PRAPARE - Administrator, Civil Service (Medical): No    Lack of Transportation (Non-Medical): No  Physical Activity: Insufficiently Active (06/23/2024)   Exercise Vital Sign    Days of Exercise per Week: 3 days    Minutes of Exercise per Session: 30 min  Stress: No Stress Concern Present (06/23/2024)   [employer] of Occupational Health - Occupational Stress Questionnaire    Feeling of Stress: Not at all  Social Connections: Socially Integrated (06/23/2024)   Social Connection and Isolation Panel    Frequency of Communication with Friends and Family: Twice a week    Frequency of Social Gatherings with Friends and Family: Once a week    Attends Religious Services: 1 to 4 times per year    Active Member of [employer] or Organizations: Yes    Attends Banker Meetings: 1 to 4 times per year    Marital Status: Married    Review of systems General: negative for malaise, night sweats, fever, chills, weight loss Neck: Negative for lumps, goiter, pain and significant neck swelling Resp: Negative for cough, wheezing, dyspnea at rest CV: Negative for chest pain, leg swelling, palpitations, orthopnea GI: denies melena, hematochezia, nausea, vomiting, diarrhea, dysphagia, odyonophagia, early satiety or unintentional weight loss. +Constipation +bloating  MSK: Negative for joint pain or swelling, back pain, and muscle pain. Derm: Negative for itching or rash Psych: Denies depression, anxiety, memory loss, confusion. No homicidal or suicidal ideation.  Heme: Negative for prolonged bleeding, bruising easily, and swollen nodes. Endocrine: Negative for cold or heat intolerance, polyuria, polydipsia and goiter. Neuro: negative for tremor, gait imbalance, syncope and seizures. The remainder of the review of systems is noncontributory.  Physical Exam: BP  124/79 (BP Location: Left Arm, Patient Position: Sitting, Cuff Size: Large)   Pulse (!) 116   Temp 97.8 F (36.6 C) (Temporal)   Ht 5' 2 (1.575 m)   Wt 176 lb (79.8 kg)  BMI 32.19 kg/m  General:   Alert and oriented. No distress noted. Pleasant and cooperative.  Head:  Normocephalic and atraumatic. Eyes:  Conjuctiva clear without scleral icterus. Mouth:  Oral mucosa pink and moist. Good dentition. No lesions. Heart: Normal rate and rhythm, s1 and s2 heart sounds present.  Lungs: Clear lung sounds in all lobes. Respirations equal and unlabored. Abdomen:  +BS, soft, non-tender and non-distended. No rebound or guarding. No HSM or masses noted. Derm: No palmar erythema or jaundice Msk:  Symmetrical without gross deformities. Normal posture. Extremities:  Without edema. Neurologic:  Alert and  oriented x4 Psych:  Alert and cooperative. Normal mood and affect.  Invalid input(s): 6 MONTHS   ASSESSMENT: ARLETH MCCULLAR is a 68 y.o. female presenting today for follow up of IBS with constipation and history of recurrent SIBO  Patient with multi year history of constipation and initial SIBO diagnosis via breath testing back in the 90s, has been treated intermittently with courses of cipro /xifaxan  in 2012. At last visit she noted some worsening of constipation and bloating, was prescribed xifaxan  which was not covered, therefore did a course of cipro  which she felt provided some relief for a few days but constipation and bloating returned shortly thereafter. She is using miralax  PRN now, could not afford motegrity  prescribed at last visit. Notes that bloating improves if she is able to move her bowels. Not taking any thing regularly for her constipation. At this time, suspect symptoms are mostly related to her IBS and would recommend an ongoing bowel regimen. Will trial linzess  290mcg given how significant her constipation is. Encouraged her to increase water, fruits, veggies, whole grains. Could  consider anorectal manometry if constipation persists despite laxative therapies.    PLAN:  -Increase water intake, aim for atleast 64 oz per day -Increase fruits, veggies and whole grains, kiwi and prunes are especially good for constipation -start linzess  290mcg daily -can use preparation H suppository PRN -consider anorectal manometry if constipation persists  All questions were answered, patient verbalized understanding and is in agreement with plan as outlined above.    Follow Up: 3 months   Annabell Oconnor L. Dominigue Gellner, MSN, APRN, AGNP-C Adult-Gerontology Nurse Practitioner [facility]  I have reviewed the note and agree with the APP's assessment as described in this progress note  If patient persists with bloating episodes, we will also need to perform a breath test to rule out SIMO.  Toribio Fortune, MD Gastroenterology and Hepatology [facility]

## 2024-07-18 ENCOUNTER — Telehealth (INDEPENDENT_AMBULATORY_CARE_PROVIDER_SITE_OTHER): Payer: Self-pay

## 2024-07-18 ENCOUNTER — Other Ambulatory Visit (INDEPENDENT_AMBULATORY_CARE_PROVIDER_SITE_OTHER): Payer: Self-pay | Admitting: Gastroenterology

## 2024-07-18 MED ORDER — LINACLOTIDE 290 MCG PO CAPS: 290.0000 ug | ORAL_CAPSULE | Freq: Every day | ORAL | 3 refills | Status: DC

## 2024-07-18 NOTE — Telephone Encounter (Signed)
 Patient called back after appointment on 07/17/2024 asking for a 90 day supply of her Linzess . Please advise. Thanks

## 2024-07-25 ENCOUNTER — Ambulatory Visit: Admitting: Obstetrics & Gynecology

## 2024-07-27 ENCOUNTER — Ambulatory Visit: Admitting: Obstetrics & Gynecology

## 2024-08-03 ENCOUNTER — Other Ambulatory Visit: Payer: Self-pay | Admitting: Family Medicine

## 2024-08-03 DIAGNOSIS — F32A Depression, unspecified: Secondary | ICD-10-CM

## 2024-08-10 ENCOUNTER — Ambulatory Visit (INDEPENDENT_AMBULATORY_CARE_PROVIDER_SITE_OTHER): Admitting: Obstetrics & Gynecology

## 2024-08-10 ENCOUNTER — Encounter: Payer: Self-pay | Admitting: Obstetrics & Gynecology

## 2024-08-10 VITALS — BP 138/73 | HR 84 | Ht 62.0 in | Wt 175.0 lb

## 2024-08-10 DIAGNOSIS — N951 Menopausal and female climacteric states: Secondary | ICD-10-CM

## 2024-08-10 DIAGNOSIS — L900 Lichen sclerosus et atrophicus: Secondary | ICD-10-CM

## 2024-08-10 MED ORDER — VEOZAH 45 MG PO TABS: 1.0000 | ORAL_TABLET | Freq: Every day | ORAL | 11 refills | Status: AC

## 2024-08-10 NOTE — Progress Notes (Signed)
 Follow up appointment [facility] on clobetasol   Chief Complaint  Patient presents with   Follow-up    Blood pressure 138/73, pulse 84, height 5' 2 (1.575 m), weight 175 lb (79.4 kg).  No symptoms at all with the clobetasol , decrease frequency to qohs  Trial of veozah  45 mg for VMS  MEDS ordered this encounter: Meds ordered this encounter  Medications   Fezolinetant  (VEOZAH ) 45 MG TABS    Sig: Take 1 tablet (45 mg total) by mouth at bedtime.    Dispense:  30 tablet    Refill:  11    Orders for this encounter: No orders of the defined types were placed in this encounter.   Impression + Management Plan   ICD-10-CM   1. Lichen sclerosus et atrophicus: flares since 04/2023: decrease clobetasol  to every other night, ^if flares  L90.0     2. Vasomotor symptoms due to menopause: trial of veozah , unsure about insurance coverage  N95.1       Follow Up: Return in about 1 year (around 08/10/2025) for Follow up, with Dr Jayne.     All questions were answered.  Past Medical History:  Diagnosis Date   Anxiety    Arthritis    Depression    GERD (gastroesophageal reflux disease)    Hypertension    Hypothyroid    Hypothyroidism    Moderate persistent asthma 01/20/2021   PONV (postoperative nausea and vomiting)    Recurrent upper respiratory infection (URI)    Urticaria     Past Surgical History:  Procedure Laterality Date   BACK SURGERY     BIOPSY  06/03/2020   Procedure: BIOPSY;  Surgeon: Golda Claudis PENNER, MD;  [facility];  Service: Endoscopy;;   CARPAL TUNNEL RELEASE  06/16/2012   Procedure: CARPAL TUNNEL RELEASE;  Surgeon: Elsie Mussel, MD;  Location: [facility];  Service: Orthopedics;  Laterality: Right;  limited open carpal tunnel release   CERVICAL FUSION  2018   CHOLECYSTECTOMY     COLONOSCOPY     COLONOSCOPY WITH PROPOFOL  N/A 06/03/2020   Procedure: COLONOSCOPY WITH PROPOFOL ;  Surgeon: Golda Claudis PENNER, MD;  [facility];  Service:  Endoscopy;  Laterality: N/A;  115   ESOPHAGOGASTRODUODENOSCOPY (EGD) WITH PROPOFOL  N/A 06/03/2020   Procedure: ESOPHAGOGASTRODUODENOSCOPY (EGD) WITH PROPOFOL ;  Surgeon: Golda Claudis PENNER, MD;  [facility];  Service: Endoscopy;  Laterality: N/A;   TONSILLECTOMY     TRIGGER FINGER RELEASE  06/16/2012   Procedure: RELEASE TRIGGER FINGER/A-1 PULLEY;  Surgeon: Elsie Mussel, MD;  Location: [facility];  Service: Orthopedics;  Laterality: Right;  right middle a-1 release   TUBAL LIGATION     UPPER GASTROINTESTINAL ENDOSCOPY     VAGINAL HYSTERECTOMY      OB History   No obstetric history on file.     Allergies  Allergen Reactions   Penicillins Hives    Social History   Socioeconomic History   Marital status: Married    Spouse name: Not on file   Number of children: Not on file   Years of education: 12   Highest education level: 12th grade  Occupational History   Not on file  Tobacco Use   Smoking status: Former    Current packs/day: 0.00    Average packs/day: 1.5 packs/day for 27.0 years (40.5 ttl pk-yrs)    Types: Cigarettes    Start date: 32    Quit date: 2000    Years since quitting: 25.6  Smokeless tobacco: Never  Vaping Use   Vaping status: Never Used  Substance and Sexual Activity   Alcohol use: No   Drug use: No   Sexual activity: Not on file  Other Topics Concern   Not on file  Social History Narrative   Not on file   Social Drivers of Health   Financial Resource Strain: Low Risk  (06/23/2024)   Overall Financial Resource Strain (CARDIA)    Difficulty of Paying Living Expenses: Not hard at all  Food Insecurity: No Food Insecurity (06/23/2024)   Hunger Vital Sign    Worried About Running Out of Food in the Last Year: Never true    Ran Out of Food in the Last Year: Never true  Transportation Needs: No Transportation Needs (06/23/2024)   PRAPARE - Administrator, Civil Service (Medical): No    Lack of Transportation  (Non-Medical): No  Physical Activity: Insufficiently Active (06/23/2024)   Exercise Vital Sign    Days of Exercise per Week: 3 days    Minutes of Exercise per Session: 30 min  Stress: No Stress Concern Present (06/23/2024)   [employer] of Occupational Health - Occupational Stress Questionnaire    Feeling of Stress: Not at all  Social Connections: Socially Integrated (06/23/2024)   Social Connection and Isolation Panel    Frequency of Communication with Friends and Family: Twice a week    Frequency of Social Gatherings with Friends and Family: Once a week    Attends Religious Services: 1 to 4 times per year    Active Member of [employer] or Organizations: Yes    Attends Engineer, structural: 1 to 4 times per year    Marital Status: Married    Family History  Problem Relation Age of Onset   COPD Mother    Osteoporosis Mother    Immunodeficiency Mother    COPD Father    Emphysema Father    Tuberculosis Father    Lung disease Father    COPD Sister    Healthy Daughter    Healthy Son    Arthritis Other    Lung disease Other    Cancer Other    Asthma Other    Eczema Neg Hx    Atopy Neg Hx    Angioedema Neg Hx    Allergic rhinitis Neg Hx

## 2024-08-14 ENCOUNTER — Telehealth: Payer: Self-pay | Admitting: Family Medicine

## 2024-08-14 MED ORDER — BUPROPION HCL ER (XL) 300 MG PO TB24: 300.0000 mg | ORAL_TABLET | Freq: Every day | ORAL | 0 refills | Status: DC

## 2024-08-14 NOTE — Telephone Encounter (Signed)
 Buproprion refill sent to [facility].  Patient should schedule follow up.

## 2024-08-14 NOTE — Telephone Encounter (Signed)
 Refill on  buPROPion  (WELLBUTRIN  XL) 300 MG 24 hr tablet   [facility]

## 2024-08-15 ENCOUNTER — Encounter: Payer: Self-pay | Admitting: *Deleted

## 2024-08-23 ENCOUNTER — Encounter (HOSPITAL_COMMUNITY): Payer: Self-pay

## 2024-08-23 ENCOUNTER — Ambulatory Visit (HOSPITAL_COMMUNITY)
Admission: RE | Admit: 2024-08-23 | Discharge: 2024-08-23 | Disposition: A | Discharge status: Home / Self Care | Source: Ambulatory Visit | Attending: Family Medicine | Admitting: Family Medicine

## 2024-08-23 DIAGNOSIS — Z1231 Encounter for screening mammogram for malignant neoplasm of breast: Secondary | ICD-10-CM | POA: Insufficient documentation

## 2024-08-24 ENCOUNTER — Telehealth (INDEPENDENT_AMBULATORY_CARE_PROVIDER_SITE_OTHER): Payer: Self-pay | Admitting: Gastroenterology

## 2024-08-24 ENCOUNTER — Other Ambulatory Visit (INDEPENDENT_AMBULATORY_CARE_PROVIDER_SITE_OTHER): Payer: Self-pay | Admitting: Gastroenterology

## 2024-08-24 MED ORDER — CIPROFLOXACIN HCL 500 MG PO TABS: 500.0000 mg | ORAL_TABLET | Freq: Two times a day (BID) | ORAL | 0 refills | Status: AC

## 2024-08-24 NOTE — Telephone Encounter (Signed)
 Pt called in requesting refill on Cipro . Pt states she has not used the bathroom in 8 days, swollen big, and hurting. Pt states when this happens she usually has bacterial overgrowth and Cipro  had been deemed to work best for in the past. Advised pt that she may need an office visit but I would send a message to provider. Please advise. Thank you

## 2024-08-24 NOTE — Telephone Encounter (Signed)
 Pt contacted and verbalized understanding. Pt states having a SIBO test sounds wonderful. Advised pt I would let the provider know and we would get the ball rolling.

## 2024-08-25 ENCOUNTER — Encounter: Payer: Self-pay | Admitting: Nurse Practitioner

## 2024-08-25 ENCOUNTER — Telehealth: Payer: Self-pay | Admitting: Family Medicine

## 2024-08-25 NOTE — Telephone Encounter (Signed)
 Copied from CRM [id number]. Topic: Clinical - Medication Question >> Aug 25, 2024 12:35 PM Turkey B wrote: Reason for CRM: Patient called in, asking if she stops taking Wellbutrin  because she doesn't want to get it refilled

## 2024-08-25 NOTE — Telephone Encounter (Signed)
 My chart message sent

## 2024-08-28 NOTE — Telephone Encounter (Signed)
 Noted

## 2024-08-29 NOTE — Telephone Encounter (Signed)
 Referral sent, they will contact patient with apt

## 2024-09-21 ENCOUNTER — Ambulatory Visit (INDEPENDENT_AMBULATORY_CARE_PROVIDER_SITE_OTHER): Admitting: Gastroenterology

## 2024-09-21 ENCOUNTER — Other Ambulatory Visit (INDEPENDENT_AMBULATORY_CARE_PROVIDER_SITE_OTHER): Payer: Self-pay | Admitting: Gastroenterology

## 2024-09-21 ENCOUNTER — Encounter (INDEPENDENT_AMBULATORY_CARE_PROVIDER_SITE_OTHER): Payer: Self-pay | Admitting: Gastroenterology

## 2024-09-21 ENCOUNTER — Ambulatory Visit (HOSPITAL_COMMUNITY)
Admission: RE | Admit: 2024-09-21 | Discharge: 2024-09-21 | Disposition: A | Discharge status: Home / Self Care | Source: Ambulatory Visit | Attending: Gastroenterology | Admitting: Gastroenterology

## 2024-09-21 ENCOUNTER — Ambulatory Visit (INDEPENDENT_AMBULATORY_CARE_PROVIDER_SITE_OTHER): Payer: Self-pay | Admitting: Gastroenterology

## 2024-09-21 VITALS — BP 126/82 | HR 80 | Temp 97.3°F | Ht 62.0 in | Wt 175.6 lb

## 2024-09-21 DIAGNOSIS — K5900 Constipation, unspecified: Secondary | ICD-10-CM | POA: Insufficient documentation

## 2024-09-21 DIAGNOSIS — R140 Abdominal distension (gaseous): Secondary | ICD-10-CM | POA: Diagnosis not present

## 2024-09-21 DIAGNOSIS — K5909 Other constipation: Secondary | ICD-10-CM | POA: Diagnosis not present

## 2024-09-21 DIAGNOSIS — K638219 Small intestinal bacterial overgrowth, unspecified: Secondary | ICD-10-CM

## 2024-09-21 MED ORDER — NA SULFATE-K SULFATE-MG SULF 17.5-3.13-1.6 GM/177ML PO SOLN: 1.0000 | Freq: Once | ORAL | 0 refills | Status: AC

## 2024-09-21 MED ORDER — LUBIPROSTONE 24 MCG PO CAPS: 24.0000 ug | ORAL_CAPSULE | Freq: Two times a day (BID) | ORAL | 2 refills | Status: AC

## 2024-09-21 NOTE — Patient Instructions (Signed)
-  proceed with SIBO/SIMO breath test  as scheduled  -abd xray  -referral for anorectal manometry -stop linzess  -will discuss other therapies for constipation after xray results  Follow up 3 months  It was a pleasure to see you today. I want to create trusting relationships with patients and provide genuine, compassionate, and quality care. I truly value your feedback! please be on the lookout for a survey regarding your visit with me today. I appreciate your input about our visit and your time in completing this!    Author Hatlestad L. Eliana Lueth, MSN, APRN, AGNP-C Adult-Gerontology Nurse Practitioner [facility] at [facility]

## 2024-09-21 NOTE — Progress Notes (Addendum)
 Referring Provider: Cook, Jayce G, DO Primary Care Physician:  Cook, Jayce G, DO Primary GI Physician: Dr. Eartha   Chief Complaint  Patient presents with   Irritable Bowel Syndrome    Pt arrives due to recent flare up of IBS. Last week pt had bad episode;unable to use bathroom and swelled up. Pt took 6 laxatives-stomach distended and hard as a brick and unable to go to bathroom. Pt she has watery bowel movements. Has appt with [facility] 11/08/24   HPI:   [name] is a 68 y.o. female with past medical history of  IBS-C, anxiety, depression, GERD, hypertension, hypothyroidism, asthma, recurrent SIBO    Patient presenting today for:  Follow up of abdominal bloating/distention, constipation  History of SIBO  Last seen August, at that time did course of cipro , improved for about 3 days then started having bloating again. Having issues with hemorrhoids, using prep H. Not taking linzess , motegrity  too expensive.   Recommended increase fiber, start linzess , preparation H PRN, consider anorectal manometry if constipation persists, proceed with SIMO testing if recurring bloating.  Patient called in September requesting cipro  due to bloating, Rx provided, was referred to [facility] for SIBO/SIMO testing which is scheduled for 11/08/24  Present:  States she felt better for a short time after cipro  course in September. She notes that more recently she became constipated and very bloated with her abdomen feeling very uncomfortable and distended. She took 6 laxatives over the course of 1-2 days, she had a lot of abdominal cramping but continued with watery stools and no real improvement in symptoms. She notes that she had some vomiting as well at that time. She is taking 290mcg daily of linzess , she uses miralax  PRN, will sometimes take 4oz of miralax  in 64 oz of gatorade. Not having nausea or vomiting now. Weight is stable, appetite is good. No rectal bleeding or melena. She notes all she passes is  watery/thinner stools but no significant/solid stools. Does not seem to matter what she eats. Passing minimal flatulence.    Last EGD: 06/03/2020 - Normal hypopharynx. - Normal esophagus. - Z-line irregular, 36 cm from the incisors. - Erythematous mucosa in the antrum. Biopsied. - Normal duodenal bulb and second portion of the duodenum. Biopsied Last Colonoscopy: 06/03/2020  -  Diverticulosis in the sigmoid colon. - External hemorrhoids. - No specimens collected. Negative celiac disease   Past Medical History:  Diagnosis Date   Anxiety    Arthritis    Depression    GERD (gastroesophageal reflux disease)    Hypertension    Hypothyroid    Hypothyroidism    Moderate persistent asthma 01/20/2021   PONV (postoperative nausea and vomiting)    Recurrent upper respiratory infection (URI)    Urticaria     Past Surgical History:  Procedure Laterality Date   BACK SURGERY     BIOPSY  06/03/2020   Procedure: BIOPSY;  Surgeon: Golda Claudis PENNER, MD;  [facility];  Service: Endoscopy;;   CARPAL TUNNEL RELEASE  06/16/2012   Procedure: CARPAL TUNNEL RELEASE;  Surgeon: Elsie Mussel, MD;  Location: [facility];  Service: Orthopedics;  Laterality: Right;  limited open carpal tunnel release   CERVICAL FUSION  2018   CHOLECYSTECTOMY     COLONOSCOPY     COLONOSCOPY WITH PROPOFOL  N/A 06/03/2020   Procedure: COLONOSCOPY WITH PROPOFOL ;  Surgeon: Golda Claudis PENNER, MD;  [facility];  Service: Endoscopy;  Laterality: N/A;  115   ESOPHAGOGASTRODUODENOSCOPY (EGD)  WITH PROPOFOL  N/A 06/03/2020   Procedure: ESOPHAGOGASTRODUODENOSCOPY (EGD) WITH PROPOFOL ;  Surgeon: Golda Claudis PENNER, MD;  Location: [facility];  Service: Endoscopy;  Laterality: N/A;   TONSILLECTOMY     TRIGGER FINGER RELEASE  06/16/2012   Procedure: RELEASE TRIGGER FINGER/A-1 PULLEY;  Surgeon: Elsie Mussel, MD;  Location: [facility];  Service: Orthopedics;  Laterality: Right;  right middle  a-1 release   TUBAL LIGATION     UPPER GASTROINTESTINAL ENDOSCOPY     VAGINAL HYSTERECTOMY      Current Outpatient Medications  Medication Sig Dispense Refill   azelastine  (ASTELIN ) 0.1 % nasal spray Place 1 spray into both nostrils 2 (two) times daily. Use in each nostril as directed 30 mL 2   budesonide -formoterol  (SYMBICORT ) 80-4.5 MCG/ACT inhaler 2 puff every 12 hours (Patient taking differently: 2 puff every 12 hours prn) 1 each 12   buPROPion  (WELLBUTRIN  XL) 300 MG 24 hr tablet Take 1 tablet (300 mg total) by mouth daily. 90 tablet 0   cetirizine  (ZYRTEC  ALLERGY) 10 MG tablet Take 1 tablet (10 mg total) by mouth daily. 30 tablet 2   clobetasol  cream (TEMOVATE ) 0.05 % Apply 1 Application topically 2 (two) times daily. 30 g 11   famotidine  (PEPCID ) 20 MG tablet One after supper 90 tablet 1   Fezolinetant  (VEOZAH ) 45 MG TABS Take 1 tablet (45 mg total) by mouth at bedtime. 30 tablet 11   fluticasone  (FLONASE ) 50 MCG/ACT nasal spray Place 2 sprays into both nostrils daily. 16 g 5   fluticasone -salmeterol (ADVAIR HFA) 45-21 MCG/ACT inhaler Inhale 2 puffs into the lungs 2 (two) times daily. take 2 puffs first thing in am and then another 2 puffs about 12 hours later. (Patient taking differently: Inhale 2 puffs into the lungs as needed. take 2 puffs first thing in am and then another 2 puffs about 12 hours later.) 1 each 12   gabapentin  (NEURONTIN ) 300 MG capsule Take 1 capsule (300 mg total) by mouth at bedtime as needed (back pain). 90 capsule 1   levothyroxine  (SYNTHROID ) 137 MCG tablet TAKE (1) TABLET BY MOUTH ONCE DAILY BEFORE BREAKFAST. 90 tablet 1   linaclotide  (LINZESS ) 290 MCG CAPS capsule Take 1 capsule (290 mcg total) by mouth daily before breakfast. 90 capsule 3   losartan  (COZAAR ) 100 MG tablet TAKE (1) TABLET BY MOUTH ONCE DAILY. 90 tablet 3   meclizine  (ANTIVERT ) 25 MG tablet Take 1 tablet (25 mg total) by mouth 3 (three) times daily as needed for dizziness. 30 tablet 0    montelukast  (SINGULAIR ) 10 MG tablet Take 1 tablet (10 mg total) by mouth at bedtime. 30 tablet 5   nystatin  powder Apply 1 Application topically 3 (three) times daily.     ondansetron  (ZOFRAN -ODT) 4 MG disintegrating tablet Take 1 tablet (4 mg total) by mouth every 8 (eight) hours as needed for nausea or vomiting. 20 tablet 0   polyethylene glycol powder (GLYCOLAX /MIRALAX ) 17 GM/SCOOP powder Take 17 g by mouth daily. 507 g 5   rosuvastatin  (CRESTOR ) 20 MG tablet TAKE ONE TABLET BY MOUTH ONCE DAILY. 90 tablet 3   saline (AYR) GEL Place 1 application into both nostrils daily as needed (dryness).     sertraline  (ZOLOFT ) 100 MG tablet TAKE 1 AND 1/2 TABLETS BY MOUTH ONCE DAILY. 135 tablet 1   Spacer/Aero-Holding Chambers DEVI 1 Device by Does not apply route as directed. 1 each 1   triamcinolone  0.025%-Cerave equivalent 1:1 cream mixture Apply topically 2 (two) times daily  as needed. 454 g 0   valACYclovir  (VALTREX ) 1000 MG tablet Take 2 tabs po now then repeat dose in 12 hours for fever blisters 20 tablet 6   No current facility-administered medications for this visit.    Allergies as of 09/21/2024 - Review Complete 09/21/2024  Allergen Reaction Noted   Penicillins Hives 04/20/2012    Social History   Socioeconomic History   Marital status: Married    Spouse name: Not on file   Number of children: Not on file   Years of education: 12   Highest education level: 12th grade  Occupational History   Not on file  Tobacco Use   Smoking status: Former    Current packs/day: 0.00    Average packs/day: 1.5 packs/day for 27.0 years (40.5 ttl pk-yrs)    Types: Cigarettes    Start date: 58    Quit date: 2000    Years since quitting: 25.8   Smokeless tobacco: Never  Vaping Use   Vaping status: Never Used  Substance and Sexual Activity   Alcohol use: No   Drug use: No   Sexual activity: Not on file  Other Topics Concern   Not on file  Social History Narrative   Not on file   Social  Drivers of Health   Financial Resource Strain: Low Risk  (06/23/2024)   Overall Financial Resource Strain (CARDIA)    Difficulty of Paying Living Expenses: Not hard at all  Food Insecurity: No Food Insecurity (06/23/2024)   Hunger Vital Sign    Worried About Running Out of Food in the Last Year: Never true    Ran Out of Food in the Last Year: Never true  Transportation Needs: No Transportation Needs (06/23/2024)   PRAPARE - Administrator, Civil Service (Medical): No    Lack of Transportation (Non-Medical): No  Physical Activity: Insufficiently Active (06/23/2024)   Exercise Vital Sign    Days of Exercise per Week: 3 days    Minutes of Exercise per Session: 30 min  Stress: No Stress Concern Present (06/23/2024)   [employer] of Occupational Health - Occupational Stress Questionnaire    Feeling of Stress: Not at all  Social Connections: Socially Integrated (06/23/2024)   Social Connection and Isolation Panel    Frequency of Communication with Friends and Family: Twice a week    Frequency of Social Gatherings with Friends and Family: Once a week    Attends Religious Services: 1 to 4 times per year    Active Member of [employer] or Organizations: Yes    Attends Banker Meetings: 1 to 4 times per year    Marital Status: Married    Review of systems General: negative for malaise, night sweats, fever, chills, weight loss Neck: Negative for lumps, goiter, pain and significant neck swelling Resp: Negative for cough, wheezing, dyspnea at rest CV: Negative for chest pain, leg swelling, palpitations, orthopnea GI: denies melena, hematochezia, nausea, vomiting, diarrhea, dysphagia, odyonophagia, early satiety or unintentional weight loss. +constipation +abdominal bloating/distention  MSK: Negative for joint pain or swelling, back pain, and muscle pain. Derm: Negative for itching or rash Psych: Denies depression, anxiety, memory loss, confusion. No homicidal or suicidal  ideation.  Heme: Negative for prolonged bleeding, bruising easily, and swollen nodes. Endocrine: Negative for cold or heat intolerance, polyuria, polydipsia and goiter. Neuro: negative for tremor, gait imbalance, syncope and seizures. The remainder of the review of systems is noncontributory.  Physical Exam: There were no vitals taken for this  visit. General:   Alert and oriented. No distress noted. Pleasant and cooperative.  Head:  Normocephalic and atraumatic. Eyes:  Conjuctiva clear without scleral icterus. Mouth:  Oral mucosa pink and moist. Good dentition. No lesions. Heart: Normal rate and rhythm, s1 and s2 heart sounds present.  Lungs: Clear lung sounds in all lobes. Respirations equal and unlabored. Abdomen:  abdomen is full/distended, but soft, bowel sounds present. Some mild TTP of general lower abdomen. No rebound or guarding. No HSM or masses noted. Derm: No palmar erythema or jaundice Msk:  Symmetrical without gross deformities. Normal posture. Extremities:  Without edema. Neurologic:  Alert and  oriented x4 Psych:  Alert and cooperative. Normal mood and affect.  Invalid input(s): 6 MONTHS   ASSESSMENT: [name] is a 68 y.o. female presenting today for follow up of abdominal distention/bloating, constipation and history of SIBO  Patient with multi year history of constipation and initial SIBO diagnosis via breath testing back in the 90s, has been treated intermittently with courses of cipro /xifaxan  in 2012.  prescribed xifaxan  previously which was not covered, therefore did a course of cipro  which she felt provided some relief for a few days but constipation and bloating returned shortly thereafter. She had another course of cipro  in September with similar pattern. could not afford motegrity  prescribed at previous visit, was started on linzess  290mcg daily which she feels no improvement from.  Notes that bloating improves if she is able to move her bowels but overall  has not had sufficient BM in some time. Takes Miralax  PRN. No nausea or vomiting currently and her appetite and weight are stable. She is scheduled for SIBO/SIMO breath testing in December at [facility]. Recent TSH in may was WNL, previously hypothyroidism so this is likely not contributing. SIMO could certainly be precipitating much of her symptomatology but her constipation seems unresponsive to multiple laxative type agents. I did discuss anorectal manometry with her as well, initially planned to await breath testing results however patient requests to be referred for this now which I think is reasonable. Given her abdominal distention and ongoing constipation today, will obtain abdominal Xray to evaluate stool burden, rule out any partial obstruction though I feel this is less likely given no nausea/vomiting and good appetite. Will stop linzess  and likely start amitiza  but will see what xray shows first as we may need to do a bowel cleanse if there is a large stool burden.    PLAN:  -proceed with SIBO/SIMO breath test in December  -abd xray STAT  -referral for anorectal manometry -stop linzess  -consider bowel prep pending xray findings if large stool burden present -will likely start amitiza  24mcg BID pending xray results   All questions were answered, patient verbalized understanding and is in agreement with plan as outlined above.   Follow Up: 3 months   Alaena Strader L. Mariette, MSN, APRN, AGNP-C Adult-Gerontology Nurse Practitioner [facility]  I have reviewed the note and agree with the APP's assessment as described in this progress note  Toribio Fortune, MD Gastroenterology and Hepatology [facility]

## 2024-09-22 ENCOUNTER — Telehealth (INDEPENDENT_AMBULATORY_CARE_PROVIDER_SITE_OTHER): Payer: Self-pay

## 2024-09-22 NOTE — Telephone Encounter (Signed)
 We have samples of Clen piq here in the office.

## 2024-09-22 NOTE — Telephone Encounter (Signed)
 Patient called today to report she can not afford the Lubiprostone , which cost $100.00, nor can she take the Suprep, as this makes her deathly ill, she also says the Suprep cost was $ 45.00. She would like to try something cheaper than the Lubiprostone , and would like a prep that is in a pill form as she says the other makes her too sick.   Patient uses [facility].  Please advise.

## 2024-09-25 ENCOUNTER — Other Ambulatory Visit (INDEPENDENT_AMBULATORY_CARE_PROVIDER_SITE_OTHER): Payer: Self-pay | Admitting: Gastroenterology

## 2024-09-25 NOTE — Telephone Encounter (Signed)
 I called the patient regarding the Clenpiq sample. She reports she went ahead and bought the Suprep and will use this.

## 2024-09-25 NOTE — Telephone Encounter (Signed)
 Per Mitzie, drink one bottle of clenpiq followed by 40 oz of clear liquid, then repeat this with the additional bottle the next day.

## 2024-09-25 NOTE — Telephone Encounter (Signed)
 I spoke with the patient and she says she picked up the Lubiprostone  from the pharmacy and she will try this. I made her aware if she does not tolerate it, or if it does not work for her to please let us  know.

## 2024-09-27 ENCOUNTER — Other Ambulatory Visit (INDEPENDENT_AMBULATORY_CARE_PROVIDER_SITE_OTHER): Payer: Self-pay

## 2024-09-27 NOTE — Telephone Encounter (Signed)
 Patient calling back today asking how she is supposed to take the prep you sent in, as the directions are for someone about to have a procedure.(Suprep bowel Kit). Please advise.

## 2024-09-27 NOTE — Telephone Encounter (Signed)
 I called and the patient did not answer. I got the Voice mail, I left her a message stating I would be sending the directions through her My Chart and if she has any questions she can respond through there or call the office.

## 2024-10-05 ENCOUNTER — Other Ambulatory Visit: Payer: Self-pay | Admitting: Family Medicine

## 2024-11-06 ENCOUNTER — Ambulatory Visit: Payer: Self-pay

## 2024-11-06 NOTE — Telephone Encounter (Signed)
 Husband states he didn't want the patient called but someone can call him back at his number [phone]  FYI Only or Action Required?: Action required by provider: clinical question for provider and update on patient condition.  Patient was last seen in primary care on 06/05/2024 by Cook, Jayce G, DO.  Called Nurse Triage reporting Depression.  Symptoms began a year ago but worse in the past few weeks per husband.  Triage Disposition: See PCP When Office is Open (Within 3 Days)  Patient/caregiver understands and will follow disposition?: No, wishes to speak with PCP         Copied from CRM #[id number]. Topic: Clinical - Red Word Triage >> Nov 06, 2024  1:26 PM Suzen RAMAN wrote: Red Word that prompted transfer to Nurse Triage: depression; patient spouse on the line not sure what to do or what type of guidance Dr. Bluford could provide Reason for Disposition  Substance use (drug use) or misuse, known or suspected  Answer Assessment - Initial Assessment Questions Patient's husband called and advised that he believes that the patient is going through depression and he would like her to talk to someone He states that she has been forgetful and she doesn't understand where she is sometimes or forget directions to somewhere she's been going to her whole life Symptoms over the past year but he states the past few weeks it has seemed He states that patient has not had any injuries or hit her head that he is aware of Patient has an appointment this week after getting a flu shot in her left arm--this arm has been bother patient for two weeks since that shot Husband denies her being in any danger to herself or anyone else Husband states that the patient is with him right now at a car dealership trying to get some things straight at this time with a new car that he just purchased for her. This RN inquired about speaking with the patient and he initially asked for someone to call the patient later  around 4pm but then changed his mind and didn't want someone to call her.   This RN advised patient of [facility], Crisis Number [phone], and if anything gets worse to take her to the Emergency Room. Patient's husband states that someone can call him back but he states he was just going to bring her up to the office to talk to someone  He states that he is not worried about the patient hurting herself or anyone else and he just wanted her to talk to someone to get out of this funk that she is in. This RN advised him to call us  back at any point with any further questions or concerns. Husband states he didn't want the patient called but someone can call him back at his number [phone]  Protocols used: Depression-A-AH, Information Only Call - No Triage-A-AH

## 2024-11-06 NOTE — Telephone Encounter (Signed)
 Attempted to call [facility] again to advise of patient's situation--no answer at [facility] and no answer at [facility]

## 2024-11-06 NOTE — Telephone Encounter (Signed)
 Called [facility] to advise them of patient's symptoms and husband's concerns---No answer

## 2024-11-08 DIAGNOSIS — M25512 Pain in left shoulder: Secondary | ICD-10-CM | POA: Diagnosis not present

## 2024-11-08 DIAGNOSIS — Z4789 Encounter for other orthopedic aftercare: Secondary | ICD-10-CM | POA: Diagnosis not present

## 2024-11-08 DIAGNOSIS — M7542 Impingement syndrome of left shoulder: Secondary | ICD-10-CM | POA: Diagnosis not present

## 2024-11-08 DIAGNOSIS — M7532 Calcific tendinitis of left shoulder: Secondary | ICD-10-CM | POA: Diagnosis not present

## 2024-11-15 ENCOUNTER — Ambulatory Visit: Admitting: Family Medicine

## 2024-11-15 VITALS — BP 118/63 | HR 87 | Temp 98.1°F | Ht 62.0 in | Wt 174.0 lb

## 2024-11-15 MED ORDER — ARIPIPRAZOLE 2 MG PO TABS: 2.0000 mg | ORAL_TABLET | Freq: Every day | ORAL | 1 refills | Status: AC

## 2024-11-15 MED ORDER — BUPROPION HCL ER (XL) 150 MG PO TB24: ORAL_TABLET | ORAL | 0 refills | Status: DC

## 2024-11-15 NOTE — Patient Instructions (Signed)
 Wellbutrin  as directed.  Adding abilify.  Follow up in 1 month.

## 2024-11-16 DIAGNOSIS — F331 Major depressive disorder, recurrent, moderate: Secondary | ICD-10-CM | POA: Insufficient documentation

## 2024-11-16 NOTE — Assessment & Plan Note (Signed)
 Uncontrolled and worsening.  Suggested counseling.  Decreasing Wellbutrin .  Adding Abilify.

## 2024-11-16 NOTE — Progress Notes (Signed)
 Subjective:  Patient ID: [name], female    DOB: [birth year]  Age: 68 y.o. MRN: [id number]  CC:   Chief Complaint  Patient presents with   Anxiety    Patient is here for having severe anxiety and depression for the past 6 months.  Has no filter. Gets snappy and irritated easily    HPI:  68 year old female presents with multiple complaints.  Patient has longstanding anxiety and depression.  She states that over the past 6 months she feels like she has been getting worse.  She has little motivation or interest in doing things.  She is very irritable and often snaps at her husband.  She is easily agitated.  Feels quite anxious as well.  Additionally, she states that she is tearful often.  She states that she does not feel like her normal self.  She is currently on Wellbutrin  and Zoloft .  She states that she has been on Zoloft  for many years.  Flowsheet Row Office Visit from 11/15/2024 in [facility]  PHQ-9 Total Score 14      11/15/2024    3:11 PM 06/05/2024    4:02 PM 05/02/2024    3:05 PM 12/20/2023    4:33 PM  GAD 7 : Generalized Anxiety Score  Nervous, Anxious, on Edge 2 1 2 2   Control/stop worrying 2 2 2  0  Worry too much - different things 2 2 2  0  Trouble relaxing 3 3 2 2   Restless 3 2 2 2   Easily annoyed or irritable 3 2 3  0  Afraid - awful might happen 0 0 0 0  Total GAD 7 Score 15 12 13 6   Anxiety Difficulty Somewhat difficult Somewhat difficult Somewhat difficult Somewhat difficult    Patient Active Problem List   Diagnosis Date Noted   Major depressive disorder, recurrent episode, moderate with anxious distress (HCC) 11/16/2024   Vertigo 06/05/2024   Chronic asthma, mild intermittent, uncomplicated 07/29/2023   Insomnia 03/23/2023   Obesity (BMI 30-39.9) 01/15/2023   Primary osteoarthritis involving multiple joints 05/15/2022   CAD (coronary artery disease) 03/04/2022   Healthcare maintenance 09/18/2021   Irritable bowel  syndrome with constipation 07/31/2021   Prediabetes 12/13/2017   Cervical spondylolysis 07/28/2017   Lumbar spondylosis 07/28/2017   Hyperlipidemia 12/04/2016   Acne rosacea 03/07/2014   Gastroesophageal reflux disease 09/19/2012   Small intestinal bacterial overgrowth (SIBO) 07/25/2012   Essential hypertension 07/25/2012   Hypothyroidism 07/25/2012   Constipation 07/25/2012    Social Hx   Social History   Socioeconomic History   Marital status: Married    Spouse name: Not on file   Number of children: Not on file   Years of education: 12   Highest education level: 12th grade  Occupational History   Not on file  Tobacco Use   Smoking status: Former    Current packs/day: 0.00    Average packs/day: 1.5 packs/day for 27.0 years (40.5 ttl pk-yrs)    Types: Cigarettes    Start date: 23    Quit date: 2000    Years since quitting: 25.9   Smokeless tobacco: Never  Vaping Use   Vaping status: Never Used  Substance and Sexual Activity   Alcohol use: No   Drug use: No   Sexual activity: Not on file  Other Topics Concern   Not on file  Social History Narrative   Not on file   Social Drivers of Health   Tobacco Use: Medium Risk (09/21/2024)  Patient History    Smoking Tobacco Use: Former    Smokeless Tobacco Use: Never    Passive Exposure: Not on file  Financial Resource Strain: Low Risk (06/23/2024)   Overall Financial Resource Strain (CARDIA)    Difficulty of Paying Living Expenses: Not hard at all  Food Insecurity: No Food Insecurity (06/23/2024)   Epic    Worried About Programme Researcher, Broadcasting/film/video in the Last Year: Never true    Ran Out of Food in the Last Year: Never true  Transportation Needs: No Transportation Needs (06/23/2024)   Epic    Lack of Transportation (Medical): No    Lack of Transportation (Non-Medical): No  Physical Activity: Insufficiently Active (06/23/2024)   Exercise Vital Sign    Days of Exercise per Week: 3 days    Minutes of Exercise per Session: 30  min  Stress: No Stress Concern Present (06/23/2024)   [employer] of Occupational Health - Occupational Stress Questionnaire    Feeling of Stress: Not at all  Social Connections: Socially Integrated (06/23/2024)   Social Connection and Isolation Panel    Frequency of Communication with Friends and Family: Twice a week    Frequency of Social Gatherings with Friends and Family: Once a week    Attends Religious Services: 1 to 4 times per year    Active Member of Clubs or Organizations: Yes    Attends Banker Meetings: 1 to 4 times per year    Marital Status: Married  Depression (PHQ2-9): High Risk (11/15/2024)   Depression (PHQ2-9)    PHQ-2 Score: 14  Alcohol Screen: Low Risk (06/23/2024)   Alcohol Screen    Last Alcohol Screening Score (AUDIT): 0  Housing: Unknown (06/23/2024)   Epic    Unable to Pay for Housing in the Last Year: No    Number of Times Moved in the Last Year: Not on file    Homeless in the Last Year: No  Utilities: Not At Risk (06/23/2024)   Epic    Threatened with loss of utilities: No  Health Literacy: Adequate Health Literacy (06/23/2024)   B1300 Health Literacy    Frequency of need for help with medical instructions: Never    Review of Systems Per HPI  Objective:  BP 118/63 (BP Location: Left Arm, Patient Position: Sitting)   Pulse 87   Temp 98.1 F (36.7 C)   Ht 5' 2 (1.575 m)   Wt 174 lb (78.9 kg)   SpO2 91%   BMI 31.83 kg/m      11/15/2024    2:56 PM 09/21/2024   11:56 AM 08/10/2024    2:59 PM  BP/Weight  Systolic BP 118 126 138  Diastolic BP 63 82 73  Wt. (Lbs) 174 175.6 175  BMI 31.83 kg/m2 32.12 kg/m2 32.01 kg/m2    Physical Exam Constitutional:      General: She is not in acute distress.    Appearance: Normal appearance.  HENT:     Head: Normocephalic and atraumatic.  Cardiovascular:     Rate and Rhythm: Normal rate and regular rhythm.  Pulmonary:     Effort: Pulmonary effort is normal.     Breath sounds: Normal  breath sounds.  Neurological:     Mental Status: She is alert.  Psychiatric:     Comments: Anxious.     Lab Results  Component Value Date   WBC 6.7 12/21/2023   HGB 14.8 12/21/2023   HCT 45.1 12/21/2023   PLT 194 12/21/2023   GLUCOSE  97 12/21/2023   CHOL 152 12/21/2023   TRIG 128 12/21/2023   HDL 58 12/21/2023   LDLCALC 72 12/21/2023   ALT 24 12/21/2023   AST 21 12/21/2023   NA 141 12/21/2023   K 4.6 12/21/2023   CL 103 12/21/2023   CREATININE 0.96 12/21/2023   BUN 12 12/21/2023   CO2 23 12/21/2023   TSH 0.682 05/02/2024   INR 0.9 10/21/2007   HGBA1C 5.8 (H) 06/16/2021     Assessment & Plan:  Major depressive disorder, recurrent episode, moderate with anxious distress (HCC) Assessment & Plan: Uncontrolled and worsening.  Suggested counseling.  Decreasing Wellbutrin .  Adding Abilify.   Other orders -     buPROPion  HCl ER (XL); Daily for 2 weeks then every other day for 2 weeks then discontinue.  Dispense: 21 tablet; Refill: 0 -     ARIPiprazole; Take 1 tablet (2 mg total) by mouth daily.  Dispense: 90 tablet; Refill: 1    Follow-up: 1 month  Tremar Wickens DO [facility]

## 2024-11-17 ENCOUNTER — Telehealth: Payer: Self-pay

## 2024-11-17 NOTE — Telephone Encounter (Signed)
 Left message to return call

## 2024-11-17 NOTE — Telephone Encounter (Signed)
 [name]   11/17/2024  4:49 PM  Patient returning call - she is being weaned off Wellbutrin , does she start Abilify now or wait til she is weaned off Wellbutrin - [phone]

## 2024-11-17 NOTE — Telephone Encounter (Unsigned)
 Copied from CRM #[id number]. Topic: Clinical - Medication Question >> Nov 17, 2024 10:56 AM Avram MATSU wrote: Reason for CRM: patient has questions about her medication ARIPiprazole (ABILIFY) 2 MG tablet [[id number]], please advise [phone]

## 2024-11-20 NOTE — Telephone Encounter (Signed)
 Left message to return call

## 2024-11-21 NOTE — Telephone Encounter (Signed)
 Copied from CRM #[id number]. Topic: General - Other >> Nov 20, 2024  5:06 PM Delon DASEN wrote: Reason for CRM: Gave patient message from Dr Bluford to start Abilify 

## 2024-12-18 ENCOUNTER — Ambulatory Visit (INDEPENDENT_AMBULATORY_CARE_PROVIDER_SITE_OTHER): Admitting: Family Medicine

## 2024-12-18 ENCOUNTER — Ambulatory Visit: Admitting: Family Medicine

## 2024-12-18 DIAGNOSIS — F331 Major depressive disorder, recurrent, moderate: Secondary | ICD-10-CM | POA: Diagnosis not present

## 2024-12-18 DIAGNOSIS — M150 Primary generalized (osteo)arthritis: Secondary | ICD-10-CM | POA: Diagnosis not present

## 2024-12-18 DIAGNOSIS — I10 Essential (primary) hypertension: Secondary | ICD-10-CM

## 2024-12-18 MED ORDER — MELOXICAM 15 MG PO TABS: 15.0000 mg | ORAL_TABLET | Freq: Every day | ORAL | 1 refills | Status: AC | PRN

## 2024-12-18 NOTE — Assessment & Plan Note (Signed)
 Stable. Continue losartan

## 2024-12-18 NOTE — Assessment & Plan Note (Signed)
Meloxicam as directed

## 2024-12-18 NOTE — Assessment & Plan Note (Signed)
 Doing much better.  Continue Abilify  and Zoloft .

## 2024-12-18 NOTE — Progress Notes (Signed)
 "  Subjective:  Patient ID: [name], female    DOB: 01-Nov-1956  Age: 69 y.o. MRN: [id number]  CC:   Chief Complaint  Patient presents with   left shoulder pain     Refill meloxicam  15 mg /90 day    HPI:  69 year old female presents for follow up.  Has had a significant improvement in depression and anxiety with addition of Abilify . She states she is doing much better!  Patient reports that meloxicam  helps her significantly regarding joint pain/osteoarthritis.  She would like refill today.  Hypertension stable.  Patient Active Problem List   Diagnosis Date Noted   Major depressive disorder, recurrent episode, moderate with anxious distress (HCC) 11/16/2024   Vertigo 06/05/2024   Chronic asthma, mild intermittent, uncomplicated 07/29/2023   Insomnia 03/23/2023   Obesity (BMI 30-39.9) 01/15/2023   Primary osteoarthritis involving multiple joints 05/15/2022   CAD (coronary artery disease) 03/04/2022   Healthcare maintenance 09/18/2021   Irritable bowel syndrome with constipation 07/31/2021   Prediabetes 12/13/2017   Cervical spondylolysis 07/28/2017   Lumbar spondylosis 07/28/2017   Hyperlipidemia 12/04/2016   Acne rosacea 03/07/2014   Gastroesophageal reflux disease 09/19/2012   Small intestinal bacterial overgrowth (SIBO) 07/25/2012   Essential hypertension 07/25/2012   Hypothyroidism 07/25/2012   Constipation 07/25/2012    Social Hx   Social History   Socioeconomic History   Marital status: Married    Spouse name: Not on file   Number of children: Not on file   Years of education: 12   Highest education level: 12th grade  Occupational History   Not on file  Tobacco Use   Smoking status: Former    Current packs/day: 0.00    Average packs/day: 1.5 packs/day for 27.0 years (40.5 ttl pk-yrs)    Types: Cigarettes    Start date: 2    Quit date: 2000    Years since quitting: 26.0   Smokeless tobacco: Never  Vaping Use   Vaping status: Never Used   Substance and Sexual Activity   Alcohol use: No   Drug use: No   Sexual activity: Not on file  Other Topics Concern   Not on file  Social History Narrative   Not on file   Social Drivers of Health   Tobacco Use: Medium Risk (09/21/2024)   Patient History    Smoking Tobacco Use: Former    Smokeless Tobacco Use: Never    Passive Exposure: Not on Actuary Strain: Low Risk (06/23/2024)   Overall Financial Resource Strain (CARDIA)    Difficulty of Paying Living Expenses: Not hard at all  Food Insecurity: No Food Insecurity (06/23/2024)   Epic    Worried About Programme Researcher, Broadcasting/film/video in the Last Year: Never true    [employer] of Food in the Last Year: Never true  Transportation Needs: No Transportation Needs (06/23/2024)   Epic    Lack of Transportation (Medical): No    Lack of Transportation (Non-Medical): No  Physical Activity: Insufficiently Active (06/23/2024)   Exercise Vital Sign    Days of Exercise per Week: 3 days    Minutes of Exercise per Session: 30 min  Stress: No Stress Concern Present (06/23/2024)   [employer] of Occupational Health - Occupational Stress Questionnaire    Feeling of Stress: Not at all  Social Connections: Socially Integrated (06/23/2024)   Social Connection and Isolation Panel    Frequency of Communication with Friends and Family: Twice a week  Frequency of Social Gatherings with Friends and Family: Once a week    Attends Religious Services: 1 to 4 times per year    Active Member of Clubs or Organizations: Yes    Attends Banker Meetings: 1 to 4 times per year    Marital Status: Married  Depression (PHQ2-9): Medium Risk (12/18/2024)   Depression (PHQ2-9)    PHQ-2 Score: 10  Alcohol Screen: Low Risk (06/23/2024)   Alcohol Screen    Last Alcohol Screening Score (AUDIT): 0  Housing: Unknown (06/23/2024)   Epic    Unable to Pay for Housing in the Last Year: No    Number of Times Moved in the Last Year: Not on file     Homeless in the Last Year: No  Utilities: Not At Risk (06/23/2024)   Epic    Threatened with loss of utilities: No  Health Literacy: Adequate Health Literacy (06/23/2024)   B1300 Health Literacy    Frequency of need for help with medical instructions: Never    Review of Systems Per HPI  Objective:  BP 128/79   Pulse 83   Temp 98.1 F (36.7 C)   Ht 5' 2 (1.575 m)   Wt 176 lb (79.8 kg)   SpO2 97%   BMI 32.19 kg/m      12/18/2024    3:42 PM 11/15/2024    2:56 PM 09/21/2024   11:56 AM  BP/Weight  Systolic BP 128 118 126  Diastolic BP 79 63 82  Wt. (Lbs) 176 174 175.6  BMI 32.19 kg/m2 31.83 kg/m2 32.12 kg/m2    Physical Exam Vitals and nursing note reviewed.  Constitutional:      General: She is not in acute distress.    Appearance: Normal appearance.  HENT:     Head: Normocephalic and atraumatic.  Cardiovascular:     Rate and Rhythm: Normal rate and regular rhythm.  Pulmonary:     Effort: Pulmonary effort is normal.     Breath sounds: Normal breath sounds.  Neurological:     Mental Status: She is alert.  Psychiatric:        Mood and Affect: Mood normal.        Behavior: Behavior normal.     Lab Results  Component Value Date   WBC 6.7 12/21/2023   HGB 14.8 12/21/2023   HCT 45.1 12/21/2023   PLT 194 12/21/2023   GLUCOSE 97 12/21/2023   CHOL 152 12/21/2023   TRIG 128 12/21/2023   HDL 58 12/21/2023   LDLCALC 72 12/21/2023   ALT 24 12/21/2023   AST 21 12/21/2023   NA 141 12/21/2023   K 4.6 12/21/2023   CL 103 12/21/2023   CREATININE 0.96 12/21/2023   BUN 12 12/21/2023   CO2 23 12/21/2023   TSH 0.682 05/02/2024   INR 0.9 10/21/2007   HGBA1C 5.8 (H) 06/16/2021     Assessment & Plan:  Essential hypertension Assessment & Plan: Stable.  Continue losartan ..   Major depressive disorder, recurrent episode, moderate with anxious distress (HCC) Assessment & Plan: Doing much better.  Continue Abilify  and Zoloft .   Primary osteoarthritis involving  multiple joints Assessment & Plan: Meloxicam  as directed.  Orders: -     Meloxicam ; Take 1 tablet (15 mg total) by mouth daily as needed for pain.  Dispense: 90 tablet; Refill: 1    Follow-up:  6 months  Fotini Lemus Bluford DO [facility] "

## 2024-12-18 NOTE — Patient Instructions (Signed)
Follow up in 6 months.  Take care  Dr. Wren Gallaga  

## 2025-01-08 ENCOUNTER — Ambulatory Visit: Payer: Self-pay

## 2025-01-08 NOTE — Telephone Encounter (Signed)
 Attempted to call patient to see how she was feeling. Someone would answer but would not speak. Tried twice

## 2025-01-09 ENCOUNTER — Ambulatory Visit: Admitting: Family Medicine

## 2025-01-11 ENCOUNTER — Ambulatory Visit: Admitting: Family Medicine

## 2025-01-11 ENCOUNTER — Ambulatory Visit: Payer: Self-pay

## 2025-01-11 VITALS — BP 143/83 | HR 73 | Temp 97.9°F | Ht 62.0 in | Wt 178.0 lb

## 2025-01-11 DIAGNOSIS — E038 Other specified hypothyroidism: Secondary | ICD-10-CM

## 2025-01-11 DIAGNOSIS — R42 Dizziness and giddiness: Secondary | ICD-10-CM

## 2025-01-11 MED ORDER — ONDANSETRON 4 MG PO TBDP: 4.0000 mg | ORAL_TABLET | Freq: Three times a day (TID) | ORAL | 0 refills | Status: AC | PRN

## 2025-01-11 MED ORDER — MECLIZINE HCL 25 MG PO TABS: 25.0000 mg | ORAL_TABLET | Freq: Three times a day (TID) | ORAL | 0 refills | Status: AC | PRN

## 2025-01-11 MED ORDER — DOXYCYCLINE HYCLATE 100 MG PO TABS: 100.0000 mg | ORAL_TABLET | Freq: Two times a day (BID) | ORAL | 0 refills | Status: AC

## 2025-01-11 NOTE — Patient Instructions (Signed)
 Referral placed.  Medications as prescribed.  Take care  Dr. Bluford

## 2025-01-11 NOTE — Telephone Encounter (Signed)
" °  FYI Only or Action Required?: FYI only for provider: appointment scheduled on 02.05.26.  Patient was last seen in primary care on 12/18/2024 by Cook, Jayce G, DO.  Called Nurse Triage reporting Dizziness.  Symptoms began several weeks ago.  Interventions attempted: Prescription medications: Meclizine .  Symptoms are: gradually worsening.  Triage Disposition: See PCP When Office is Open (Within 3 Days)  Patient/caregiver understands and will follow disposition?: Yes   Message from [facility] sent at 01/11/2025 11:57 AM EST  Reason for Triage: Vertigo/Dizziness seeking ENT referral. Nausea, believes her sinuses are contributing to this. Severe dizziness she says   Reason for Disposition  [1] MODERATE dizziness (e.g., vertigo; feels very unsteady, interferes with normal activities) AND [2] has been evaluated by doctor (or NP/PA) for this  Answer Assessment - Initial Assessment Questions 1. DESCRIPTION: Describe your dizziness.     Dizziness  2. VERTIGO: Do you feel like either you or the room is spinning or tilting?      Hx of Vertigo  3. LIGHTHEADED: Do you feel lightheaded? (e.g., somewhat faint, woozy, weak upon standing)      Yes, lightheaded  4. SEVERITY: How bad is it?  Can you walk?      Can still walk, needs to take a break   5. ONSET:  When did the dizziness begin?      3 weeks comes and goes  6. AGGRAVATING FACTORS: Does anything make it worse? (e.g., standing, change in head position)      Changing positions and moving head worsens symptoms  7. CAUSE: What do you think is causing the dizziness?     Hx of Vertigo  8. RECURRENT SYMPTOM: Have you had dizziness before? If Yes, ask: When was the last time? What happened that time?     Yes, recurrent  9. OTHER SYMPTOMS: Do you have any other symptoms? (e.g., earache, numbness, tinnitus, vomiting, weakness)  Sinus  and nausea, ear and eye on right size feels heavy  Pt reports dizziness Pt  is taking Meclizine  Pt scheduled for a visit on  02.05.26 for further evaluation. Pt requesting referral to ENT Pt agrees with plan of care, will call back for any worsening symptoms  Protocols used: Dizziness - Vertigo-A-AH  "

## 2025-01-12 LAB — TSH: TSH: 0.325 u[IU]/mL — ABNORMAL LOW (ref 0.450–4.500)

## 2025-01-19 ENCOUNTER — Ambulatory Visit (HOSPITAL_COMMUNITY)

## 2025-06-29 ENCOUNTER — Ambulatory Visit
